# Patient Record
Sex: Female | Born: 1945 | Race: White | Hispanic: No | Smoking: Never smoker
Health system: Southern US, Community
[De-identification: ages and names within clinical notes are randomized; demographics above are authoritative.]

## PROBLEM LIST (undated history)

## (undated) ENCOUNTER — Encounter (HOSPITAL_COMMUNITY): Payer: Self-pay

## (undated) ENCOUNTER — Ambulatory Visit (HOSPITAL_COMMUNITY): Payer: Self-pay | Admitting: Cardiovascular Disease

## (undated) DIAGNOSIS — M8580 Other specified disorders of bone density and structure, unspecified site: Secondary | ICD-10-CM

## (undated) DIAGNOSIS — F329 Major depressive disorder, single episode, unspecified: Secondary | ICD-10-CM

## (undated) DIAGNOSIS — T7840XA Allergy, unspecified, initial encounter: Secondary | ICD-10-CM

## (undated) DIAGNOSIS — E785 Hyperlipidemia, unspecified: Secondary | ICD-10-CM

## (undated) DIAGNOSIS — E213 Hyperparathyroidism, unspecified: Secondary | ICD-10-CM

## (undated) DIAGNOSIS — D649 Anemia, unspecified: Secondary | ICD-10-CM

## (undated) DIAGNOSIS — E559 Vitamin D deficiency, unspecified: Secondary | ICD-10-CM

## (undated) DIAGNOSIS — I2510 Atherosclerotic heart disease of native coronary artery without angina pectoris: Secondary | ICD-10-CM

## (undated) DIAGNOSIS — Z87442 Personal history of urinary calculi: Secondary | ICD-10-CM

## (undated) DIAGNOSIS — I10 Essential (primary) hypertension: Secondary | ICD-10-CM

## (undated) DIAGNOSIS — F319 Bipolar disorder, unspecified: Secondary | ICD-10-CM

## (undated) DIAGNOSIS — F411 Generalized anxiety disorder: Secondary | ICD-10-CM

## (undated) DIAGNOSIS — E079 Disorder of thyroid, unspecified: Secondary | ICD-10-CM

## (undated) DIAGNOSIS — J45909 Unspecified asthma, uncomplicated: Secondary | ICD-10-CM

## (undated) DIAGNOSIS — F32A Depression, unspecified: Secondary | ICD-10-CM

## (undated) DIAGNOSIS — D351 Benign neoplasm of parathyroid gland: Secondary | ICD-10-CM

## (undated) DIAGNOSIS — Z5189 Encounter for other specified aftercare: Secondary | ICD-10-CM

## (undated) DIAGNOSIS — G43909 Migraine, unspecified, not intractable, without status migrainosus: Secondary | ICD-10-CM

## (undated) DIAGNOSIS — I350 Nonrheumatic aortic (valve) stenosis: Secondary | ICD-10-CM

## (undated) DIAGNOSIS — K635 Polyp of colon: Secondary | ICD-10-CM

## (undated) DIAGNOSIS — K219 Gastro-esophageal reflux disease without esophagitis: Secondary | ICD-10-CM

## (undated) DIAGNOSIS — B54 Unspecified malaria: Secondary | ICD-10-CM

## (undated) DIAGNOSIS — J189 Pneumonia, unspecified organism: Secondary | ICD-10-CM

## (undated) DIAGNOSIS — F419 Anxiety disorder, unspecified: Secondary | ICD-10-CM

## (undated) DIAGNOSIS — R0609 Other forms of dyspnea: Secondary | ICD-10-CM

## (undated) DIAGNOSIS — I5189 Other ill-defined heart diseases: Secondary | ICD-10-CM

## (undated) DIAGNOSIS — G3184 Mild cognitive impairment, so stated: Secondary | ICD-10-CM

## (undated) DIAGNOSIS — M349 Systemic sclerosis, unspecified: Secondary | ICD-10-CM

## (undated) DIAGNOSIS — M810 Age-related osteoporosis without current pathological fracture: Secondary | ICD-10-CM

## (undated) HISTORY — DX: Disorder of thyroid, unspecified: E07.9

## (undated) HISTORY — DX: Bipolar disorder, unspecified: F31.9

## (undated) HISTORY — DX: Benign neoplasm of parathyroid gland: D35.1

## (undated) HISTORY — DX: Hyperlipidemia, unspecified: E78.5

## (undated) HISTORY — DX: Nonrheumatic aortic (valve) stenosis: I35.0

## (undated) HISTORY — DX: Generalized anxiety disorder: F41.1

## (undated) HISTORY — DX: Allergy, unspecified, initial encounter: T78.40XA

## (undated) HISTORY — DX: Migraine, unspecified, not intractable, without status migrainosus: G43.909

## (undated) HISTORY — DX: Anemia, unspecified: D64.9

## (undated) HISTORY — DX: Depression, unspecified: F32.A

## (undated) HISTORY — DX: Polyp of colon: K63.5

## (undated) HISTORY — DX: Essential (primary) hypertension: I10

## (undated) HISTORY — DX: Encounter for other specified aftercare: Z51.89

## (undated) HISTORY — DX: Major depressive disorder, single episode, unspecified: F32.9

## (undated) HISTORY — DX: Unspecified asthma, uncomplicated: J45.909

## (undated) HISTORY — DX: Vitamin D deficiency, unspecified: E55.9

## (undated) HISTORY — DX: Atherosclerotic heart disease of native coronary artery without angina pectoris: I25.10

## (undated) HISTORY — DX: Hyperparathyroidism, unspecified: E21.3

## (undated) HISTORY — DX: Other specified disorders of bone density and structure, unspecified site: M85.80

## (undated) HISTORY — DX: Personal history of urinary calculi: Z87.442

## (undated) HISTORY — DX: Mild cognitive impairment of uncertain or unknown etiology: G31.84

## (undated) HISTORY — PX: DILATION AND CURETTAGE OF UTERUS: SHX78

## (undated) HISTORY — DX: Gastro-esophageal reflux disease without esophagitis: K21.9

## (undated) HISTORY — DX: Anxiety disorder, unspecified: F41.9

## (undated) HISTORY — DX: Pneumonia, unspecified organism: J18.9

## (undated) HISTORY — DX: Age-related osteoporosis without current pathological fracture: M81.0

## (undated) HISTORY — DX: Systemic sclerosis, unspecified: M34.9

## (undated) HISTORY — DX: Unspecified malaria: B54

## (undated) HISTORY — DX: Other forms of dyspnea: R06.09

## (undated) HISTORY — DX: Other ill-defined heart diseases: I51.89

## (undated) SURGERY — CARDIAC CATHETERIZATION
Anesthesia: Surgeon Has No Preference | Site: Coronary

## (undated) SURGERY — CARDIAC CATHETERIZATION
Anesthesia: Local | Site: Coronary

## (undated) MED ORDER — METOPROLOL SUCCINATE ER 25 MG OR TB24
EXTENDED_RELEASE_TABLET | ORAL | 0 refills | Status: AC
Start: 2022-01-17 — End: ?

## (undated) MED ORDER — METOPROLOL SUCCINATE ER 50 MG OR TB24
EXTENDED_RELEASE_TABLET | ORAL | 2 refills | Status: AC
Start: 2020-10-20 — End: ?

## (undated) MED ORDER — METOPROLOL SUCCINATE ER 50 MG OR TB24
EXTENDED_RELEASE_TABLET | ORAL | 0 refills | Status: AC
Start: 2022-01-17 — End: ?

## (undated) MED ORDER — AMLODIPINE BESYLATE 10 MG OR TABS
ORAL_TABLET | ORAL | 0 refills | Status: AC
Start: 2022-01-17 — End: ?

## (undated) MED ORDER — AMLODIPINE BESYLATE 10 MG OR TABS
ORAL_TABLET | ORAL | 0 refills | Status: AC
Start: 2022-01-01 — End: ?

## (undated) MED ORDER — PANTOPRAZOLE SODIUM 40 MG OR TBEC
DELAYED_RELEASE_TABLET | ORAL | 0 refills | Status: AC
Start: 2022-03-09 — End: ?

## (undated) MED ORDER — NITROGLYCERIN 0.4 MG SL SUBL
SUBLINGUAL_TABLET | SUBLINGUAL | 0 refills | Status: AC
Start: 2022-01-17 — End: ?

## (undated) MED ORDER — ISOSORBIDE MONONITRATE ER 60 MG OR TB24
EXTENDED_RELEASE_TABLET | ORAL | 0 refills | Status: AC
Start: 2022-01-17 — End: ?

---

## 1971-06-12 DIAGNOSIS — S36113A Laceration of liver, unspecified degree, initial encounter: Secondary | ICD-10-CM

## 1971-06-12 HISTORY — PX: LIVER REPAIR: SHX6734

## 1971-06-12 HISTORY — DX: Laceration of liver, unspecified degree, initial encounter: S36.113A

## 1971-06-12 HISTORY — PX: PR MGMT LVR HEMRRG SMPL SUTR LVR WND/INJ: 47350

## 2004-06-11 DIAGNOSIS — Z951 Presence of aortocoronary bypass graft: Secondary | ICD-10-CM | POA: Insufficient documentation

## 2004-06-11 DIAGNOSIS — I2510 Atherosclerotic heart disease of native coronary artery without angina pectoris: Secondary | ICD-10-CM

## 2004-06-11 HISTORY — DX: Atherosclerotic heart disease of native coronary artery without angina pectoris: I25.10

## 2004-06-11 HISTORY — PX: CORONARY ARTERY BYPASS GRAFT: SHX141

## 2005-04-11 HISTORY — PX: OTHER SURGICAL HISTORY: SHX169

## 2005-05-01 HISTORY — PX: LEFT HEART CATH AND CORONARY ANGIOGRAPHY: CATH118249

## 2005-05-01 HISTORY — PX: CORONARY ANGIOGRAM: CATH173

## 2005-05-14 ENCOUNTER — Encounter: Payer: Self-pay | Admitting: Cardiology

## 2005-05-16 ENCOUNTER — Encounter: Payer: Self-pay | Admitting: Cardiology

## 2005-05-22 ENCOUNTER — Encounter: Payer: Self-pay | Admitting: Cardiology

## 2014-06-11 HISTORY — PX: UTERINE FIBROID SURGERY: SHX826

## 2015-06-12 DIAGNOSIS — E213 Hyperparathyroidism, unspecified: Secondary | ICD-10-CM

## 2015-06-12 DIAGNOSIS — S92309A Fracture of unspecified metatarsal bone(s), unspecified foot, initial encounter for closed fracture: Secondary | ICD-10-CM

## 2015-06-12 DIAGNOSIS — Z8781 Personal history of (healed) traumatic fracture: Secondary | ICD-10-CM | POA: Insufficient documentation

## 2015-06-12 HISTORY — DX: Hyperparathyroidism, unspecified: E21.3

## 2015-06-12 HISTORY — DX: Fracture of unspecified metatarsal bone(s), unspecified foot, initial encounter for closed fracture: S92.309A

## 2015-12-10 DIAGNOSIS — I350 Nonrheumatic aortic (valve) stenosis: Secondary | ICD-10-CM

## 2015-12-10 DIAGNOSIS — Z8639 Personal history of other endocrine, nutritional and metabolic disease: Secondary | ICD-10-CM | POA: Insufficient documentation

## 2015-12-10 DIAGNOSIS — Z86018 Personal history of other benign neoplasm: Secondary | ICD-10-CM | POA: Insufficient documentation

## 2015-12-10 HISTORY — DX: Nonrheumatic aortic (valve) stenosis: I35.0

## 2015-12-10 HISTORY — PX: TRANSTHORACIC ECHOCARDIOGRAM: SHX275

## 2016-01-05 DIAGNOSIS — F062 Psychotic disorder with delusions due to known physiological condition: Secondary | ICD-10-CM | POA: Insufficient documentation

## 2016-01-05 DIAGNOSIS — I25119 Atherosclerotic heart disease of native coronary artery with unspecified angina pectoris: Secondary | ICD-10-CM | POA: Insufficient documentation

## 2016-01-05 DIAGNOSIS — I350 Nonrheumatic aortic (valve) stenosis: Secondary | ICD-10-CM | POA: Insufficient documentation

## 2016-01-05 DIAGNOSIS — F3170 Bipolar disorder, currently in remission, most recent episode unspecified: Secondary | ICD-10-CM | POA: Insufficient documentation

## 2016-01-06 DIAGNOSIS — D351 Benign neoplasm of parathyroid gland: Secondary | ICD-10-CM

## 2016-01-06 HISTORY — DX: Benign neoplasm of parathyroid gland: D35.1

## 2016-01-08 DIAGNOSIS — I350 Nonrheumatic aortic (valve) stenosis: Secondary | ICD-10-CM

## 2016-01-08 DIAGNOSIS — I351 Nonrheumatic aortic (valve) insufficiency: Secondary | ICD-10-CM | POA: Insufficient documentation

## 2016-01-08 HISTORY — DX: Nonrheumatic aortic (valve) insufficiency: I35.1

## 2016-01-08 HISTORY — DX: Nonrheumatic aortic (valve) stenosis: I35.0

## 2016-01-09 DIAGNOSIS — G3184 Mild cognitive impairment, so stated: Secondary | ICD-10-CM | POA: Insufficient documentation

## 2016-01-09 DIAGNOSIS — D351 Benign neoplasm of parathyroid gland: Secondary | ICD-10-CM

## 2016-01-09 DIAGNOSIS — M349 Systemic sclerosis, unspecified: Secondary | ICD-10-CM | POA: Insufficient documentation

## 2016-01-09 DIAGNOSIS — N201 Calculus of ureter: Secondary | ICD-10-CM

## 2016-01-09 HISTORY — DX: Benign neoplasm of parathyroid gland: D35.1

## 2016-01-09 HISTORY — DX: Calculus of ureter: N20.1

## 2016-01-12 ENCOUNTER — Encounter (HOSPITAL_BASED_OUTPATIENT_CLINIC_OR_DEPARTMENT_OTHER): Payer: Medicare Other | Admitting: Otolaryngology

## 2016-01-25 DIAGNOSIS — Z8639 Personal history of other endocrine, nutritional and metabolic disease: Secondary | ICD-10-CM | POA: Insufficient documentation

## 2016-01-27 DIAGNOSIS — Z87442 Personal history of urinary calculi: Secondary | ICD-10-CM | POA: Insufficient documentation

## 2016-01-27 DIAGNOSIS — I351 Nonrheumatic aortic (valve) insufficiency: Secondary | ICD-10-CM | POA: Insufficient documentation

## 2016-01-27 DIAGNOSIS — E559 Vitamin D deficiency, unspecified: Secondary | ICD-10-CM | POA: Insufficient documentation

## 2016-01-27 HISTORY — DX: Vitamin D deficiency, unspecified: E55.9

## 2016-02-07 DIAGNOSIS — E892 Postprocedural hypoparathyroidism: Secondary | ICD-10-CM | POA: Insufficient documentation

## 2016-02-07 DIAGNOSIS — Z9889 Other specified postprocedural states: Secondary | ICD-10-CM | POA: Insufficient documentation

## 2016-02-07 HISTORY — PX: PARATHYROIDECTOMY: SHX19

## 2016-02-07 HISTORY — PX: PARATHYROIDECTOMY: SHX5138

## 2016-04-11 HISTORY — PX: NM MYOVIEW LTD: HXRAD82

## 2016-06-18 DIAGNOSIS — I2510 Atherosclerotic heart disease of native coronary artery without angina pectoris: Secondary | ICD-10-CM | POA: Diagnosis not present

## 2016-06-18 DIAGNOSIS — I10 Essential (primary) hypertension: Secondary | ICD-10-CM | POA: Diagnosis not present

## 2016-06-18 DIAGNOSIS — I350 Nonrheumatic aortic (valve) stenosis: Secondary | ICD-10-CM | POA: Diagnosis not present

## 2016-06-18 DIAGNOSIS — I351 Nonrheumatic aortic (valve) insufficiency: Secondary | ICD-10-CM | POA: Diagnosis not present

## 2016-06-18 DIAGNOSIS — E785 Hyperlipidemia, unspecified: Secondary | ICD-10-CM | POA: Diagnosis not present

## 2016-08-17 DIAGNOSIS — E785 Hyperlipidemia, unspecified: Secondary | ICD-10-CM | POA: Diagnosis not present

## 2016-08-17 DIAGNOSIS — E892 Postprocedural hypoparathyroidism: Secondary | ICD-10-CM | POA: Diagnosis not present

## 2016-08-17 DIAGNOSIS — Z8639 Personal history of other endocrine, nutritional and metabolic disease: Secondary | ICD-10-CM | POA: Diagnosis not present

## 2016-09-27 DIAGNOSIS — M349 Systemic sclerosis, unspecified: Secondary | ICD-10-CM | POA: Diagnosis not present

## 2016-09-27 DIAGNOSIS — I10 Essential (primary) hypertension: Secondary | ICD-10-CM | POA: Diagnosis not present

## 2016-09-27 DIAGNOSIS — Z5181 Encounter for therapeutic drug level monitoring: Secondary | ICD-10-CM | POA: Diagnosis not present

## 2016-09-27 DIAGNOSIS — R69 Illness, unspecified: Secondary | ICD-10-CM | POA: Diagnosis not present

## 2016-09-27 DIAGNOSIS — E892 Postprocedural hypoparathyroidism: Secondary | ICD-10-CM | POA: Diagnosis not present

## 2016-09-27 DIAGNOSIS — I2510 Atherosclerotic heart disease of native coronary artery without angina pectoris: Secondary | ICD-10-CM | POA: Diagnosis not present

## 2016-10-03 DIAGNOSIS — I2510 Atherosclerotic heart disease of native coronary artery without angina pectoris: Secondary | ICD-10-CM | POA: Diagnosis not present

## 2016-10-03 DIAGNOSIS — Z0000 Encounter for general adult medical examination without abnormal findings: Secondary | ICD-10-CM | POA: Diagnosis not present

## 2016-10-03 DIAGNOSIS — Z1211 Encounter for screening for malignant neoplasm of colon: Secondary | ICD-10-CM | POA: Diagnosis not present

## 2016-10-03 DIAGNOSIS — Z1231 Encounter for screening mammogram for malignant neoplasm of breast: Secondary | ICD-10-CM | POA: Diagnosis not present

## 2016-10-03 DIAGNOSIS — R69 Illness, unspecified: Secondary | ICD-10-CM | POA: Diagnosis not present

## 2016-10-03 DIAGNOSIS — I10 Essential (primary) hypertension: Secondary | ICD-10-CM | POA: Diagnosis not present

## 2016-10-03 DIAGNOSIS — S82891G Other fracture of right lower leg, subsequent encounter for closed fracture with delayed healing: Secondary | ICD-10-CM | POA: Diagnosis not present

## 2016-10-15 ENCOUNTER — Other Ambulatory Visit: Payer: Self-pay | Admitting: Internal Medicine

## 2016-10-15 DIAGNOSIS — R5381 Other malaise: Secondary | ICD-10-CM

## 2016-10-15 DIAGNOSIS — Z1231 Encounter for screening mammogram for malignant neoplasm of breast: Secondary | ICD-10-CM

## 2016-10-22 ENCOUNTER — Other Ambulatory Visit: Payer: Self-pay | Admitting: Internal Medicine

## 2016-10-22 DIAGNOSIS — E213 Hyperparathyroidism, unspecified: Secondary | ICD-10-CM

## 2016-11-16 ENCOUNTER — Ambulatory Visit (INDEPENDENT_AMBULATORY_CARE_PROVIDER_SITE_OTHER)
Admission: RE | Admit: 2016-11-16 | Discharge: 2016-11-16 | Disposition: A | Discharge status: Home / Self Care | Payer: Medicare HMO | Source: Ambulatory Visit | Attending: Nurse Practitioner | Admitting: Nurse Practitioner

## 2016-11-16 ENCOUNTER — Encounter: Payer: Self-pay | Admitting: Nurse Practitioner

## 2016-11-16 ENCOUNTER — Ambulatory Visit (INDEPENDENT_AMBULATORY_CARE_PROVIDER_SITE_OTHER): Payer: Medicare HMO | Admitting: Nurse Practitioner

## 2016-11-16 VITALS — BP 142/68 | HR 61 | Temp 98.2°F | Ht 61.0 in | Wt 219.0 lb

## 2016-11-16 DIAGNOSIS — R0602 Shortness of breath: Secondary | ICD-10-CM

## 2016-11-16 DIAGNOSIS — R059 Cough, unspecified: Secondary | ICD-10-CM

## 2016-11-16 DIAGNOSIS — Z9089 Acquired absence of other organs: Secondary | ICD-10-CM | POA: Insufficient documentation

## 2016-11-16 DIAGNOSIS — Z9889 Other specified postprocedural states: Secondary | ICD-10-CM | POA: Insufficient documentation

## 2016-11-16 DIAGNOSIS — J0140 Acute pansinusitis, unspecified: Secondary | ICD-10-CM | POA: Diagnosis not present

## 2016-11-16 DIAGNOSIS — R05 Cough: Secondary | ICD-10-CM

## 2016-11-16 DIAGNOSIS — I10 Essential (primary) hypertension: Secondary | ICD-10-CM | POA: Insufficient documentation

## 2016-11-16 DIAGNOSIS — E785 Hyperlipidemia, unspecified: Secondary | ICD-10-CM | POA: Insufficient documentation

## 2016-11-16 DIAGNOSIS — E892 Postprocedural hypoparathyroidism: Secondary | ICD-10-CM | POA: Insufficient documentation

## 2016-11-16 MED ORDER — BENZONATATE 100 MG PO CAPS: 100.0000 mg | ORAL_CAPSULE | Freq: Three times a day (TID) | ORAL | 0 refills | Status: DC | PRN

## 2016-11-16 MED ORDER — ALBUTEROL SULFATE HFA 108 (90 BASE) MCG/ACT IN AERS: 2.0000 | INHALATION_SPRAY | Freq: Four times a day (QID) | RESPIRATORY_TRACT | 0 refills | Status: DC | PRN

## 2016-11-16 MED ORDER — FLUTICASONE PROPIONATE 50 MCG/ACT NA SUSP: 2.0000 | Freq: Every day | NASAL | 0 refills | Status: DC

## 2016-11-16 MED ORDER — DM-GUAIFENESIN ER 30-600 MG PO TB12: 1.0000 | ORAL_TABLET | Freq: Two times a day (BID) | ORAL | 0 refills | Status: DC | PRN

## 2016-11-16 NOTE — Patient Instructions (Addendum)
Please go to basement for blood draw. You will be called with results.  URI Instructions: Flonase and Afrin use: apply 1spray of afrin in each nare, wait 69mins, then apply 2sprays of flonase in each nare. Use both nasal spray consecutively x 3days, then flonase only for at least 14days.  Encourage adequate oral hydration.  Use over-the-counter  "cold" medicines  such as "Tylenol cold" , "Advil cold",  "Mucinex" or" Mucinex D"  for cough and congestion.  Avoid decongestants if you have high blood pressure. Use" Delsym" or" Robitussin" cough syrup varietis for cough.  You can use plain "Tylenol" or "Advi"l for fever, chills and achyness.

## 2016-11-16 NOTE — Progress Notes (Signed)
Subjective:  Patient ID: Cindy Reyes, female    DOB: August 17, 1945  Age: 71 y.o. MRN: [id number]  CC: Establish Care (new pt/coughing,wheezing,SOB for 2 wks. pt had parathyroid surgery FYI)   Cough  This is a new problem. The current episode started 1 to 4 weeks ago. The problem has been waxing and waning. The cough is productive of purulent sputum and productive of sputum. Associated symptoms include chest pain, chills, a fever, nasal congestion, postnasal drip, rhinorrhea, a sore throat, shortness of breath and wheezing. The symptoms are aggravated by lying down and cold air. She has tried OTC cough suppressant for the symptoms. The treatment provided no relief. Her past medical history is significant for environmental allergies.   No outpatient prescriptions prior to visit.   No facility-administered medications prior to visit.     ROS See HPI  Objective:  BP (!) 142/68   Pulse 61   Temp 98.2 F (36.8 C)   Ht 5\' 1"  (1.549 m)   Wt 219 lb (99.3 kg)   SpO2 98%   BMI 41.38 kg/m   BP Readings from Last 3 Encounters:  11/16/16 (!) 142/68    Wt Readings from Last 3 Encounters:  11/16/16 219 lb (99.3 kg)    Physical Exam  Constitutional: She is oriented to person, place, and time.  HENT:  Right Ear: Tympanic membrane, external ear and ear canal normal.  Left Ear: Tympanic membrane, external ear and ear canal normal.  Nose: Mucosal edema and rhinorrhea present. Right sinus exhibits maxillary sinus tenderness. Right sinus exhibits no frontal sinus tenderness. Left sinus exhibits maxillary sinus tenderness. Left sinus exhibits no frontal sinus tenderness.  Mouth/Throat: Uvula is midline. No trismus in the jaw. Posterior oropharyngeal erythema present. No oropharyngeal exudate.  Eyes: No scleral icterus.  Neck: Normal range of motion. Neck supple.  Cardiovascular: Normal rate and normal heart sounds.   Pulmonary/Chest: Effort normal and breath sounds normal.  Musculoskeletal:  She exhibits no edema.  Lymphadenopathy:    She has cervical adenopathy.  Neurological: She is alert and oriented to person, place, and time.  Vitals reviewed.   No results found for: WBC, HGB, HCT, PLT, GLUCOSE, CHOL, TRIG, HDL, LDLDIRECT, LDLCALC, ALT, AST, NA, K, CL, CREATININE, BUN, CO2, TSH, PSA, INR, GLUF, HGBA1C, MICROALBUR  Patient was never admitted.  Assessment & Plan:   Cindy Reyes was seen today for establish care.  Diagnoses and all orders for this visit:  Cough -     DG Chest 2 View; Future -     benzonatate (TESSALON) 100 MG capsule; Take 1 capsule (100 mg total) by mouth 3 (three) times daily as needed for cough. -     dextromethorphan-guaiFENesin (MUCINEX DM) 30-600 MG 12hr tablet; Take 1 tablet by mouth 2 (two) times daily as needed for cough. -     albuterol (PROVENTIL HFA;VENTOLIN HFA) 108 (90 Base) MCG/ACT inhaler; Inhale 2 puffs into the lungs every 6 (six) hours as needed for wheezing or shortness of breath.  SOB (shortness of breath) -     DG Chest 2 View; Future -     albuterol (PROVENTIL HFA;VENTOLIN HFA) 108 (90 Base) MCG/ACT inhaler; Inhale 2 puffs into the lungs every 6 (six) hours as needed for wheezing or shortness of breath.  Acute non-recurrent pansinusitis -     benzonatate (TESSALON) 100 MG capsule; Take 1 capsule (100 mg total) by mouth 3 (three) times daily as needed for cough. -     dextromethorphan-guaiFENesin [facility] DM)  30-600 MG 12hr tablet; Take 1 tablet by mouth 2 (two) times daily as needed for cough. -     fluticasone (FLONASE) 50 MCG/ACT nasal spray; Place 2 sprays into both nostrils daily.   I am having Cindy Reyes start on benzonatate, dextromethorphan-guaiFENesin, fluticasone, and albuterol. I am also having her maintain her aspirin EC, rosuvastatin, amLODipine, and metoprolol succinate.  Meds ordered this encounter  Medications  . aspirin EC 81 MG tablet    Sig: Take 81 mg by mouth.  . rosuvastatin (CRESTOR) 40 MG tablet    Sig:  Take 40 mg by mouth daily.    Refill:  9  . amLODipine (NORVASC) 10 MG tablet    Sig: Take 10 mg by mouth daily.    Refill:  6  . metoprolol succinate (TOPROL-XL) 25 MG 24 hr tablet    Sig: Take 25 mg by mouth daily.  . benzonatate (TESSALON) 100 MG capsule    Sig: Take 1 capsule (100 mg total) by mouth 3 (three) times daily as needed for cough.    Dispense:  20 capsule    Refill:  0    Order Specific Question:   Supervising Provider    Answer:   Cassandria Anger [[id number]]  . dextromethorphan-guaiFENesin (MUCINEX DM) 30-600 MG 12hr tablet    Sig: Take 1 tablet by mouth 2 (two) times daily as needed for cough.    Dispense:  14 tablet    Refill:  0    Order Specific Question:   Supervising Provider    Answer:   Cassandria Anger [[id number]]  . fluticasone (FLONASE) 50 MCG/ACT nasal spray    Sig: Place 2 sprays into both nostrils daily.    Dispense:  16 g    Refill:  0    Order Specific Question:   Supervising Provider    Answer:   Cassandria Anger [[id number]]  . albuterol (PROVENTIL HFA;VENTOLIN HFA) 108 (90 Base) MCG/ACT inhaler    Sig: Inhale 2 puffs into the lungs every 6 (six) hours as needed for wheezing or shortness of breath.    Dispense:  1 Inhaler    Refill:  0    Order Specific Question:   Supervising Provider    Answer:   Cassandria Anger [[id number]]    Follow-up: Return in about 6 months (around 05/18/2017) for HTN (fasting, for labs).  Wilfred Lacy, NP

## 2016-11-26 DIAGNOSIS — M25512 Pain in left shoulder: Secondary | ICD-10-CM | POA: Insufficient documentation

## 2016-11-26 DIAGNOSIS — Z79899 Other long term (current) drug therapy: Secondary | ICD-10-CM | POA: Diagnosis not present

## 2016-11-26 DIAGNOSIS — M25519 Pain in unspecified shoulder: Secondary | ICD-10-CM | POA: Diagnosis not present

## 2016-11-26 DIAGNOSIS — I10 Essential (primary) hypertension: Secondary | ICD-10-CM | POA: Diagnosis not present

## 2016-11-26 DIAGNOSIS — M25551 Pain in right hip: Secondary | ICD-10-CM | POA: Diagnosis not present

## 2016-11-26 DIAGNOSIS — M160 Bilateral primary osteoarthritis of hip: Secondary | ICD-10-CM | POA: Diagnosis not present

## 2016-11-26 DIAGNOSIS — Z7982 Long term (current) use of aspirin: Secondary | ICD-10-CM | POA: Diagnosis not present

## 2016-11-26 DIAGNOSIS — Z880 Allergy status to penicillin: Secondary | ICD-10-CM | POA: Diagnosis not present

## 2016-11-26 DIAGNOSIS — M25511 Pain in right shoulder: Secondary | ICD-10-CM | POA: Insufficient documentation

## 2016-11-26 DIAGNOSIS — E213 Hyperparathyroidism, unspecified: Secondary | ICD-10-CM | POA: Diagnosis not present

## 2016-11-26 DIAGNOSIS — M25552 Pain in left hip: Secondary | ICD-10-CM | POA: Diagnosis not present

## 2016-11-26 DIAGNOSIS — Z951 Presence of aortocoronary bypass graft: Secondary | ICD-10-CM | POA: Diagnosis not present

## 2016-11-26 DIAGNOSIS — I2510 Atherosclerotic heart disease of native coronary artery without angina pectoris: Secondary | ICD-10-CM | POA: Diagnosis not present

## 2016-11-28 ENCOUNTER — Ambulatory Visit
Admission: RE | Admit: 2016-11-28 | Discharge: 2016-11-28 | Disposition: A | Discharge status: Home / Self Care | Payer: Medicare HMO | Source: Ambulatory Visit | Attending: Internal Medicine | Admitting: Internal Medicine

## 2016-11-28 DIAGNOSIS — Z780 Asymptomatic menopausal state: Secondary | ICD-10-CM | POA: Diagnosis not present

## 2016-11-28 DIAGNOSIS — Z1231 Encounter for screening mammogram for malignant neoplasm of breast: Secondary | ICD-10-CM | POA: Diagnosis not present

## 2016-11-28 DIAGNOSIS — M8589 Other specified disorders of bone density and structure, multiple sites: Secondary | ICD-10-CM | POA: Diagnosis not present

## 2016-11-28 DIAGNOSIS — E213 Hyperparathyroidism, unspecified: Secondary | ICD-10-CM

## 2016-12-31 ENCOUNTER — Telehealth: Payer: Self-pay | Admitting: Nurse Practitioner

## 2016-12-31 DIAGNOSIS — J0140 Acute pansinusitis, unspecified: Secondary | ICD-10-CM

## 2016-12-31 MED ORDER — FLUTICASONE PROPIONATE 50 MCG/ACT NA SUSP: 2.0000 | Freq: Every day | NASAL | 1 refills | Status: DC

## 2016-12-31 NOTE — Telephone Encounter (Signed)
rx sent

## 2017-03-06 DIAGNOSIS — R69 Illness, unspecified: Secondary | ICD-10-CM | POA: Diagnosis not present

## 2017-05-17 ENCOUNTER — Ambulatory Visit: Payer: Medicare HMO | Admitting: Nurse Practitioner

## 2017-06-06 ENCOUNTER — Encounter: Payer: Self-pay | Admitting: Gastroenterology

## 2017-06-30 ENCOUNTER — Encounter (HOSPITAL_COMMUNITY): Payer: Self-pay | Admitting: *Deleted

## 2017-06-30 ENCOUNTER — Other Ambulatory Visit: Payer: Self-pay

## 2017-06-30 ENCOUNTER — Emergency Department (HOSPITAL_COMMUNITY)
Admission: EM | Admit: 2017-06-30 | Discharge: 2017-06-30 | Disposition: A | Discharge status: Home / Self Care | Payer: Medicare HMO | Attending: Emergency Medicine | Admitting: Emergency Medicine

## 2017-06-30 DIAGNOSIS — Z91040 Latex allergy status: Secondary | ICD-10-CM | POA: Diagnosis not present

## 2017-06-30 DIAGNOSIS — R440 Auditory hallucinations: Secondary | ICD-10-CM | POA: Insufficient documentation

## 2017-06-30 DIAGNOSIS — Z79899 Other long term (current) drug therapy: Secondary | ICD-10-CM | POA: Diagnosis not present

## 2017-06-30 DIAGNOSIS — J45909 Unspecified asthma, uncomplicated: Secondary | ICD-10-CM | POA: Diagnosis not present

## 2017-06-30 DIAGNOSIS — R69 Illness, unspecified: Secondary | ICD-10-CM | POA: Diagnosis not present

## 2017-06-30 DIAGNOSIS — F329 Major depressive disorder, single episode, unspecified: Secondary | ICD-10-CM | POA: Insufficient documentation

## 2017-06-30 DIAGNOSIS — I10 Essential (primary) hypertension: Secondary | ICD-10-CM | POA: Diagnosis not present

## 2017-06-30 DIAGNOSIS — I2510 Atherosclerotic heart disease of native coronary artery without angina pectoris: Secondary | ICD-10-CM | POA: Diagnosis not present

## 2017-06-30 LAB — BASIC METABOLIC PANEL
Anion gap: 13 (ref 5–15)
BUN: 16 mg/dL (ref 6–20)
CO2: 20 mmol/L — ABNORMAL LOW (ref 22–32)
Calcium: 9.5 mg/dL (ref 8.9–10.3)
Chloride: 109 mmol/L (ref 101–111)
Creatinine, Ser: 0.99 mg/dL (ref 0.44–1.00)
GFR calc Af Amer: >60 mL/min (ref >60)
GFR calc non Af Amer: 56 mL/min — ABNORMAL LOW (ref >60)
Glucose, Bld: 97 mg/dL (ref 65–99)
Potassium: 3.5 mmol/L (ref 3.5–5.1)
Sodium: 142 mmol/L (ref 135–145)

## 2017-06-30 LAB — CBC
HCT: 47.4 % — ABNORMAL HIGH (ref 36.0–46.0)
Hemoglobin: 16.4 g/dL — ABNORMAL HIGH (ref 12.0–15.0)
MCH: 31.3 pg (ref 26.0–34.0)
MCHC: 34.6 g/dL (ref 30.0–36.0)
MCV: 90.5 fL (ref 78.0–100.0)
Platelets: 224 10*3/uL (ref 150–400)
RBC: 5.24 MIL/uL — ABNORMAL HIGH (ref 3.87–5.11)
RDW: 14.5 % (ref 11.5–15.5)
WBC: 6.6 10*3/uL (ref 4.0–10.5)

## 2017-06-30 LAB — URINALYSIS, ROUTINE W REFLEX MICROSCOPIC
BILIRUBIN URINE: NEGATIVE
Glucose, UA: NEGATIVE mg/dL
Ketones, ur: 20 mg/dL — AB
Nitrite: NEGATIVE
PH: 5 (ref 5.0–8.0)
Protein, ur: 100 mg/dL — AB
SPECIFIC GRAVITY, URINE: 1.021 (ref 1.005–1.030)

## 2017-06-30 NOTE — ED Provider Notes (Signed)
The pt has had hallucinations intermittently when she is at home - they don't occur when she is out and about - and she has no other physical sx at this time - she has no suicidal thoughts.  No depression On exam she is well-appearing in no distress, speaks in full sentences with normal coordination and memory, soft abdomen, clear lungs, no edema.  No urinary symptoms or abd ttp.  Reviewed labs, culture urine, discussed this with patient and friend at the bedside with patient's consent, stable for discharge and outpt f/u with mental health provider.  Medical screening examination/treatment/procedure(s) were conducted as a shared visit with non-physician practitioner(s) and myself.  I personally evaluated the patient during the encounter.  Clinical Impression:   Final diagnoses:  Auditory hallucination         Noemi Chapel, MD 07/02/17 [phone]

## 2017-06-30 NOTE — ED Notes (Signed)
Pt and family verbalized understanding of discharge instructions. Pt unable to sign due to computer error.
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
## 2017-06-30 NOTE — Discharge Instructions (Signed)
As we discussed, you need to follow-up with the therapy resources.  Call and arrange for an appointment.   You also need to establish a primary care doctor.  I have provided a referral to the [facility] wellness clinic that you can follow-up with.  At that appointment, they can evaluate the parathyroid hormone that we drew today.  Return to the Emergency Department for any worsening hallucinations, thoughts of wanting to hurt or kill yourself or anybody else, abdominal pain, chest pain, nausea/vomiting or any other worsening or concerning symptoms.

## 2017-06-30 NOTE — ED Triage Notes (Signed)
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  She had one half of her parathyroid removed and had this happen before with same symptoms and it was related to high calcium.  Pt states she is hallucinating and creating things that are not true.  Pt reports she is not sleeping, throat is sore, and having tension headache.  Pt is here with someone.

## 2017-07-01 LAB — PTH, INTACT AND CALCIUM
Calcium, Total (PTH): 9.4 mg/dL (ref 8.7–10.3)
PTH: 36 pg/mL (ref 15–65)

## 2017-07-02 ENCOUNTER — Telehealth: Payer: Self-pay | Admitting: Nurse Practitioner

## 2017-07-02 LAB — URINE CULTURE

## 2017-07-02 NOTE — Telephone Encounter (Signed)
ok 

## 2017-07-02 NOTE — Telephone Encounter (Signed)
Patient would like to transfer from Wilfred Lacy, NP to Dr. Juleen China due to location. Out of courtesy for both providers I will await approval before make the transfer complete and contacting patient to update. Please respond with an approval or denial of transfer.    Copied from [place]. Topic: Appointment Scheduling - Scheduling Inquiry for Clinic >> Jul 02, 2017 12:44 PM Cindy Reyes wrote: Reason for CRM: pt called in and would like to transfer from [place] to [place].  She would like to go closer to home and would like a DO  .   Is this ok?  Please forward to [place] for sch if ok'd?  Best number  [phone]

## 2017-07-02 NOTE — Telephone Encounter (Signed)
"  She would like a DO"   Before I agree, I need to make sure that the patient understands that I practice the same way any other doctor practices. I follow evidence-based guidelines, encourage vaccinations, and will recommend medications when appropriate.   After that conversation and her acknowledgement that she understands, I am okay with the transfer.

## 2017-07-03 NOTE — Telephone Encounter (Signed)
Patient is scheduled for NP appointment 08/16/17 and voiced understanding of Cindy Reyes's Practices.

## 2017-07-03 NOTE — Telephone Encounter (Signed)
Please call patient to advise of Dr. Alcario Drought note and schedule a transfer appointment if she agrees.

## 2017-07-10 ENCOUNTER — Ambulatory Visit (AMBULATORY_SURGERY_CENTER): Payer: Self-pay | Admitting: *Deleted

## 2017-07-10 ENCOUNTER — Telehealth: Payer: Self-pay | Admitting: *Deleted

## 2017-07-10 ENCOUNTER — Encounter: Payer: Self-pay | Admitting: Gastroenterology

## 2017-07-10 ENCOUNTER — Other Ambulatory Visit: Payer: Self-pay

## 2017-07-10 VITALS — Ht 61.0 in | Wt 214.0 lb

## 2017-07-10 DIAGNOSIS — Z1211 Encounter for screening for malignant neoplasm of colon: Secondary | ICD-10-CM

## 2017-07-10 MED ORDER — PEG 3350-KCL-NA BICARB-NACL 420 G PO SOLR: 4000.0000 mL | Freq: Once | ORAL | 0 refills | Status: AC

## 2017-07-10 NOTE — Telephone Encounter (Signed)
John,  I saw this lady today in [place] . Please look at her cardiology reports in care everywhere for me. She has a hx of CABG. Mild aortic stenosis and she has an EF that is normal, but please review. She kept saying her heart isnt well - she has not seen cardio in [place] but the reports I could find were in 2017 from [place].  They look good to me.  Just want to be sure she is ok for the [place] .   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 She was stating she would rather have the " twilight sleep medicine ".  I explained to her the differences and told her we use propofol and the reasons why.   She wants to discuss this with you or someone she stated.  Thank you for your time, [name]

## 2017-07-10 NOTE — Progress Notes (Signed)
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

## 2017-07-11 ENCOUNTER — Ambulatory Visit: Payer: Medicare HMO | Attending: Family Medicine | Admitting: Licensed Clinical Social Worker

## 2017-07-11 ENCOUNTER — Encounter: Payer: Self-pay | Admitting: Family Medicine

## 2017-07-11 ENCOUNTER — Ambulatory Visit: Payer: Medicare HMO | Attending: Family Medicine | Admitting: Family Medicine

## 2017-07-11 VITALS — BP 126/79 | HR 78 | Temp 97.4°F | Ht 61.0 in | Wt 215.2 lb

## 2017-07-11 DIAGNOSIS — I10 Essential (primary) hypertension: Secondary | ICD-10-CM | POA: Diagnosis not present

## 2017-07-11 DIAGNOSIS — Z23 Encounter for immunization: Secondary | ICD-10-CM | POA: Diagnosis not present

## 2017-07-11 DIAGNOSIS — R399 Unspecified symptoms and signs involving the genitourinary system: Secondary | ICD-10-CM

## 2017-07-11 DIAGNOSIS — E049 Nontoxic goiter, unspecified: Secondary | ICD-10-CM

## 2017-07-11 DIAGNOSIS — Z79899 Other long term (current) drug therapy: Secondary | ICD-10-CM | POA: Insufficient documentation

## 2017-07-11 DIAGNOSIS — Z8659 Personal history of other mental and behavioral disorders: Secondary | ICD-10-CM | POA: Diagnosis not present

## 2017-07-11 DIAGNOSIS — E213 Hyperparathyroidism, unspecified: Secondary | ICD-10-CM

## 2017-07-11 DIAGNOSIS — Z818 Family history of other mental and behavioral disorders: Secondary | ICD-10-CM

## 2017-07-11 DIAGNOSIS — F419 Anxiety disorder, unspecified: Secondary | ICD-10-CM

## 2017-07-11 DIAGNOSIS — R0981 Nasal congestion: Secondary | ICD-10-CM

## 2017-07-11 DIAGNOSIS — R69 Illness, unspecified: Secondary | ICD-10-CM | POA: Diagnosis not present

## 2017-07-11 LAB — POCT URINALYSIS DIPSTICK
Glucose, UA: NEGATIVE
Ketones, UA: 15
NITRITE UA: NEGATIVE
PROTEIN UA: 30
SPEC GRAV UA: 1.015 (ref 1.010–1.025)
Urobilinogen, UA: 0.2 E.U./dL
pH, UA: 6.5 (ref 5.0–8.0)

## 2017-07-11 MED ORDER — FLUTICASONE PROPIONATE 50 MCG/ACT NA SUSP: 2.0000 | Freq: Every day | NASAL | 0 refills | Status: DC
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
## 2017-07-11 NOTE — Telephone Encounter (Signed)
noted 

## 2017-07-11 NOTE — Telephone Encounter (Signed)
Cindy Reyes,  I spoke with Cindy Reyes discussed the propofol anesthetic, and allayed her fears and concerns. She is cleared for anesthetic care at [facility].  Thanks,  Jenny Reichmann
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
## 2017-07-12 LAB — THYROID PANEL WITH TSH
Free Thyroxine Index: 1.6 (ref 1.2–4.9)
T3 Uptake Ratio: 27 % (ref 24–39)
T4 TOTAL: 5.8 ug/dL (ref 4.5–12.0)
TSH: 3.27 u[IU]/mL (ref 0.450–4.500)

## 2017-07-13 LAB — URINE CULTURE: Organism ID, Bacteria: NO GROWTH

## 2017-07-16 ENCOUNTER — Telehealth: Payer: Self-pay

## 2017-07-16 NOTE — Telephone Encounter (Signed)
-----   Message from Alfonse Spruce, [place] sent at 07/15/2017  1:12 PM EST ----- Thyroid function normal Urine culture was negative for bacteria.

## 2017-07-16 NOTE — Telephone Encounter (Signed)
CMA called patient to inform on lab results.  Patient understood.  
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
## 2017-07-22 ENCOUNTER — Telehealth: Payer: Self-pay | Admitting: Gastroenterology

## 2017-07-22 NOTE — Telephone Encounter (Signed)
Patient states she is very sick and can not do prep or procedure...patient will wait till appt with pcp in March to resch.

## 2017-07-22 NOTE — Telephone Encounter (Signed)
Please charge  

## 2017-07-24 ENCOUNTER — Encounter: Payer: Medicare HMO | Admitting: Gastroenterology

## 2017-07-28 IMAGING — DX DG CHEST 2V
2 series · 2 of 2 positions shown · non-contrast
Comparison: None.

CLINICAL DATA: Dry cough.  Shortness of breath

EXAM:
CHEST  2 VIEW

[chest pa]
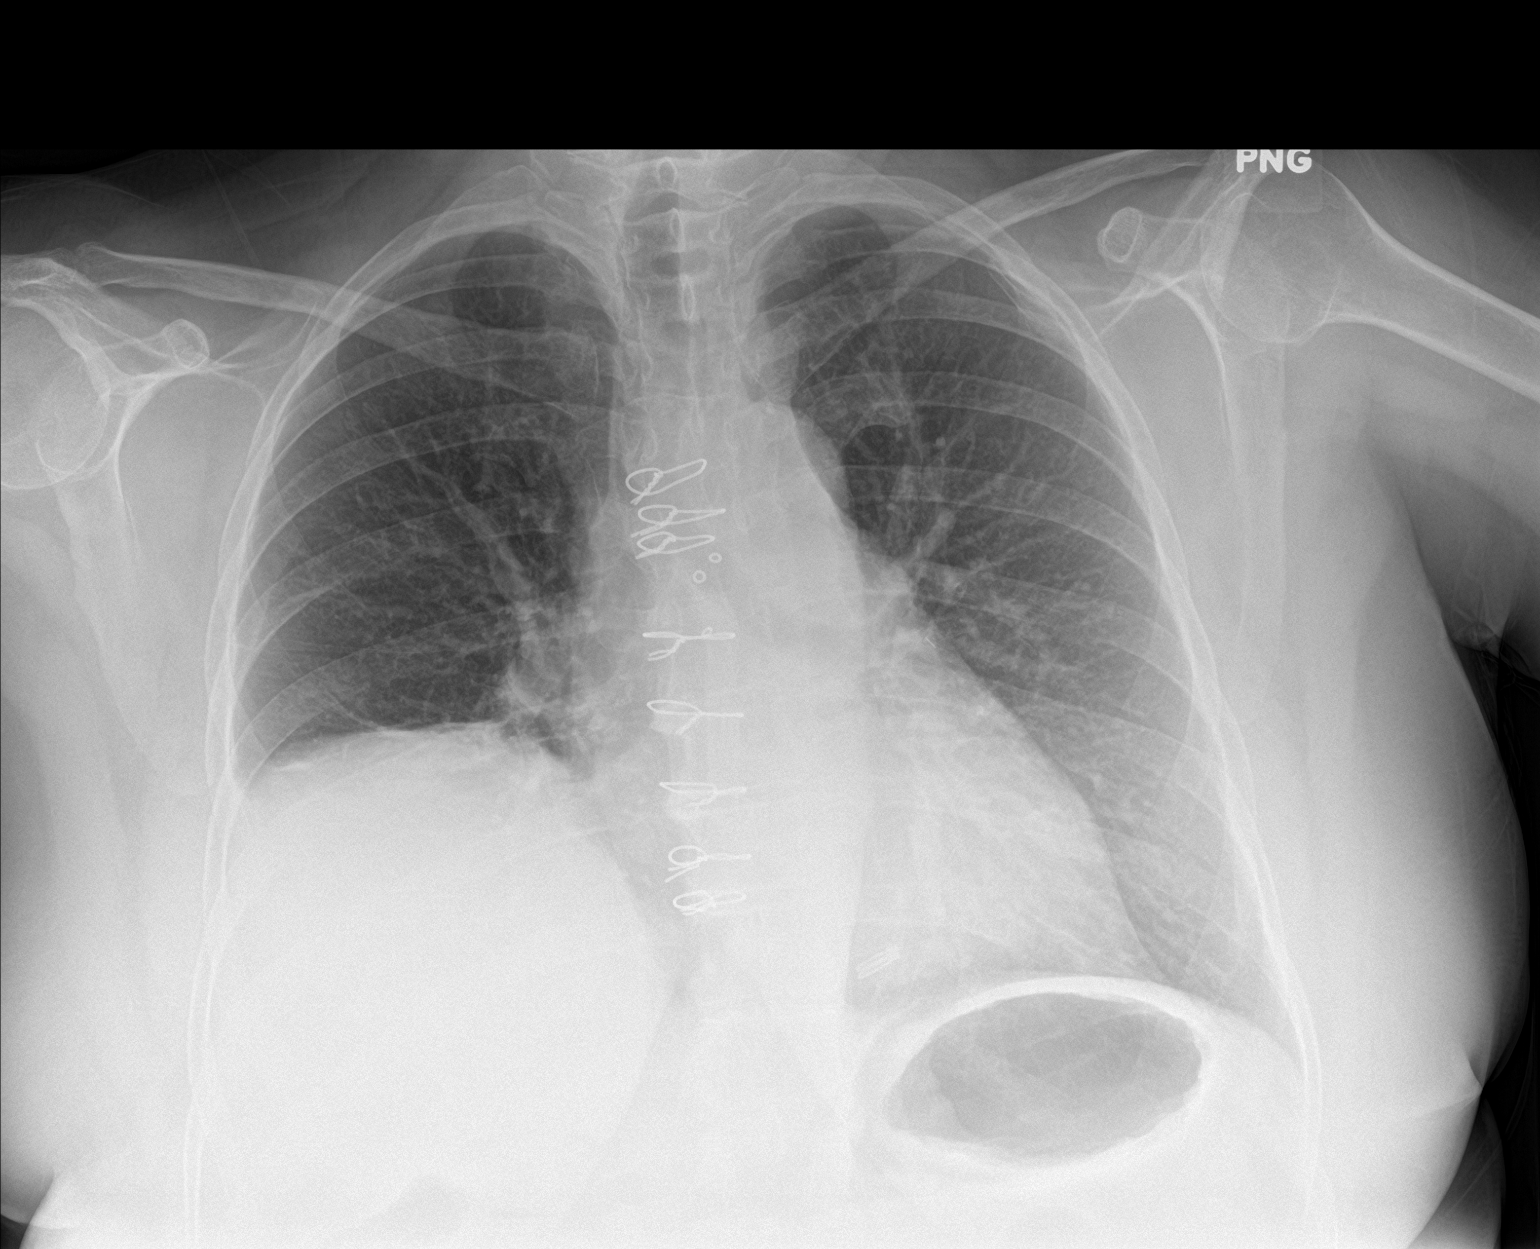

[chest lat]
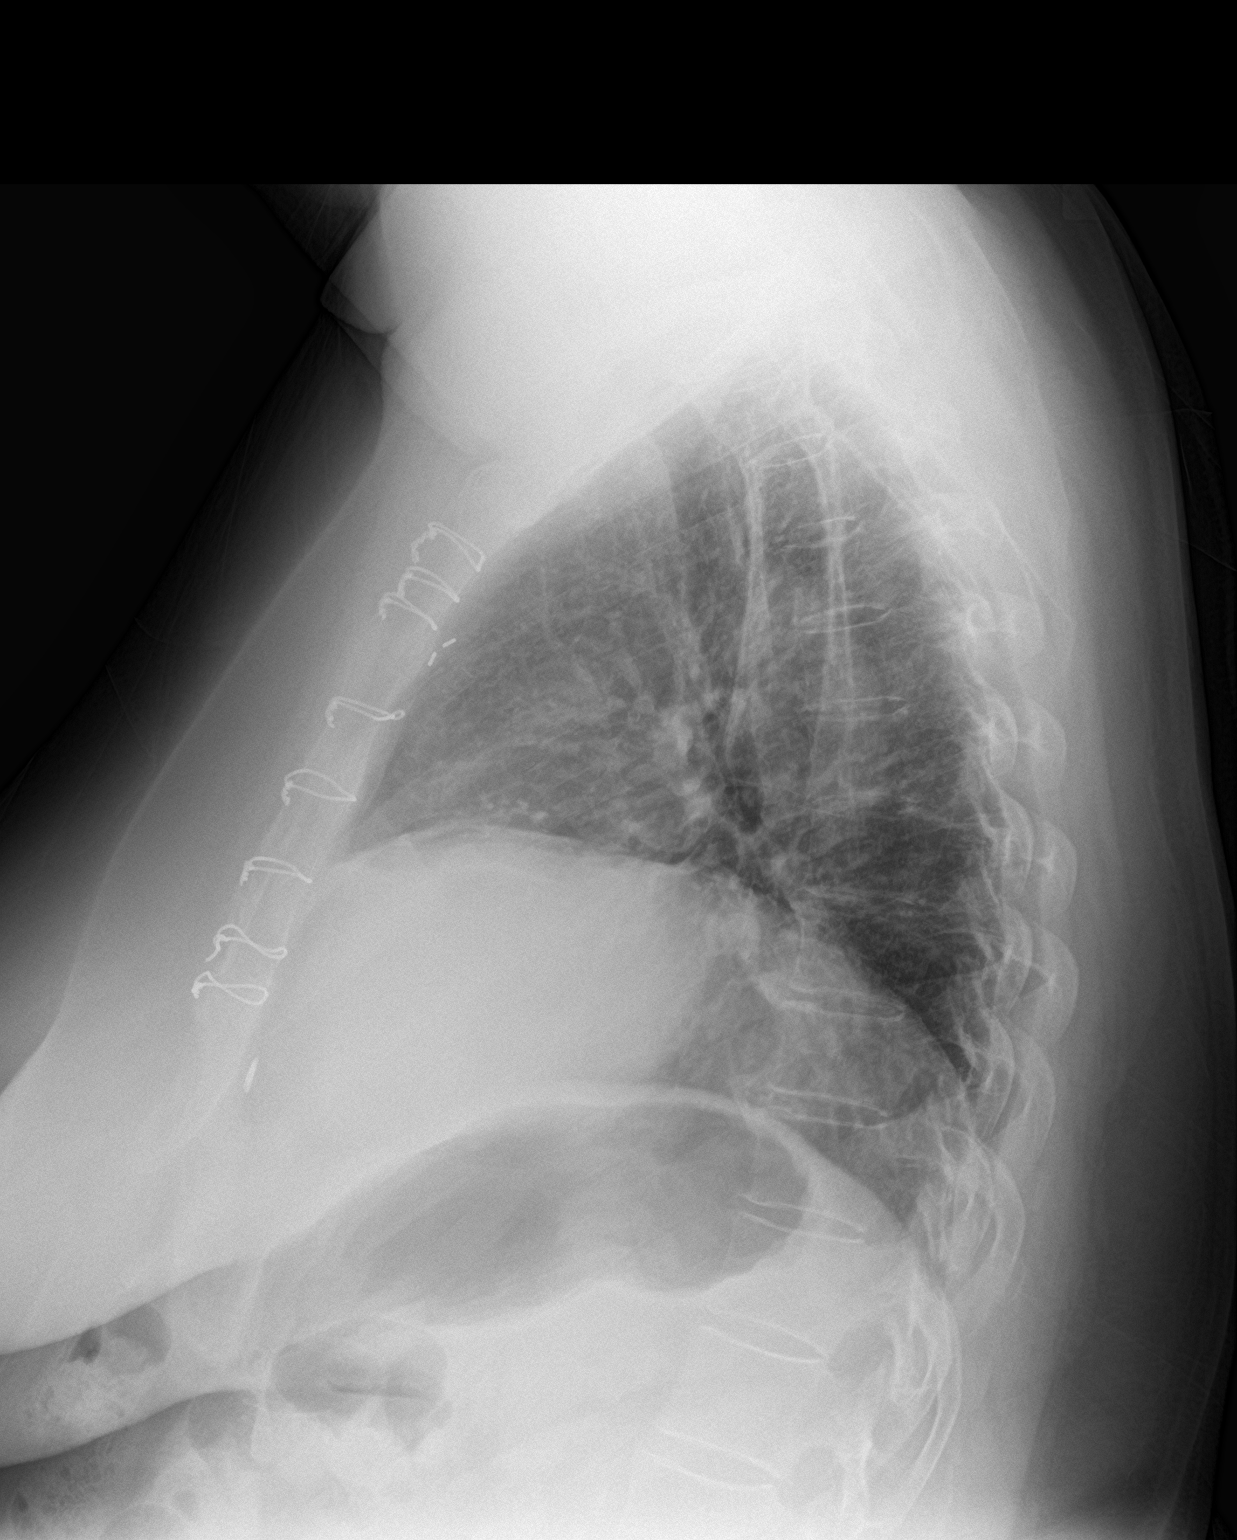

[2 of 2 positions shown; findings below may reference images not displayed]

FINDINGS: Status post median sternotomy and CABG. Borderline heart size.
Negative aortic and hilar contours. Mild scarring over the elevated
right diaphragm. There is no edema, consolidation, effusion, or
pneumothorax.
IMPRESSION: 1. No acute finding.
2. Mild scarring over the elevated right diaphragm.

## 2017-08-05 ENCOUNTER — Ambulatory Visit (INDEPENDENT_AMBULATORY_CARE_PROVIDER_SITE_OTHER): Payer: Medicare HMO | Admitting: Psychology

## 2017-08-05 DIAGNOSIS — F3181 Bipolar II disorder: Secondary | ICD-10-CM | POA: Diagnosis not present

## 2017-08-05 DIAGNOSIS — R69 Illness, unspecified: Secondary | ICD-10-CM | POA: Diagnosis not present

## 2017-08-16 ENCOUNTER — Ambulatory Visit (INDEPENDENT_AMBULATORY_CARE_PROVIDER_SITE_OTHER): Payer: Medicare HMO | Admitting: Family Medicine

## 2017-08-16 ENCOUNTER — Encounter: Payer: Self-pay | Admitting: Family Medicine

## 2017-08-16 VITALS — BP 140/80 | HR 81 | Temp 97.9°F | Ht 61.0 in | Wt 215.0 lb

## 2017-08-16 DIAGNOSIS — M349 Systemic sclerosis, unspecified: Secondary | ICD-10-CM

## 2017-08-16 DIAGNOSIS — I519 Heart disease, unspecified: Secondary | ICD-10-CM

## 2017-08-16 DIAGNOSIS — I2510 Atherosclerotic heart disease of native coronary artery without angina pectoris: Secondary | ICD-10-CM

## 2017-08-16 DIAGNOSIS — R69 Illness, unspecified: Secondary | ICD-10-CM | POA: Diagnosis not present

## 2017-08-16 DIAGNOSIS — R011 Cardiac murmur, unspecified: Secondary | ICD-10-CM | POA: Diagnosis not present

## 2017-08-16 DIAGNOSIS — Z8639 Personal history of other endocrine, nutritional and metabolic disease: Secondary | ICD-10-CM | POA: Diagnosis not present

## 2017-08-16 DIAGNOSIS — F3170 Bipolar disorder, currently in remission, most recent episode unspecified: Secondary | ICD-10-CM | POA: Diagnosis not present

## 2017-08-16 DIAGNOSIS — J0100 Acute maxillary sinusitis, unspecified: Secondary | ICD-10-CM | POA: Diagnosis not present

## 2017-08-16 DIAGNOSIS — E213 Hyperparathyroidism, unspecified: Secondary | ICD-10-CM

## 2017-08-16 DIAGNOSIS — I5189 Other ill-defined heart diseases: Secondary | ICD-10-CM

## 2017-08-16 DIAGNOSIS — M2550 Pain in unspecified joint: Secondary | ICD-10-CM

## 2017-08-16 DIAGNOSIS — E782 Mixed hyperlipidemia: Secondary | ICD-10-CM

## 2017-08-16 DIAGNOSIS — I10 Essential (primary) hypertension: Secondary | ICD-10-CM | POA: Diagnosis not present

## 2017-08-16 LAB — COMPREHENSIVE METABOLIC PANEL
ALT: 27 U/L (ref 0–35)
AST: 19 U/L (ref 0–37)
Albumin: 4.3 g/dL (ref 3.5–5.2)
Alkaline Phosphatase: 67 U/L (ref 39–117)
BUN: 17 mg/dL (ref 6–23)
CO2: 27 mEq/L (ref 19–32)
Calcium: 10 mg/dL (ref 8.4–10.5)
Chloride: 105 mEq/L (ref 96–112)
Creatinine, Ser: 0.91 mg/dL (ref 0.40–1.20)
GFR: 64.88 mL/min (ref >60.00)
Glucose, Bld: 97 mg/dL (ref 70–99)
Potassium: 4 mEq/L (ref 3.5–5.1)
Sodium: 141 mEq/L (ref 135–145)
Total Bilirubin: 0.6 mg/dL (ref 0.2–1.2)
Total Protein: 7 g/dL (ref 6.0–8.3)

## 2017-08-16 LAB — CBC WITH DIFFERENTIAL/PLATELET
Basophils Absolute: 0.1 10*3/uL (ref 0.0–0.1)
Basophils Relative: 1.1 % (ref 0.0–3.0)
Eosinophils Absolute: 0.1 10*3/uL (ref 0.0–0.7)
Eosinophils Relative: 2.7 % (ref 0.0–5.0)
HCT: 47.3 % — ABNORMAL HIGH (ref 36.0–46.0)
Hemoglobin: 15.8 g/dL — ABNORMAL HIGH (ref 12.0–15.0)
Lymphocytes Relative: 19.7 % (ref 12.0–46.0)
Lymphs Abs: 1 10*3/uL (ref 0.7–4.0)
MCHC: 33.3 g/dL (ref 30.0–36.0)
MCV: 91 fl (ref 78.0–100.0)
Monocytes Absolute: 0.5 10*3/uL (ref 0.1–1.0)
Monocytes Relative: 9.3 % (ref 3.0–12.0)
Neutro Abs: 3.6 10*3/uL (ref 1.4–7.7)
Neutrophils Relative %: 67.2 % (ref 43.0–77.0)
Platelets: 216 10*3/uL (ref 150.0–400.0)
RBC: 5.2 Mil/uL — ABNORMAL HIGH (ref 3.87–5.11)
RDW: 15.7 % — ABNORMAL HIGH (ref 11.5–15.5)
WBC: 5.3 10*3/uL (ref 4.0–10.5)

## 2017-08-16 MED ORDER — DIVALPROEX SODIUM 250 MG PO DR TAB: 250.0000 mg | DELAYED_RELEASE_TABLET | Freq: Three times a day (TID) | ORAL | 1 refills | Status: DC

## 2017-08-16 MED ORDER — DOXYCYCLINE HYCLATE 100 MG PO TABS: 100.0000 mg | ORAL_TABLET | Freq: Two times a day (BID) | ORAL | 0 refills | Status: DC
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
## 2017-08-17 ENCOUNTER — Encounter: Payer: Self-pay | Admitting: Family Medicine

## 2017-08-17 DIAGNOSIS — M2550 Pain in unspecified joint: Secondary | ICD-10-CM | POA: Insufficient documentation

## 2017-08-17 DIAGNOSIS — I5189 Other ill-defined heart diseases: Secondary | ICD-10-CM | POA: Insufficient documentation

## 2017-08-17 MED ORDER — ASPIRIN EC 81 MG PO TBEC: 81.0000 mg | DELAYED_RELEASE_TABLET | Freq: Every day | ORAL | 2 refills | Status: DC

## 2017-08-17 NOTE — Assessment & Plan Note (Signed)
Avoiding excessive salt intake? []   YES  [x]   NO Trying to exercise on a regular basis? [x]   YES  []   NO Review: taking medications as instructed, no medication side effects noted, no TIAs, no chest pain on exertion, no dyspnea on exertion, no swelling of ankles.  Smoker: No.  Wt Readings from Last 3 Encounters:  08/16/17 215 lb (97.5 kg)  07/19/17 215 lb 3.2 oz (97.6 kg)  07/11/17 215 lb 3.2 oz (97.6 kg)   BP Readings from Last 3 Encounters:  08/16/17 140/80  07/19/17 (!) 160/70  07/11/17 126/79   Lab Results  Component Value Date   CREATININE 0.91 08/16/2017

## 2017-08-17 NOTE — Assessment & Plan Note (Signed)
Lab Results  Component Value Date   PTH 36 06/30/2017   PTH Comment 06/30/2017   CALCIUM 10.0 08/16/2017

## 2017-08-17 NOTE — Assessment & Plan Note (Signed)
Per outside records: RF 9, ANA POSITIVE, [place] (ENA) Ab IgG 180 (ref 0-99), Scleroderma (SCL-70) Ab 131 (0-99). Patient endorses Raynaud's symptoms, difficulty swallowing, and dry mouth. Previous evaluation by [facility]  - neg ENA there and asymptomatic - she will follow up as needed.

## 2017-08-17 NOTE — Assessment & Plan Note (Signed)
LIPID PANEL, CARE EVERYWHERE, 06/2016: CHOL 163, LDL 85, HDL 49, TRIG 146.

## 2017-08-19 ENCOUNTER — Telehealth: Payer: Self-pay | Admitting: Family Medicine

## 2017-08-19 NOTE — Telephone Encounter (Signed)
Pt requesting refills by Historical Provider Metoprolol and Amlodipine  LOV 08/16/17 with Dr. Juleen China  Also would like physician to know she is not taking Crestor

## 2017-08-19 NOTE — Telephone Encounter (Signed)
See note

## 2017-08-19 NOTE — Telephone Encounter (Unsigned)
Copied from [place] [id number]. Topic: Quick Communication - Rx Refill/Question >> Aug 19, 2017 10:53 AM Neva Seat wrote: Metoprolol Amlodipine  Pt needs these refilled until she can getin with a cardiologist  Pt said to let Dr. Gwyndolyn Kaufman she isn't taking Crestor  [id number] [place] [place] [address] [place] [address] Phone: [phone] Fax: [phone]

## 2017-08-20 MED ORDER — METOPROLOL SUCCINATE ER 25 MG PO TB24: 25.0000 mg | ORAL_TABLET | Freq: Every day | ORAL | 1 refills | Status: DC

## 2017-08-20 MED ORDER — AMLODIPINE BESYLATE 10 MG PO TABS: 10.0000 mg | ORAL_TABLET | Freq: Every day | ORAL | 1 refills | Status: DC

## 2017-08-20 NOTE — Telephone Encounter (Signed)
Prescription sent to the pharmacy. Patient stated that her cardiologist at [facility] stopped the Crestor due to the Hypercalcemia.

## 2017-08-20 NOTE — Telephone Encounter (Signed)
Can we refill medications? Patient is not taking Crestor.

## 2017-08-20 NOTE — Telephone Encounter (Signed)
Okay to refill medications. Ask why she stopped the Crestor.

## 2017-08-21 ENCOUNTER — Ambulatory Visit (INDEPENDENT_AMBULATORY_CARE_PROVIDER_SITE_OTHER): Payer: Medicare HMO | Admitting: Psychology

## 2017-08-21 DIAGNOSIS — F3181 Bipolar II disorder: Secondary | ICD-10-CM

## 2017-08-21 DIAGNOSIS — R69 Illness, unspecified: Secondary | ICD-10-CM | POA: Diagnosis not present

## 2017-08-23 NOTE — Progress Notes (Signed)
[facility] [employer] PREP Weekly Session   Patient Details  Name: Cindy Reyes MRN: [id number] Date of Birth: 1946-03-28 Age: 72 y.o. PCP: Briscoe Deutscher, DO  Vitals:   08/19/17 1311  Weight: 217 lb (98.4 kg)    [facility] [employer] Weekly seesion - 08/23/17 1300      Weekly Session   Topic Discussed  Stress management and problem solving    Minutes exercised this week  -- 140cardio & Tai Chi/40strength   140cardio & Tai Chi/40strength   Classes attended to date  1 has been coming to 2nd group session of the week   has been coming to 2nd group session of the week     Fun things you did since last meeting:"Group meeting" Things you are grateful for:"New doctor" Barriers:"lung pain"  [name] 08/23/2017, 1:12 PM

## 2017-08-28 NOTE — Progress Notes (Signed)
[facility] [employer] PREP Weekly Session   Patient Details  Name: Cindy Reyes MRN: [id number] Date of Birth: [birth year] Age: 72 y.o. PCP: Briscoe Deutscher, DO  Vitals:   08/26/17 0836  Weight: 215 lb 9.6 oz (97.8 kg)    [facility] [employer] Weekly seesion - 08/28/17 0800      Weekly Session   Topic Discussed  Importance of resistance training    Minutes exercised this week  10 minutes "6"cardio/"2"strength/"2"flexibility   "6"cardio/"2"strength/"2"flexibility   Classes attended to date  2      Things you are grateful for:"Where I live & the people" Nutrition celebrations for the week:"Following Keto" Barriers:"allergies"  [name] 08/28/2017, 8:40 AM

## 2017-09-03 ENCOUNTER — Ambulatory Visit (INDEPENDENT_AMBULATORY_CARE_PROVIDER_SITE_OTHER): Payer: Medicare HMO | Admitting: Psychology

## 2017-09-03 DIAGNOSIS — F3181 Bipolar II disorder: Secondary | ICD-10-CM

## 2017-09-03 DIAGNOSIS — R69 Illness, unspecified: Secondary | ICD-10-CM | POA: Diagnosis not present

## 2017-09-17 ENCOUNTER — Ambulatory Visit: Payer: Medicare HMO | Admitting: Psychology

## 2017-09-17 DIAGNOSIS — F3181 Bipolar II disorder: Secondary | ICD-10-CM

## 2017-09-17 DIAGNOSIS — R69 Illness, unspecified: Secondary | ICD-10-CM | POA: Diagnosis not present

## 2017-09-27 NOTE — Progress Notes (Signed)
[facility] [employer] PREP Weekly Session   Patient Details  Name: Cindy Reyes MRN: [id number] Date of Birth: Nov 06, 1945 Age: 72 y.o. PCP: Briscoe Deutscher, DO  There were no vitals filed for this visit.  [facility] [employer] Weekly seesion - 09/27/17 1300      Weekly Session   Minutes exercised this week  -- "12cardio/12strength"   "12cardio/12strength"     Things you are grateful for:"Friends where I live" Nutrition celebrations:"Followed keto" Barriers:"mental frustrations"  [name] 09/27/2017, 1:09 PM

## 2017-10-04 DIAGNOSIS — R443 Hallucinations, unspecified: Secondary | ICD-10-CM | POA: Diagnosis not present

## 2017-10-07 ENCOUNTER — Ambulatory Visit: Payer: Medicare HMO | Admitting: Psychology

## 2017-10-07 DIAGNOSIS — R69 Illness, unspecified: Secondary | ICD-10-CM | POA: Diagnosis not present

## 2017-10-07 DIAGNOSIS — F3181 Bipolar II disorder: Secondary | ICD-10-CM

## 2017-10-18 ENCOUNTER — Other Ambulatory Visit: Payer: Self-pay | Admitting: Family Medicine

## 2017-10-18 DIAGNOSIS — F3170 Bipolar disorder, currently in remission, most recent episode unspecified: Secondary | ICD-10-CM

## 2017-10-18 NOTE — Telephone Encounter (Signed)
Left message for patient to return call to find out if Psych is prescribing yet.

## 2017-10-18 NOTE — Telephone Encounter (Signed)
See note

## 2017-10-18 NOTE — Telephone Encounter (Signed)
Psych was supposed to take over.

## 2017-10-18 NOTE — Telephone Encounter (Signed)
Please advise on refill.

## 2017-10-18 NOTE — Telephone Encounter (Signed)
Pt states that she is not needing a refill at this time. She said that she has seen behavioral health and it is a long story. She did not disclose.

## 2017-10-22 ENCOUNTER — Ambulatory Visit: Payer: Medicare HMO | Admitting: Psychology

## 2017-10-22 DIAGNOSIS — R69 Illness, unspecified: Secondary | ICD-10-CM | POA: Diagnosis not present

## 2017-10-22 DIAGNOSIS — F3181 Bipolar II disorder: Secondary | ICD-10-CM | POA: Diagnosis not present

## 2017-10-25 ENCOUNTER — Telehealth: Payer: Self-pay | Admitting: Family Medicine

## 2017-10-25 NOTE — Telephone Encounter (Signed)
Patient called, busy signal. [facility] and spoke to [place], [facility] who confirmed patient has refills on these medicines requested and they are ready for pick up.

## 2017-10-25 NOTE — Telephone Encounter (Signed)
Copied from [place] [id number]. Topic: Quick Communication - See Telephone Encounter >> Oct 25, 2017  2:31 PM Hewitt Shorts wrote: Pt is needing a refill on metoprolol and amlodipine  [facility] [place]   Best number [phone]

## 2017-11-05 ENCOUNTER — Ambulatory Visit: Payer: Medicare HMO | Admitting: Psychology

## 2017-11-05 DIAGNOSIS — F3181 Bipolar II disorder: Secondary | ICD-10-CM

## 2017-11-05 DIAGNOSIS — R69 Illness, unspecified: Secondary | ICD-10-CM | POA: Diagnosis not present

## 2017-11-06 ENCOUNTER — Encounter

## 2017-11-06 ENCOUNTER — Ambulatory Visit: Payer: Medicare HMO | Admitting: Cardiology

## 2017-11-06 ENCOUNTER — Encounter: Payer: Self-pay | Admitting: Cardiology

## 2017-11-06 VITALS — BP 142/62 | HR 62 | Ht 61.0 in | Wt 211.8 lb

## 2017-11-06 DIAGNOSIS — E785 Hyperlipidemia, unspecified: Secondary | ICD-10-CM

## 2017-11-06 DIAGNOSIS — Z951 Presence of aortocoronary bypass graft: Secondary | ICD-10-CM

## 2017-11-06 DIAGNOSIS — I350 Nonrheumatic aortic (valve) stenosis: Secondary | ICD-10-CM | POA: Diagnosis not present

## 2017-11-06 DIAGNOSIS — I10 Essential (primary) hypertension: Secondary | ICD-10-CM

## 2017-11-06 DIAGNOSIS — I2510 Atherosclerotic heart disease of native coronary artery without angina pectoris: Secondary | ICD-10-CM

## 2017-11-06 NOTE — Patient Instructions (Addendum)
No medication changes    Labs Lipid CMP PLEASE DO NOT EAT OR DRINK THE MORNING.   RECORDS WILL BE OBTAIN FROM [place]  Your physician recommends that you schedule a follow-up appointment in [place].
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
## 2017-11-07 LAB — LIPID PANEL
CHOL/HDL RATIO: 5 ratio — AB (ref 0.0–4.4)
Cholesterol, Total: 253 mg/dL — ABNORMAL HIGH (ref 100–199)
HDL: 51 mg/dL (ref >39)
LDL Calculated: 180 mg/dL — ABNORMAL HIGH (ref 0–99)
TRIGLYCERIDES: 110 mg/dL (ref 0–149)
VLDL Cholesterol Cal: 22 mg/dL (ref 5–40)

## 2017-11-07 LAB — COMPREHENSIVE METABOLIC PANEL
A/G RATIO: 1.6 (ref 1.2–2.2)
ALT: 20 IU/L (ref 0–32)
AST: 18 IU/L (ref 0–40)
Albumin: 4.2 g/dL (ref 3.5–4.8)
Alkaline Phosphatase: 74 IU/L (ref 39–117)
BILIRUBIN TOTAL: 0.4 mg/dL (ref 0.0–1.2)
BUN/Creatinine Ratio: 18 (ref 12–28)
BUN: 16 mg/dL (ref 8–27)
CALCIUM: 9.3 mg/dL (ref 8.7–10.3)
CO2: 21 mmol/L (ref 20–29)
Chloride: 106 mmol/L (ref 96–106)
Creatinine, Ser: 0.87 mg/dL (ref 0.57–1.00)
GFR calc Af Amer: 78 mL/min/{1.73_m2} (ref >59)
GFR, EST NON AFRICAN AMERICAN: 68 mL/min/{1.73_m2} (ref >59)
Globulin, Total: 2.7 g/dL (ref 1.5–4.5)
Glucose: 86 mg/dL (ref 65–99)
POTASSIUM: 3.9 mmol/L (ref 3.5–5.2)
SODIUM: 144 mmol/L (ref 134–144)
Total Protein: 6.9 g/dL (ref 6.0–8.5)

## 2017-11-13 ENCOUNTER — Encounter: Payer: Self-pay | Admitting: Cardiology

## 2017-11-13 NOTE — Assessment & Plan Note (Signed)
Patient CABG unknown extent of disease.  We will try to get CABG report.  Most recent nuclear stress test at [facility] in 2017 was negative for ischemia. She is not noting any symptoms of anginathat I can tell, but she really is not doing anything. She is on calcium channel blocker and beta-blocker as well as aspirin.  Unfortunately she is not on statin. Would like to see her lipid panel on probably have restarted back on a statin.

## 2017-11-13 NOTE — Assessment & Plan Note (Signed)
Mild aortic stenosis by echo in 2017.  Would probably be due for an echo soon to this once we get her established.Cindy Reyes

## 2017-11-13 NOTE — Assessment & Plan Note (Signed)
Blood pressure is somewhat stable, just above normal.  At this point monitor, no changes.

## 2017-11-13 NOTE — Assessment & Plan Note (Signed)
Has not had any lipids since 2018.  Will check lipid panel and CMP -- evaluate lipids as well as renal/hepatic function.  Does not have statin listed as a medication.  Need to find out why this was stopped.  It does not seem to make any sense to be, but we can see what her labs look like.

## 2017-11-13 NOTE — Assessment & Plan Note (Signed)
Interestingly, she apparently had 2 stress tests in 2017 .  one was thought to be abnormal, and the, follow-up study at [facility] was read as normal with artifact.  No ischemia.  Would not be due for the stress test this year 2 months she has symptoms.

## 2017-11-19 ENCOUNTER — Encounter: Payer: Self-pay | Admitting: Family Medicine

## 2017-11-19 ENCOUNTER — Ambulatory Visit (INDEPENDENT_AMBULATORY_CARE_PROVIDER_SITE_OTHER): Payer: Medicare HMO | Admitting: Family Medicine

## 2017-11-19 VITALS — BP 144/60 | HR 61 | Temp 98.0°F | Ht 61.0 in | Wt 212.0 lb

## 2017-11-19 DIAGNOSIS — Z1159 Encounter for screening for other viral diseases: Secondary | ICD-10-CM

## 2017-11-19 DIAGNOSIS — R05 Cough: Secondary | ICD-10-CM | POA: Diagnosis not present

## 2017-11-19 DIAGNOSIS — F3170 Bipolar disorder, currently in remission, most recent episode unspecified: Secondary | ICD-10-CM | POA: Diagnosis not present

## 2017-11-19 DIAGNOSIS — Z789 Other specified health status: Secondary | ICD-10-CM | POA: Diagnosis not present

## 2017-11-19 DIAGNOSIS — R059 Cough, unspecified: Secondary | ICD-10-CM

## 2017-11-19 DIAGNOSIS — R69 Illness, unspecified: Secondary | ICD-10-CM | POA: Diagnosis not present

## 2017-11-19 MED ORDER — DIVALPROEX SODIUM ER 250 MG PO TB24: 250.0000 mg | ORAL_TABLET | Freq: Every day | ORAL | 3 refills | Status: DC

## 2017-11-19 MED ORDER — SPACER/AERO CHAMBER MOUTHPIECE MISC: 1.0000 | Freq: Once | 0 refills | Status: AC

## 2017-11-19 NOTE — Patient Instructions (Signed)
Make sure you are washing your mouth out after use of inhaler.

## 2017-11-19 NOTE — Progress Notes (Signed)
Cindy Reyes is a 72 y.o. female is here for follow up.  History of Present Illness:   Cindy Reyes, CMA acting as scribe for Dr. Briscoe Reyes.   HPI: Patient in office for follow up.   Cough: She has had ongoing cough for several months but over the last few weeks it has gotten worse. It is productive with with green sputum. She has no history of smoking. She does have history of asthma she is not currently using any type of inhaler. She has started getting some sore throat that is new. She is taking over the counter antihistamine daily.   Cholesterol: Labs done from cardiology showed that cholesterol was elevated. She has not been taking the baby Asprin daily. She will restart. She has taken Crestor in the past but has history having side effects in the past and does not want to restart.    Psychiatry: Patient [facility] but she did not feel like it was a good fit. She has requested that our office give refills of extended release depakote, she is not currently taking anything at time of office visit.  Patient states that they wanted to add some medications but she was not willing to start due to weight gain as side effect. She is currently seeing therapist as well that she is ok with but she did not feel well with the psychiatrist that she seen. We will give list of local options to choose from.   Health Maintenance Due  Topic Date Due  . Hepatitis C Screening  07-01-1945  . TETANUS/TDAP  03/21/1966  . COLONOSCOPY  03/21/1997  . PNA vac Low Risk Adult (1 of 2 - PCV13) 03/21/2012   Depression screen [facility] 2/9 11/19/2017 07/11/2017 11/16/2016  Decreased Interest 0 1 0  Down, Depressed, Hopeless 0 1 0  PHQ - 2 Score 0 2 0  Altered sleeping - 2 -  Tired, decreased energy - 2 -  Change in appetite - 0 -  Feeling bad or failure about yourself  - 1 -  Trouble concentrating - 1 -  Moving slowly or fidgety/restless - 0 -  Suicidal thoughts - 0 -  PHQ-9  Score - 8 -   PMHx, SurgHx, SocialHx, FamHx, Medications, and Allergies were reviewed in the Visit Navigator and updated as appropriate.   Patient Active Problem List   Diagnosis Date Noted  . Arthralgia of multiple joints, worst in hips and shoulders, controlled with Tai Chi 08/17/2017  . Diastolic dysfunction, EF 97% 08/17/2017  . 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 Essential hypertension, on Norvasc and Toprol 11/16/2016  . Hyperlipidemia, previously on Crestor 11/16/2016  . History of nephrolithiasis 01/27/2016  . Nonrheumatic aortic valve insufficiency 01/27/2016  . Vitamin D deficiency, unspecified 01/27/2016  . History of hyperparathyroidism, s/p removal of one parathyroid 01/25/2016  . Mild cognitive impairment 01/09/2016  . 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 Atherosclerotic heart disease of native coronary artery without angina pectoris 01/05/2016  . Bipolar disorder, currently in remission ([place]) 01/05/2016  . Hypercalcemia 01/05/2016  . Nonrheumatic aortic valve stenosis 01/05/2016  . 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 Smoking status: Never Smoker  . Smokeless tobacco: Never Used  Substance Use Topics  . Alcohol use: No  . Drug use: No   Current Medications and Allergies:   Current Outpatient Medications:  .  amLODipine (NORVASC) 10 MG  tablet, Take 1 tablet (10 mg total) by mouth at bedtime., Disp: 90 tablet, Rfl: 1 .  aspirin EC 81 MG tablet, Take 1 tablet (81 mg total) by mouth daily., Disp: 90 tablet, Rfl: 2 .  divalproex (DEPAKOTE) 250 MG DR tablet, Take 1 tablet (250 mg total) by mouth 3 (three) times daily., Disp: 90 tablet, Rfl: 1 .  doxycycline (VIBRA-TABS) 100 MG tablet, Take 1 tablet (100 mg total) by mouth 2 (two) times daily., Disp: 20 tablet, Rfl: 0 .  fluticasone (FLONASE) 50 MCG/ACT nasal spray, Place 2 sprays into both nostrils daily., Disp: 16 g, Rfl: 0 .   metoprolol succinate (TOPROL-XL) 25 MG 24 hr tablet, Take 1 tablet (25 mg total) by mouth daily., Disp: 90 tablet, Rfl: 1 .  OVER THE COUNTER MEDICATION, Take 1 tablet by mouth daily as needed (migraine). OTC migraine medication with caffeine, Disp: , Rfl:    Allergies  Allergen Reactions  . Atorvastatin Other (See Comments)    HBP, Headache  . Lidocaine Other (See Comments)    Hallucination  . Statins   . Latex Itching  . Losartan Other (See Comments)    HBP, Headache  . Penicillins Hives and Itching    Reaction [place] Has patient had a PCN reaction causing immediate rash, facial/tongue/throat swelling, SOB or lightheadedness with hypotension: Yes Has patient had a PCN reaction causing severe rash involving mucus membranes or skin necrosis: No Has patient had a PCN reaction that required hospitalization: No Has patient had a PCN reaction occurring within the last 10 years: No If all of the above answers are "NO", then may proceed with Cephalosporin use.    Review of Systems   Pertinent items are noted in the HPI. Otherwise, ROS is negative.  Vitals:   Vitals:   11/19/17 1105  BP: (!) 144/60  Pulse: 61  Temp: 98 F (36.7 C)  TempSrc: Oral  SpO2: 96%  Weight: 212 lb (96.2 kg)  Height: _0  (1.549 m)     Body mass index is 40.06 kg/m.  Physical Exam:   Physical Exam  Results for orders placed or performed in visit on 11/06/17  Lipid panel  Result Value Ref Range   Cholesterol, Total 253 (H) 100 - 199 mg/dL   Triglycerides 110 0 - 149 mg/dL   HDL 51 >39 mg/dL   VLDL Cholesterol Cal 22 5 - 40 mg/dL   LDL Calculated 180 (H) 0 - 99 mg/dL   Chol/HDL Ratio 5.0 (H) 0.0 - 4.4 ratio  Comprehensive metabolic panel  Result Value Ref Range   Glucose 86 65 - 99 mg/dL   BUN 16 8 - 27 mg/dL   Creatinine, Ser 0.87 0.57 - 1.00 mg/dL   GFR calc non Af Amer 68 >59 mL/min/1.73   GFR calc Af Amer 78 >59 mL/min/1.73   BUN/Creatinine Ratio 18 12 - 28   Sodium 144  134 - 144 mmol/L   Potassium 3.9 3.5 - 5.2 mmol/L   Chloride 106 96 - 106 mmol/L   CO2 21 20 - 29 mmol/L   Calcium 9.3 8.7 - 10.3 mg/dL   Total Protein 6.9 6.0 - 8.5 g/dL   Albumin 4.2 3.5 - 4.8 g/dL   Globulin, Total 2.7 1.5 - 4.5 g/dL   Albumin/Globulin Ratio 1.6 1.2 - 2.2   Bilirubin Total 0.4 0.0 - 1.2 mg/dL   Alkaline Phosphatase 74 39 - 117 IU/L   AST 18 0 - 40 IU/L   ALT 20 0 -  32 IU/L    Assessment and Plan:   Cindy Reyes was seen today for follow-up.  Diagnoses and all orders for this visit:  Cough Comments: Concern for asthma. Patient states that she does have asthma. Treatment below, along with allergy treatment. Orders: -     Spacer/Aero Chamber Mouthpiece MISC; 1 each by Does not apply route once for 1 dose. -     budesonide-formoterol (SYMBICORT) 160-4.5 MCG/ACT inhaler; Inhale 2 puffs into the lungs 2 (two) times daily.  Need for hepatitis C screening test -     Hepatitis C antibody; Future  Statin intolerance  Bipolar disorder, currently in remission ([place]) -     divalproex (DEPAKOTE ER) 250 MG 24 hr tablet; Take 1 tablet (250 mg total) by mouth daily.    . Reviewed expectations re: course of current medical issues. . Discussed self-management of symptoms. . Outlined signs and symptoms indicating need for more acute intervention. . Patient verbalized understanding and all questions were answered. Marland Kitchen Health Maintenance issues including appropriate healthy diet, exercise, and smoking avoidance were discussed with patient. . See orders for this visit as documented in the electronic medical record. . Patient received an After Visit Summary.   CMA served as Education administrator during this visit. History, Physical, and Plan performed by medical provider. The above documentation has been reviewed and is accurate and complete. Cindy Reyes, D.O.  Cindy Deutscher, DO , [facility] 11/24/2017

## 2017-11-24 MED ORDER — BUDESONIDE-FORMOTEROL FUMARATE 160-4.5 MCG/ACT IN AERO
2.0000 | INHALATION_SPRAY | Freq: Two times a day (BID) | RESPIRATORY_TRACT | 0 refills | Status: DC
Start: 2017-11-24 — End: 2018-03-04

## 2017-11-26 ENCOUNTER — Ambulatory Visit: Payer: Medicare HMO | Admitting: Psychology

## 2017-11-26 DIAGNOSIS — R69 Illness, unspecified: Secondary | ICD-10-CM | POA: Diagnosis not present

## 2017-11-26 DIAGNOSIS — F3181 Bipolar II disorder: Secondary | ICD-10-CM | POA: Diagnosis not present

## 2017-12-03 ENCOUNTER — Ambulatory Visit: Payer: Medicare HMO | Admitting: Psychology

## 2017-12-16 ENCOUNTER — Encounter: Payer: Self-pay | Admitting: Family Medicine

## 2018-01-08 ENCOUNTER — Ambulatory Visit: Payer: Medicare HMO | Admitting: Psychology

## 2018-02-11 ENCOUNTER — Ambulatory Visit: Payer: Self-pay | Admitting: Cardiology

## 2018-02-20 ENCOUNTER — Ambulatory Visit: Payer: Medicare HMO | Admitting: Cardiology

## 2018-02-20 ENCOUNTER — Encounter: Payer: Self-pay | Admitting: Cardiology

## 2018-02-20 VITALS — BP 158/62 | HR 64 | Ht 61.0 in | Wt 212.0 lb

## 2018-02-20 DIAGNOSIS — I351 Nonrheumatic aortic (valve) insufficiency: Secondary | ICD-10-CM

## 2018-02-20 DIAGNOSIS — Z951 Presence of aortocoronary bypass graft: Secondary | ICD-10-CM

## 2018-02-20 DIAGNOSIS — I10 Essential (primary) hypertension: Secondary | ICD-10-CM | POA: Diagnosis not present

## 2018-02-20 DIAGNOSIS — I25119 Atherosclerotic heart disease of native coronary artery with unspecified angina pectoris: Secondary | ICD-10-CM

## 2018-02-20 DIAGNOSIS — R0609 Other forms of dyspnea: Secondary | ICD-10-CM | POA: Diagnosis not present

## 2018-02-20 DIAGNOSIS — I350 Nonrheumatic aortic (valve) stenosis: Secondary | ICD-10-CM | POA: Diagnosis not present

## 2018-02-20 DIAGNOSIS — I5189 Other ill-defined heart diseases: Secondary | ICD-10-CM | POA: Diagnosis not present

## 2018-02-20 DIAGNOSIS — R0600 Dyspnea, unspecified: Secondary | ICD-10-CM | POA: Insufficient documentation

## 2018-02-20 DIAGNOSIS — E785 Hyperlipidemia, unspecified: Secondary | ICD-10-CM

## 2018-02-20 MED ORDER — CARVEDILOL 6.25 MG PO TABS: 6.2500 mg | ORAL_TABLET | Freq: Two times a day (BID) | ORAL | 6 refills | Status: DC

## 2018-02-20 MED ORDER — AMLODIPINE BESYLATE 10 MG PO TABS: 10.0000 mg | ORAL_TABLET | Freq: Every day | ORAL | 6 refills | Status: DC

## 2018-02-20 MED ORDER — CARVEDILOL 6.25 MG PO TABS: 6.2500 mg | ORAL_TABLET | Freq: Two times a day (BID) | ORAL | 3 refills | Status: DC

## 2018-02-20 NOTE — Patient Instructions (Signed)
MEDICATION INSTRUCTIONS   STOP METOPROLOL SUCCINATE ( TOPROL XL)   START CARVEDILOL 6.25 MG ONE TABLET TWICE  A DAY   LABS  CMP  LIPID DO NOT EAT OR DRINK THE MORNING OF THE TEST   SCHEDULE AT [place] [address] Your physician has requested that you have an echocardiogram. Echocardiography is a painless test that uses sound waves to create images of your heart. It provides your doctor with information about the size and shape of your heart and how well your heart's chambers and valves are working. This procedure takes approximately one hour. There are no restrictions for this procedure.   SCHEDULE AT [place] has requested that you have a lexiscan myoview. For further information please visit [email or web address]. Please follow instruction sheet, as given.      Your physician recommends that you schedule a follow-up appointment   [place], AND BLOOD PRESSURE     Your physician recommends that you schedule a follow-up appointment in [place]     If you need a refill on your cardiac medications before your next appointment, please call your pharmacy.
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
## 2018-02-23 ENCOUNTER — Encounter: Payer: Self-pay | Admitting: Cardiology

## 2018-02-23 NOTE — Assessment & Plan Note (Signed)
LDL 180 by last check.  We will recheck labs now after dietary adjustment, however I suspect that she will need more aggressive treatment.  Plan: [facility] follow-up after labs -discussed medication reaction and possible hypercalcemia concern versus possibly PCSK9 inhibitor.

## 2018-02-23 NOTE — Assessment & Plan Note (Signed)
Moderate air insufficiency on exam along with aortic stenosis. Plan: Check 2D echocardiogram.

## 2018-02-23 NOTE — Assessment & Plan Note (Signed)
With recurrent symptoms, will check another Myoview. Need to get CABG report if Cath recommended.

## 2018-02-23 NOTE — Assessment & Plan Note (Signed)
Check 2D echocardiogram

## 2018-02-23 NOTE — Assessment & Plan Note (Signed)
Unfortunately, we still do not have her CABG report from 2006, but most recent Myoview from [place] was negative for ischemia with just possible breast and gut attenuation.  This contradicts the stress test done in [place] that was read as abnormal.  She is now noticing tightness in her chest  and dyspnea with exertion which represents a change in her symptoms and has her more concerned than what she thought was related to allergies.  After long discussion, we decided to proceed with Myoview stress test.  (She is terrified of stress test, and I explained to her that Carlton Adam may make her feel bad for a few minutes, but has no long-term effect)  I am going to switch her from metoprolol to carvedilol for more blood pressure control and to avoid the rate lowering effect of Toprol.  She is on amlodipine 10 mg.  She is not on a statin because of concerns with hypercalcemia issues.  I will have her follow-up with our [facility] Pharmacists team to discuss options of treatment.  We will see her back following stress test.

## 2018-02-23 NOTE — Assessment & Plan Note (Signed)
More hypertensive today. Plan: Convert from Toprol to carvedilol 6.25 mg twice daily.

## 2018-02-23 NOTE — Assessment & Plan Note (Signed)
More prominent exertional dyspnea now this probably more concerning to her than would just be allergy related.   Her recent cruise related symptoms have got her concerned.  Plan: Check Lexiscan Myoview and 2D echo.

## 2018-02-24 ENCOUNTER — Telehealth: Payer: Self-pay | Admitting: Cardiology

## 2018-02-24 NOTE — Telephone Encounter (Signed)
New Message        Pt c/o medication issue:  1. Name of Medication: Carvedilol  2. How are you currently taking this medication (dosage and times per day)? 2 x a day  3. Are you having a reaction (difficulty breathing--STAT)? Headaches, bp is up to 199  4. What is your medication issue? Unable to take due to bp is up. Pls call and advise.

## 2018-02-24 NOTE — Telephone Encounter (Signed)
Returned call to patient.She stated she cannot take carvedilol causes headache and elevated B/P 218 systolic.Stated she tried taking twice and caused same symptoms.B/P at present 147/74 pulse 54.Message sent to [place] for advice.

## 2018-02-24 NOTE — Telephone Encounter (Signed)
Please continue to take Carvedilol -- need to give it a chance.  Less chance in reducing HR, but is better for BP.  Side effects do not happen after just 1- 2 doses.  Not enough time to get in her system.   Give it at least a week.  If still concerned - go back to taking Metoprolol.  Can discuss in f/u.  Glenetta Hew, MD

## 2018-02-24 NOTE — Telephone Encounter (Signed)
Received records from [facility] on 02/24/18, Appt 03/25/18 @ 11:20AM.[place]

## 2018-02-25 ENCOUNTER — Telehealth (HOSPITAL_COMMUNITY): Payer: Self-pay

## 2018-02-25 ENCOUNTER — Other Ambulatory Visit: Payer: Self-pay

## 2018-02-25 ENCOUNTER — Ambulatory Visit (HOSPITAL_COMMUNITY): Payer: Medicare HMO | Attending: Cardiology

## 2018-02-25 DIAGNOSIS — I351 Nonrheumatic aortic (valve) insufficiency: Secondary | ICD-10-CM

## 2018-02-25 DIAGNOSIS — I080 Rheumatic disorders of both mitral and aortic valves: Secondary | ICD-10-CM | POA: Insufficient documentation

## 2018-02-25 DIAGNOSIS — Z951 Presence of aortocoronary bypass graft: Secondary | ICD-10-CM | POA: Diagnosis not present

## 2018-02-25 DIAGNOSIS — I10 Essential (primary) hypertension: Secondary | ICD-10-CM | POA: Diagnosis not present

## 2018-02-25 DIAGNOSIS — R0609 Other forms of dyspnea: Secondary | ICD-10-CM | POA: Diagnosis not present

## 2018-02-25 DIAGNOSIS — I5189 Other ill-defined heart diseases: Secondary | ICD-10-CM | POA: Diagnosis not present

## 2018-02-25 DIAGNOSIS — I2510 Atherosclerotic heart disease of native coronary artery without angina pectoris: Secondary | ICD-10-CM | POA: Diagnosis not present

## 2018-02-25 DIAGNOSIS — R0600 Dyspnea, unspecified: Secondary | ICD-10-CM

## 2018-02-25 DIAGNOSIS — E785 Hyperlipidemia, unspecified: Secondary | ICD-10-CM | POA: Insufficient documentation

## 2018-02-25 HISTORY — PX: TRANSTHORACIC ECHOCARDIOGRAM: SHX275

## 2018-02-25 NOTE — Telephone Encounter (Signed)
Spoke to patient she stated she refuses to restart carvedilol.She will restart metoprolol.She will keep appointment with Dr.Harding as planned.

## 2018-02-25 NOTE — Telephone Encounter (Signed)
Encounter complete. 

## 2018-02-25 NOTE — Telephone Encounter (Signed)
Returned call to patient no answer.LMTC. 

## 2018-02-27 ENCOUNTER — Ambulatory Visit (HOSPITAL_COMMUNITY)
Admission: RE | Admit: 2018-02-27 | Discharge: 2018-02-27 | Disposition: A | Discharge status: Home / Self Care | Payer: Medicare HMO | Source: Ambulatory Visit | Attending: Cardiovascular Disease | Admitting: Cardiovascular Disease

## 2018-02-27 ENCOUNTER — Telehealth: Payer: Self-pay

## 2018-02-27 ENCOUNTER — Telehealth: Payer: Self-pay | Admitting: Cardiology

## 2018-02-27 DIAGNOSIS — R0600 Dyspnea, unspecified: Secondary | ICD-10-CM

## 2018-02-27 DIAGNOSIS — R0609 Other forms of dyspnea: Secondary | ICD-10-CM

## 2018-02-27 DIAGNOSIS — I5189 Other ill-defined heart diseases: Secondary | ICD-10-CM | POA: Diagnosis not present

## 2018-02-27 HISTORY — PX: NM MYOVIEW LTD: HXRAD82

## 2018-02-27 LAB — MYOCARDIAL PERFUSION IMAGING
CHL CUP NUCLEAR SDS: 3
CHL CUP RESTING HR STRESS: 50 {beats}/min
LV dias vol: 100 mL (ref 46–106)
LV sys vol: 32 mL
NUC STRESS TID: 1.08
Peak HR: 81 {beats}/min
SRS: 0
SSS: 3

## 2018-02-27 MED ORDER — METOPROLOL SUCCINATE ER 25 MG PO TB24: 25.0000 mg | ORAL_TABLET | Freq: Every day | ORAL | 0 refills | Status: DC

## 2018-02-27 MED ORDER — REGADENOSON 0.4 MG/5ML IV SOLN
0.4000 mg | Freq: Once | INTRAVENOUS | Status: AC
  Administered 2018-02-27: 0.4 mg via INTRAVENOUS

## 2018-02-27 MED ORDER — TECHNETIUM TC 99M TETROFOSMIN IV KIT
30.8000 | PACK | Freq: Once | INTRAVENOUS | Status: AC | PRN
  Administered 2018-02-27: 30.8 via INTRAVENOUS
  Filled 2018-02-27: qty 31

## 2018-02-27 MED ORDER — TECHNETIUM TC 99M TETROFOSMIN IV KIT
10.8000 | PACK | Freq: Once | INTRAVENOUS | Status: AC | PRN
  Administered 2018-02-27: 10.8 via INTRAVENOUS
  Filled 2018-02-27: qty 11

## 2018-02-27 NOTE — Telephone Encounter (Signed)
Attempted to contact patient and let her know that I sent in the last dose we had on file for her to take metoprolol 25mg  and advised of what pharmacy it was sent to. Left message to call if dose was not correct or if any questions.

## 2018-02-27 NOTE — Telephone Encounter (Signed)
Patient returned previous call, discussed and verified medication dose.  Patient had no questions.

## 2018-02-27 NOTE — Telephone Encounter (Signed)
New Message: ° ° ° °Patient is returning a call °

## 2018-03-04 ENCOUNTER — Encounter: Payer: Self-pay | Admitting: Family Medicine

## 2018-03-04 ENCOUNTER — Ambulatory Visit (INDEPENDENT_AMBULATORY_CARE_PROVIDER_SITE_OTHER): Payer: Medicare HMO | Admitting: Family Medicine

## 2018-03-04 VITALS — BP 144/64 | HR 58 | Temp 97.8°F | Ht 61.0 in | Wt 211.8 lb

## 2018-03-04 DIAGNOSIS — M8580 Other specified disorders of bone density and structure, unspecified site: Secondary | ICD-10-CM

## 2018-03-04 DIAGNOSIS — E559 Vitamin D deficiency, unspecified: Secondary | ICD-10-CM | POA: Diagnosis not present

## 2018-03-04 DIAGNOSIS — R0602 Shortness of breath: Secondary | ICD-10-CM | POA: Diagnosis not present

## 2018-03-04 DIAGNOSIS — Z1212 Encounter for screening for malignant neoplasm of rectum: Secondary | ICD-10-CM

## 2018-03-04 DIAGNOSIS — Z1159 Encounter for screening for other viral diseases: Secondary | ICD-10-CM | POA: Diagnosis not present

## 2018-03-04 DIAGNOSIS — Z23 Encounter for immunization: Secondary | ICD-10-CM | POA: Diagnosis not present

## 2018-03-04 DIAGNOSIS — R5383 Other fatigue: Secondary | ICD-10-CM | POA: Diagnosis not present

## 2018-03-04 DIAGNOSIS — Z1211 Encounter for screening for malignant neoplasm of colon: Secondary | ICD-10-CM | POA: Diagnosis not present

## 2018-03-04 DIAGNOSIS — E785 Hyperlipidemia, unspecified: Secondary | ICD-10-CM | POA: Diagnosis not present

## 2018-03-04 DIAGNOSIS — R5381 Other malaise: Secondary | ICD-10-CM | POA: Diagnosis not present

## 2018-03-04 LAB — CBC WITH DIFFERENTIAL/PLATELET
Basophils Absolute: 0.1 10*3/uL (ref 0.0–0.1)
Basophils Relative: 0.9 % (ref 0.0–3.0)
Eosinophils Absolute: 0.2 10*3/uL (ref 0.0–0.7)
Eosinophils Relative: 3.2 % (ref 0.0–5.0)
HCT: 45.6 % (ref 36.0–46.0)
Hemoglobin: 15.4 g/dL — ABNORMAL HIGH (ref 12.0–15.0)
Lymphocytes Relative: 18.4 % (ref 12.0–46.0)
Lymphs Abs: 1.1 10*3/uL (ref 0.7–4.0)
MCHC: 33.8 g/dL (ref 30.0–36.0)
MCV: 89.8 fl (ref 78.0–100.0)
Monocytes Absolute: 0.5 10*3/uL (ref 0.1–1.0)
Monocytes Relative: 9.4 % (ref 3.0–12.0)
Neutro Abs: 3.9 10*3/uL (ref 1.4–7.7)
Neutrophils Relative %: 68.1 % (ref 43.0–77.0)
Platelets: 204 10*3/uL (ref 150.0–400.0)
RBC: 5.07 Mil/uL (ref 3.87–5.11)
RDW: 14.8 % (ref 11.5–15.5)
WBC: 5.8 10*3/uL (ref 4.0–10.5)

## 2018-03-04 LAB — COMPREHENSIVE METABOLIC PANEL
ALT: 28 U/L (ref 0–35)
AST: 19 U/L (ref 0–37)
Albumin: 4.4 g/dL (ref 3.5–5.2)
Alkaline Phosphatase: 66 U/L (ref 39–117)
BUN: 16 mg/dL (ref 6–23)
CO2: 27 mEq/L (ref 19–32)
Calcium: 9.3 mg/dL (ref 8.4–10.5)
Chloride: 105 mEq/L (ref 96–112)
Creatinine, Ser: 0.93 mg/dL (ref 0.40–1.20)
GFR: 63.17 mL/min (ref >60.00)
Glucose, Bld: 94 mg/dL (ref 70–99)
Potassium: 4 mEq/L (ref 3.5–5.1)
Sodium: 139 mEq/L (ref 135–145)
Total Bilirubin: 0.5 mg/dL (ref 0.2–1.2)
Total Protein: 7.5 g/dL (ref 6.0–8.3)

## 2018-03-04 LAB — TSH: TSH: 2.21 u[IU]/mL (ref 0.35–4.50)

## 2018-03-04 LAB — VITAMIN B12: Vitamin B-12: 377 pg/mL (ref 211–911)

## 2018-03-04 LAB — VITAMIN D 25 HYDROXY (VIT D DEFICIENCY, FRACTURES): VITD: 22.68 ng/mL — ABNORMAL LOW (ref 30.00–100.00)

## 2018-03-04 NOTE — Progress Notes (Signed)
Cindy Reyes is a 72 y.o. female is here for follow up.  History of Present Illness:   Cindy Reyes, CMA acting as scribe for Dr. Briscoe Reyes.   HPI: Patient in office for follow up. She states that she has seen cardiology and got a good report. She is still having trouble walking more than a block at a time. She would like referral to pulmonology for evaluation. She states that she does have a lot of allergies. She does have some pain in leg with walking.  At last office visit she was given inhaler but she never started. She states she did not want to take without knowing what is wrong with her lungs.   She decided not to take the Depakote. She is doing well with working on centering herself. She did try the vitamin D and thiamine but did not like it.   Last bone density was reviewed with patient. All questions were answered. Dx osteopenia. Due for recheck June 2020.  Health Maintenance Due  Topic Date Due  . Hepatitis C Screening  [birth year]  . COLONOSCOPY  03/21/1997  . INFLUENZA VACCINE  01/09/2018   Depression screen Cindy Reyes 2/9 11/19/2017 07/11/2017 11/16/2016  Decreased Interest 0 1 0  Down, Depressed, Hopeless 0 1 0  PHQ - 2 Score 0 2 0  Altered sleeping - 2 -  Tired, decreased energy - 2 -  Change in appetite - 0 -  Feeling bad or failure about yourself  - 1 -  Trouble concentrating - 1 -  Moving slowly or fidgety/restless - 0 -  Suicidal thoughts - 0 -  PHQ-9 Score - 8 -   PMHx, SurgHx, SocialHx, FamHx, Medications, and Allergies were reviewed in the Visit Navigator and updated as appropriate.   Patient Active Problem List   Diagnosis Date Noted  . Osteopenia, due for DEXA 11/2018 03/04/2018  . DOE (dyspnea on exertion) 02/20/2018  . Arthralgia of multiple joints, worst in hips and shoulders, controlled with Tai Chi 08/17/2017  . Diastolic dysfunction, EF 34% 08/17/2017  . Status post subtotal parathyroidectomy (Cindy Reyes) 11/16/2016  . Essential hypertension, on  Cindy Reyes and Cindy 11/16/2016  . Hyperlipidemia, previously on Crestor but intolerant 11/16/2016  . History of nephrolithiasis 01/27/2016  . Nonrheumatic aortic valve insufficiency 01/27/2016  . Vitamin D deficiency 01/27/2016  . History of hyperparathyroidism, s/p removal of one parathyroid 01/25/2016  . Mild cognitive impairment 01/09/2016  . 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 Atherosclerotic heart disease of native coronary artery with angina pectoris (Cindy Reyes) 01/05/2016  . Bipolar disorder, currently in remission (Cindy Reyes) 01/05/2016  . Hypercalcemia 01/05/2016  . Nonrheumatic aortic valve stenosis 01/05/2016  . 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 Smoking status: Never Smoker  . Smokeless tobacco: Never Used  Substance Use Topics  . Alcohol use: No  . Drug use: No   Current Medications and Allergies:   .  amLODipine (Cindy Reyes) 10 MG tablet, Take 1 tablet (10 mg total) by mouth at bedtime., Disp: 30 tablet, Rfl: 6 .  metoprolol succinate (Cindy Reyes) 25 MG 24 hr tablet, Take 1 tablet (25 mg total) by mouth daily., Disp: 30 tablet, Rfl: 0   Allergies  Allergen Reactions  . Atorvastatin Other (See Comments)    HBP, Headache  . Coconut Fatty Acids   . Lidocaine Other (See Comments)    Hallucination  . Statins     Head ache   .  Latex Itching  . Losartan Other (See Comments)    HBP, Headache  . Penicillins Hives and Itching    Reaction Cindy Reyes Has patient had a PCN reaction causing immediate rash, facial/tongue/throat swelling, SOB or lightheadedness with hypotension: Yes Has patient had a PCN reaction causing severe rash involving mucus membranes or skin necrosis: No Has patient had a PCN reaction that required hospitalization: No Has patient had a PCN reaction occurring within the last 10 years: No If all of the above answers are "NO", then may proceed with Cephalosporin use.    Review of  Systems   Pertinent items are noted in the HPI. Otherwise, ROS is negative.  Vitals:   Vitals:   03/04/18 1105  BP: (!) 144/64  Pulse: (!) 58  Temp: 97.8 F (36.6 C)  TempSrc: Oral  SpO2: 95%  Weight: 211 lb 12.8 oz (96.1 kg)  Height: '5\' 1"'  (1.549 m)     Body mass index is 40.02 kg/m.  Physical Exam:   Physical Exam  Constitutional: She appears well-nourished.  HENT:  Head: Normocephalic and atraumatic.  Eyes: Pupils are equal, round, and reactive to light. EOM are normal.  Neck: Normal range of motion. Neck supple.  Cardiovascular: Normal rate, regular rhythm and intact distal pulses.  Murmur heard. Pulmonary/Chest: Effort normal.  Abdominal: Soft.  Skin: Skin is warm.  Psychiatric: She has a normal mood and affect. Her behavior is normal.  Nursing note and vitals reviewed.  Assessment and Plan:   [name] was seen today for follow-up.  Diagnoses and all orders for this visit:  SOB (shortness of breath) Comments: With exertion. Cardiac evaluation recently. Reviewed with patient. Concern for bronchospasm/restriction. Will ask Pulmonology to evaluate. Orders: -     Ambulatory referral to Pulmonology  Screening for colorectal cancer -     Ambulatory referral to Gastroenterology  Vitamin D deficiency -     VITAMIN D 25 Hydroxy (Vit-D Deficiency, Fractures)  Malaise and fatigue -     CBC with Differential/Platelet -     Comprehensive metabolic panel -     Vitamin B12  Need for hepatitis C screening test -     Hepatitis C antibody  Encounter for immunization -     Flu vaccine HIGH DOSE PF  Osteopenia Comments: Reviewed DEXA with patient. Due next year.   Hyperlipidemia, previously on Crestor but intolerant  Morbid obesity (Cindy Reyes) Comments: Encouraged exercise and healthy food choices.    . Reviewed expectations re: course of current medical issues. . Discussed self-management of symptoms. . Outlined signs and symptoms indicating need for more acute  intervention. . Patient verbalized understanding and all questions were answered. Marland Kitchen Health Maintenance issues including appropriate healthy diet, exercise, and smoking avoidance were discussed with patient. . See orders for this visit as documented in the electronic medical record. . Patient received an After Visit Summary.  CMA served as Education administrator during this visit. History, Physical, and Plan performed by medical provider. The above documentation has been reviewed and is accurate and complete. Cindy Reyes, D.O.  Cindy Deutscher, DO [place], [facility] 03/04/2018

## 2018-03-04 NOTE — Addendum Note (Signed)
Addended by: Durwin Glaze on: 03/04/2018 12:30 PM   Modules accepted: Orders

## 2018-03-04 NOTE — Patient Instructions (Signed)
..  It takes about 2 weeks for protection to develop after vaccination.  There are many flu viruses, and they are always changing. Each year a new flu vaccine is made to protect against three or four viruses that are likely to cause disease in the upcoming flu season. Even when the vaccine doesn't exactly match these viruses, it may still provide some protection.   Influenza vaccine does not cause flu.  Influenza vaccine may be given at the same time as other vaccines.  3. Talk with your health care provider  Tell your vaccine provider if the person getting the vaccine: ; Has had an allergic reaction after a previous dose of influenza vaccine, or has any severe, life-threatening allergies.  ; Has ever had Guillain-Barr Syndrome (also called GBS).  In some cases, your health care provider may decide to postpone influenza vaccination to a future visit.  People with minor illnesses, such as a cold, may be vaccinated. People who are moderately or severely ill should usually wait until they recover before getting influenza vaccine.  Your health care provider can give you more information.  4. Risks of a reaction  ; Soreness, redness, and swelling where shot is given, fever, muscle aches, and headache can happen after influenza vaccine. ; There may be a very small increased risk of Guillain-Barr Syndrome (GBS) after inactivated influenza vaccine (the flu shot).  Young children who get the flu shot along with pneumococcal vaccine (PCV13), and/or DTaP vaccine at the same time might be slightly more likely to have a seizure caused by fever. Tell your health care provider if a child who is getting flu vaccine has ever had a seizure.  People sometimes faint after medical procedures, including vaccination. Tell your provider if you feel dizzy or have vision changes or ringing in the ears.  As with any medicine, there is a very remote chance of a vaccine causing a severe allergic reaction, other  serious injury, or death.  5. What if there is a serious problem?  An allergic reaction could occur after the vaccinated person leaves the clinic. If you see signs of a severe allergic reaction (hives, swelling of the face and throat, difficulty breathing, a fast heartbeat, dizziness, or weakness), call 9-1-1 and get the person to the nearest hospital.  For other signs that concern you, call your health care provider.  Adverse reactions should be reported to the Vaccine Adverse Event Reporting System (VAERS). Your health care provider will usually file this report, or you can do it yourself. Visit the VAERS website at www.vaers.hhs.gov or call 1-800-822-7967.  VAERS is only for reporting reactions, and VAERS staff do not give medical advice.  6. The National Vaccine Injury Compensation Program  The National Vaccine Injury Compensation Program (VICP) is a federal program that was created to compensate people who may have been injured by certain vaccines. Visit the VICP website at www.hrsa.gov/vaccinecompensation or call 1-800-338-2382 to learn about the program and about filing a claim. There is a time limit to file a claim for compensation.  7. How can I learn more?  ; Ask your health care provider.  ; Call your local or state health department. ; Contact the Centers for Disease Control and Prevention (CDC): - Call 1-800-232-4636 (1-800-CDC-INFO) or - Visit CDC's influenza website at www.cdc.gov/flu  Vaccine Information Statement (Interim) Inactivated Influenza Vaccine  01/23/2018 42 U.S.C.  300aa-26   Department of Health and Human Services Centers for Disease Control and Prevention  Office Use Only  

## 2018-03-05 LAB — HEPATITIS C ANTIBODY
Hepatitis C Ab: NONREACTIVE
SIGNAL TO CUT-OFF: 0.01 (ref <1.00)

## 2018-03-06 ENCOUNTER — Other Ambulatory Visit: Payer: Self-pay

## 2018-03-06 MED ORDER — CHOLECALCIFEROL 1.25 MG (50000 UT) PO TABS: ORAL_TABLET | ORAL | 0 refills | Status: DC

## 2018-03-12 ENCOUNTER — Ambulatory Visit: Payer: Medicare HMO

## 2018-03-12 NOTE — Progress Notes (Deleted)
03/12/2018 Cindy Reyes [birth year] [id number]   HPI:  Cindy Reyes is a 72 y.o. female patient of Dr Ellyn Hack, with a PMH below who presents today for hypertension and lipid clinic evaluation.  In addition to this, her medical history is significant for CAD (s/p CABG 2006), aortic stenosis,  migraines, bipolar disorder  Blood Pressure Goal:  130/80  Current Medications:  Amlodipine 10 mg   Metoprolol succ 25 mg qd  Previously tried:    Family Hx:  Social Hx:  Diet:  Exercise:  Home BP readings:  Intolerances:   Labs:  Wt Readings from Last 3 Encounters:  03/04/18 211 lb 12.8 oz (96.1 kg)  02/27/18 212 lb (96.2 kg)  02/20/18 212 lb (96.2 kg)   BP Readings from Last 3 Encounters:  03/04/18 (!) 144/64  02/20/18 (!) 158/62  11/19/17 (!) 144/60   Pulse Readings from Last 3 Encounters:  03/04/18 (!) 58  02/20/18 64  11/19/17 61    Current Outpatient Medications  Medication Sig Dispense Refill  . amLODipine (NORVASC) 10 MG tablet Take 1 tablet (10 mg total) by mouth at bedtime. 30 tablet 6  . aspirin EC 81 MG tablet Take 81 mg by mouth daily.    . Cholecalciferol 50000 units TABS 50,000 units PO qwk for 8 weeks. 12 tablet 0  . metoprolol succinate (TOPROL XL) 25 MG 24 hr tablet Take 1 tablet (25 mg total) by mouth daily. 30 tablet 0  . OVER THE COUNTER MEDICATION Take 1 tablet by mouth daily as needed (migraine). OTC migraine medication with caffeine     No current facility-administered medications for this visit.     Allergies  Allergen Reactions  . Atorvastatin Other (See Comments)    HBP, Headache  . Coconut Fatty Acids   . Lidocaine Other (See Comments)    Hallucination  . Statins     Head ache   . Latex Itching  . Losartan Other (See Comments)    HBP, Headache  . Penicillins Hives and Itching    Reaction [place] Has patient had a PCN reaction causing immediate rash, facial/tongue/throat swelling, SOB or lightheadedness with  hypotension: Yes Has patient had a PCN reaction causing severe rash involving mucus membranes or skin necrosis: No Has patient had a PCN reaction that required hospitalization: No Has patient had a PCN reaction occurring within the last 10 years: No If all of the above answers are "NO", then may proceed with Cephalosporin use.     Past Medical History:  Diagnosis Date  . Allergy   . Anemia   . Aortic stenosis, mild 12/2015   Mild AS & mod MR by Echo  . Asthma   . Bipolar disorder without psychotic features ([place])   . Blood transfusion without reported diagnosis   . CAD, multiple vessel 2006   Referred for CABG-[place], [place] (report not available).  Myoview November 2017 nonischemic with breast attenuation.  Normal EF.  . Colon polyps   . Depression   . GAD (generalized anxiety disorder)   . History of nephrolithiasis   . Hyperlipidemia    Not currently on medications.  . Hyperparathyroidism ([place]) 2017   Parathyroid tumor  . Hypertension   . Migraine   . Osteopenia   . 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 Thyroid disease    Not currently on replacement therapy.  . Vitamin D deficiency     There were no vitals taken for this  visit.  No problem-specific Assessment & Plan notes found for this encounter.   Tommy Medal PharmD CPP [facility] [address] [place], [place] [address] [phone]

## 2018-03-19 ENCOUNTER — Other Ambulatory Visit: Payer: Self-pay | Admitting: Cardiology

## 2018-03-25 ENCOUNTER — Ambulatory Visit: Payer: Medicare HMO | Admitting: Cardiology

## 2018-03-25 ENCOUNTER — Encounter: Payer: Self-pay | Admitting: Cardiology

## 2018-03-25 VITALS — BP 148/76 | HR 70 | Ht 61.0 in | Wt 212.8 lb

## 2018-03-25 DIAGNOSIS — I350 Nonrheumatic aortic (valve) stenosis: Secondary | ICD-10-CM

## 2018-03-25 DIAGNOSIS — E785 Hyperlipidemia, unspecified: Secondary | ICD-10-CM | POA: Diagnosis not present

## 2018-03-25 DIAGNOSIS — R0600 Dyspnea, unspecified: Secondary | ICD-10-CM

## 2018-03-25 DIAGNOSIS — Z951 Presence of aortocoronary bypass graft: Secondary | ICD-10-CM | POA: Diagnosis not present

## 2018-03-25 DIAGNOSIS — R0609 Other forms of dyspnea: Secondary | ICD-10-CM

## 2018-03-25 DIAGNOSIS — I25119 Atherosclerotic heart disease of native coronary artery with unspecified angina pectoris: Secondary | ICD-10-CM | POA: Diagnosis not present

## 2018-03-25 DIAGNOSIS — I5189 Other ill-defined heart diseases: Secondary | ICD-10-CM

## 2018-03-25 DIAGNOSIS — I10 Essential (primary) hypertension: Secondary | ICD-10-CM | POA: Diagnosis not present

## 2018-03-25 NOTE — Patient Instructions (Signed)
Medication Instructions:  NOT NEEDED If you need a refill on your cardiac medications before your next appointment, please call your pharmacy.   Lab work: NOT NEEDED If you have labs (blood work) drawn today and your tests are completely normal, you will receive your results only by: Marland Kitchen MyChart Message (if you have MyChart) OR . A paper copy in the mail If you have any lab test that is abnormal or we need to change your treatment, we will call you to review the results.  Testing/Procedures: NOT NEEDED  Follow-Up: At [facility], you and your health needs are our priority.  As part of our continuing mission to provide you with exceptional heart care, we have created designated Provider Care Teams.  These Care Teams include your primary Cardiologist (physician) and Advanced Practice Providers (APPs -  Physician Assistants and Nurse Practitioners) who all work together to provide you with the care you need, when you need it.  . Your physician recommends that you schedule a follow-up appointment in 4 - [place] . You have been referred to [place]. Marland Kitchen You have been referred to [facility] - LIPIDS AND BLOOD PREESURE  Any Other Special Instructions Will Be Listed Below (If Applicable).
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
## 2018-03-31 ENCOUNTER — Encounter: Payer: Self-pay | Admitting: Cardiology

## 2018-03-31 NOTE — Assessment & Plan Note (Signed)
Clearly contributing to dyspnea and her arthritis pains as well as hyperlipidemia.  Discussed importance of dietary adjustment and continued attempts of exercise.  Tai chi is helpful, but probably needs to do some type of calorie burning exercise as well such as water aerobics)

## 2018-03-31 NOTE — Assessment & Plan Note (Signed)
Now 2 sequential negative Myoview's.  CABG report reviewed.  It appears that there is significant disease in the RCA requiring endarterectomy of the distal RCA into the PDA and PL prior to grafting.  The Ramus Intermedius As Well as D1 and D3 were also grafted.  This would suggest minimal disease in the circumflex system. Unfortunately, I do not have the preop cath report (only the cardiac calcium score)

## 2018-03-31 NOTE — Assessment & Plan Note (Signed)
Finally we had the CABG report to review.  She has now had 2- Myoview stress test 1 done at [facility] and 1 here now recently.  Not having her true anginal symptoms.  At this point they will continue medical management.  She is on amlodipine and metoprolol for angina relief and doing well.  She is also on aspirin.

## 2018-03-31 NOTE — Assessment & Plan Note (Signed)
She does have a lot of dyspnea but also cough and wheezing.  Since her echo was relatively normal along with her Myoview be nonischemic, I think that she would probably benefit from pulmonary evaluation.  We will refer to pulmonary medicine.

## 2018-03-31 NOTE — Assessment & Plan Note (Signed)
I will refer to CV RR lipid clinic to discuss medication interactions of statins and her hypercalcemia issue.  Can also discussed the possibility of PCSK9 inhibitor.

## 2018-03-31 NOTE — Assessment & Plan Note (Signed)
Mild to moderate aortic stenosis by echo.  Could probably follow-up in 2 years.  Also has mild to moderate aortic insufficiency.  This will also be followed.

## 2018-03-31 NOTE — Assessment & Plan Note (Signed)
Blood pressure is little bit up today, but she has not taken her morning medications and is somewhat stressed out having rush to get in here.  She says at home her blood pressures are much more in the normal range.  For now we will continue to monitor her pressures, but would potentially have room to increase Toprol if necessary.

## 2018-04-01 ENCOUNTER — Encounter: Payer: Self-pay | Admitting: Cardiology

## 2018-04-08 ENCOUNTER — Ambulatory Visit: Payer: Medicare HMO | Admitting: Pharmacist Clinician (PhC)/ Clinical Pharmacy Specialist

## 2018-04-08 DIAGNOSIS — E785 Hyperlipidemia, unspecified: Secondary | ICD-10-CM | POA: Diagnosis not present

## 2018-04-08 DIAGNOSIS — I10 Essential (primary) hypertension: Secondary | ICD-10-CM

## 2018-04-08 NOTE — Progress Notes (Signed)
04/09/2018 Cindy Reyes 1945-07-31 [id number]   HPI:  Sybol Morre is a 72 y.o. female patient of Dr Ellyn Hack, with a PMH below who presents today for hypertension and hyperlipidemia clinic evaluation.  In addition to these, her medical history is significant for ASCVD (s/p CABG x 4 in 2006), aortic valve stenosis, bipolar disorder, hyperparathyroidism, and obesity.  She moved to the [place] area about 2 years ago after having lived in [place] for a few years.  Her CABG was done in [place].    Patient describes herself as having sensitivities to multiple medications, states that with most medications she will develop headache, increased chest tightness and increased BP.  She believes that she has done better with brand name drugs in the past, but cannot recall all the different BP or lipid medications she has tried.    Blood pressure needs to be checked on right arm.  Had previous break of left arm at elbow, has some issues with blood flow to left arm.  Blood Pressure Goal:  130/80  Current Medications:  Amlodipine 10 mg   Metoprolol succ 25 mg qd   No lipid lowering medications  Family Hx:  Father died at 91 from heart disease (unsure if MI or CHF, was in the 26's)  Mother lived into 33's , hypertension  Brother had heart surgery, but otherwise doing well  2 children, no problems yet  Social Hx:  No tobacco; no alcohol; rare caffeine  Diet:  Mix of home/out; currently on keto diet since February, hasn't lost much weight; admits that she has days where she doesn't follow closely.    Exercise:  Tai chi three times per week  Home BP readings:  Has wrist cuff, not sure if accurate, so she has not been using  Intolerances:   Atorvastatin, rosuvastatin both caused headaches  Labs:  10/2017:  TC 253, TG 110, HDL 51, LDL 180  02/2018:  Na 139, K 4.0, Glu 94, BUN 16, SCr 0.93  Wt Readings from Last 3 Encounters:  03/25/18 212 lb 12.8 oz (96.5 kg)    03/04/18 211 lb 12.8 oz (96.1 kg)  02/27/18 212 lb (96.2 kg)   BP Readings from Last 3 Encounters:  04/08/18 (!) 158/72  03/25/18 (!) 148/76  03/04/18 (!) 144/64   Pulse Readings from Last 3 Encounters:  03/25/18 70  03/04/18 (!) 58  02/20/18 64    Current Outpatient Medications  Medication Sig Dispense Refill  . amLODipine (NORVASC) 10 MG tablet Take 1 tablet (10 mg total) by mouth at bedtime. 30 tablet 6  . aspirin EC 81 MG tablet Take 81 mg by mouth daily.    . Cholecalciferol 50000 units TABS 50,000 units PO qwk for 8 weeks. 12 tablet 0  . metoprolol succinate (TOPROL-XL) 25 MG 24 hr tablet TAKE 1 TABLET BY MOUTH EVERY DAY 30 tablet 0  . OVER THE COUNTER MEDICATION Take 1 tablet by mouth daily as needed (migraine). OTC migraine medication with caffeine     No current facility-administered medications for this visit.     Allergies  Allergen Reactions  . Atorvastatin Other (See Comments)    HBP, Headache  . Coconut Fatty Acids   . Lidocaine Other (See Comments)    Hallucination  . Statins     Head ache   . Latex Itching  . Losartan Other (See Comments)    HBP, Headache  . Penicillins Hives and Itching    Reaction [place] Has  patient had a PCN reaction causing immediate rash, facial/tongue/throat swelling, SOB or lightheadedness with hypotension: Yes Has patient had a PCN reaction causing severe rash involving mucus membranes or skin necrosis: No Has patient had a PCN reaction that required hospitalization: No Has patient had a PCN reaction occurring within the last 10 years: No If all of the above answers are "NO", then may proceed with Cephalosporin use.     Past Medical History:  Diagnosis Date  . Allergy   . Anemia   . Aortic stenosis, mild 12/2015   Mild AS & mod MR by Echo  . Asthma   . Bipolar disorder without psychotic features ([place])   . Blood transfusion without reported diagnosis   . CAD, multiple vessel 2006   Referred for  CABG-[place], [place] (report not available).  Myoview November 2017 nonischemic with breast attenuation.  Normal EF.  . Colon polyps   . Depression   . GAD (generalized anxiety disorder)   . History of nephrolithiasis   . Hyperlipidemia    Not currently on medications.  . Hyperparathyroidism ([place]) 2017   Parathyroid tumor  . Hypertension   . Migraine   . Osteopenia   . 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 Thyroid disease    Not currently on replacement therapy.  . Vitamin D deficiency     Blood pressure (!) 158/72.  Essential hypertension, on Norvasc and Toprol Patient with multiple sensitivities, apparently not all listed in allergy section.  Because she prefers brand name drugs, will have her try sample of Edarbi 40 mg once daily.  She is also to take her BP every morning and was given instructions on proper technique.  She will record these for [place] and return in 2 weeks for follow up.  If this works for her, there still may be some challenge in getting her Medicare plan to pay for a brand ARB.    Hyperlipidemia, previously on Crestor but intolerant Reviewed labs at length with patient.  Advised that there is some familial component with her LDL at 180 and advised that she make her children aware to monitor their levels.  Patient not interested in trying rosuvastatin weekly and tapering upward, nor is she interested in PCSK-9 inhibitors.  There is the option of brand Livalo that we can probably get her to try, however with her challenges in staying on medication, I do not want to add 2 medications at the same time.  She prefers to wait until we get her BP better controlled before moving on to address her lipid profile.     Tommy Medal PharmD CPP [facility] [address] [place], [place] [address] [phone]

## 2018-04-08 NOTE — Patient Instructions (Addendum)
Return for a a follow up appointment in 2 weeks  Your blood pressure today is 158/72  Check your blood pressure at home daily and keep record of the readings.  Take your BP meds as follows:  Take the Edarbi 40 mg once daily in the mornings  Continue with amlodipine in the evenings  Bring all of your meds, your BP cuff and your record of home blood pressures to your next appointment.  Exercise as you're able, try to walk approximately 30 minutes per day.  Keep salt intake to a minimum, especially watch canned and prepared boxed foods.  Eat more fresh fruits and vegetables and fewer canned items.  Avoid eating in fast food restaurants.    HOW TO TAKE YOUR BLOOD PRESSURE: . Rest 5 minutes before taking your blood pressure. .  Don't smoke or drink caffeinated beverages for at least 30 minutes before. . Take your blood pressure before (not after) you eat. . Sit comfortably with your back supported and both feet on the floor (don't cross your legs). . Elevate your arm to heart level on a table or a desk. . Use the proper sized cuff. It should fit smoothly and snugly around your bare upper arm. There should be enough room to slip a fingertip under the cuff. The bottom edge of the cuff should be 1 inch above the crease of the elbow. . Ideally, take 3 measurements at one sitting and record the average.

## 2018-04-09 ENCOUNTER — Encounter: Payer: Self-pay | Admitting: Pharmacist Clinician (PhC)/ Clinical Pharmacy Specialist

## 2018-04-09 NOTE — Assessment & Plan Note (Signed)
Patient with multiple sensitivities, apparently not all listed in allergy section.  Because she prefers brand name drugs, will have her try sample of Edarbi 40 mg once daily.  She is also to take her BP every morning and was given instructions on proper technique.  She will record these for [place] and return in 2 weeks for follow up.  If this works for her, there still may be some challenge in getting her Medicare plan to pay for a brand ARB.

## 2018-04-09 NOTE — Assessment & Plan Note (Signed)
Reviewed labs at length with patient.  Advised that there is some familial component with her LDL at 180 and advised that she make her children aware to monitor their levels.  Patient not interested in trying rosuvastatin weekly and tapering upward, nor is she interested in PCSK-9 inhibitors.  There is the option of brand Livalo that we can probably get her to try, however with her challenges in staying on medication, I do not want to add 2 medications at the same time.  She prefers to wait until we get her BP better controlled before moving on to address her lipid profile.

## 2018-04-10 ENCOUNTER — Encounter: Payer: Self-pay | Admitting: Internal Medicine

## 2018-04-10 ENCOUNTER — Other Ambulatory Visit (INDEPENDENT_AMBULATORY_CARE_PROVIDER_SITE_OTHER): Payer: Medicare HMO

## 2018-04-10 ENCOUNTER — Ambulatory Visit: Payer: Medicare HMO | Admitting: Internal Medicine

## 2018-04-10 ENCOUNTER — Ambulatory Visit (INDEPENDENT_AMBULATORY_CARE_PROVIDER_SITE_OTHER)
Admission: RE | Admit: 2018-04-10 | Discharge: 2018-04-10 | Disposition: A | Discharge status: Home / Self Care | Payer: Medicare HMO | Source: Ambulatory Visit | Attending: Internal Medicine | Admitting: Internal Medicine

## 2018-04-10 DIAGNOSIS — R0609 Other forms of dyspnea: Secondary | ICD-10-CM | POA: Diagnosis not present

## 2018-04-10 DIAGNOSIS — J986 Disorders of diaphragm: Secondary | ICD-10-CM | POA: Diagnosis not present

## 2018-04-10 DIAGNOSIS — R0602 Shortness of breath: Secondary | ICD-10-CM | POA: Diagnosis not present

## 2018-04-10 DIAGNOSIS — R0600 Dyspnea, unspecified: Secondary | ICD-10-CM

## 2018-04-10 DIAGNOSIS — R05 Cough: Secondary | ICD-10-CM | POA: Diagnosis not present

## 2018-04-10 LAB — BRAIN NATRIURETIC PEPTIDE: PRO B NATRI PEPTIDE: 48 pg/mL (ref 0.0–100.0)

## 2018-04-10 MED ORDER — PANTOPRAZOLE SODIUM 40 MG PO TBEC: 40.0000 mg | DELAYED_RELEASE_TABLET | Freq: Every day | ORAL | 2 refills | Status: DC

## 2018-04-10 MED ORDER — FAMOTIDINE 20 MG PO TABS: ORAL_TABLET | ORAL | 11 refills | Status: AC

## 2018-04-10 NOTE — Patient Instructions (Addendum)
Pantoprazole (protonix) 40 mg   Take  30-60 min before first meal of the day and Pepcid (famotidine)  20 mg one @  bedtime until return to office - this is the best way to tell whether stomach acid is contributing to your problem.    GERD (REFLUX)  is an extremely common cause of respiratory symptoms just like yours , many times with no obvious heartburn at all.    It can be treated with medication, but also with lifestyle changes including elevation of the head of your bed (ideally with 6 inch  bed blocks),  Smoking cessation, avoidance of late meals, excessive alcohol, and avoid fatty foods, chocolate, peppermint, colas, red wine, and acidic juices such as orange juice.  NO MINT OR MENTHOL PRODUCTS SO NO COUGH DROPS   USE SUGARLESS CANDY INSTEAD (Jolley ranchers or Stover's or Life Savers) or even ice chips will also do - the key is to swallow to prevent all throat clearing. NO OIL BASED VITAMINS - use powdered substitutes.     Walk at a slower pace until we sort out why you get so short of breath but keep walking as much as you can    Please remember to go to the lab and x-ray department downstairs in the basement  for your tests - we will call you with the results when they are available.     Please schedule a follow up office visit in 4 weeks, call sooner if needed with full pfts on return   - sniff test needs to be done on return also for RHD elevation

## 2018-04-10 NOTE — Progress Notes (Signed)
Cindy Reyes, female    DOB: 05/26/1946,     MRN: [id number]    Brief patient profile:  65 yowf never smoker from [place] onset sob 2006 > back to [place]l p cabg but onset  [place]oe recurrent spring/summer of 2018 assoc with cough mostly in fall that started ? Shortly moved to [place] in retirement appts where she stayed ever since   Brief patient profile:  04/10/2018 Pulmonary/ 1st office eval  Chief Complaint  Patient presents with  . Pulmonary Consult    Referred by Dr. Ellyn Hack. Pt c/o SOB for the past several months.  She states she gets SOB sometimes just walking room to room.  She also c/o prod cough with yellow sputum for the past 6 wks.   Dyspnea:  X 50 ft indolent onset/ progressive  X 1.5 y " every time"> not reproduced in office, changed hx to sometimes  Cough: assoc with nasal congestion/ better with nasal sprays  / worst in ams Sleep: lie flat  SABA use: none    No obvious patterns in day to day or daytime variability or assoc   mucus plugs or hemoptysis or cp or chest tightness, subjective wheeze or overt sinus or hb symptoms.   Sleeping as above  without nocturnal    exacerbation  of respiratory  c/o's or need for noct saba. Also denies any obvious fluctuation of symptoms with weather or environmental changes or other aggravating or alleviating factors except as outlined above   No unusual exposure hx or h/o childhood pna/ asthma or knowledge of premature birth.  Current Allergies, Complete Past Medical History, Past Surgical History, Family History, and Social History were reviewed in [employer] record.  ROS  The following are not active complaints unless bolded Hoarseness, sore throat, dysphagia, dental problems, itching, sneezing,  nasal congestion or discharge of excess mucus or purulent secretions, ear ache,   fever, chills, sweats, unintended wt loss or wt gain, classically pleuritic or exertional cp,  orthopnea pnd or arm/hand swelling  or leg  swelling, presyncope, palpitations, abdominal pain, anorexia, nausea, vomiting, diarrhea  or change in bowel habits or change in bladder habits, change in stools or change in urine, dysuria, hematuria,  rash, arthralgias, visual complaints, headache random pattern, not typically in am , numbness, weakness or ataxia or problems with walking or coordination,  change in mood or  memory.           Past Medical History:  Diagnosis Date  . Allergy   . Anemia   . Aortic stenosis, mild 12/2015   Mild AS & mod MR by Echo  . Asthma   . Bipolar disorder without psychotic features ([place])   . Blood transfusion without reported diagnosis   . CAD, multiple vessel 2006   Referred for CABG-[place], [place] (report not available).  Myoview November 2017 nonischemic with breast attenuation.  Normal EF.  . Colon polyps   . Depression   . GAD (generalized anxiety disorder)   . History of nephrolithiasis   . Hyperlipidemia    Not currently on medications.  . Hyperparathyroidism ([place]) 2017   Parathyroid tumor  . Hypertension   . Migraine   . Osteopenia   . 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 Thyroid disease    Not currently on replacement therapy.  . Vitamin D deficiency     Outpatient Medications Prior to Visit  Medication Sig Dispense Refill  . amLODipine (NORVASC) 10 MG tablet Take  1 tablet (10 mg total) by mouth at bedtime. 30 tablet 6  . aspirin EC 81 MG tablet Take 81 mg by mouth daily.    . Cholecalciferol 50000 units TABS 50,000 units PO qwk for 8 weeks. 12 tablet 0  . metoprolol succinate (TOPROL-XL) 25 MG 24 hr tablet TAKE 1 TABLET BY MOUTH EVERY DAY 30 tablet 0  . OVER THE COUNTER MEDICATION Take 1 tablet by mouth daily as needed (migraine). OTC migraine medication with caffeine               Objective:     BP 110/62 (BP Location: Left Arm, Cuff Size: Normal)   Pulse 80   Ht 5\' 1"  (1.549 m)   Wt 212 lb (96.2 kg)   SpO2 95%   BMI 40.06  kg/m   SpO2: 95 % RA    Wt Readings from Last 3 Encounters:  04/10/18 212 lb (96.2 kg)  03/25/18 212 lb 12.8 oz (96.5 kg)  03/04/18 211 lb 12.8 oz (96.1 kg)   05/11/16          210     Obese pleasant wf who failed to answer questions asked in a straightforward manner, tending to go off on tangents or answer questions with ambiguous medical terms or diagnoses     HEENT: nl dentition, turbinates bilaterally, and oropharynx. Nl external ear canals without cough reflex   NECK :  without JVD/Nodes/TM/ nl carotid upstrokes bilaterally   LUNGS: no acc muscle use,  Nl contour chest which is clear to A and P bilaterally without cough on insp or exp maneuvers   CV:  RRR  no s3 or murmur or increase in P2, and no edema   ABD:  Obese / soft and nontender with nl inspiratory excursion in the supine position. No bruits or organomegaly appreciated, bowel sounds nl  MS:  Nl gait/ ext warm without deformities, calf tenderness, cyanosis or clubbing No obvious joint restrictions   SKIN: warm and dry without lesions    NEURO:  alert, approp, nl sensorium with  no motor or cerebellar deficits apparent.      CXR PA and Lateral:   04/10/2018 :    I personally reviewed images and agree with radiology impression as follows:    1.  Prior CABG.  Cardiomegaly normal pulmonary vascularity.  2. Mild right base subsegmental atelectasis and or scarring. Stable elevation right hemidiaphragm.  (dates back to 11/16/16 at least)    Labs ordered 04/10/2018  Allergy profile   Labs ordered/ reviewed:      Chemistry      Component Value Date/Time   NA 139 03/04/2018 1136   NA 144 11/06/2017 1215   K 4.0 03/04/2018 1136   CL 105 03/04/2018 1136   CO2 27 03/04/2018 1136   BUN 16 03/04/2018 1136   BUN 16 11/06/2017 1215   CREATININE 0.93 03/04/2018 1136      Component Value Date/Time   CALCIUM 9.3 03/04/2018 1136   CALCIUM 9.4 06/30/2017 2015   ALKPHOS 66 03/04/2018 1136   AST 19 03/04/2018  1136   ALT 28 03/04/2018 1136   BILITOT 0.5 03/04/2018 1136   BILITOT 0.4 11/06/2017 1215        Lab Results  Component Value Date   WBC 5.8 03/04/2018   HGB 15.4 (H) 03/04/2018   HCT 45.6 03/04/2018   MCV 89.8 03/04/2018   PLT 204.0 03/04/2018       EOS  0.2                                     03/04/18     Lab Results  Component Value Date   TSH 2.21 03/04/2018     Lab Results  Component Value Date   PROBNP 48.0 04/10/2018          Assessment   No problem-specific Assessment & Plan notes found for this encounter.     Christinia Gully, MD 04/10/2018

## 2018-04-11 ENCOUNTER — Encounter: Payer: Self-pay | Admitting: Internal Medicine

## 2018-04-11 DIAGNOSIS — J986 Disorders of diaphragm: Secondary | ICD-10-CM | POA: Insufficient documentation

## 2018-04-11 LAB — RESPIRATORY ALLERGY PROFILE REGION II ~~LOC~~
Allergen, D pternoyssinus,d7: <0.1 kU/L
Allergen, Mouse Urine Protein, e78: <0.1 kU/L
Allergen, Oak,t7: <0.1 kU/L
CLASS: 0
CLASS: 0
CLASS: 0
CLASS: 0
CLASS: 0
CLASS: 0
CLASS: 0
CLASS: 0
CLASS: 0
Class: 0
Class: 0
Class: 0
Class: 0
Class: 0
Class: 0
Class: 0
Class: 0
Class: 0
Class: 0
Class: 0
Class: 0
Class: 0
Class: 0
Class: 0
Cockroach: <0.1 kU/L
D. farinae: <0.1 kU/L
Dog Dander: <0.1 kU/L
IGE (IMMUNOGLOBULIN E), SERUM: 6 kU/L (ref <=114)
Johnson Grass: <0.1 kU/L
Sheep Sorrel IgE: <0.1 kU/L
Timothy Grass: <0.1 kU/L

## 2018-04-11 LAB — INTERPRETATION:

## 2018-04-11 NOTE — Assessment & Plan Note (Signed)
Echo  02/25/18  - Normal LV EF. Mild aortic stenosis with mild-moderate aortic   regurgitation. - 04/10/2018  Walked RA x 3 laps @ 185 ft each stopped due to  End of study, fast pace, no  desat   - mild sob at end  - Allergy profile 04/10/2018 >  Eos 0.2 /  IgE  - Spirometry 04/10/2018  FEV1 1.2 (61%)  Ratio 79 s curvature with no rx prior   - 04/10/2018 rec max rx for gerd and return for full pfts   Symptoms are markedly disproportionate to objective findings and not clear to what extent this is actually a pulmonary  problem but pt does appear to have difficult to sort out respiratory symptoms of unknown origin for which  DDX  = almost all start with A and  include Adherence, Ace Inhibitors, Acid Reflux, Active Sinus Disease, Alpha 1 Antitripsin deficiency, Anxiety masquerading as Airways dz,  ABPA,  Allergy(esp in young), Aspiration (esp in elderly), Adverse effects of meds,  Active smoking or Vaping, A bunch of PE's/clot burden (a few small clots can't cause this syndrome unless there is already severe underlying pulm or vascular dz with poor reserve),  Anemia or thyroid disorder, plus two Bs  = Bronchiectasis and Beta blocker use..and one C= CHF     Adherence is always the initial "prime suspect" and is a multilayered concern that requires a "trust but verify" approach in every patient - starting with knowing how to use medications, especially inhalers, correctly, keeping up with refills and understanding the fundamental difference between maintenance and prns vs those medications only taken for a very short course and then stopped and not refilled.  - return with all meds in hand using a trust but verify approach to confirm accurate Medication  Reconciliation The principal here is that until we are certain that the  patients are doing what we've asked, it makes no sense to ask them to do more.  ? Acid (or non-acid) GERD > always difficult to exclude as up to 75% of pts in some series report no  assoc GI/ Heartburn symptoms >>>> rec max (24h)  acid suppression and diet restrictions/ reviewed and instructions given in writing.    ? Allergy/asthma > send profile / hold off trial of bronchodilators based on today's spirometry and return for full pfts  ? Adverse drug effects> none of the usual suspects listed   ? Anxiety/depression/deconditioning> usually at the bottom of this list of usual suspects but should be much higher on this pt's based on H and P and   may interfere with adherence and also interpretation of response or lack thereof to symptom management which can be quite subjective.    ? Anemia/thyroid dz > excluded by lab review  ? A bunch of PE's > unlikely based on chronicity of this symptom given today's walking study which was nl  ? BB effects > unlikely on such low doses toprol with no obst on spirometry    ? chf > excluded with such a low bnp     >>> return in 4 weeks with full pfts

## 2018-04-11 NOTE — Assessment & Plan Note (Addendum)
Body mass index is 40.06 kg/m.   Lab Results  Component Value Date   TSH 2.21 03/04/2018     Contributing to gerd risk/ doe though note no real change in wt during the time she reports progressive doe/reviewed the need and the process to achieve and maintain neg calorie balance > defer f/u primary care including intermittently monitoring thyroid status

## 2018-04-11 NOTE — Progress Notes (Signed)
Spoke with pt and notified of results per Dr. Wert. Pt verbalized understanding and denied any questions. 

## 2018-04-11 NOTE — Assessment & Plan Note (Signed)
First seen in canopy system 11/16/16   Surprised she can sleep flat given obesity plus R HD changes - will need sniff test on return if not done and in meantime needs to work on wt.    Total time devoted to counseling  > 50 % of initial 60 min office visit:  review case with pt/ discussion of options/alternatives/ personally creating written customized instructions  in presence of pt  then going over those specific  Instructions directly with the pt including how to use all of the meds but in particular covering each new medication in detail and the difference between the maintenance= "automatic" meds and the prns using an action plan format for the latter (If this problem/symptom => do that organization reading Left to right).  Please see AVS from this visit for a full list of these instructions which I personally wrote for this pt and  are unique to this visit.

## 2018-04-14 NOTE — Progress Notes (Signed)
Spoke with pt and notified of results per Dr. Wert. Pt verbalized understanding and denied any questions. 

## 2018-04-15 ENCOUNTER — Ambulatory Visit (INDEPENDENT_AMBULATORY_CARE_PROVIDER_SITE_OTHER): Payer: Medicare HMO | Admitting: Physician Assistant

## 2018-04-15 ENCOUNTER — Encounter: Payer: Self-pay | Admitting: Physician Assistant

## 2018-04-15 VITALS — BP 130/86 | HR 71 | Temp 98.4°F | Ht 61.0 in | Wt 213.2 lb

## 2018-04-15 DIAGNOSIS — H6690 Otitis media, unspecified, unspecified ear: Secondary | ICD-10-CM

## 2018-04-15 MED ORDER — DOXYCYCLINE HYCLATE 100 MG PO TABS: 100.0000 mg | ORAL_TABLET | Freq: Two times a day (BID) | ORAL | 0 refills | Status: DC

## 2018-04-15 NOTE — Patient Instructions (Signed)
It was great to see you!  Start the oral doxycycline.  Please return if you are not improving as expected, or if you have high fevers (>101.5) or difficulty swallowing or worsening productive cough.  Call clinic with questions.  I hope you start feeling better soon!
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
## 2018-04-22 ENCOUNTER — Ambulatory Visit: Payer: Medicare HMO

## 2018-04-22 NOTE — Progress Notes (Deleted)
04/22/2018 Cindy Reyes [birth year] [id number]   HPI:  Cindy Reyes is a 72 y.o. female patient of Dr Ellyn Hack, with a PMH below who presents today for hypertension and hyperlipidemia clinic evaluation.  In addition to these, her medical history is significant for ASCVD (s/p CABG x 4 in 2006), aortic valve stenosis, bipolar disorder, hyperparathyroidism, and obesity.  She moved to the [place] area about 2 years ago after having lived in [place] for a few years.  Her CABG was done in [place].    Patient describes herself as having sensitivities to multiple medications, states that with most medications she will develop headache, increased chest tightness and increased BP.  She believes that she has done better with brand name drugs in the past, but cannot recall all the different BP or lipid medications she has tried.    Blood pressure needs to be checked on right arm.  Had previous break of left arm at elbow, has some issues with blood flow to left arm.  Blood Pressure Goal:  130/80  Current Medications:  Edarbi 40 mg  Amlodipine 10 mg   Metoprolol succ 25 mg qd   No lipid lowering medications  Family Hx:  Father died at 65 from heart disease (unsure if MI or CHF, was in the 80's)  Mother lived into 87's , hypertension  Brother had heart surgery, but otherwise doing well  2 children, no problems yet  Social Hx:  No tobacco; no alcohol; rare caffeine  Diet:  Mix of home/out; currently on keto diet since February, hasn't lost much weight; admits that she has days where she doesn't follow closely.    Exercise:  Tai chi three times per week  Home BP readings:  Has wrist cuff, not sure if accurate, so she has not been using  Intolerances:   Atorvastatin, rosuvastatin both caused headaches  Labs:  10/2017:  TC 253, TG 110, HDL 51, LDL 180  02/2018:  Na 139, K 4.0, Glu 94, BUN 16, SCr 0.93  Wt Readings from Last 3 Encounters:  04/15/18 213 lb 4 oz (96.7  kg)  04/10/18 212 lb (96.2 kg)  03/25/18 212 lb 12.8 oz (96.5 kg)   BP Readings from Last 3 Encounters:  04/15/18 130/86  04/10/18 110/62  04/08/18 (!) 158/72   Pulse Readings from Last 3 Encounters:  04/15/18 71  04/10/18 80  03/25/18 70    Current Outpatient Medications  Medication Sig Dispense Refill  . amLODipine (NORVASC) 10 MG tablet Take 1 tablet (10 mg total) by mouth at bedtime. 30 tablet 6  . aspirin EC 81 MG tablet Take 81 mg by mouth daily.    . Cholecalciferol 50000 units TABS 50,000 units PO qwk for 8 weeks. 12 tablet 0  . doxycycline (VIBRA-TABS) 100 MG tablet Take 1 tablet (100 mg total) by mouth 2 (two) times daily. 20 tablet 0  . famotidine (PEPCID) 20 MG tablet One at bedtime 30 tablet 11  . metoprolol succinate (TOPROL-XL) 25 MG 24 hr tablet TAKE 1 TABLET BY MOUTH EVERY DAY 30 tablet 0  . OVER THE COUNTER MEDICATION Take 1 tablet by mouth daily as needed (migraine). OTC migraine medication with caffeine    . pantoprazole (PROTONIX) 40 MG tablet Take 1 tablet (40 mg total) by mouth daily. Take 30-60 min before first meal of the day (Patient not taking: Reported on 04/15/2018) 30 tablet 2   No current facility-administered medications for this visit.     Allergies  Allergen Reactions  . Atorvastatin Other (See Comments)    HBP, Headache  . Coconut Fatty Acids   . Lidocaine Other (See Comments)    Hallucination  . Statins     Head ache   . Latex Itching  . Losartan Other (See Comments)    HBP, Headache  . Penicillins Hives and Itching    Reaction [place] Has patient had a PCN reaction causing immediate rash, facial/tongue/throat swelling, SOB or lightheadedness with hypotension: Yes Has patient had a PCN reaction causing severe rash involving mucus membranes or skin necrosis: No Has patient had a PCN reaction that required hospitalization: No Has patient had a PCN reaction occurring within the last 10 years: No If all of the above answers are  "NO", then may proceed with Cephalosporin use.     Past Medical History:  Diagnosis Date  . Allergy   . Anemia   . Aortic stenosis, mild 12/2015   Mild AS & mod MR by Echo  . Asthma   . Bipolar disorder without psychotic features ([place])   . Blood transfusion without reported diagnosis   . CAD, multiple vessel 2006   Referred for CABG-[place], [place] (report not available).  Myoview November 2017 nonischemic with breast attenuation.  Normal EF.  . Colon polyps   . Depression   . GAD (generalized anxiety disorder)   . History of nephrolithiasis   . Hyperlipidemia    Not currently on medications.  . Hyperparathyroidism ([place]) 2017   Parathyroid tumor  . Hypertension   . Migraine   . Osteopenia   . 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 Thyroid disease    Not currently on replacement therapy.  . Vitamin D deficiency     There were no vitals taken for this visit.  No problem-specific Assessment & Plan notes found for this encounter.   Tommy Medal PharmD CPP [facility] [address] [place], [place] [address] [phone]

## 2018-04-27 ENCOUNTER — Other Ambulatory Visit: Payer: Self-pay | Admitting: Cardiology

## 2018-05-27 ENCOUNTER — Ambulatory Visit: Payer: Self-pay | Admitting: Internal Medicine

## 2018-05-29 ENCOUNTER — Other Ambulatory Visit: Payer: Self-pay | Admitting: Family Medicine

## 2018-06-02 ENCOUNTER — Encounter: Payer: Self-pay | Admitting: Family Medicine

## 2018-07-28 ENCOUNTER — Encounter: Payer: Self-pay | Admitting: Cardiology

## 2018-07-28 ENCOUNTER — Ambulatory Visit: Payer: Medicare Other | Admitting: Cardiology

## 2018-07-28 ENCOUNTER — Encounter (INDEPENDENT_AMBULATORY_CARE_PROVIDER_SITE_OTHER): Payer: Self-pay

## 2018-07-28 VITALS — BP 162/70 | HR 71 | Ht 61.0 in | Wt 213.0 lb

## 2018-07-28 DIAGNOSIS — E785 Hyperlipidemia, unspecified: Secondary | ICD-10-CM | POA: Diagnosis not present

## 2018-07-28 DIAGNOSIS — I10 Essential (primary) hypertension: Secondary | ICD-10-CM

## 2018-07-28 DIAGNOSIS — I350 Nonrheumatic aortic (valve) stenosis: Secondary | ICD-10-CM

## 2018-07-28 DIAGNOSIS — R0609 Other forms of dyspnea: Secondary | ICD-10-CM | POA: Diagnosis not present

## 2018-07-28 DIAGNOSIS — I5189 Other ill-defined heart diseases: Secondary | ICD-10-CM

## 2018-07-28 DIAGNOSIS — R0600 Dyspnea, unspecified: Secondary | ICD-10-CM

## 2018-07-28 DIAGNOSIS — I25119 Atherosclerotic heart disease of native coronary artery with unspecified angina pectoris: Secondary | ICD-10-CM | POA: Diagnosis not present

## 2018-07-28 DIAGNOSIS — M940 Chondrocostal junction syndrome [Tietze]: Secondary | ICD-10-CM | POA: Diagnosis not present

## 2018-07-28 MED ORDER — METOPROLOL SUCCINATE ER 50 MG PO TB24: 50.0000 mg | ORAL_TABLET | Freq: Every day | ORAL | 3 refills | Status: AC

## 2018-07-28 MED ORDER — PREDNISONE 10 MG PO TABS: ORAL_TABLET | ORAL | 3 refills | Status: DC
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
## 2018-07-28 NOTE — Patient Instructions (Signed)
Medication Instructions :   see instruction on prednisone 10 mg bottle   increase METOPROLOL SUCCINATE 50 MG ONE TABLET DAILY  If you need a refill on your cardiac medications before your next appointment, please call your pharmacy.   Lab work: Not needed If you have labs (blood work) drawn today and your tests are completely normal, you will receive your results only by: Marland Kitchen MyChart Message (if you have MyChart) OR . A paper copy in the mail If you have any lab test that is abnormal or we need to change your treatment, we will call you to review the results.  Testing/Procedures: Not needed  Follow-Up: At [facility], you and your health needs are our priority.  As part of our continuing mission to provide you with exceptional heart care, we have created designated Provider Care Teams.  These Care Teams include your primary Cardiologist (physician) and Advanced Practice Providers (APPs -  Physician Assistants and Nurse Practitioners) who all work together to provide you with the care you need, when you need it. You will need a follow up appointment in 6 months.  Please call our office 2 months in advance to schedule this appointment.  You may see Glenetta Hew, MD   or one of the following Advanced Practice Providers on your designated Care Team:   Rosaria Ferries, PA-C . Jory Sims, DNP, ANP . Your physician recommends that you schedule a follow-up appointment in [facility] next available-- cholesterol level .   Any Other Special Instructions Will Be Listed Below (If Applicable).

## 2018-07-31 ENCOUNTER — Encounter: Payer: Self-pay | Admitting: Cardiology

## 2018-07-31 NOTE — Assessment & Plan Note (Signed)
She does have chest wall pain that is reproducible on exam.  It is not a chronic thing, but I think she can probably take intermittent dose of Tylenol or ibuprofen.  Would not want to do high dose meds.

## 2018-07-31 NOTE — Assessment & Plan Note (Signed)
Mild to moderate aortic stenosis.  Follow-up echo in 2021.

## 2018-07-31 NOTE — Assessment & Plan Note (Signed)
Anginal equivalent was "molten lava "sensation in her chest.  Has not had any of these symptoms. Has had 2 Myoview test that were nonischemic.  Most recently here. On low-dose aspirin along with Toprol and amlodipine.  Plan: Increase Toprol.  May need additional blood pressure control. Need to figure out something for cholesterol.  Refuses to take statins

## 2018-07-31 NOTE — Assessment & Plan Note (Signed)
Probably multifactorial but most likely related to deconditioning.  There is clearly probably some diastolic dysfunction component and some pulmonary component but most likely it is just obesity and deconditioning.

## 2018-07-31 NOTE — Assessment & Plan Note (Addendum)
Labs are just very poorly controlled.  She is not interested in rosuvastatin or Livalo.  Had indicated that she would not want to do PCSK9 inhibitors either.  At this point, with her weight, sedentary lifestyle and the fact that her lipids are poorly controlled and there are, I really do not know what else we can do besides warn her that she is at high risk for developing recurrent disease. She had told Tommy Medal, [facility]- CCP that she wanted to get her blood pressure control first before treating her lipids.  Unfortunately, she never followed up with a visit either.  Hopefully we get her blood pressure down with increasing Toprol and potentially getting an a.m. ARB started.  I will ask Tommy Medal, RPH- CCP to touch base with her again to see if we can convince her to start PCSK9 inhibitor.

## 2018-07-31 NOTE — Assessment & Plan Note (Signed)
Poorly controlled blood pressure today.  Now she says she is stressed and just took Sudafed.  I still think we have room to push her blood pressure medication. Plan: Titrate Toprol to 50 mg daily.  She had an adverse reaction to losartan in the past.  I do think that trying a newer ARB would not be unreasonable.  Unfortunately, she never followed through with the [place].

## 2018-07-31 NOTE — Assessment & Plan Note (Signed)
Probably needs nutrition counseling.  She is not able to exercise because of arthritis pains.  She clearly is not eating correctly.  Low threshold to consider nutrition counseling.

## 2018-08-01 ENCOUNTER — Telehealth: Payer: Self-pay | Admitting: Cardiology

## 2018-08-01 NOTE — Telephone Encounter (Signed)
Pt c/o medication issue:  1. Name of Medication  2. How are you currently taking this medication (dosage and times per day)? Once a day, in the evening  3. Are you having a reaction (difficulty breathing--STAT)?   4. What is your medication issue?she can not stay awake

## 2018-08-05 ENCOUNTER — Ambulatory Visit (INDEPENDENT_AMBULATORY_CARE_PROVIDER_SITE_OTHER): Payer: Medicare Other | Admitting: Pharmacist

## 2018-08-05 VITALS — BP 120/52 | HR 78 | Resp 13 | Ht 61.0 in | Wt 210.0 lb

## 2018-08-05 DIAGNOSIS — E785 Hyperlipidemia, unspecified: Secondary | ICD-10-CM

## 2018-08-05 DIAGNOSIS — I10 Essential (primary) hypertension: Secondary | ICD-10-CM

## 2018-08-05 NOTE — Patient Instructions (Signed)
Return for a  follow up appointment as needed  Check your blood pressure at home daily (if able) and keep record of the readings.  Take your BP meds as follows: *NO medication change*  Bring all of your meds, your BP cuff and your record of home blood pressures to your next appointment.  Exercise as you're able, try to walk approximately 30 minutes per day.  Keep salt intake to a minimum, especially watch canned and prepared boxed foods.  Eat more fresh fruits and vegetables and fewer canned items.  Avoid eating in fast food restaurants.    HOW TO TAKE YOUR BLOOD PRESSURE: . Rest 5 minutes before taking your blood pressure. .  Don't smoke or drink caffeinated beverages for at least 30 minutes before. . Take your blood pressure before (not after) you eat. . Sit comfortably with your back supported and both feet on the floor (don't cross your legs). . Elevate your arm to heart level on a table or a desk. . Use the proper sized cuff. It should fit smoothly and snugly around your bare upper arm. There should be enough room to slip a fingertip under the cuff. The bottom edge of the cuff should be 1 inch above the crease of the elbow. . Ideally, take 3 measurements at one sitting and record the average.    [facility] (pharmacist) Dani Danis/Kristin  *Repeat lipids blood work today* *May try CHOLESTOFF PLUS 2 capsules twice daily* *Consider starting zetia (ezetimibe) 10 mg daily if LDL remains above 100mg /dL) *Continue working on positive lifestyle modification*   Cholesterol  Cholesterol is a fat. Your body needs a small amount of cholesterol. Cholesterol (plaque) may build up in your blood vessels (arteries). That makes you more likely to have a heart attack or stroke. You cannot feel your cholesterol level. Having a blood test is the only way to find out if your level is high. Keep your test results. Work with your doctor to keep your cholesterol at a good level. What do the results  mean?  Total cholesterol is how much cholesterol is in your blood.  LDL is bad cholesterol. This is the type that can build up. Try to have low LDL.  HDL is good cholesterol. It cleans your blood vessels and carries LDL away. Try to have high HDL.  Triglycerides are fat that the body can store or burn for energy. What are good levels of cholesterol?  Total cholesterol below 200.  LDL below 100 is good for people who have health risks. LDL below 70 is good for people who have very high risks.  HDL above 40 is good. It is best to have HDL of 60 or higher.  Triglycerides below 150. How can I lower my cholesterol? Diet Follow your diet program as told by your doctor.  Choose fish, white meat chicken, or [place] that is roasted or baked. Try not to eat red meat, fried foods, sausage, or lunch meats.  Eat lots of fresh fruits and vegetables.  Choose whole grains, beans, pasta, potatoes, and cereals.  Choose olive oil, corn oil, or canola oil. Only use small amounts.  Try not to eat butter, mayonnaise, shortening, or palm kernel oils.  Try not to eat foods with trans fats.  Choose low-fat or nonfat dairy foods. ? Drink skim or nonfat milk. ? Eat low-fat or nonfat yogurt and cheeses. ? Try not to drink whole milk or cream. ? Try not to eat ice cream, egg yolks, or full-fat cheeses.  Healthy desserts include angel food cake, ginger snaps, animal crackers, hard candy, popsicles, and low-fat or nonfat frozen yogurt. Try not to eat pastries, cakes, pies, and cookies.  Exercise Follow your exercise program as told by your doctor.  Be more active. Try gardening, walking, and taking the stairs.  Ask your doctor about ways that you can be more active. Medicine  Take over-the-counter and prescription medicines only as told by your doctor. This information is not intended to replace advice given to you by your health care provider. Make sure you discuss any questions you have with  your health care provider. Document Released: 08/24/2008 Document Revised: 12/28/2015 Document Reviewed: 12/08/2015 Elsevier Interactive Patient Education  2019 [employer].

## 2018-08-05 NOTE — Progress Notes (Signed)
Patient ID: Cindy Reyes                 DOB: 03-10-1946                    MRN: [id number]     HPI: Cindy Reyes is a 73 y.o. female patient referred to lipid clinic by Dr. Ellyn Hack. PMH is significant for hyperlipidemia,  CAD s/p CABG x 5, parathyroid tumor  2017, low extremity edema, exertional dyspnea, depression, anxiety, migraines, and vitamin D deficiency. Patient blood pressure was elevated during most recent OV with Dr Ellyn Hack. Compliance is a problems for this patient as well but she denies problems with current therapy. She increased metoprolol as instructed by Dr Ellyn Hack, is taking amlodipine 18m every morning, and denies dizziness, shortness of breath, swelling, head aches ,or chest pain.  Ms BWesterlundwas seen by our lipid clinc 4 months ago and refused PCSK9i, low dose statins, or any other medication for lipid management.  Current Lipid therapy: none  Current HTN therapy: Amlodipine 168mdaily Metoprolol succinate 5075maily  Intolerances:  Rosuvastatin 28m44mily  LDL goal: <70 mg/dL  Diet: "keto diet" - significantly modified, eats mainly fish and vegetables  Exercise: tai chi twice weekly, counting steps   Family History: Father died at 43 f70m heart disease (unsure if MI or CHF, was in the 19602'sother lived into 80's28'sypertension; Brother had heart surgery, but otherwise doing well; 2 children, no problems yet  Social History: No tobacco; no alcohol; rare caffeine  Labs: 11/06/2017: CHO 253; TG 110; HDL 51; LDL-c 180  Past Medical History:  Diagnosis Date  . Allergy   . Anemia   . Aortic stenosis, mild 12/2015   Mild AS & mod MR by Echo  . Asthma   . Bipolar disorder without psychotic features (HCC)[place]. Blood transfusion without reported diagnosis   . CAD, multiple vessel 2006   [place]  Myoview November 2017 nonischemic with breast attenuation.  Normal EF.  . Colon polyps   .  Depression   . GAD (generalized anxiety disorder)   . History of nephrolithiasis   . Hyperlipidemia    Not currently on medications.  . Hyperparathyroidism (HCC)[place]17   Parathyroid tumor  . Hypertension   . Migraine   . Osteopenia   . 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 Thyroid disease    Not currently on replacement therapy.  . Vitamin D deficiency     Current Outpatient Medications on File Prior to Visit  Medication Sig Dispense Refill  . amLODipine (NORVASC) 10 MG tablet Take 1 tablet (10 mg total) by mouth at bedtime. 30 tablet 6  . aspirin EC 81 MG tablet Take 81 mg by mouth daily.    . famotidine (PEPCID) 20 MG tablet One at bedtime 30 tablet 11  . metoprolol succinate (TOPROL-XL) 50 MG 24 hr tablet Take 1 tablet (50 mg total) by mouth daily. 90 tablet 3  . OVER THE COUNTER MEDICATION Take 1 tablet by mouth daily as needed (migraine). OTC migraine medication with caffeine    . Cholecalciferol (VITAMIN D3) 1.25 MG (50000 UT) CAPS TAKE ONE CAPSULE BY MOUTH EVERY WEEK FOR 8 WEEKS (Patient not taking: Reported on 08/05/2018) 12 capsule 0   No current facility-administered medications on file prior to visit.     Allergies  Allergen Reactions  . Atorvastatin Other (See Comments)  HBP, Headache  . Coconut Fatty Acids   . Lidocaine Other (See Comments)    Hallucination  . Statins     Head ache   . Latex Itching  . Losartan Other (See Comments)    HBP, Headache  . Penicillins Hives and Itching    Reaction [place] Has patient had a PCN reaction causing immediate rash, facial/tongue/throat swelling, SOB or lightheadedness with hypotension: Yes Has patient had a PCN reaction causing severe rash involving mucus membranes or skin necrosis: No Has patient had a PCN reaction that required hospitalization: No Has patient had a PCN reaction occurring within the last 10 years: No If all of the above answers are "NO", then may proceed with  Cephalosporin use.     Essential hypertension, on Norvasc and Toprol Blood pressure well controlled during office visit. Patient denies problems with current therapy and reports compliance. Will continue current therapy without changes.   Hyperlipidemia, previously on Crestor but intolerant LDL remains extremely above desired goal but patient remains reluctant to add injectable medication or to try low dose statin. She agreed on adding plat sterols to get daily regimen and repeat Lipid panel today.  She will agree to add ezetimibe 25m daily as well if LDL remains above 100.   Niles Ess Rodriguez-Guzman PharmD, BCPS, [id number]/28/2020 5:37 PM

## 2018-08-06 LAB — LIPID PANEL
CHOL/HDL RATIO: 6.1 ratio — AB (ref 0.0–4.4)
Cholesterol, Total: 285 mg/dL — ABNORMAL HIGH (ref 100–199)
HDL: 47 mg/dL (ref >39)
LDL Calculated: 201 mg/dL — ABNORMAL HIGH (ref 0–99)
Triglycerides: 187 mg/dL — ABNORMAL HIGH (ref 0–149)
VLDL Cholesterol Cal: 37 mg/dL (ref 5–40)

## 2018-08-08 ENCOUNTER — Encounter: Payer: Self-pay | Admitting: Pharmacist

## 2018-08-08 NOTE — Assessment & Plan Note (Signed)
LDL remains extremely above desired goal but patient remains reluctant to add injectable medication or to try low dose statin. She agreed on adding plat sterols to get daily regimen and repeat Lipid panel today.  She will agree to add ezetimibe 10mg  daily as well if LDL remains above 100.

## 2018-08-08 NOTE — Assessment & Plan Note (Signed)
Blood pressure well controlled during office visit. Patient denies problems with current therapy and reports compliance. Will continue current therapy without changes.

## 2018-09-16 ENCOUNTER — Other Ambulatory Visit: Payer: Self-pay | Admitting: Cardiology

## 2018-09-16 NOTE — Telephone Encounter (Signed)
Amlodipine refilled.

## 2018-09-18 ENCOUNTER — Ambulatory Visit: Payer: Medicare Other | Admitting: Psychology

## 2018-10-02 ENCOUNTER — Ambulatory Visit: Payer: Medicare Other | Admitting: Psychology

## 2018-10-16 ENCOUNTER — Ambulatory Visit: Payer: Medicare Other | Admitting: Psychology

## 2018-10-30 ENCOUNTER — Ambulatory Visit: Payer: Medicare Other | Admitting: Psychology

## 2018-11-13 ENCOUNTER — Ambulatory Visit: Payer: Medicare Other | Admitting: Psychology

## 2018-11-27 ENCOUNTER — Other Ambulatory Visit: Payer: Self-pay

## 2018-11-27 ENCOUNTER — Telehealth: Payer: Self-pay | Admitting: Family Medicine

## 2018-11-27 DIAGNOSIS — E559 Vitamin D deficiency, unspecified: Secondary | ICD-10-CM

## 2018-11-27 NOTE — Telephone Encounter (Signed)
Okay for CBC, CMP, PTH, vit D.

## 2018-11-27 NOTE — Telephone Encounter (Signed)
Patient called and would like to know of Dr. Juleen China can can put ina order for labs for calcium, PHC and vitamin d. And then discuss at her visit. Please call patient back with any questions.

## 2018-11-27 NOTE — Telephone Encounter (Signed)
Called patient not a working number I have placed orders and will try to call again

## 2018-11-27 NOTE — Telephone Encounter (Signed)
Patient has follow up with you on 6/24 ok to order labs?

## 2018-12-01 NOTE — Telephone Encounter (Signed)
Lab orders in Epic. Pt has an appt 6/24 with Dr. Juleen China.

## 2018-12-03 ENCOUNTER — Ambulatory Visit: Payer: Medicare Other | Admitting: Family Medicine

## 2018-12-05 DIAGNOSIS — E559 Vitamin D deficiency, unspecified: Secondary | ICD-10-CM | POA: Diagnosis not present

## 2018-12-05 DIAGNOSIS — I130 Hypertensive heart and chronic kidney disease with heart failure and stage 1 through stage 4 chronic kidney disease, or unspecified chronic kidney disease: Secondary | ICD-10-CM | POA: Diagnosis not present

## 2018-12-05 DIAGNOSIS — G43909 Migraine, unspecified, not intractable, without status migrainosus: Secondary | ICD-10-CM | POA: Diagnosis not present

## 2018-12-16 ENCOUNTER — Telehealth: Payer: Self-pay | Admitting: Pharmacist

## 2018-12-16 NOTE — Telephone Encounter (Signed)
Patient originally reluctant to use injectable medication.  LMOM: okay to start PA to [place]? Willing to try low dose statin?

## 2018-12-16 NOTE — Telephone Encounter (Signed)
-----   Message from [place], [facility] sent at 10/13/2018  8:10 AM EDT ----- Regarding: Lipids Using ezetimibe? Okay to initiate [place]?

## 2018-12-17 ENCOUNTER — Ambulatory Visit: Payer: Medicare Other | Admitting: Family Medicine

## 2018-12-17 NOTE — Progress Notes (Deleted)
Aliyana Houston Surges is a 73 y.o. female is here for follow up.  History of Present Illness:   (SCRIBE ATTESTATION)  HPI:   Health Maintenance Due  Topic Date Due  . COLONOSCOPY  03/21/1997  . MAMMOGRAM  11/29/2018   Depression screen [facility] 2/9 11/19/2017 07/11/2017 11/16/2016  Decreased Interest 0 1 0  Down, Depressed, Hopeless 0 1 0  PHQ - 2 Score 0 2 0  Altered sleeping - 2 -  Tired, decreased energy - 2 -  Change in appetite - 0 -  Feeling bad or failure about yourself  - 1 -  Trouble concentrating - 1 -  Moving slowly or fidgety/restless - 0 -  Suicidal thoughts - 0 -  PHQ-9 Score - 8 -   PMHx, SurgHx, SocialHx, FamHx, Medications, and Allergies were reviewed in the Visit Navigator and updated as appropriate.   Patient Active Problem List   Diagnosis Date Noted  . Costochondritis 07/28/2018  . 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 Morbid obesity due to excess calories ([place]) 03/25/2018  . Osteopenia, due for DEXA 11/2018 03/04/2018  . DOE (dyspnea on exertion) 02/20/2018  . Arthralgia of multiple joints, worst in hips and shoulders, controlled with Tai Chi 08/17/2017  . Diastolic dysfunction, EF 90% 08/17/2017  . Status post subtotal parathyroidectomy 11/16/2016  . Essential hypertension, on Norvasc and Toprol 11/16/2016  . Hyperlipidemia, previously on Crestor but intolerant 11/16/2016  . History of nephrolithiasis 01/27/2016  . Nonrheumatic aortic valve insufficiency 01/27/2016  . Vitamin D deficiency 01/27/2016  . History of hyperparathyroidism, s/p removal of one parathyroid 01/25/2016  . Mild cognitive impairment 01/09/2016  . 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 Atherosclerotic heart disease of native coronary artery with angina pectoris ([place]) 01/05/2016  . Bipolar disorder, currently in remission ([place]) 01/05/2016  . Hypercalcemia 01/05/2016  . Nonrheumatic aortic valve stenosis 01/05/2016  . Hx of CABG  06/11/2004   Social History   Tobacco Use  . Smoking status: Never Smoker  . Smokeless tobacco: Never Used  Substance Use Topics  . Alcohol use: No  . Drug use: No   Current Medications and Allergies   Current Outpatient Medications:  .  amLODipine (NORVASC) 10 MG tablet, TAKE 1 TABLET BY MOUTH EVERYDAY AT BEDTIME, Disp: 30 tablet, Rfl: 5 .  aspirin EC 81 MG tablet, Take 81 mg by mouth daily., Disp: , Rfl:  .  Cholecalciferol (VITAMIN D3) 1.25 MG (50000 UT) CAPS, TAKE ONE CAPSULE BY MOUTH EVERY WEEK FOR 8 WEEKS (Patient not taking: Reported on 08/05/2018), Disp: 12 capsule, Rfl: 0 .  famotidine (PEPCID) 20 MG tablet, One at bedtime, Disp: 30 tablet, Rfl: 11 .  metoprolol succinate (TOPROL-XL) 50 MG 24 hr tablet, Take 1 tablet (50 mg total) by mouth daily., Disp: 90 tablet, Rfl: 3 .  OVER THE COUNTER MEDICATION, Take 1 tablet by mouth daily as needed (migraine). OTC migraine medication with caffeine, Disp: , Rfl:   Allergies  Allergen Reactions  . Atorvastatin Other (See Comments)    HBP, Headache  . Coconut Fatty Acids   . Lidocaine Other (See Comments)    Hallucination  . Statins     Head ache   . Latex Itching  . Losartan Other (See Comments)    HBP, Headache  . Penicillins Hives and Itching    Reaction [place] Has patient had a PCN reaction causing immediate rash, facial/tongue/throat swelling, SOB or lightheadedness with hypotension: Yes Has patient  had a PCN reaction causing severe rash involving mucus membranes or skin necrosis: No Has patient had a PCN reaction that required hospitalization: No Has patient had a PCN reaction occurring within the last 10 years: No If all of the above answers are "NO", then may proceed with Cephalosporin use.    Review of Systems   Pertinent items are noted in the HPI. Otherwise, a complete ROS is negative.  Vitals  There were no vitals filed for this visit.   There is no height or weight on file to calculate  BMI.  Physical Exam   Physical Exam  Results for orders placed or performed in visit on 08/05/18  Lipid Profile  Result Value Ref Range   Cholesterol, Total 285 (H) 100 - 199 mg/dL   Triglycerides 187 (H) 0 - 149 mg/dL   HDL 47 >39 mg/dL   VLDL Cholesterol Cal 37 5 - 40 mg/dL   LDL Calculated 201 (H) 0 - 99 mg/dL   Chol/HDL Ratio 6.1 (H) 0.0 - 4.4 ratio    Assessment and Plan   There are no diagnoses linked to this encounter.  . Orders and follow up as documented in [place], reviewed diet, exercise and weight control, cardiovascular risk and specific lipid/LDL goals reviewed, reviewed medications and side effects in detail.  . Reviewed expectations re: course of current medical issues. . Outlined signs and symptoms indicating need for more acute intervention. . Patient verbalized understanding and all questions were answered. . Patient received an After Visit Summary.  *** CMA served as Education administrator during this visit. History, Physical, and Plan performed by medical provider. The above documentation has been reviewed and is accurate and complete. Briscoe Deutscher, D.O.  Briscoe Deutscher, DO [place], [facility] 12/17/2018

## 2018-12-20 IMAGING — DX DG CHEST 2V
2 series · 2 of 2 positions shown · non-contrast
Comparison: Chest x-ray 11/16/2016.

CLINICAL DATA: Shortness of breath.  Cough.

EXAM:
CHEST - 2 VIEW

[chest pa]
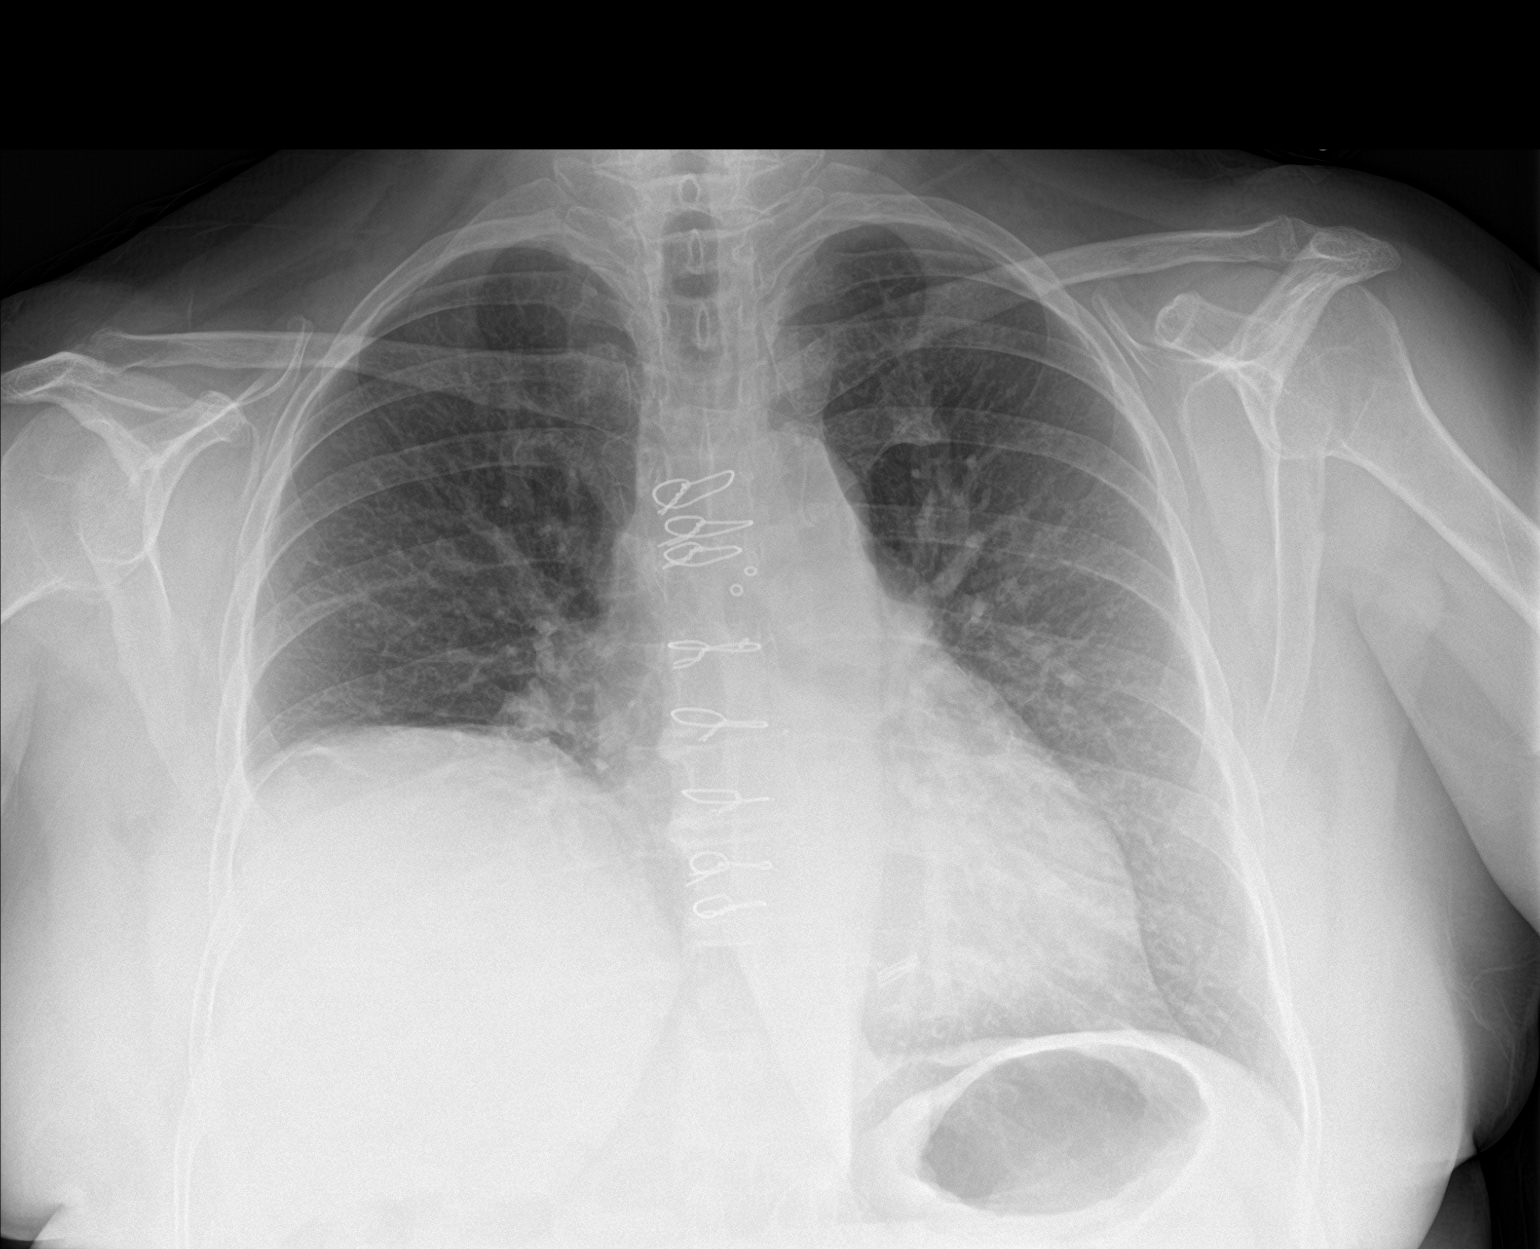

[chest lat]
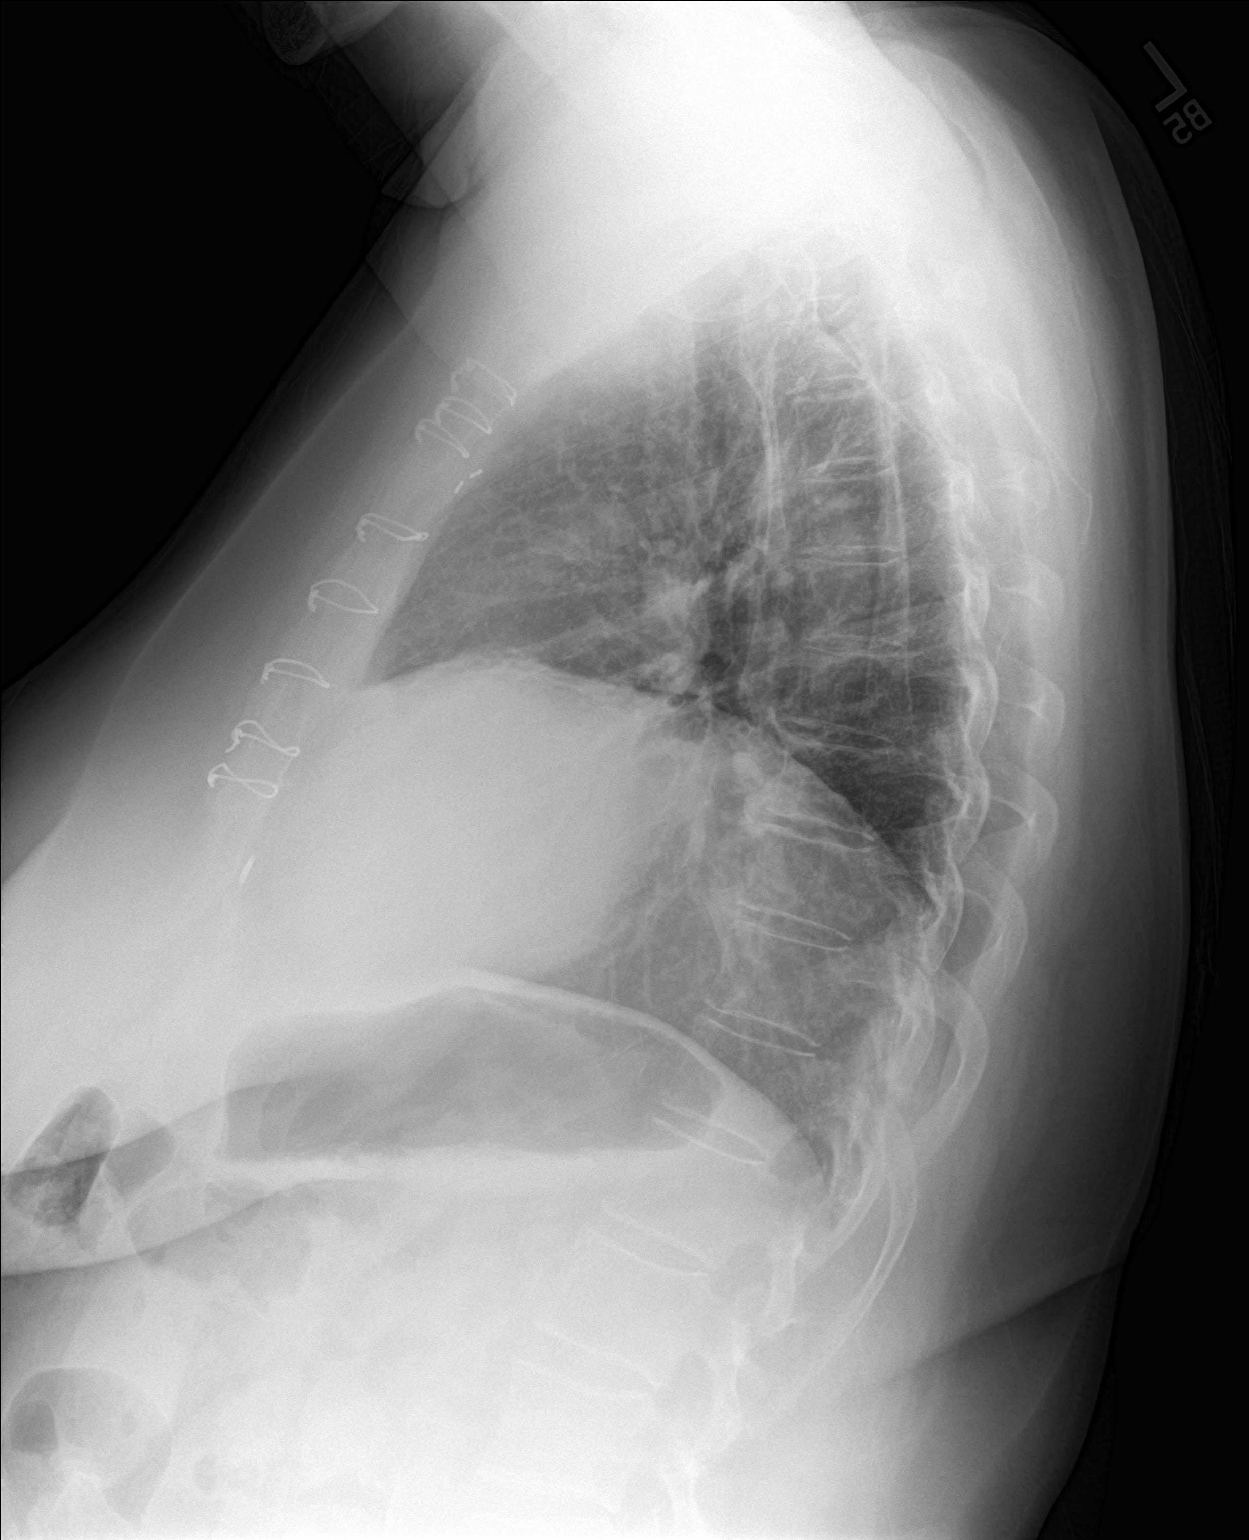

[2 of 2 positions shown; findings below may reference images not displayed]

FINDINGS: Prior CABG. Cardiomegaly with normal pulmonary vascularity. Stable
mild right base subsegmental atelectasis and or scarring. Stable
elevation right hemidiaphragm. No pleural effusion or pneumothorax.
Degenerative change thoracic spine
IMPRESSION: 1.  Prior CABG.  Cardiomegaly normal pulmonary vascularity.

2. Mild right base subsegmental atelectasis and or scarring. Stable
elevation right hemidiaphragm.

## 2018-12-24 ENCOUNTER — Ambulatory Visit: Payer: Medicare Other | Admitting: Family Medicine

## 2018-12-24 DIAGNOSIS — Z0289 Encounter for other administrative examinations: Secondary | ICD-10-CM
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
## 2019-01-09 ENCOUNTER — Inpatient Hospital Stay (HOSPITAL_COMMUNITY)
Admission: EM | Admit: 2019-01-09 | Discharge: 2019-01-11 | DRG: 690 | Disposition: A | Discharge status: Home / Self Care | Payer: Medicare Other | Attending: Internal Medicine | Admitting: Internal Medicine

## 2019-01-09 ENCOUNTER — Emergency Department (HOSPITAL_COMMUNITY): Payer: Medicare Other

## 2019-01-09 ENCOUNTER — Encounter (HOSPITAL_COMMUNITY): Payer: Self-pay

## 2019-01-09 ENCOUNTER — Other Ambulatory Visit: Payer: Self-pay

## 2019-01-09 DIAGNOSIS — E785 Hyperlipidemia, unspecified: Secondary | ICD-10-CM | POA: Diagnosis present

## 2019-01-09 DIAGNOSIS — Z7982 Long term (current) use of aspirin: Secondary | ICD-10-CM

## 2019-01-09 DIAGNOSIS — Z20828 Contact with and (suspected) exposure to other viral communicable diseases: Secondary | ICD-10-CM | POA: Diagnosis present

## 2019-01-09 DIAGNOSIS — K219 Gastro-esophageal reflux disease without esophagitis: Secondary | ICD-10-CM | POA: Diagnosis present

## 2019-01-09 DIAGNOSIS — Z91018 Allergy to other foods: Secondary | ICD-10-CM

## 2019-01-09 DIAGNOSIS — Z951 Presence of aortocoronary bypass graft: Secondary | ICD-10-CM | POA: Diagnosis not present

## 2019-01-09 DIAGNOSIS — R1084 Generalized abdominal pain: Secondary | ICD-10-CM | POA: Diagnosis not present

## 2019-01-09 DIAGNOSIS — Z803 Family history of malignant neoplasm of breast: Secondary | ICD-10-CM

## 2019-01-09 DIAGNOSIS — Z888 Allergy status to other drugs, medicaments and biological substances status: Secondary | ICD-10-CM | POA: Diagnosis not present

## 2019-01-09 DIAGNOSIS — I2510 Atherosclerotic heart disease of native coronary artery without angina pectoris: Secondary | ICD-10-CM | POA: Diagnosis present

## 2019-01-09 DIAGNOSIS — Z8249 Family history of ischemic heart disease and other diseases of the circulatory system: Secondary | ICD-10-CM

## 2019-01-09 DIAGNOSIS — Z880 Allergy status to penicillin: Secondary | ICD-10-CM | POA: Diagnosis not present

## 2019-01-09 DIAGNOSIS — N136 Pyonephrosis: Principal | ICD-10-CM | POA: Diagnosis present

## 2019-01-09 DIAGNOSIS — R918 Other nonspecific abnormal finding of lung field: Secondary | ICD-10-CM | POA: Diagnosis not present

## 2019-01-09 DIAGNOSIS — F3170 Bipolar disorder, currently in remission, most recent episode unspecified: Secondary | ICD-10-CM | POA: Diagnosis present

## 2019-01-09 DIAGNOSIS — I10 Essential (primary) hypertension: Secondary | ICD-10-CM | POA: Diagnosis not present

## 2019-01-09 DIAGNOSIS — E213 Hyperparathyroidism, unspecified: Secondary | ICD-10-CM | POA: Diagnosis present

## 2019-01-09 DIAGNOSIS — N390 Urinary tract infection, site not specified: Secondary | ICD-10-CM | POA: Diagnosis present

## 2019-01-09 DIAGNOSIS — K8020 Calculus of gallbladder without cholecystitis without obstruction: Secondary | ICD-10-CM | POA: Diagnosis not present

## 2019-01-09 DIAGNOSIS — Z87442 Personal history of urinary calculi: Secondary | ICD-10-CM | POA: Diagnosis not present

## 2019-01-09 DIAGNOSIS — N202 Calculus of kidney with calculus of ureter: Secondary | ICD-10-CM | POA: Diagnosis not present

## 2019-01-09 DIAGNOSIS — R7401 Elevation of levels of liver transaminase levels: Secondary | ICD-10-CM

## 2019-01-09 DIAGNOSIS — I352 Nonrheumatic aortic (valve) stenosis with insufficiency: Secondary | ICD-10-CM | POA: Diagnosis present

## 2019-01-09 DIAGNOSIS — N171 Acute kidney failure with acute cortical necrosis: Secondary | ICD-10-CM

## 2019-01-09 DIAGNOSIS — I491 Atrial premature depolarization: Secondary | ICD-10-CM | POA: Diagnosis not present

## 2019-01-09 DIAGNOSIS — J45909 Unspecified asthma, uncomplicated: Secondary | ICD-10-CM | POA: Diagnosis not present

## 2019-01-09 DIAGNOSIS — R740 Nonspecific elevation of levels of transaminase and lactic acid dehydrogenase [LDH]: Secondary | ICD-10-CM | POA: Diagnosis not present

## 2019-01-09 DIAGNOSIS — Z79899 Other long term (current) drug therapy: Secondary | ICD-10-CM

## 2019-01-09 DIAGNOSIS — I5189 Other ill-defined heart diseases: Secondary | ICD-10-CM

## 2019-01-09 DIAGNOSIS — K760 Fatty (change of) liver, not elsewhere classified: Secondary | ICD-10-CM | POA: Diagnosis present

## 2019-01-09 DIAGNOSIS — E892 Postprocedural hypoparathyroidism: Secondary | ICD-10-CM

## 2019-01-09 DIAGNOSIS — E039 Hypothyroidism, unspecified: Secondary | ICD-10-CM | POA: Diagnosis not present

## 2019-01-09 DIAGNOSIS — Z9889 Other specified postprocedural states: Secondary | ICD-10-CM

## 2019-01-09 DIAGNOSIS — R0902 Hypoxemia: Secondary | ICD-10-CM | POA: Diagnosis not present

## 2019-01-09 DIAGNOSIS — K8050 Calculus of bile duct without cholangitis or cholecystitis without obstruction: Secondary | ICD-10-CM | POA: Diagnosis not present

## 2019-01-09 DIAGNOSIS — Z91040 Latex allergy status: Secondary | ICD-10-CM

## 2019-01-09 DIAGNOSIS — Z808 Family history of malignant neoplasm of other organs or systems: Secondary | ICD-10-CM

## 2019-01-09 DIAGNOSIS — Z8261 Family history of arthritis: Secondary | ICD-10-CM

## 2019-01-09 DIAGNOSIS — N200 Calculus of kidney: Secondary | ICD-10-CM | POA: Diagnosis not present

## 2019-01-09 DIAGNOSIS — N261 Atrophy of kidney (terminal): Secondary | ICD-10-CM | POA: Diagnosis not present

## 2019-01-09 DIAGNOSIS — R109 Unspecified abdominal pain: Secondary | ICD-10-CM

## 2019-01-09 DIAGNOSIS — R079 Chest pain, unspecified: Secondary | ICD-10-CM | POA: Diagnosis not present

## 2019-01-09 DIAGNOSIS — N132 Hydronephrosis with renal and ureteral calculous obstruction: Secondary | ICD-10-CM | POA: Diagnosis not present

## 2019-01-09 DIAGNOSIS — M349 Systemic sclerosis, unspecified: Secondary | ICD-10-CM | POA: Diagnosis present

## 2019-01-09 DIAGNOSIS — Z818 Family history of other mental and behavioral disorders: Secondary | ICD-10-CM

## 2019-01-09 DIAGNOSIS — R935 Abnormal findings on diagnostic imaging of other abdominal regions, including retroperitoneum: Secondary | ICD-10-CM | POA: Insufficient documentation

## 2019-01-09 DIAGNOSIS — IMO0001 Reserved for inherently not codable concepts without codable children: Secondary | ICD-10-CM

## 2019-01-09 DIAGNOSIS — R911 Solitary pulmonary nodule: Secondary | ICD-10-CM | POA: Insufficient documentation

## 2019-01-09 DIAGNOSIS — R748 Abnormal levels of other serum enzymes: Secondary | ICD-10-CM

## 2019-01-09 HISTORY — DX: Abnormal findings on diagnostic imaging of other abdominal regions, including retroperitoneum: R93.5

## 2019-01-09 HISTORY — DX: Calculus of gallbladder without cholecystitis without obstruction: K80.20

## 2019-01-09 HISTORY — DX: Reserved for inherently not codable concepts without codable children: IMO0001

## 2019-01-09 HISTORY — DX: Hydronephrosis with renal and ureteral calculous obstruction: N13.2

## 2019-01-09 HISTORY — DX: Calculus of kidney: N20.0

## 2019-01-09 HISTORY — DX: Abnormal levels of other serum enzymes: R74.8

## 2019-01-09 HISTORY — DX: Solitary pulmonary nodule: R91.1

## 2019-01-09 LAB — CBC WITH DIFFERENTIAL/PLATELET
Abs Immature Granulocytes: 0.02 10*3/uL (ref 0.00–0.07)
Basophils Absolute: 0.1 10*3/uL (ref 0.0–0.1)
Basophils Relative: 1 %
Eosinophils Absolute: 0.2 10*3/uL (ref 0.0–0.5)
Eosinophils Relative: 2 %
HCT: 46.9 % — ABNORMAL HIGH (ref 36.0–46.0)
Hemoglobin: 15.4 g/dL — ABNORMAL HIGH (ref 12.0–15.0)
Immature Granulocytes: 0 %
Lymphocytes Relative: 17 %
Lymphs Abs: 1.3 10*3/uL (ref 0.7–4.0)
MCH: 31 pg (ref 26.0–34.0)
MCHC: 32.8 g/dL (ref 30.0–36.0)
MCV: 94.6 fL (ref 80.0–100.0)
Monocytes Absolute: 0.7 10*3/uL (ref 0.1–1.0)
Monocytes Relative: 9 %
Neutro Abs: 5.4 10*3/uL (ref 1.7–7.7)
Neutrophils Relative %: 71 %
Platelets: 174 10*3/uL (ref 150–400)
RBC: 4.96 MIL/uL (ref 3.87–5.11)
RDW: 13.2 % (ref 11.5–15.5)
WBC: 7.6 10*3/uL (ref 4.0–10.5)
nRBC: 0 % (ref 0.0–0.2)

## 2019-01-09 LAB — COMPREHENSIVE METABOLIC PANEL
ALT: 219 U/L — ABNORMAL HIGH (ref 0–44)
AST: 390 U/L — ABNORMAL HIGH (ref 15–41)
Albumin: 3.9 g/dL (ref 3.5–5.0)
Alkaline Phosphatase: 92 U/L (ref 38–126)
Anion gap: 10 (ref 5–15)
BUN: 18 mg/dL (ref 8–23)
CO2: 25 mmol/L (ref 22–32)
Calcium: 9.1 mg/dL (ref 8.9–10.3)
Chloride: 105 mmol/L (ref 98–111)
Creatinine, Ser: 1.14 mg/dL — ABNORMAL HIGH (ref 0.44–1.00)
GFR calc Af Amer: 56 mL/min — ABNORMAL LOW (ref >60)
GFR calc non Af Amer: 48 mL/min — ABNORMAL LOW (ref >60)
Glucose, Bld: 107 mg/dL — ABNORMAL HIGH (ref 70–99)
Potassium: 3.4 mmol/L — ABNORMAL LOW (ref 3.5–5.1)
Sodium: 140 mmol/L (ref 135–145)
Total Bilirubin: 1.6 mg/dL — ABNORMAL HIGH (ref 0.3–1.2)
Total Protein: 6.8 g/dL (ref 6.5–8.1)

## 2019-01-09 LAB — URINALYSIS, ROUTINE W REFLEX MICROSCOPIC
Bilirubin Urine: NEGATIVE
Glucose, UA: NEGATIVE mg/dL
Ketones, ur: 5 mg/dL — AB
Nitrite: NEGATIVE
Protein, ur: NEGATIVE mg/dL
Specific Gravity, Urine: 1.011 (ref 1.005–1.030)
WBC, UA: >50 WBC/hpf — ABNORMAL HIGH (ref 0–5)
pH: 6 (ref 5.0–8.0)

## 2019-01-09 LAB — TROPONIN I (HIGH SENSITIVITY)
Troponin I (High Sensitivity): 9 ng/L (ref <18)
Troponin I (High Sensitivity): 10 ng/L (ref <18)

## 2019-01-09 LAB — LIPASE, BLOOD: Lipase: 47 U/L (ref 11–51)

## 2019-01-09 MED ORDER — SODIUM CHLORIDE 0.9 % IV BOLUS
1000.0000 mL | Freq: Once | INTRAVENOUS | Status: AC
  Administered 2019-01-10: 1000 mL via INTRAVENOUS

## 2019-01-09 MED ORDER — MORPHINE SULFATE (PF) 4 MG/ML IV SOLN
4.0000 mg | Freq: Once | INTRAVENOUS | Status: DC
  Filled 2019-01-09 (×2): qty 1

## 2019-01-09 MED ORDER — POTASSIUM CHLORIDE CRYS ER 20 MEQ PO TBCR: 40.0000 meq | EXTENDED_RELEASE_TABLET | Freq: Once | ORAL | Status: DC

## 2019-01-09 MED ORDER — SODIUM CHLORIDE 0.9 % IV SOLN
2.0000 g | Freq: Once | INTRAVENOUS | Status: AC
  Administered 2019-01-10: 2 g via INTRAVENOUS
  Filled 2019-01-09: qty 20

## 2019-01-09 MED ORDER — SODIUM CHLORIDE 0.9 % IV BOLUS
1000.0000 mL | Freq: Once | INTRAVENOUS | Status: AC
  Administered 2019-01-09: 1000 mL via INTRAVENOUS
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
## 2019-01-09 NOTE — ED Notes (Signed)
Patient transported to X-ray 

## 2019-01-09 NOTE — ED Notes (Signed)
Pt to US.
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
## 2019-01-09 NOTE — ED Triage Notes (Signed)
Per [facility], pt from [facility] w/ a c/o CP and epigastric pain. The epigastric pain has been ongoing to for 2 days. She was recently diagnosed with GERD. She took Famotidine w/ some relief of pain and nausea. No vomiting. CP was located on the left side of her chest and radiated to the right. She reports that the CP was only present for a couple of seconds and then subsided.   324 ASA 2 nitro 20 ga L AC  150/81 94% RA HR 77 RR 20

## 2019-01-09 NOTE — ED Notes (Signed)
Patient transported to CT 
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
## 2019-01-10 LAB — COMPREHENSIVE METABOLIC PANEL
ALT: 688 U/L — ABNORMAL HIGH (ref 0–44)
AST: 857 U/L — ABNORMAL HIGH (ref 15–41)
Albumin: 3.5 g/dL (ref 3.5–5.0)
Alkaline Phosphatase: 106 U/L (ref 38–126)
Anion gap: 10 (ref 5–15)
BUN: 12 mg/dL (ref 8–23)
CO2: 22 mmol/L (ref 22–32)
Calcium: 8.5 mg/dL — ABNORMAL LOW (ref 8.9–10.3)
Chloride: 110 mmol/L (ref 98–111)
Creatinine, Ser: 0.93 mg/dL (ref 0.44–1.00)
GFR calc Af Amer: >60 mL/min (ref >60)
GFR calc non Af Amer: >60 mL/min (ref >60)
Glucose, Bld: 96 mg/dL (ref 70–99)
Potassium: 3.4 mmol/L — ABNORMAL LOW (ref 3.5–5.1)
Sodium: 142 mmol/L (ref 135–145)
Total Bilirubin: 1.6 mg/dL — ABNORMAL HIGH (ref 0.3–1.2)
Total Protein: 6 g/dL — ABNORMAL LOW (ref 6.5–8.1)

## 2019-01-10 LAB — CBC
HCT: 43.2 % (ref 36.0–46.0)
Hemoglobin: 14.7 g/dL (ref 12.0–15.0)
MCH: 31.2 pg (ref 26.0–34.0)
MCHC: 34 g/dL (ref 30.0–36.0)
MCV: 91.7 fL (ref 80.0–100.0)
Platelets: 153 10*3/uL (ref 150–400)
RBC: 4.71 MIL/uL (ref 3.87–5.11)
RDW: 13.2 % (ref 11.5–15.5)
WBC: 3.8 10*3/uL — ABNORMAL LOW (ref 4.0–10.5)
nRBC: 0 % (ref 0.0–0.2)

## 2019-01-10 LAB — SARS CORONAVIRUS 2 BY RT PCR (HOSPITAL ORDER, PERFORMED IN ~~LOC~~ HOSPITAL LAB): SARS Coronavirus 2: NEGATIVE

## 2019-01-10 MED ORDER — SODIUM CHLORIDE 0.9 % IV SOLN
INTRAVENOUS | Status: DC
  Administered 2019-01-10 – 2019-01-11 (×4): via INTRAVENOUS

## 2019-01-10 MED ORDER — DIPHENHYDRAMINE HCL 50 MG/ML IJ SOLN
25.0000 mg | Freq: Once | INTRAMUSCULAR | Status: AC
  Administered 2019-01-10: 25 mg via INTRAVENOUS
  Filled 2019-01-10: qty 1

## 2019-01-10 MED ORDER — ONDANSETRON HCL 4 MG/2ML IJ SOLN: 4.0000 mg | Freq: Four times a day (QID) | INTRAMUSCULAR | Status: DC | PRN

## 2019-01-10 MED ORDER — HYDRALAZINE HCL 20 MG/ML IJ SOLN: 10.0000 mg | Freq: Four times a day (QID) | INTRAMUSCULAR | Status: DC | PRN

## 2019-01-10 MED ORDER — HEPARIN SODIUM (PORCINE) 5000 UNIT/ML IJ SOLN
5000.0000 [IU] | Freq: Three times a day (TID) | INTRAMUSCULAR | Status: DC
  Administered 2019-01-10 – 2019-01-11 (×3): 5000 [IU] via SUBCUTANEOUS
  Filled 2019-01-10 (×3): qty 1

## 2019-01-10 MED ORDER — PANTOPRAZOLE SODIUM 40 MG PO TBEC: 40.0000 mg | DELAYED_RELEASE_TABLET | Freq: Two times a day (BID) | ORAL | Status: DC | PRN

## 2019-01-10 MED ORDER — METOPROLOL SUCCINATE ER 25 MG PO TB24
50.0000 mg | ORAL_TABLET | Freq: Every day | ORAL | Status: DC
  Administered 2019-01-10 – 2019-01-11 (×2): 50 mg via ORAL
  Filled 2019-01-10 (×2): qty 2

## 2019-01-10 MED ORDER — ASPIRIN-ACETAMINOPHEN-CAFFEINE 250-250-65 MG PO TABS: 1.0000 | ORAL_TABLET | Freq: Four times a day (QID) | ORAL | Status: DC | PRN

## 2019-01-10 MED ORDER — ONDANSETRON HCL 4 MG PO TABS: 4.0000 mg | ORAL_TABLET | Freq: Four times a day (QID) | ORAL | Status: DC | PRN

## 2019-01-10 MED ORDER — SODIUM CHLORIDE 0.9 % IV SOLN
1.0000 g | INTRAVENOUS | Status: DC
  Filled 2019-01-10: qty 10

## 2019-01-10 MED ORDER — KETOROLAC TROMETHAMINE 15 MG/ML IJ SOLN: 15.0000 mg | Freq: Four times a day (QID) | INTRAMUSCULAR | Status: DC | PRN

## 2019-01-10 MED ORDER — AMLODIPINE BESYLATE 10 MG PO TABS
10.0000 mg | ORAL_TABLET | Freq: Every day | ORAL | Status: DC
  Administered 2019-01-10 (×2): 10 mg via ORAL
  Filled 2019-01-10 (×2): qty 1

## 2019-01-10 NOTE — Progress Notes (Signed)
Patient arrived to 6N10 from the ED and she ambulated from the stretcher to the bed. VSS and no complaints at this time. Oriented to room and to call bell. Will continue to monitor.
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
## 2019-01-10 NOTE — Progress Notes (Signed)
Patient reported to this RN that she was developing a rash from the antibiotics she received in the ED. Paged on call MD and received a one-time order for Benadryl IV. Will implement and continue to monitor.
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
## 2019-01-10 NOTE — Plan of Care (Signed)
  Problem: Education: Goal: Knowledge of General Education information will improve Description Including pain rating scale, medication(s)/side effects and non-pharmacologic comfort measures Outcome: Progressing   Problem: Health Behavior/Discharge Planning: Goal: Ability to manage health-related needs will improve Outcome: Progressing   Problem: Clinical Measurements: Goal: Ability to maintain clinical measurements within normal limits will improve Outcome: Progressing Goal: Will remain free from infection Outcome: Progressing   Problem: Activity: Goal: Risk for activity intolerance will decrease Outcome: Progressing   Problem: Coping: Goal: Level of anxiety will decrease Outcome: Progressing   Problem: Elimination: Goal: Will not experience complications related to bowel motility Outcome: Progressing Goal: Will not experience complications related to urinary retention Outcome: Progressing   Problem: Pain Managment: Goal: General experience of comfort will improve Outcome: Progressing   Problem: Safety: Goal: Ability to remain free from injury will improve Outcome: Progressing   Problem: Skin Integrity: Goal: Risk for impaired skin integrity will decrease Outcome: Progressing   

## 2019-01-10 NOTE — Progress Notes (Signed)
PROGRESS NOTE  Nohelani Benning QBH:[id number] DOB: 1946-01-23 DOA: 01/09/2019 PCP: Briscoe Deutscher, DO   LOS: 1 day   Brief narrative: Patient is a 73 y.o. female with PMH of kidney stones, CAD s/p CABG, AS, HTN, bipolar disorder, hypothyroidism, asthma. She presented to the ED on 7/31 with epigastric and right upper quadrant abdominal pain. She took some Pepcid initially which improved the pain.  Pain now returned once higher intensity and also radiated to the back. No urinary symptoms.  She called EMS and was subsequently brought to the ED.   In the ED, patient was afebrile, blood pressure elevated to 173/67. WBC count 3.8, BUN/creatinine 12/0.93, calcium 8.5, Liver enzymes level elevated, AST/ALT 390/219 with normal alk phos. Urinalysis showed elevated WBC count, negative nitrite, no bacteria. CT abdomen renal protocol showed severe right hydronephrosis with 1.2 cm mid ureteral calculus.  Also multiple nonobstructing left renal calculi.  Cholelithiasis with 5 mm pulmonary nodules in the right lung base.   Abdominal ultrasound showed cholelithiasis without acute cholecystitis as well as fatty liver.  Patient was admitted for bilateral nephrolithiasis, severe right hydronephrosis as well as elevated liver enzymes.  Subjective: Patient was seen and examined this morning.  Pleasant elderly Caucasian female.  Lying in bed.  Not in distress.  Was seen by urology earlier. Also reviewed her repeat labs this morning. Liver enzymes level continues to rise, AST/ALT 857/688 this morning.  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  -There was minimal renal parenchyma in the right side indicating that this is a long-term obstruction.  Left kidney appeared normal.  Creatinine normal.  Suspect nonfunctional right kidney. -Noted a plan for a renal scan with Lasix inpatient versus outpatient for confirmation. -May need nephrectomy if nonfunctional right kidney in order to avoid future intra-abdominal infection. -On admission, patient was started on IV Rocephin with suspicion of UTI.  Urinalysis shows pyuria but no evidence of bacterial infection.  Patient does not have symptoms of UTI.  No evidence of infection per urology. I will stop IV Rocephin.  Elevated liver enzymes -AST ALT 390/219 on admission, worsened this morning to 857/688.  Alk phos normal. -CT scan of abdomen and ultrasound abdomen showed no evidence of biliary obstruction but shows fatty liver.  There is cholelithiasis without cholecystitis. -Patient does not give any history of hepatitis in the past.  No recent viral syndrome. -Not in any medicine with risk of liver injury. -Elevated liver enzymes could be due to fatty liver. -Sudden rise in the level of enzymes is concerning.  Obtain hepatitis panel.  Repeat liver enzymes tomorrow.  If continues to worsen, may need GI consult.  Hypertension -Continue amlodipine, metoprolol.  GERD -Continue Protonix daily and nightly Pepcid.  Mobility: Encourage ambulation Diet: Regular diet DVT prophylaxis:  Heparin subcu Code Status:   Code Status: Full Code  Family Communication:  None Expected Discharge:  Anticipate discharge home in next 1 to 2 days  Consultants:  Neurology  Procedures:  None  Antimicrobials:  Anti-infectives (From admission, onward)  Start     Dose/Rate Route Frequency Ordered Stop   01/10/19 2200   cefTRIAXone (ROCEPHIN) 1 g in sodium chloride 0.9 % 100 mL IVPB     1 g 200 mL/hr over 30 Minutes Intravenous Every 24 hours 01/10/19 0131     01/09/19 2315  cefTRIAXone (ROCEPHIN) 2 g in sodium chloride 0.9 % 100 mL IVPB     2 g 200 mL/hr over 30 Minutes Intravenous  Once 01/09/19 2311 01/10/19 0112      Infusions:  . sodium chloride 100 mL/hr at 01/10/19 1120  . cefTRIAXone (ROCEPHIN)  IV      Scheduled Meds: . amLODipine  10 mg Oral QHS  . heparin  5,000 Units Subcutaneous Q8H  . metoprolol succinate  50 mg Oral Daily  .  morphine injection  4 mg Intravenous Once    PRN meds: aspirin-acetaminophen-caffeine, hydrALAZINE, ketorolac, ondansetron **OR** ondansetron (ZOFRAN) IV, pantoprazole   Objective: Vitals:   01/10/19 0118 01/10/19 0555  BP: (!) 166/68 (!) 146/56  Pulse: 68 (!) 57  Resp: 18 18  Temp: 97.8 F (36.6 C) 99.1 F (37.3 C)  SpO2: 99% 94%    Intake/Output Summary (Last 24 hours) at 01/10/2019 1240 Last data filed at 01/10/2019 0900 Gross per 24 hour  Intake 1166.67 ml  Output -  Net 1166.67 ml   Filed Weights   01/10/19 0118  Weight: 93.8 kg   Weight change:  Body mass index is 38.44 kg/m.   Physical Exam: General exam: Appears calm and comfortable.  Skin: No rashes, lesions or ulcers. HEENT: Atraumatic, normocephalic, supple neck, no obvious bleeding Lungs: Clear to auscultation bilaterally CVS: Regular rate and rhythm, no murmur GI/Abd soft, mild tenderness in the right flank, nondistended, bowel sound present CNS: Alert, awake, oriented x3 Psychiatry: Mood appropriate Extremities: No pedal edema, no calf tenderness  Data Review: I have personally reviewed the laboratory data and studies available.  Recent Labs  Lab 01/09/19 1818 01/10/19 0306  WBC 7.6 3.8*  NEUTROABS 5.4  --   HGB 15.4* 14.7  HCT 46.9* 43.2  MCV 94.6 91.7  PLT 174 153   Recent Labs  Lab 01/09/19 1818 01/10/19 0306  NA 140 142  K 3.4* 3.4*  CL 105 110  CO2  25 22  GLUCOSE 107* 96  BUN 18 12  CREATININE 1.14* 0.93  CALCIUM 9.1 8.5*    Terrilee Croak, MD  [facility] 01/10/2019

## 2019-01-11 LAB — COMPREHENSIVE METABOLIC PANEL
ALT: 473 U/L — ABNORMAL HIGH (ref 0–44)
AST: 220 U/L — ABNORMAL HIGH (ref 15–41)
Albumin: 3.1 g/dL — ABNORMAL LOW (ref 3.5–5.0)
Alkaline Phosphatase: 86 U/L (ref 38–126)
Anion gap: 6 (ref 5–15)
BUN: 11 mg/dL (ref 8–23)
CO2: 24 mmol/L (ref 22–32)
Calcium: 8.4 mg/dL — ABNORMAL LOW (ref 8.9–10.3)
Chloride: 111 mmol/L (ref 98–111)
Creatinine, Ser: 1.02 mg/dL — ABNORMAL HIGH (ref 0.44–1.00)
GFR calc Af Amer: >60 mL/min (ref >60)
GFR calc non Af Amer: 55 mL/min — ABNORMAL LOW (ref >60)
Glucose, Bld: 79 mg/dL (ref 70–99)
Potassium: 3.8 mmol/L (ref 3.5–5.1)
Sodium: 141 mmol/L (ref 135–145)
Total Bilirubin: 0.9 mg/dL (ref 0.3–1.2)
Total Protein: 5.7 g/dL — ABNORMAL LOW (ref 6.5–8.1)

## 2019-01-11 LAB — HEPATITIS PANEL, ACUTE
HCV Ab: <0.1 s/co ratio (ref 0.0–0.9)
Hep A IgM: NEGATIVE
Hep B C IgM: NEGATIVE
Hepatitis B Surface Ag: NEGATIVE

## 2019-01-11 LAB — URINE CULTURE: Culture: <10000 — AB

## 2019-01-11 NOTE — Progress Notes (Signed)
Pt discharged home in stable condition after going over discharge teaching with no concerns voiced 

## 2019-01-11 NOTE — Discharge Summary (Signed)
Physician Discharge Summary  Cindy Reyes UXN:[id number] DOB: September 30, 1945 DOA: 01/09/2019  PCP: Briscoe Deutscher, DO  Admit date: 01/09/2019 Discharge date: 01/11/2019  Admitted From: Home Discharge disposition: Home   Code Status: Full Code   Recommendations for Outpatient Follow-Up:   1. Follow-up with urology for a renal scan with Lasix as an outpatient. 2. Follow-up with primary care provider for repeat liver enzymes in a week.  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 She presented to the ED on 7/31 with epigastric and right upper quadrant abdominal pain. She took some Pepcid initially which improved the pain.  Pain now returned once higher intensity and also radiated to the back. No urinary symptoms.  She called EMS and was subsequently brought to the ED.   In the ED, patient was afebrile, blood pressure elevated to 173/67. WBC count 3.8, BUN/creatinine 12/0.93, calcium 8.5, Liver enzymes level elevated, AST/ALT 390/219 with normal alk phos. Urinalysis showed elevated WBC count, negative nitrite, no bacteria. CT abdomen renal protocol showed severe right hydronephrosis with 1.2 cm mid ureteral calculus. Also multiple nonobstructing left renal calculi. Cholelithiasis with 5 mm pulmonary nodules in the right lung base.  Abdominal ultrasound showed cholelithiasis  without acute cholecystitis as well as fatty liver.  Patient was admitted for bilateral nephrolithiasis, severe right hydronephrosis as well as elevated liver enzymes.   Subjective:  Patient was seen and examined this morning.  Pleasant elderly Caucasian female.  Sitting up in chair.  Not in distress.  Liver enzymes improving this morning. No fever.  WBC count normal.  Hospital Course:  Bilateral nephrolithiasis  Severe right hydronephrosis with ureteral calculus -Urology consult appreciated.  -There was minimal renal parenchyma in the right side indicating that this is a long-term obstruction.  Left kidney appeared normal.  Creatinine normal.  Suspect nonfunctional right kidney. -Noted a plan from urology for a renal scan with Lasix inpatient versus outpatient for confirmation.  Unable to do the study as an inpatient in the weekend.  Ordered for outpatient study. -May need nephrectomy if nonfunctional right kidney in order to avoid future intra-abdominal infection. -On admission, patient was started on IV Rocephin with suspicion of UTI.  Urinalysis shows pyuria but no evidence of bacterial infection.  Patient does not have symptoms of UTI.  No evidence of infection per urology.  I stopped IV Rocephin.  Elevated liver enzymes -AST ALT 390/219 on admission, worsened next morning morning to 857/688.    Levels improved to 220/473 this morning.  Alk phos level has remained normal throughout. -CT scan of abdomen and ultrasound abdomen did not show any evidence of biliary obstruction but shows fatty liver.  There is cholelithiasis without cholecystitis. -Patient does not give any history of hepatitis in the past.  No recent viral syndrome. -Not in any medicine with risk of liver injury. -Elevated liver enzymes could be due to fatty liver. -Acute hepatitis panel was obtained.  Negative. -Patient is to follow-up with her PCP as an outpatient for repeat liver enzyme check in a  week.  Hypertension -Continue amlodipine, metoprolol.  GERD -Continue Protonix daily and nightly Pepcid.  Stable for discharge home today.  Discharge Exam:   Vitals:   01/10/19 0555 01/10/19 1426 01/10/19 1949 01/11/19 0537  BP: (!) 146/56 (!) 138/55 114/84 (!) 136/59  Pulse: (!) 57 61 63 (!) 49  Resp: _0 Temp: 99.1 F (37.3 C) 97.8 F (36.6 C) 98.6 F (37 C) 98.4 F (36.9 C)  TempSrc: Oral Oral Oral Oral  SpO2: 94% 95% 99% 96%  Weight:      Height:        Body mass index is 38.44 kg/m.  General exam: Appears calm and comfortable.  Not in distress Skin: No rashes, lesions or ulcers. HEENT: Atraumatic, normocephalic, supple neck, no obvious bleeding Lungs: Clear to auscultation bilaterally CVS: Regular rate and rhythm, no murmur GI/Abd soft, nontender, nondistended, bowel sound present CNS: Alert, awake, oriented x3 Psychiatry: Mood appropriate Extremities: No pedal edema, no calf tenderness  Discharge Instructions:  Wound care: None Discharge Instructions    Diet - low sodium heart healthy   Complete by: As directed    Increase activity slowly   Complete by: As directed       Allergies as of 01/11/2019      Reactions   Atorvastatin Other (See Comments)   HBP and Headaches   Coconut Fatty Acids Other (See Comments)   Reaction??   Lidocaine Other (See Comments)   Hallucinations   Other Other (See Comments)   -caine(s) = Hallucinations   Statins Other (See Comments)   Headaches   Latex Itching   Losartan Other (See Comments)   HBP, Headache   Penicillins Hives, Itching   Reaction in [place] (??) Has patient had a PCN reaction causing immediate rash, facial/tongue/throat swelling, SOB or lightheadedness with hypotension: Yes Has patient had a PCN reaction causing severe rash involving mucus membranes or skin necrosis: No Has patient had a PCN reaction that required hospitalization: No Has patient had a PCN reaction occurring within  the last 10 years: No If all of the above answers are "NO", then may proceed with Cephalosporin use.      Medication List    TAKE these medications   amLODipine 10 MG tablet Commonly known as: NORVASC TAKE 1 TABLET BY MOUTH EVERYDAY AT BEDTIME What changed: See the new instructions.   aspirin-acetaminophen-caffeine 250-250-65 MG tablet Commonly known as: EXCEDRIN MIGRAINE Take 1-2 tablets by mouth every 6 (six) hours as needed for headache or migraine.   famotidine 20 MG tablet Commonly known as: Pepcid One at bedtime   metoprolol succinate 50 MG 24 hr tablet Commonly known as: TOPROL-XL Take 1 tablet (50 mg total) by mouth daily.   PriLOSEC OTC 20 MG tablet Generic drug: omeprazole Take 20 mg by mouth See admin instructions. Take 20 mg by mouth one to two times a day as needed for reflux/indigestion   Vitamin D3 1.25 MG (50000 UT) Caps TAKE ONE CAPSULE BY MOUTH EVERY WEEK FOR 8 WEEKS       Time coordinating discharge: 35 minutes  The results of significant diagnostics from this hospitalization (including imaging, microbiology, ancillary and laboratory) are listed below for reference.    Procedures and Diagnostic Studies:   Dg Chest 2 View  Result Date: 01/09/2019 CLINICAL DATA:  Per [facility], pt from [facility] w/ a c/o  CP and epigastric pain. The epigastric pain has been ongoing to for 2 days. She was recently diagnosed with GERD. She took Famotidine w/ some relief of pain and nausea. No vomiting. CP was located on the left side of her chest and radiated to the right. She reports that the CP was only present for a couple of seconds and then subsided. EXAM: CHEST - 2 VIEW COMPARISON:  04/10/2018 FINDINGS: Stable changes from a prior median sternotomy. The cardiac silhouette is normal in size no mediastinal or hilar masses. No evidence of adenopathy. Mild linear atelectasis or scarring lies above and elevated right hemidiaphragm, stable. Lungs are otherwise clear.  No pleural effusion or pneumothorax. Skeletal structures are intact. IMPRESSION: No acute cardiopulmonary disease. Electronically Signed   By: Lajean Manes M.D.   On: 01/09/2019 19:10   Ct Renal Stone Study  Result Date: 01/09/2019 CLINICAL DATA:  Hydronephrosis EXAM: CT ABDOMEN AND PELVIS WITHOUT CONTRAST TECHNIQUE: Multidetector CT imaging of the abdomen and pelvis was performed following the standard protocol without IV contrast. COMPARISON:  Ultrasound same day FINDINGS: Lower chest: The visualized heart size within normal limits. Aortic and mitral valve calcifications are seen. No pericardial fluid/thickening. A small hiatal hernia is present. There is a tiny 5 mm posterior right base pulmonary nodule. Hepatobiliary: Although limited due to the lack of intravenous contrast, normal in appearance without gross focal abnormality. Layering gallstones are present. Pancreas:  Unremarkable.  No surrounding inflammatory changes. Spleen: Normal in size. Although limited due to the lack of intravenous contrast, normal in appearance. Adrenals/Urinary Tract: Both adrenal glands appear normal. There is severe right pelvicaliectasis and ureterectasis down to the level of the mid ureter where there is a 1.2 cm ureteral calculus. The distal ureter is decompressed. There several punctate subcentimeter renal calculi on the left without hydronephrosis. No bladder calculi are seen. Stomach/Bowel: The stomach, small bowel, and colon are normal in appearance. No inflammatory changes or obstructive findings. Vascular/Lymphatic: There are no enlarged abdominal or pelvic lymph nodes. Scattered aortic atherosclerotic calcifications are seen without aneurysmal dilatation. Reproductive: There are large homogeneous soft tissue masses within the uterus 1 partially exophytic at the posterior right uterine wall and 1 extending into the endometrial canal, likely subserosal. Other: No evidence of abdominal wall mass or hernia.  Musculoskeletal: No acute or significant osseous findings. IMPRESSION: 1. Severe right hydronephrosis caused by a 1.2 cm mid ureteral calculus. 2. Subcentimeter non-obstructing left renal calculi. 3. Cholelithiasis 4. 5 mm pulmonary nodule at the right lung base. No follow-up needed if patient is low-risk. Non-contrast chest CT can be considered in 12 months if patient is high-risk. This recommendation follows the consensus statement: Guidelines for Management of Incidental Pulmonary Nodules Detected on CT Images: From the Fleischner Society 2017; Radiology 2017; 284:228-243. 5. 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 However if further evaluation is required would recommend pelvic ultrasound. Electronically Signed   By: Prudencio Pair M.D.   On: 01/09/2019 22:59   US Abdomen Limited Ruq  Result Date: 01/09/2019 CLINICAL DATA:  Abdomen pain EXAM: ULTRASOUND ABDOMEN LIMITED RIGHT UPPER QUADRANT COMPARISON:  None. FINDINGS: Gallbladder: Gallstones are identified. There is no gallbladder wall thickening. No sonographic Murphy sign or pericholecystic fluid is identified. Common bile duct: Diameter: 7.5 mm. Liver: No focal lesion identified. There is diffuse increased echotexture of the liver. Portal vein is patent on color Doppler imaging with normal direction of blood flow towards the liver. Other: Incidental finding of severe right  hydronephrosis. IMPRESSION: Cholelithiasis without sonographic evidence of acute cholecystitis. Diffuse increased echotexture of the liver, nonspecific, can be seen in fatty infiltration of liver. Severe right hydronephrosis. Electronically Signed   By: Abelardo Diesel M.D.   On: 01/09/2019 21:19     Labs:   Basic Metabolic Panel: Recent Labs  Lab 01/09/19 1818 01/10/19 0306 01/11/19 0231  NA 140 142 141  K 3.4* 3.4* 3.8  CL 105 110 111  CO2 _0 GLUCOSE 107* 96 79  BUN _1 CREATININE 1.14* 0.93 1.02*   CALCIUM 9.1 8.5* 8.4*   GFR Estimated Creatinine Clearance: 53.4 mL/min (A) (by C-G formula based on SCr of 1.02 mg/dL (H)). Liver Function Tests: Recent Labs  Lab 01/09/19 1818 01/10/19 0306 01/11/19 0231  AST 390* 857* 220*  ALT 219* 688* 473*  ALKPHOS 92 106 86  BILITOT 1.6* 1.6* 0.9  PROT 6.8 6.0* 5.7*  ALBUMIN 3.9 3.5 3.1*   Recent Labs  Lab 01/09/19 1818  LIPASE 47   No results for input(s): AMMONIA in the last 168 hours. Coagulation profile No results for input(s): INR, PROTIME in the last 168 hours.  CBC: Recent Labs  Lab 01/09/19 1818 01/10/19 0306  WBC 7.6 3.8*  NEUTROABS 5.4  --   HGB 15.4* 14.7  HCT 46.9* 43.2  MCV 94.6 91.7  PLT 174 153   Cardiac Enzymes: No results for input(s): CKTOTAL, CKMB, CKMBINDEX, TROPONINI in the last 168 hours. BNP: Invalid input(s): POCBNP CBG: No results for input(s): GLUCAP in the last 168 hours. D-Dimer No results for input(s): DDIMER in the last 72 hours. Hgb A1c No results for input(s): HGBA1C in the last 72 hours. Lipid Profile No results for input(s): CHOL, HDL, LDLCALC, TRIG, CHOLHDL, LDLDIRECT in the last 72 hours. Thyroid function studies No results for input(s): TSH, T4TOTAL, T3FREE, THYROIDAB in the last 72 hours.  Invalid input(s): FREET3 Anemia work up No results for input(s): VITAMINB12, FOLATE, FERRITIN, TIBC, IRON, RETICCTPCT in the last 72 hours. Microbiology Recent Results (from the past 240 hour(s))  Urine culture     Status: Abnormal   Collection Time: 01/09/19  9:30 PM   Specimen: Urine, Clean Catch  Result Value Ref Range Status   Specimen Description URINE, CLEAN CATCH  Final   Special Requests NONE  Final   Culture (A)  Final    <10,000 COLONIES/mL INSIGNIFICANT GROWTH Performed at [facility], [address], [place], [place] [address]    Report Status 01/11/2019 FINAL  Final  SARS Coronavirus 2 [facility] order, Performed in [facility] hospital lab) Nasopharyngeal  Nasopharyngeal Swab     Status: None   Collection Time: 01/09/19 10:16 PM   Specimen: Nasopharyngeal Swab  Result Value Ref Range Status   SARS Coronavirus 2 NEGATIVE NEGATIVE Final    Comment: (NOTE) If result is NEGATIVE SARS-CoV-2 target nucleic acids are NOT DETECTED. The SARS-CoV-2 RNA is generally detectable in upper and lower  respiratory specimens during the acute phase of infection. The lowest  concentration of SARS-CoV-2 viral copies this assay can detect is 250  copies / mL. A negative result does not preclude SARS-CoV-2 infection  and should not be used as the sole basis for treatment or other  patient management decisions.  A negative result may occur with  improper specimen collection / handling, submission of specimen other  than nasopharyngeal swab, presence of viral mutation(s) within the  areas targeted by this assay, and inadequate number of viral copies  (<  250 copies / mL). A negative result must be combined with clinical  observations, patient history, and epidemiological information. If result is POSITIVE SARS-CoV-2 target nucleic acids are DETECTED. The SARS-CoV-2 RNA is generally detectable in upper and lower  respiratory specimens dur ing the acute phase of infection.  Positive  results are indicative of active infection with SARS-CoV-2.  Clinical  correlation with patient history and other diagnostic information is  necessary to determine patient infection status.  Positive results do  not rule out bacterial infection or co-infection with other viruses. If result is PRESUMPTIVE POSTIVE SARS-CoV-2 nucleic acids MAY BE PRESENT.   A presumptive positive result was obtained on the submitted specimen  and confirmed on repeat testing.  While 2019 novel coronavirus  (SARS-CoV-2) nucleic acids may be present in the submitted sample  additional confirmatory testing may be necessary for epidemiological  and / or clinical management purposes  to differentiate between   SARS-CoV-2 and other Sarbecovirus currently known to infect humans.  If clinically indicated additional testing with an alternate test  methodology [phone]) is advised. The SARS-CoV-2 RNA is generally  detectable in upper and lower respiratory sp ecimens during the acute  phase of infection. The expected result is Negative. Fact Sheet for Patients:  StrictlyIdeas.no Fact Sheet for Healthcare Providers: BankingDealers.co.za This test is not yet approved or cleared by the [place] FDA and has been authorized for detection and/or diagnosis of SARS-CoV-2 by FDA under an Emergency Use Authorization (EUA).  This EUA will remain in effect (meaning this test can be used) for the duration of the COVID-19 declaration under Section 564(b)(1) of the Act, 21 U.S.C. section 360bbb-3(b)(1), unless the authorization is terminated or revoked sooner. Performed at [facility], [place] [address] [place], [place] [address]     Signed: Terrilee Croak  [facility] 01/11/2019, 10:05 AM

## 2019-01-12 ENCOUNTER — Telehealth: Payer: Self-pay

## 2019-01-12 NOTE — Telephone Encounter (Signed)
LM for patient to return call for TCM. 

## 2019-01-13 ENCOUNTER — Telehealth: Payer: Self-pay

## 2019-01-13 NOTE — Telephone Encounter (Signed)
LM for patient to return call for TCM. 

## 2019-01-16 ENCOUNTER — Encounter: Payer: Self-pay | Admitting: Family Medicine

## 2019-01-19 DIAGNOSIS — M349 Systemic sclerosis, unspecified: Secondary | ICD-10-CM | POA: Diagnosis not present

## 2019-01-19 DIAGNOSIS — I119 Hypertensive heart disease without heart failure: Secondary | ICD-10-CM | POA: Diagnosis not present

## 2019-01-19 DIAGNOSIS — N132 Hydronephrosis with renal and ureteral calculous obstruction: Secondary | ICD-10-CM | POA: Diagnosis not present

## 2019-01-19 DIAGNOSIS — R300 Dysuria: Secondary | ICD-10-CM | POA: Diagnosis not present

## 2019-01-19 DIAGNOSIS — N183 Chronic kidney disease, stage 3 (moderate): Secondary | ICD-10-CM | POA: Diagnosis not present

## 2019-01-19 DIAGNOSIS — I700 Atherosclerosis of aorta: Secondary | ICD-10-CM | POA: Diagnosis not present

## 2019-01-19 DIAGNOSIS — E559 Vitamin D deficiency, unspecified: Secondary | ICD-10-CM | POA: Diagnosis not present

## 2019-01-22 ENCOUNTER — Other Ambulatory Visit: Payer: Self-pay

## 2019-01-22 ENCOUNTER — Ambulatory Visit (HOSPITAL_COMMUNITY)
Admission: RE | Admit: 2019-01-22 | Discharge: 2019-01-22 | Disposition: A | Discharge status: Home / Self Care | Payer: Medicare Other | Source: Ambulatory Visit | Attending: Internal Medicine | Admitting: Internal Medicine

## 2019-01-22 DIAGNOSIS — N171 Acute kidney failure with acute cortical necrosis: Secondary | ICD-10-CM | POA: Insufficient documentation

## 2019-01-22 DIAGNOSIS — N1330 Unspecified hydronephrosis: Secondary | ICD-10-CM | POA: Diagnosis not present

## 2019-01-22 DIAGNOSIS — N289 Disorder of kidney and ureter, unspecified: Secondary | ICD-10-CM | POA: Insufficient documentation

## 2019-01-22 HISTORY — DX: Disorder of kidney and ureter, unspecified: N28.9

## 2019-01-22 MED ORDER — FUROSEMIDE 10 MG/ML IJ SOLN
47.0000 mg | Freq: Once | INTRAMUSCULAR | Status: AC
  Administered 2019-01-22: 47 mg via INTRAVENOUS

## 2019-01-22 MED ORDER — FUROSEMIDE 10 MG/ML IJ SOLN
INTRAMUSCULAR | Status: AC
  Filled 2019-01-22: qty 2

## 2019-01-22 MED ORDER — FUROSEMIDE 10 MG/ML IJ SOLN
INTRAMUSCULAR | Status: AC
  Filled 2019-01-22: qty 4

## 2019-01-22 MED ORDER — TECHNETIUM TC 99M MERTIATIDE
5.4100 | Freq: Once | INTRAVENOUS | Status: AC | PRN
  Administered 2019-01-22: 5.41 via INTRAVENOUS

## 2019-01-26 ENCOUNTER — Encounter: Payer: Self-pay | Admitting: Family Medicine

## 2019-01-27 ENCOUNTER — Encounter: Payer: Self-pay | Admitting: Family Medicine

## 2019-01-28 ENCOUNTER — Ambulatory Visit: Payer: Medicare Other | Admitting: Family Medicine

## 2019-01-28 ENCOUNTER — Telehealth: Payer: Self-pay

## 2019-01-28 NOTE — Telephone Encounter (Signed)
Called patient to reschedule appointment. She did not wish to make another appointment. She wishes to establish with another office. She feels like things are not addressed in timely manner with our office. I did let her know that I would send you a message letting you know as well. She wanted [place] to call her back if any results needed to be discussed about last imaging done. I have removed you as PCP in patients chart.

## 2019-01-29 NOTE — Telephone Encounter (Signed)
Sounds good.   For documentation: On the day of the result of renal scan, I sent a message to our RN lead to help me identify the person responsible for the result. Patient was called twice for hospital follow up. No appointment made. Patient called this week for results of scan. I spoke with RN Lead and was informed that she had discussed with Practice Admin and that I would be responsible for result and follow up even though I did not order the scan and had not seen the patient for this issue. There had been no follow up to let me know this.   Patient needs to see Renal. Okay referral, though this should have (and may have) already been completed by hospital providers. It would have been taken care of immediately if she had followed up after the hospital visit as recommended.

## 2019-02-02 ENCOUNTER — Encounter: Payer: Self-pay | Admitting: Family Medicine

## 2019-02-02 DIAGNOSIS — N261 Atrophy of kidney (terminal): Secondary | ICD-10-CM | POA: Diagnosis not present

## 2019-02-02 DIAGNOSIS — N132 Hydronephrosis with renal and ureteral calculous obstruction: Secondary | ICD-10-CM | POA: Diagnosis not present

## 2019-02-02 DIAGNOSIS — N202 Calculus of kidney with calculus of ureter: Secondary | ICD-10-CM | POA: Diagnosis not present

## 2019-02-05 NOTE — Telephone Encounter (Signed)
Added to my chat message.

## 2019-02-09 DIAGNOSIS — R740 Nonspecific elevation of levels of transaminase and lactic acid dehydrogenase [LDH]: Secondary | ICD-10-CM | POA: Diagnosis not present

## 2019-02-09 DIAGNOSIS — R7989 Other specified abnormal findings of blood chemistry: Secondary | ICD-10-CM | POA: Diagnosis not present

## 2019-02-09 DIAGNOSIS — K8020 Calculus of gallbladder without cholecystitis without obstruction: Secondary | ICD-10-CM | POA: Diagnosis not present

## 2019-02-12 ENCOUNTER — Other Ambulatory Visit: Payer: Self-pay

## 2019-02-12 DIAGNOSIS — K8020 Calculus of gallbladder without cholecystitis without obstruction: Secondary | ICD-10-CM

## 2019-02-12 DIAGNOSIS — M940 Chondrocostal junction syndrome [Tietze]: Secondary | ICD-10-CM

## 2019-02-12 DIAGNOSIS — N200 Calculus of kidney: Secondary | ICD-10-CM

## 2019-02-12 NOTE — Telephone Encounter (Signed)
Referral to surgery placed no other action needed pt has seen urologist already

## 2019-04-08 ENCOUNTER — Telehealth (HOSPITAL_BASED_OUTPATIENT_CLINIC_OR_DEPARTMENT_OTHER): Payer: Self-pay | Admitting: Internal Medicine

## 2019-04-08 NOTE — Telephone Encounter (Signed)
RETURN CALL: Voicemail - Detailed Message [phone]    SUBJECT: General Message     MESSAGE: Patient is seeking a PCP, and due to multiple chronic health conditions and advanced age, would like clinic to review for possible age limit exception. Her current out of state PCP, Dr. Buzzy Han @ [facility] in [place], [place], will send records. If records are not received within a couple days, please reach out to patient's daughter, Genevive Bi, to advise.    Thank you!

## 2019-04-08 NOTE — Telephone Encounter (Signed)
Unable to pull records through [facility]. Will wait for notes from [place] per dtr.

## 2019-04-16 ENCOUNTER — Telehealth (INDEPENDENT_AMBULATORY_CARE_PROVIDER_SITE_OTHER): Payer: Self-pay | Admitting: Urology

## 2019-04-16 NOTE — Telephone Encounter (Signed)
LVM to pt daughter asking if pt had CD of images if not might be best to reschedule when images were received asked for a call back

## 2019-04-17 NOTE — Telephone Encounter (Signed)
Incoming call from pt daughter asker her if she had CD of images she stated that they did not, let pt know we could schedule with PA to establish care, pt daughter was unsure because pt has medicare and does have a supplemental plan but for [place], let pt know that we would request images and that we would keep them updated     Telephone call to pt daughter gave her number to financial services to see if insurance would cover visit, then let pt know best to see Dr. Deliah Boston as he does nephrectomy's pt daughter understood and had no further questions

## 2019-04-18 ENCOUNTER — Other Ambulatory Visit: Payer: Self-pay | Admitting: Student in an Organized Health Care Education/Training Program

## 2019-04-18 ENCOUNTER — Emergency Department (HOSPITAL_BASED_OUTPATIENT_CLINIC_OR_DEPARTMENT_OTHER)
Admission: EM | Admit: 2019-04-18 | Discharge: 2019-04-18 | Disposition: A | Discharge status: Home / Self Care | Payer: Medicare HMO | Attending: Emergency Medicine | Admitting: Emergency Medicine

## 2019-04-18 ENCOUNTER — Other Ambulatory Visit: Payer: Self-pay

## 2019-04-18 ENCOUNTER — Encounter: Payer: Self-pay | Admitting: Student in an Organized Health Care Education/Training Program

## 2019-04-18 DIAGNOSIS — N3090 Cystitis, unspecified without hematuria: Secondary | ICD-10-CM | POA: Diagnosis not present

## 2019-04-18 DIAGNOSIS — R0602 Shortness of breath: Secondary | ICD-10-CM | POA: Diagnosis not present

## 2019-04-18 DIAGNOSIS — Z23 Encounter for immunization: Secondary | ICD-10-CM | POA: Diagnosis not present

## 2019-04-18 DIAGNOSIS — R42 Dizziness and giddiness: Secondary | ICD-10-CM | POA: Diagnosis not present

## 2019-04-18 DIAGNOSIS — Z419 Encounter for procedure for purposes other than remedying health state, unspecified: Secondary | ICD-10-CM

## 2019-04-18 LAB — CBC, DIFF
% Basophils: 1 %
% Eosinophils: 3 %
% Immature Granulocytes: 0 %
% Lymphocytes: 21 %
% Monocytes: 10 %
% Neutrophils: 65 %
% Nucleated RBC: 0 %
Absolute Eosinophil Count: 0.22 10*3/uL (ref 0.00–0.50)
Absolute Lymphocyte Count: 1.37 10*3/uL (ref 1.00–4.80)
Basophils: 0.05 10*3/uL (ref 0.00–0.20)
Hematocrit: 47 % — ABNORMAL HIGH (ref 36–45)
Hemoglobin: 15.9 g/dL — ABNORMAL HIGH (ref 11.5–15.5)
Immature Granulocytes: 0.01 10*3/uL (ref 0.00–0.05)
MCH: 31.9 pg (ref 27.3–33.6)
MCHC: 33.8 g/dL (ref 32.2–36.5)
MCV: 94 fL (ref 81–98)
Monocytes: 0.66 10*3/uL (ref 0.00–0.80)
Neutrophils: 4.22 10*3/uL (ref 1.80–7.00)
Nucleated RBC: 0 10*3/uL
Platelet Count: 190 10*3/uL (ref 150–400)
RBC: 4.99 10*6/uL (ref 3.80–5.00)
RDW-CV: 13 % (ref 11.6–14.4)
WBC: 6.53 10*3/uL (ref 4.30–10.00)

## 2019-04-18 LAB — COMPREHENSIVE METABOLIC PANEL
ALT (GPT): 17 U/L (ref 7–33)
AST (GOT): 15 U/L (ref 9–38)
Albumin: 4.4 g/dL (ref 3.5–5.2)
Alkaline Phosphatase (Total): 68 U/L (ref 38–172)
Anion Gap: 7 (ref 4–12)
Bilirubin (Total): 0.7 mg/dL (ref 0.2–1.3)
Calcium: 9.6 mg/dL (ref 8.9–10.2)
Carbon Dioxide, Total: 29 meq/L (ref 22–32)
Chloride: 103 meq/L (ref 98–108)
Creatinine: 0.91 mg/dL (ref 0.38–1.02)
Glucose: 84 mg/dL (ref 62–125)
Potassium: 3.9 meq/L (ref 3.6–5.2)
Protein (Total): 7.2 g/dL (ref 6.0–8.2)
Sodium: 139 meq/L (ref 135–145)
Urea Nitrogen: 15 mg/dL (ref 8–21)
eGFR by CKD-EPI: >60 mL/min/{1.73_m2} (ref >59)

## 2019-04-18 LAB — URINALYSIS WITH REFLEX CULTURE
Bacteria, URN: NONE SEEN
Bilirubin (Qual), URN: NEGATIVE
Epith Cells_Renal/Trans,URN: NEGATIVE /HPF
Glucose Qual, URN: NEGATIVE mg/dL
Ketones, URN: NEGATIVE mg/dL
Leukocyte Esterase, URN: POSITIVE — AB
Nitrite, URN: NEGATIVE
Protein (Alb Semiquant), URN: NEGATIVE mg/dL
Specific Gravity, URN: 1.008 g/mL (ref 1.006–1.027)
pH, URN: 6.5 (ref 5.0–8.0)

## 2019-04-18 LAB — PROTHROMBIN & PTT
Partial Thromboplastin Time: 30 s (ref 22–35)
Prothrombin INR: 1 (ref 0.8–1.3)
Prothrombin Time Patient: 12.9 s (ref 10.7–15.6)

## 2019-04-18 LAB — REFLEX CULTURE FOR UA

## 2019-04-20 ENCOUNTER — Encounter (INDEPENDENT_AMBULATORY_CARE_PROVIDER_SITE_OTHER): Payer: Medicare HMO | Admitting: Urology

## 2019-04-20 LAB — EKG 12 LEAD
Atrial Rate: 61 {beats}/min
Diagnosis: NORMAL
P Axis: 65 degrees
P-R Interval: 202 ms
Q-T Interval: 428 ms
QRS Duration: 64 ms
QTC Calculation: 430 ms
R Axis: 70 degrees
T Axis: 82 degrees
Ventricular Rate: 61 {beats}/min

## 2019-04-20 LAB — URINE C/S: Culture: 51000

## 2019-04-27 ENCOUNTER — Encounter (INDEPENDENT_AMBULATORY_CARE_PROVIDER_SITE_OTHER): Payer: Self-pay | Admitting: Urology

## 2019-04-27 ENCOUNTER — Ambulatory Visit (INDEPENDENT_AMBULATORY_CARE_PROVIDER_SITE_OTHER): Payer: Medicare HMO | Admitting: Urology

## 2019-04-27 DIAGNOSIS — E213 Hyperparathyroidism, unspecified: Secondary | ICD-10-CM | POA: Diagnosis not present

## 2019-04-27 DIAGNOSIS — N200 Calculus of kidney: Secondary | ICD-10-CM | POA: Diagnosis not present

## 2019-04-27 DIAGNOSIS — K8020 Calculus of gallbladder without cholecystitis without obstruction: Secondary | ICD-10-CM | POA: Diagnosis not present

## 2019-04-27 NOTE — Patient Instructions (Signed)
1. We'll place referrals to Endocrinology, General Surgery and to one of our Urology [facility].  2. Before the Urology appointment, please collect a 24 hour urine sample for stone metabolic profile.  3. Call sooner if having problems related to the right kidney especially fever, flank pain, nausea and especially all 3 symptoms or concern about urinary infection.

## 2019-04-27 NOTE — Progress Notes (Signed)
Wendy [name], saw and evaluated this patient with Dr. Ovid Curd Colon, MD Resident. I have reviewed the findings and management plan that we developed together, as he has documented. I (1) personally performed the patient's physical exam, which revealed a nonfunctioning hydronephrotic right kidney with an obstructive right ureteral stone.  We reviewed all pertinent (2) laboratory studies, which demonstrated normal renal function and normal calcium level and radiologic studies, which demonstrated the hydronephrotic kidney with minimal residual parenchyma with a 2 cm ureteral stone and also small left nonobstructive stones, as well as a nuclear renogram showing only 8% function of the right kidney. I provided additional counseling concerning the patient's condition and treatment plan. (3) Specifically, in the absence of symptoms we recommend only observation for the right kidney.  Should she develop symptoms such as pain or infection, then would recommend nephrectomy.  We counseled her to follow with endocrinology for follow-up of her hyperparathyroidism.  We counseled her to follow with general surgery for her cholelithiasis as this was her presenting symptom.  We will also arrange for a 24-hour urine profile for stone and arrange for follow-up with one of our stone colleagues to discuss treatment small stones in her left kidney that is essentially a solitary kidney.
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
## 2019-05-05 ENCOUNTER — Telehealth (INDEPENDENT_AMBULATORY_CARE_PROVIDER_SITE_OTHER): Payer: Self-pay | Admitting: Urology

## 2019-05-05 NOTE — Telephone Encounter (Signed)
Called and LVM for pt to schedule a pre-op appt with Dr.Sweet per Dr.Dash's last clinic notes. Provided the [facility] phone number for a return call.

## 2019-05-15 NOTE — Telephone Encounter (Signed)
Second Attempt:  Recall letter created to have pt call clinic to schedule a pre-op visit with Dr.Sweet per Dr.Dash's last clinic note.

## 2019-05-18 ENCOUNTER — Telehealth (INDEPENDENT_AMBULATORY_CARE_PROVIDER_SITE_OTHER): Payer: Self-pay | Admitting: Urology

## 2019-05-18 NOTE — Telephone Encounter (Signed)
Records found in [facility]. Self-referral created and sent for medical review.

## 2019-05-18 NOTE — Telephone Encounter (Signed)
RETURN CALL: Voicemail - Detailed Message      SUBJECT:  Appointment Request     REASON FOR VISIT: follow up from initial appointment  PREFERRED DATE/TIME: n/a  ADDITIONAL INFORMATION: Patient's daughter called to make the follow up appointment, scheduling will have to be done with daughter because she takes the patient to her appointments.

## 2019-05-19 ENCOUNTER — Encounter (HOSPITAL_BASED_OUTPATIENT_CLINIC_OR_DEPARTMENT_OTHER): Payer: Medicare HMO | Admitting: Endocrinology

## 2019-05-19 NOTE — Telephone Encounter (Addendum)
You have chosen to receive care through the use of telemedicine. Telemedicine enables health care providers at different locations to provide safe, effective, and convenient care through the use of technology. As with any health care service, there are risks associated with the use of telemedicine, including equipment failure, poor image resolution, and information security issues.     Do you understand the risks and benefits of telemedicine as I have explained them to you? Yes   You understand that your telemedicine visit will be billed to your insurance in the same manner as a regular in person visit? Yes   Have your questions regarding telemedicine been answered? Yes   Do you consent to the use of telemedicine in your medical care? Yes     You may receive additional information regarding your telehealth visit either via Ecare message or through a text message- which would you prefer? Text Message  Pt: [phone]  Daughter: [phone]  Son: [phone]

## 2019-05-19 NOTE — Telephone Encounter (Signed)
Called and LVM for pt's daughter, Hulan Saas, to schedule a consultation appt with Dr.Sweet per referral from Dr.Dash. Provided the [facility] phone number for a return call.

## 2019-05-25 ENCOUNTER — Telehealth (HOSPITAL_BASED_OUTPATIENT_CLINIC_OR_DEPARTMENT_OTHER): Payer: Self-pay | Admitting: Adult Health

## 2019-05-25 NOTE — Telephone Encounter (Signed)
RETURN CALL: Voicemail - Detailed Message      SUBJECT:  Appointment Request     REASON FOR VISIT: per referral [id number]  PREFERRED DATE/TIME: na  ADDITIONAL INFORMATION:   Please contact to discuss.  Thank you.     The CCR attempted to warm transfer, but care team was assisting other patients.

## 2019-05-25 NOTE — Telephone Encounter (Signed)
Called and spoke to [employer]. She would like pt to establish w/ Dr. Ronalee Red but writer informed her next availability is March. Asked if interested in seeing ARNP for initial assessment first and dtr was agreeable. Appt scheduled for Telemed on 12/28 at 12:30pm. Zoom link sent via eCare.

## 2019-05-28 ENCOUNTER — Encounter (INDEPENDENT_AMBULATORY_CARE_PROVIDER_SITE_OTHER): Payer: Self-pay | Admitting: Urology

## 2019-05-28 NOTE — Telephone Encounter (Signed)
Replied to pt via ecare

## 2019-06-01 ENCOUNTER — Ambulatory Visit: Payer: Medicare HMO | Attending: "Endocrinology | Admitting: "Endocrinology

## 2019-06-01 VITALS — BP 135/6 | HR 78 | Ht 60.0 in | Wt 206.0 lb

## 2019-06-01 DIAGNOSIS — Z713 Dietary counseling and surveillance: Secondary | ICD-10-CM

## 2019-06-01 DIAGNOSIS — M8000XS Age-related osteoporosis with current pathological fracture, unspecified site, sequela: Secondary | ICD-10-CM | POA: Insufficient documentation

## 2019-06-01 DIAGNOSIS — E213 Hyperparathyroidism, unspecified: Secondary | ICD-10-CM | POA: Insufficient documentation

## 2019-06-01 DIAGNOSIS — M810 Age-related osteoporosis without current pathological fracture: Secondary | ICD-10-CM | POA: Insufficient documentation

## 2019-06-01 NOTE — Patient Instructions (Signed)
We want to get labs and we are ordering a bone mineral scan.    We do not think your parathyroid hormone or calcium is causing your kidney stones, but we want to check labs and make sure.    We recommend staying very hydrated, drinking lemon water to help prevent the kidney stones. Start using a bicarbonate based toothpaste like Toms to make sure the acid from the lemon water does not damage your teeth.
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
## 2019-06-03 NOTE — Progress Notes (Signed)
I saw and evaluated the patient and agree with Dr.Popli's note.  Prior primary hyperPTH, resolved surgically.

## 2019-06-08 ENCOUNTER — Telehealth (INDEPENDENT_AMBULATORY_CARE_PROVIDER_SITE_OTHER): Payer: Medicare HMO | Admitting: Urology

## 2019-06-08 ENCOUNTER — Ambulatory Visit (HOSPITAL_BASED_OUTPATIENT_CLINIC_OR_DEPARTMENT_OTHER): Payer: Medicare HMO | Attending: Adult Health | Admitting: Adult Health

## 2019-06-08 ENCOUNTER — Encounter (HOSPITAL_BASED_OUTPATIENT_CLINIC_OR_DEPARTMENT_OTHER): Payer: Self-pay | Admitting: Adult Health

## 2019-06-08 DIAGNOSIS — F319 Bipolar disorder, unspecified: Secondary | ICD-10-CM | POA: Insufficient documentation

## 2019-06-08 DIAGNOSIS — F411 Generalized anxiety disorder: Secondary | ICD-10-CM

## 2019-06-08 DIAGNOSIS — I2510 Atherosclerotic heart disease of native coronary artery without angina pectoris: Secondary | ICD-10-CM | POA: Insufficient documentation

## 2019-06-08 DIAGNOSIS — E042 Nontoxic multinodular goiter: Secondary | ICD-10-CM | POA: Insufficient documentation

## 2019-06-08 DIAGNOSIS — J45909 Unspecified asthma, uncomplicated: Secondary | ICD-10-CM | POA: Insufficient documentation

## 2019-06-08 DIAGNOSIS — R262 Difficulty in walking, not elsewhere classified: Secondary | ICD-10-CM

## 2019-06-08 DIAGNOSIS — E782 Mixed hyperlipidemia: Secondary | ICD-10-CM | POA: Insufficient documentation

## 2019-06-08 DIAGNOSIS — I10 Essential (primary) hypertension: Secondary | ICD-10-CM

## 2019-06-08 DIAGNOSIS — E559 Vitamin D deficiency, unspecified: Secondary | ICD-10-CM | POA: Insufficient documentation

## 2019-06-08 DIAGNOSIS — F32A Depression, unspecified: Secondary | ICD-10-CM | POA: Insufficient documentation

## 2019-06-08 DIAGNOSIS — G43909 Migraine, unspecified, not intractable, without status migrainosus: Secondary | ICD-10-CM | POA: Insufficient documentation

## 2019-06-08 DIAGNOSIS — N200 Calculus of kidney: Secondary | ICD-10-CM

## 2019-06-08 DIAGNOSIS — K635 Polyp of colon: Secondary | ICD-10-CM | POA: Insufficient documentation

## 2019-06-08 DIAGNOSIS — E079 Disorder of thyroid, unspecified: Secondary | ICD-10-CM

## 2019-06-08 DIAGNOSIS — I25709 Atherosclerosis of coronary artery bypass graft(s), unspecified, with unspecified angina pectoris: Secondary | ICD-10-CM | POA: Insufficient documentation

## 2019-06-08 DIAGNOSIS — Z905 Acquired absence of kidney: Secondary | ICD-10-CM

## 2019-06-08 NOTE — Patient Instructions (Addendum)
Plan:   Ureteroscopy (camera and laser treatment of the stones in the kidney), which we will work to schedule, you'll receive a telephone call from our office to schedule.  It will require a covid test and a urine sample beforehand.

## 2019-06-08 NOTE — Progress Notes (Signed)
The clinical staff  checked-in the patient, 06/08/19 at 12:26 PM, and was able to go over the [place] disclaimer. The patient is aware of the possible risk and benefits of the TeleMedicine visit.    The clinical staff  was able to review and document the following:     Patient's Primary Chief Complaint/Reason for visit  Allergies  Medication review   Preferred Pharmacy  Screening Questionnaire: PHQ2, Safety/Falls Questionnaire    Patient reports the following:     New onset fever No    New onset cough No    New onset of Shortness of breath No    Immunosuppression N/A    Afraid of falling N/A    Feeling unsteady when walking N/A    Any other concerns from patient or family:

## 2019-06-08 NOTE — Progress Notes (Signed)
[facility]: Distant Site Telemedicine Encounter    I conducted this encounter from [facility] via secure, live, face-to-face video conference with the patient. Wendy [name]  was located at home with  Her daughter.  Prior to the interview, the risks and benefits of telemedicine were discussed with the patient and verbal consent was obtained.        SENIOR CARE CLINIC INITIAL VISIT NOTE    ID/CC:  73 year old community dwelling woman with hypertension, hyperparathyroidism, nephrolithiasis, osteoporosis, nonfunctioning right kidney being seen for establishing care, anxiety, and medication refill:     Interval History:  Baseline lives with daughter at daughters home.  Accompanied today by daughter. Transportation via at home.  Interpreter or not: none     Concerns today:     Establish care   Moved from [place] in the end of October to be close to her children after her hypercalcemia diagnosis and kidney complication.    Anxiety   Anxious about being in new place.  She moved from [place] the end of October and being in the city like [place] makes her anxious.  She is not driving because of this at this moment.  Patient also mentioned her experience after the hypercalcemia complication also contributed to her anxiety.  Patient mentioned the  reason is that her hypercalcemia was misdiagnosed and that affected her breast of the system.  She mentioned this has affected her sleep and giving her PTSD experience even though she was not diagnosed with that.  Stated that she has family history of mental health issues.  She requested to be referred to counselor.    Difficulty walking  Can walk only a block and back. Gets winded easily. Also having stiffness on ankle and knees.  Patient wants to improve her walking. Currently she is moving around at home and works on the garden with her son-in-law.     Blood pressure   Wants refill for metoprolol 50 mg and amlodipine 10 mg. 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 Last eye visit was 2018.  She wants to have an eye doctor here.      Review of Systems:  None other than mentioned above      Geriatric ROS:  ADLs: independent   IADLS: independent and can be better   Cognition: no concern  Mood: anxiety(anxious about moving to a big city,   Mobility: limitations due to ankle and knee stiffness  Vision: wearing glasses   Hearing:  wax build up   Continence: none   Goals of Care: improve mobility and general health so that can be more active. Planning to do Thi chi    Frailty index/CFS-9: 4    Problem List/Past Medical/ Surgical History:  Patient Active Problem List    Diagnosis Date Noted   . 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 06/08/2019   . Hyperparathyroidism ([place]) 06/01/2019   . Osteoporosis with current pathological fracture 06/01/2019   . Nephrolithiasis 04/27/2019      parathyroid removed 2017   Hypercalcemia   Kidney stone   One kidney non-functioning      Outpatient Medication List:  Current Outpatient Medications   Medication Sig Dispense Refill   . amLODIPine 10 MG tablet      . metoprolol succinate ER 50 MG 24 hr tablet        No current facility-administered medications for this  visit.         Over the Counter/Other Supplements: None       Pharmacy:     Allergies: Review of patient's allergies indicates:  Allergies   Allergen Reactions   . Lidocaine      Other reaction(s): Other (See Comments), Other (See Comments)  Hallucination  hallucinations  Hallucinations     . Coconut Fatty Acids      Other reaction(s): Other (See Comments)  Reaction??   . Statins Headache     Other reaction(s): Other (See Comments), Other (See Comments)  HBP and Headaches  Headaches     . Latex Itching   . Losartan Other     Other reaction(s): Other (See Comments), Unknown  headaches  Pt doesn't remember.  HBP, Headache     . Penicillins Hives and Itching      Reaction in [place] (??)  Has patient had a PCN reaction causing immediate rash, facial/tongue/throat swelling, SOB or lightheadedness with hypotension: Yes  Has patient had a PCN reaction causing severe rash involving mucus membranes or skin necrosis: No  Has patient had a PCN reaction that required hospitalization: No  Has patient had a PCN reaction occurring within the last 10 years: No  If all of the above answers are "NO", then may proceed with Cephalosporin use.          Family History: Father died at 31 from heart disease (unsure if MI or CHF, was in the 62's); mother lived into 75's , hypertension; Brother had heart surgery, but otherwise doing well; 2 children, no problems yet       Social History:  Lives: With her daughter  Marital Status: divorced   Sexual History: Not active  Children: a daughter and son  Substance Use: none  Firearms: no  Nutrition: craving for chocolate. Consuming too much sweet. Working on it   Education/Occupation: Economist. Bachelors on [place]: good to excellent   DPOA/LNOK/main contact person/main contact #s: Dione Booze, [phone]     Physical Exam:  There were no vitals taken for this visit.  Wt Readings from Last 3 Encounters:   06/01/19 206 lb (93.4 kg)   04/27/19 (P) 209 lb (94.8 kg)     No in person physical exam was performed. The following objective assessment was done during conversation with the patient.     General:  Alert?  Yes  Well-presenting?  Yes  In no acute distress?  Yes    Respiratory:  Speaking in full sentences comfortably?  Yes  Normal work of breathing?  Yes  Cough during visit?  No  Audible wheezing during visit?  No    Neuro/Psych:  Alert and Oriented to person, place, and time?  Yes  Appropriate mood and affect?  Yes  Appropriate judgment and insight demonstrated?  Yes N/A       Assessment and Plan:    Wendy [name] is a 73 y.o. woman with CAD, aortic stenosis, HTN, thyroid disease, hyperparathyroidism s/p  parathyroidectomy, Nephrolithiasis, osteoporosis, depression, bipolar disorder without psychotic feature, and generalized anxiety. Had telemed visit today to establish care, anxiety, med refill, and difficulty walking      Generalized anxiety disorder  Patient has a history of depression, bipolar disorder without psychosis, and GAD. She is anxious due to change in her environment and living situation. Moved from [place]. She found the traffic and city living overwhelming which makes her feel anxious. She moved to [facility]  to get closer to her daughter and son. She is living with daughter renting a room from her daughter. Pt requested to talk to a counselor about her anxiety.   -     REFERRAL TO PSYCHIATRY    Essential hypertension  No measurement for today's visit. Has complex cardiac history. Needs to be seen in person with future cardiology referral.   -     metoprolol succinate ER 50 MG 24 hr tablet; Take 1 tablet (50 mg) by mouth daily.  -     amLODIPine 10 MG tablet; Take 1 tablet (10 mg) by mouth daily.    Difficulty walking  Per pt report this can be due to cardiac and MSK problems or resulted from deconditioning. She needs in person evaluation soon with PCP. In the mean time I referred her to PT for stiff knee and ankle.   Wendy [name] was seen today for follow-up .  -     REFERRAL TO PHYSICAL THERAPY    Thyroid disease  -     TSH with Reflexive Free T4      Vision problem   Not discussed in detail today. To be addressed in future visit.   Labs:none   Immunizations: flu shot   Supplements:  DEXA: osteopenia from 3 yrs ago, per pt report   AAA screening: Not indicated  Colonoscopy: normal 4 yrs ago per pt report   Mammogram: normal 4 yrs ago per patient report  #POLST: Not discussed today  #RTC in: with PCP           Total Time (on same day) spent on chart review, face to face medical discussion, after visit coordination of care: 60 minutes    Time Based LOS:   Established pt: [id number], [place], [place], [id number] with  GT modifier  New pt: [id number], [id number], [id number], [phone] with GT modifier    I spent a total time of 60 minutes face-to-face with the patient, of which more than 50% was spent counseling and coordinating care as outlined in this note.    [place], [facility]   [phone]
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
## 2019-06-10 MED ORDER — METOPROLOL SUCCINATE ER 50 MG OR TB24
50.0000 mg | EXTENDED_RELEASE_TABLET | Freq: Every day | ORAL | 1 refills | Status: DC
Start: 2019-06-10 — End: 2019-08-26

## 2019-06-10 MED ORDER — AMLODIPINE BESYLATE 10 MG OR TABS
10.0000 mg | ORAL_TABLET | Freq: Every day | ORAL | 1 refills | Status: DC
Start: 2019-06-10 — End: 2019-08-26

## 2019-06-15 ENCOUNTER — Telehealth (INDEPENDENT_AMBULATORY_CARE_PROVIDER_SITE_OTHER): Payer: Self-pay | Admitting: Nephrology

## 2019-06-15 NOTE — Telephone Encounter (Signed)
Called and LVM for pt to schedule a NEW Stone Prevention visit with Dr.Cormack. Provided the [facility] phone number for a return call.

## 2019-06-16 ENCOUNTER — Encounter (INDEPENDENT_AMBULATORY_CARE_PROVIDER_SITE_OTHER): Payer: Self-pay | Admitting: Urology

## 2019-06-17 ENCOUNTER — Encounter (HOSPITAL_BASED_OUTPATIENT_CLINIC_OR_DEPARTMENT_OTHER): Payer: Self-pay | Admitting: Internal Medicine

## 2019-06-18 NOTE — Telephone Encounter (Signed)
Seen once at [facility] by [facility]. I have never seen Ms. Langwell, so am not sure how she was able to send an eCare message to me. No future appointments @ [facility] at this time.

## 2019-06-19 ENCOUNTER — Ambulatory Visit: Payer: Medicare HMO

## 2019-06-23 ENCOUNTER — Telehealth (INDEPENDENT_AMBULATORY_CARE_PROVIDER_SITE_OTHER): Payer: Self-pay | Admitting: Nursing

## 2019-06-23 ENCOUNTER — Ambulatory Visit (HOSPITAL_BASED_OUTPATIENT_CLINIC_OR_DEPARTMENT_OTHER)
Admit: 2019-06-23 | Discharge: 2019-06-23 | Disposition: A | Discharge status: Home / Self Care | Payer: Medicare HMO | Attending: Adult Health | Admitting: Adult Health

## 2019-06-23 ENCOUNTER — Other Ambulatory Visit
Admit: 2019-06-23 | Discharge: 2019-06-23 | Disposition: A | Discharge status: Home / Self Care | Payer: Medicare HMO | Attending: Urology | Admitting: Urology

## 2019-06-23 DIAGNOSIS — Z01812 Encounter for preprocedural laboratory examination: Secondary | ICD-10-CM

## 2019-06-23 DIAGNOSIS — N200 Calculus of kidney: Secondary | ICD-10-CM

## 2019-06-23 DIAGNOSIS — E079 Disorder of thyroid, unspecified: Secondary | ICD-10-CM | POA: Insufficient documentation

## 2019-06-23 LAB — REFLEX CULTURE FOR UA

## 2019-06-23 LAB — URINALYSIS WITH REFLEX CULTURE
Bilirubin (Qual), URN: NEGATIVE
Epith Cells_Renal/Trans,URN: NEGATIVE /HPF
Glucose Qual, URN: NEGATIVE mg/dL
Ketones, URN: NEGATIVE mg/dL
Leukocyte Esterase, URN: POSITIVE — AB
Nitrite, URN: NEGATIVE
Specific Gravity, URN: 1.016 g/mL (ref 1.006–1.027)
pH, URN: 7.5 (ref 5.0–8.0)

## 2019-06-23 LAB — TSH WITH REFLEXIVE FREE T4: TSH with Reflexive Free T4: 2.967 u[IU]/mL (ref 0.400–5.000)

## 2019-06-23 NOTE — Telephone Encounter (Signed)
Left detailed VM asking for pre op UA to be completed either today or tomorrow at [facility] labs.    OR date 1/20  Covid on 1/19  Anesthesia call on 1/8 completed

## 2019-06-23 NOTE — Telephone Encounter (Signed)
Spoke with pt to discuss upcoming LEFT URS LL on 1/20 with Sweet, MD    Allergies:  Lidocaine, Coconut fatty acids, Statins, Latex, Losartan, and Penicillins      Discussed the following:    -not taking any anticoagulants  -UA - tomorrow at [place] on 1/19 at [facility]  -RN anesthesia call on 1/8  -aware check in at surgical services at [place]  -aware same day surgery and to have ride for discharge pick up  - did antibacterial soap instructions:  2 showers -night before and morning of surgery- use from neck down and to avoid face/eyes- has not received packet yet  -aware of limited visitation policy- one person with masks, no symptoms, can accompany during check in for surgery

## 2019-06-24 ENCOUNTER — Encounter (HOSPITAL_BASED_OUTPATIENT_CLINIC_OR_DEPARTMENT_OTHER): Payer: Self-pay | Admitting: Internal Medicine

## 2019-06-24 ENCOUNTER — Ambulatory Visit (HOSPITAL_BASED_OUTPATIENT_CLINIC_OR_DEPARTMENT_OTHER): Payer: Medicare HMO | Attending: Adult Health | Admitting: Adult Health

## 2019-06-24 VITALS — BP 173/83 | HR 70 | Temp 95.5°F | Wt 212.5 lb

## 2019-06-24 DIAGNOSIS — H9201 Otalgia, right ear: Secondary | ICD-10-CM

## 2019-06-24 DIAGNOSIS — H60501 Unspecified acute noninfective otitis externa, right ear: Secondary | ICD-10-CM | POA: Insufficient documentation

## 2019-06-24 MED ORDER — CIPROFLOXACIN-DEXAMETHASONE 0.3-0.1 % OT SUSP
4.0000 [drp] | Freq: Two times a day (BID) | OTIC | 0 refills | Status: AC
Start: 2019-06-24 — End: 2019-07-01

## 2019-06-24 MED ORDER — CIPROFLOXACIN-DEXAMETHASONE 0.3-0.1 % OT SUSP
4.0000 [drp] | Freq: Two times a day (BID) | OTIC | 0 refills | Status: DC
Start: 2019-06-24 — End: 2019-06-24

## 2019-06-24 NOTE — Progress Notes (Signed)
SCC VISIT NOTE     SUBJECTIVE     - started with jaw pain and now ear ache since December. Ear ache is sharp pain which became noticeable about a week ago. No difference with hearing. Denies fever, chills, nausea, and discharge from ear. No recent cough and runny nose. Had a history of sinus problem.     OBJECTIVE   Vitals:    06/24/19 1339   Temp: 95.5 F (35.3 C)   Pulse: 70   BP: (!) 173/83   SpO2: 96%   Weight: 212 lb 8 oz (96.4 kg)     GEN: well appearing woman in NAD  CARD: appears well perfused with inspection   PULM: normal WOB with room air  EAR: right ear with cerumen impaction. R ear canal appear inflamed and tender with pulling pinna. TM clear and without fluid accumulation   NEURO: AOx3 with normal and appropriate speech  PSYCH: normal affect, mood and judgement       A/P     Wendy [name] is a 74 year old  woman with hypertension, hyperparathyroidism, nephrolithiasis, osteoporosis, nonfunctioning right kidney being seen for earache.     Right ear pain  -     REMOVAL IMPACTED CERUMEN IRRIGATION/LVG UNILAT    Acute otitis externa of right ear, unspecified type  -     Discontinue: ciprofloxacin-dexamethasone (Ciprodex) 0.3-0.1 % otic suspension; Place 4 drops into right ear 2 times a day for 7 days.  -     ciprofloxacin-dexamethasone (Ciprodex) 0.3-0.1 % otic suspension; Place 4 drops into right ear 2 times a day for 7 days.      Follow up (message Phuong)  [place], [facility]   [phone]

## 2019-06-24 NOTE — Telephone Encounter (Signed)
Outgoing Call: RN Triage    - Call placed to patient from e-care message received from patient.    - Nex Appointment - Today 06/24/2019 1:30PM In-Person with Seme ARNP.     Patient Reported Symptoms:    1. Ear Ache - Right Ear    - Onset: "Aching for a while, but last night with pressure changes, it gets really bad."     - Location: "Pain radiates down laterally towards jaw area".     - Pain Level & description - "Like a stabbing pain 7/10". "For the discomfort in my jaw area it's like a dull constant discomfort 5/10" "While lying down on it, seems to make it worse."    - Ringing or Loss of Hearing - None    - Hx of trauma to ear? - None, "I do have constant sinus issue that is ongoing, but no recent trauma".     - Febrile? Unable to obtain, upon palpation doesn't seem warmer than usual, however waking up she reported feeling sweaty.    - Occasional Cough, from hx GERD per patient    - Drainage? None    Recommendation:    - Continue to monitor symptoms & check for symptoms of infection.    - Please report any worsening symptoms.    - For emergency symptoms such as trouble breathing, please got to ED.    - Keep appointment with [facility] today to be seen in clinic.    - Route message to provider / care team.

## 2019-06-24 NOTE — Telephone Encounter (Signed)
Agree with appointment as scheduled. Note I have never seen Ms. Wicker, so am not sure how she is sending eCare messages to me. (Seen 12/28 telemedicine by ARNP Te[place]ne.)

## 2019-06-25 ENCOUNTER — Telehealth (INDEPENDENT_AMBULATORY_CARE_PROVIDER_SITE_OTHER): Payer: Self-pay | Admitting: Urology

## 2019-06-25 ENCOUNTER — Encounter (INDEPENDENT_AMBULATORY_CARE_PROVIDER_SITE_OTHER): Payer: Self-pay | Admitting: Urology

## 2019-06-25 DIAGNOSIS — N200 Calculus of kidney: Secondary | ICD-10-CM

## 2019-06-25 DIAGNOSIS — N390 Urinary tract infection, site not specified: Secondary | ICD-10-CM

## 2019-06-25 LAB — URINE C/S: Culture: >100000 — AB

## 2019-06-25 MED ORDER — SULFAMETHOXAZOLE-TRIMETHOPRIM 800-160 MG OR TABS
1.0000 | ORAL_TABLET | Freq: Two times a day (BID) | ORAL | 0 refills | Status: DC
Start: 2019-06-25 — End: 2019-07-16

## 2019-06-25 MED ORDER — SULFAMETHOXAZOLE-TRIMETHOPRIM 800-160 MG OR TABS
1.0000 | ORAL_TABLET | Freq: Two times a day (BID) | ORAL | 0 refills | Status: DC
Start: 2019-06-25 — End: 2019-06-25

## 2019-06-25 NOTE — Telephone Encounter (Signed)
Spoke with pt and updated that she will need to have antibiotics x 3 days leading up to surgery on 1/20    Plan for Bactrim x 3 days to start on 1/17- verbalized understanding.  Will need script sent to a new pharmacy as well.

## 2019-06-25 NOTE — Telephone Encounter (Signed)
Ucx: >100,000 Col/mL   Mixed flora      Bactrim 3 days prior per Danise Mina

## 2019-06-25 NOTE — Telephone Encounter (Signed)
I re-sent bactrim DS to preferred pharm.

## 2019-06-25 NOTE — Telephone Encounter (Signed)
Patient called the [facility] wanting to update Dr. Billey Chang nursing staff about where to send their prescription to.    Patient's preferred pharmacy location is the [place] location:  [address] [place] [address]  Pharmacy Ph: [phone].    Patient can be called back at [phone] to confirm prescription has been sent.

## 2019-06-25 NOTE — Telephone Encounter (Signed)
Bactrim DS BID 3 days prior to OR date sent to her pharmacy.

## 2019-06-25 NOTE — Telephone Encounter (Signed)
Updated pt that Bactrim order was resent to [facility] and aware to start x 3 days pre op on Sunday 1/17.

## 2019-06-26 ENCOUNTER — Encounter (INDEPENDENT_AMBULATORY_CARE_PROVIDER_SITE_OTHER): Payer: Self-pay | Admitting: Urology

## 2019-06-30 ENCOUNTER — Encounter (INDEPENDENT_AMBULATORY_CARE_PROVIDER_SITE_OTHER): Payer: Self-pay | Admitting: Urology

## 2019-06-30 ENCOUNTER — Ambulatory Visit (HOSPITAL_BASED_OUTPATIENT_CLINIC_OR_DEPARTMENT_OTHER): Payer: Medicare HMO | Attending: Internal Medicine

## 2019-06-30 DIAGNOSIS — Z01811 Encounter for preprocedural respiratory examination: Secondary | ICD-10-CM

## 2019-06-30 DIAGNOSIS — Z01812 Encounter for preprocedural laboratory examination: Secondary | ICD-10-CM | POA: Insufficient documentation

## 2019-06-30 DIAGNOSIS — Z20822 Contact with and (suspected) exposure to covid-19: Secondary | ICD-10-CM | POA: Insufficient documentation

## 2019-06-30 NOTE — Progress Notes (Signed)
Patient was seen on 06/30/2019 at the [facility] drive up site where a sample of dual nasal pharyngeal collection was taken. The specimen was sent to the [facility] lab for COVID-19 testing.  Patient will be informed of test results within 48 hours.  Patient received informational instructions on self-care.    The specimen was collected by: TS

## 2019-07-01 ENCOUNTER — Encounter (INDEPENDENT_AMBULATORY_CARE_PROVIDER_SITE_OTHER): Payer: Self-pay | Admitting: Urology

## 2019-07-01 ENCOUNTER — Other Ambulatory Visit: Payer: Self-pay | Admitting: Urology

## 2019-07-01 ENCOUNTER — Telehealth (INDEPENDENT_AMBULATORY_CARE_PROVIDER_SITE_OTHER): Payer: Self-pay | Admitting: Urology

## 2019-07-01 ENCOUNTER — Ambulatory Visit
Admission: RE | Admit: 2019-07-01 | Discharge: 2019-07-01 | Disposition: A | Discharge status: Home / Self Care | Payer: Medicare HMO | Attending: Urology | Admitting: Urology

## 2019-07-01 ENCOUNTER — Ambulatory Visit (HOSPITAL_COMMUNITY): Payer: Self-pay | Admitting: Urology

## 2019-07-01 DIAGNOSIS — Z6841 Body Mass Index (BMI) 40.0 and over, adult: Secondary | ICD-10-CM | POA: Insufficient documentation

## 2019-07-01 DIAGNOSIS — Z951 Presence of aortocoronary bypass graft: Secondary | ICD-10-CM | POA: Insufficient documentation

## 2019-07-01 DIAGNOSIS — M1990 Unspecified osteoarthritis, unspecified site: Secondary | ICD-10-CM | POA: Insufficient documentation

## 2019-07-01 DIAGNOSIS — K219 Gastro-esophageal reflux disease without esophagitis: Secondary | ICD-10-CM | POA: Insufficient documentation

## 2019-07-01 DIAGNOSIS — N200 Calculus of kidney: Secondary | ICD-10-CM | POA: Insufficient documentation

## 2019-07-01 DIAGNOSIS — N202 Calculus of kidney with calculus of ureter: Secondary | ICD-10-CM

## 2019-07-01 DIAGNOSIS — I2510 Atherosclerotic heart disease of native coronary artery without angina pectoris: Secondary | ICD-10-CM | POA: Insufficient documentation

## 2019-07-01 DIAGNOSIS — I10 Essential (primary) hypertension: Secondary | ICD-10-CM | POA: Insufficient documentation

## 2019-07-01 DIAGNOSIS — Z880 Allergy status to penicillin: Secondary | ICD-10-CM | POA: Insufficient documentation

## 2019-07-01 HISTORY — PX: URETEROSCOPY AND LASER LITHOTRIPSY: SHX5206

## 2019-07-01 LAB — COVID-19 CORONAVIRUS QUALITATIVE PCR: COVID-19 Coronavirus Qual PCR Result: NOT DETECTED

## 2019-07-01 NOTE — Telephone Encounter (Signed)
Patient's son, [name], called requesting to speak with RN Team to schedule stent removal appt for pt. Patient is feeling fine post-surgery. Mickle Asper that RN Team will reach out when available for post-op plan and scheduling stent pull.

## 2019-07-01 NOTE — Telephone Encounter (Signed)
Patient's son [name]) called the [facility] wanting to let the RN staff to call their phone number tomorrow to help schedule the patient's post op/stent removal appointment.    [name] can be called tomorrow to further discuss at [phone].

## 2019-07-01 NOTE — Progress Notes (Signed)
eCare message sent for negative COVID-19 test result

## 2019-07-02 ENCOUNTER — Encounter (INDEPENDENT_AMBULATORY_CARE_PROVIDER_SITE_OTHER): Payer: Self-pay | Admitting: Urology

## 2019-07-02 NOTE — Telephone Encounter (Signed)
Pt called to firm up stent pull appointment and is scheduled for 9:40am on Monday. Pt would like a call back to further discuss post op plan at [phone].

## 2019-07-02 NOTE — Telephone Encounter (Signed)
Writer called Sandi Raveling for post-operative evaluation and scheduling for follow-up appointment(s) with [facility] provider.     Patient denies fevers, nausea/vomiting, chest pain & shortness of breath. Endorses adequate urine output with improving hematuria.     Pain is controlled with provided medications and confirms knowledge of appropriate use.Patient confirmed availability for follow-up appointment(s) and will contact [place] with any questions regarding follow up or with any concerning symptoms (Fevers, chills nausea/vomiting, inability to void, chest pain/SOB, or debilitating pain)     *Plan: STENT PULL 1/25; no f/u img or abx  *

## 2019-07-02 NOTE — Telephone Encounter (Signed)
Reviewed incentive spirometry over the phone

## 2019-07-02 NOTE — Telephone Encounter (Signed)
Scheduled for Monday  °

## 2019-07-02 NOTE — Telephone Encounter (Signed)
Spoke with [employer]. Reviewed time for stent pull. Will plan for complete as scheduled.

## 2019-07-03 ENCOUNTER — Ambulatory Visit (HOSPITAL_BASED_OUTPATIENT_CLINIC_OR_DEPARTMENT_OTHER): Payer: Medicare HMO | Attending: Infectious Disease

## 2019-07-03 ENCOUNTER — Ambulatory Visit (HOSPITAL_BASED_OUTPATIENT_CLINIC_OR_DEPARTMENT_OTHER): Payer: Self-pay

## 2019-07-03 DIAGNOSIS — Z23 Encounter for immunization: Secondary | ICD-10-CM

## 2019-07-06 ENCOUNTER — Ambulatory Visit (INDEPENDENT_AMBULATORY_CARE_PROVIDER_SITE_OTHER): Payer: Medicare HMO | Admitting: Urology

## 2019-07-06 ENCOUNTER — Encounter (INDEPENDENT_AMBULATORY_CARE_PROVIDER_SITE_OTHER): Payer: Self-pay | Admitting: Urology

## 2019-07-06 DIAGNOSIS — N200 Calculus of kidney: Secondary | ICD-10-CM

## 2019-07-06 LAB — STONE ANALYSIS: Stone Interpretation: 100

## 2019-07-06 MED ORDER — CIPROFLOXACIN HCL 250 MG OR TABS
500.0000 mg | ORAL_TABLET | Freq: Once | ORAL | Status: AC
Start: 2019-07-06 — End: 2019-07-06
  Administered 2019-07-06: 500 mg via ORAL

## 2019-07-06 NOTE — Progress Notes (Signed)
Preoperative diagnosis: indwelling left ureteral stent status post ureteroscopy holmium laser lithotripsy 68mm UPJ stone, renal stones.  Postoperative diagnosis: Indwelling left ureteral stent status post ureteroscopy holmium laser lithotripsy 63mm UPJ stone, renal stones.     Procedure: Cystoscopy and removal of left double-J ureteral stent.    Surgeon: Jani Files M.D.    Anesthesia: 2% local lidocaine per urethra allowed to remain for 5 minutes prior to instrumentation.    Findings: Normal bladder contents without debris.  Left double-J ureteral stent removed its entirety.    Complications: None    Stone analysis: pending        Description of procedure: After obtaining informed consent and timeout.  The patient's labia and urethra were prepped and draped in the usual sterile fashion.  2% lidocaine gel was instilled per urethra and allowed to remain for 5 minutes prior instrumentation.  Thorough examination of the patient's urethra and bladder was performed using an Olympus flexible ureteroscope.  The above findings were noted.  The distal curl of the stent was identified and grasped and removed in its entirety.  The patient tolerated the procedure well.    Disposition: The patient was provided with ciprofloxacin as standard prophylaxis for the stent removal.  She will notify us if she develops persistent flank pain or fevers.  She will take ibuprofen for mild flank pain.  She will follow up with [place] in 8 weeks.   In 4-6 weeks she will obtain a litho-link.

## 2019-07-06 NOTE — Patient Instructions (Signed)
Litholink 24 hour urine test and Renal Ultrasound to be completed 2 weeks prior to visit with our kidney stone Dietician and PA    If prescribed, continue flomax and ibuprofen today.  Can discontinue tomorrow.

## 2019-07-09 ENCOUNTER — Encounter (HOSPITAL_BASED_OUTPATIENT_CLINIC_OR_DEPARTMENT_OTHER): Payer: Medicare HMO | Admitting: Rehabilitative and Restorative Service Providers"

## 2019-07-13 ENCOUNTER — Encounter (HOSPITAL_BASED_OUTPATIENT_CLINIC_OR_DEPARTMENT_OTHER): Payer: Self-pay | Admitting: Internal Medicine

## 2019-07-14 NOTE — Telephone Encounter (Signed)
Spoke to pt and scheduled appt w/ Dr. Ronalee Red for this Thurs 2/4 at 11:05am.

## 2019-07-14 NOTE — Telephone Encounter (Signed)
[facility]: please offer appointment for knee pain, thanks.    Seen 12/28 and 1/13 by Cherlyn Cushing. I have not seen Ms. Mel Almond before. Recommend appointment.

## 2019-07-15 ENCOUNTER — Encounter (HOSPITAL_BASED_OUTPATIENT_CLINIC_OR_DEPARTMENT_OTHER): Payer: Self-pay | Admitting: Internal Medicine

## 2019-07-15 DIAGNOSIS — Z951 Presence of aortocoronary bypass graft: Secondary | ICD-10-CM | POA: Insufficient documentation

## 2019-07-16 ENCOUNTER — Encounter (HOSPITAL_BASED_OUTPATIENT_CLINIC_OR_DEPARTMENT_OTHER): Payer: Self-pay | Admitting: Internal Medicine

## 2019-07-16 ENCOUNTER — Ambulatory Visit (HOSPITAL_BASED_OUTPATIENT_CLINIC_OR_DEPARTMENT_OTHER): Payer: Medicare HMO | Attending: Internal Medicine | Admitting: Internal Medicine

## 2019-07-16 ENCOUNTER — Ambulatory Visit (HOSPITAL_BASED_OUTPATIENT_CLINIC_OR_DEPARTMENT_OTHER): Payer: Medicare HMO

## 2019-07-16 VITALS — BP 136/44 | HR 60 | Temp 97.5°F | Wt 211.3 lb

## 2019-07-16 DIAGNOSIS — M8000XS Age-related osteoporosis with current pathological fracture, unspecified site, sequela: Secondary | ICD-10-CM | POA: Insufficient documentation

## 2019-07-16 DIAGNOSIS — M1711 Unilateral primary osteoarthritis, right knee: Secondary | ICD-10-CM | POA: Insufficient documentation

## 2019-07-16 DIAGNOSIS — N289 Disorder of kidney and ureter, unspecified: Secondary | ICD-10-CM | POA: Insufficient documentation

## 2019-07-16 DIAGNOSIS — Z86010 Personal history of colonic polyps: Secondary | ICD-10-CM | POA: Insufficient documentation

## 2019-07-16 DIAGNOSIS — M25561 Pain in right knee: Secondary | ICD-10-CM

## 2019-07-16 DIAGNOSIS — M8588 Other specified disorders of bone density and structure, other site: Secondary | ICD-10-CM | POA: Insufficient documentation

## 2019-07-16 DIAGNOSIS — R935 Abnormal findings on diagnostic imaging of other abdominal regions, including retroperitoneum: Secondary | ICD-10-CM | POA: Insufficient documentation

## 2019-07-16 DIAGNOSIS — Z6841 Body Mass Index (BMI) 40.0 and over, adult: Secondary | ICD-10-CM | POA: Insufficient documentation

## 2019-07-16 DIAGNOSIS — I350 Nonrheumatic aortic (valve) stenosis: Secondary | ICD-10-CM | POA: Insufficient documentation

## 2019-07-16 DIAGNOSIS — I2510 Atherosclerotic heart disease of native coronary artery without angina pectoris: Secondary | ICD-10-CM | POA: Insufficient documentation

## 2019-07-16 DIAGNOSIS — M810 Age-related osteoporosis without current pathological fracture: Secondary | ICD-10-CM

## 2019-07-16 DIAGNOSIS — K635 Polyp of colon: Secondary | ICD-10-CM | POA: Insufficient documentation

## 2019-07-16 DIAGNOSIS — Z86018 Personal history of other benign neoplasm: Secondary | ICD-10-CM

## 2019-07-16 DIAGNOSIS — I351 Nonrheumatic aortic (valve) insufficiency: Secondary | ICD-10-CM | POA: Insufficient documentation

## 2019-07-16 DIAGNOSIS — I10 Essential (primary) hypertension: Secondary | ICD-10-CM | POA: Insufficient documentation

## 2019-07-16 DIAGNOSIS — Z8639 Personal history of other endocrine, nutritional and metabolic disease: Secondary | ICD-10-CM | POA: Insufficient documentation

## 2019-07-16 LAB — COMPREHENSIVE METABOLIC PANEL
ALT (GPT): 15 U/L (ref 7–33)
AST (GOT): 13 U/L (ref 9–38)
Albumin: 4.2 g/dL (ref 3.5–5.2)
Alkaline Phosphatase (Total): 69 U/L (ref 38–172)
Anion Gap: 6 (ref 4–12)
Bilirubin (Total): 0.6 mg/dL (ref 0.2–1.3)
Calcium: 9.3 mg/dL (ref 8.9–10.2)
Carbon Dioxide, Total: 31 meq/L (ref 22–32)
Chloride: 104 meq/L (ref 98–108)
Creatinine: 0.93 mg/dL (ref 0.38–1.02)
Glucose: 79 mg/dL (ref 62–125)
Potassium: 3.7 meq/L (ref 3.6–5.2)
Protein (Total): 7.1 g/dL (ref 6.0–8.2)
Sodium: 141 meq/L (ref 135–145)
Urea Nitrogen: 12 mg/dL (ref 8–21)
eGFR by CKD-EPI: >60 mL/min/{1.73_m2} (ref >59)

## 2019-07-16 LAB — LIPID PANEL
Cholesterol (LDL): 186 mg/dL — ABNORMAL HIGH (ref <130)
Cholesterol/HDL Ratio: 6.1
HDL Cholesterol: 43 mg/dL (ref >39)
Non-HDL Cholesterol: 220 mg/dL — ABNORMAL HIGH (ref 0–159)
Total Cholesterol: 263 mg/dL — ABNORMAL HIGH (ref <200)
Triglyceride: 168 mg/dL — ABNORMAL HIGH (ref <150)

## 2019-07-16 LAB — PARATHYROID HORMONE: Parathyroid Hormone: 100 pg/mL — ABNORMAL HIGH (ref 12–88)

## 2019-07-16 NOTE — Assessment & Plan Note (Signed)
Repeat TTE ~02/2020

## 2019-07-16 NOTE — Progress Notes (Addendum)
[place] SUBSEQUENT CARE VISIT NOTE     Chief Complaint   Patient presents with   . Follow-Up      Right knee pain and kidney problem issues       ID/CC:  Wendy [name] is a 74 year old community-dwelling female being seen for R knee pain. Last visit with ARNP [place] 06/24/19, Ms. Kolarik is establishing care with me.     HPI:  Accompanied today by none  Interpreter or not: NO     Concerns today: R knee pain sudden onset. No known injury or unusual activities. Painful to walk on R knee. Getting better. Hasn't tried any heat or ice.    She is followed by urology for kidney and ureteral stones. Recently s/p L ureteral stent placement. Her R kidney is non-functioning because of a large ureteral stone.    Moved from [place] due to health problems, children are here. Adjusting to new city including driving.     Needs to have colonoscopy to follow up colon polyps. Hasn't scheduled DEXA yet.    Patient Active Problem List   Diagnosis   . Nephrolithiasis   . History of primary hyperparathyroidism   . Osteoporosis without current pathological fracture   . Non-functioning R kidney   . Essential hypertension   . Aortic stenosis, mild   . Asthma   . Bipolar disorder without psychotic features ([place])   . Atherosclerosis of native coronary artery of native heart   . Colon polyps   . Depression   . Generalized anxiety disorder   . Hyperlipidemia   . Migraine   . History of R parathyroid adenoma   . History of R parathyroidectomy (2017)   . Thyroid disease   . Vitamin D deficiency   . Hx of CABG   . Aortic regurgitation   . Pulmonary nodule less than 6 cm determined by computed tomography of lung   . Abnormal computed tomography of pelvis   . Body mass index 40.0-44.9, adult ([place])   . History of R metatarsal fractures      Past medical history, past surgical history reviewed     Outpatient Medications Marked as Taking for the 07/16/19 encounter (Office Visit) with Debroah Loop, MD   Medication Sig Dispense  Refill   . amLODIPine 10 MG tablet Take 1 tablet (10 mg) by mouth daily. 30 tablet 1   . metoprolol succinate ER 50 MG 24 hr tablet Take 1 tablet (50 mg) by mouth daily. 30 tablet 1         Review of patient's allergies indicates:  Allergies   Allergen Reactions   . Lidocaine Other     Other reaction(s): Other (See Comments), Other (See Comments)  Hallucinations   . Penicillins Hives     Reaction in [place] (??)  Has patient had a PCN reaction causing immediate rash, facial/tongue/throat swelling, SOB or lightheadedness with hypotension: Yes  Has patient had a PCN reaction causing severe rash involving mucus membranes or skin necrosis: No  Has patient had a PCN reaction that required hospitalization: No  Has patient had a PCN reaction occurring within the last 10 years: No  If all of the above answers are "NO", then may proceed with Cephalosporin use.     . Coconut Fatty Acids      Other reaction(s): Other (See Comments)  Reaction??   . Latex Itching   . Losartan Headache and Other     Other reaction(s):  Other (See Comments), Unknown  Pt doesn't remember.  High blood pressure   . Statins Headache and Other     Other reaction(s): Other (See Comments), Other (See Comments) high blood pressure     Family history, social history reviewed    Geriatric ROS: see 06/08/2019 note by Cherlyn Cushing  CFS-9: 4    Social History:  DPOA/Legal Next of Kin/main contact person/main contact #s: daughter Serene    Immunization History   Administered Date(s) Administered   . COVID-19 Moderna mRNA LNP-S PF 07/03/2019   . Influenza quadrivalent MDCK PF (Flucelvax) 04/18/2019   . Influenza quadrivalent PF 07/11/2017   . Influenza trivalent high-dose 03/04/2018   . Pneumococcal conjugate PCV13 (Prevnar 13) 05/21/2014   . Pneumococcal polysaccharide PPSV23 (Pneumovax 23) 11/14/2012   . Tdap vaccine 11/14/2012       PHYSICAL EXAMINATION:  BP 136/44   Pulse 60   Temp 97.5 F (36.4 C)   Wt 211 lb 4.8 oz (95.8 kg)   SpO2 96%   BMI  41.27 kg/m    Wt Readings from Last 3 Encounters:   07/16/19 211 lb 4.8 oz (95.8 kg)   06/24/19 212 lb 8 oz (96.4 kg)   06/01/19 206 lb (93.4 kg)       GENERAL: pleasant older Caucasian female BMI 41. Appearance: well-groomed, stated age, in no acute distress.  CV: regular rate and rhythm, normal S1, S2, 4/6 holosystolic murmur, no WEXH/B7J6  LUNGS: unlabored breathing, clear to auscultation bilaterally  MSK: R knee mild effusion, no erythema or warmth to touch. Tender to palpation medially and proximal to patella. Full range of motion.  Gait: antalgic     Labs/Other Results:  Telemedicine on 06/08/2019   Component Date Value   . TSH with Reflexive Free * 06/23/2019 2.967       03/04/18 vitamin B-12 377 ([facility] in [facility] Everywhere)  25-OH vitamin D 22.68    ASSESSMENT/PLAN:  1. Acute pain of right knee: this week, improving. Consider osteoarthritis, knee strain.  - XR Knee 4+ View Right; Future will follow up with result on eCare  - Cold compresses/ice as needed for swelling and pain    2. Non-functioning R kidney: due to ureteral stone  - Comprehensive Metabolic Panel    3. Essential hypertension: mildly elevated today on amlodipine and metoprolol. Need to maintain good BP control due to single functioning L kidney.  - Continue amlodipine and metoprolol  - Comprehensive Metabolic Panel  - Lipid Panel  - REFERRAL TO PHARMACY for clinical pharmacist co-management ordered after appointment     4. Osteoporosis without current pathological fracture, unspecified osteoporosis type: prior history of R metatarsal fractures 2017  - Seen by endocrine 05/2019  - Check with radiology today if DEXA ordered by endocrine can be done or can schedule   - Comprehensive Metabolic Panel  - Parathyroid Hormone  - Vitamin D (25 Hydroxy)    5. Atherosclerosis of native coronary artery of native heart without angina pectoris: on metoprolol, dose not tolerate statins per outside records  - Lipid Panel    6. Aortic stenosis, mild: last  TTE 02/2018 in [place]  7. Aortic valve insufficiency, etiology of cardiac valve disease unspecified: mild-moderate on 02/2018 TTE  - Plan TTE around 02/2020    8. History of primary hyperparathyroidism: s/p subtotal R parathyroidectomy 2017 in [place]  - Parathyroid Hormone    9. 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 History of colon polyps: plan colonoscopy later this year for follow up surveillance, defer due to COVID-19 pandemic    11. Abnormal computed tomography of pelvis: outside imaging 01/09/2019 per Care Everywhere probable fibroid consider pelvic ultrasound in future to confirm    Relevant Health Care Maintenance:  - Labs: lipids today; BMP today; TSH normal 06/23/19; 25-OH vitamin D 22.68 03/04/18 ([facility]); vitamin B-12 normal 03/04/18 ([facility])  - Supplements: none  - Immunizations: pending COVID-19 vaccine dose #2 this month  - Osteoporosis screening (DEXA): indicated N/A known osteoporosis; DEXA scan due and previously ordered by endocrine as above  - Abdominal aortic aneurysm screening: indicated NO  - Colon cancer screening: indicated YES colon polyp surveillance this year, no outside records available (defer due to COVID-19 pandemic  - Mammogram/breast cancer screening: indicated YES will order for later this year (defer due to COVID-19 pandemic)    Advance care planning/POLST: address at future appointment     Return in about 3 months (around 10/13/2019) for routine follow-up, sooner if needed for other questions or concerns.     I spent a total of 67 minutes for the patient's care on the date of the service.       ADDENDUM 07/20/19   Orders Only on 07/16/19   1. Dexa, Axial Skeleton    Narrative    EXAMINATION:  DEXA, AXIAL SKELETON: 07/16/2019     INDICATION:  74 year old postmenopausal female, hyperparathyroidism.  TECHNIQUE:  Bone mineral densitometry of the  lumbar spine (L1-L4) and left hip was   performed with [employer].     Imaging Site: [facility]     COMPARISON:  None available.     FINDINGS:  BONE MINERAL DENSITY DETERMINATION:     Technical Limitations: None.     Lumbar spine (L1-L4)  Average bone mineral density: 0.786 g/cm2  T-score: -2.4 (75% young normal values)  Z-score: -0.1 (98% age-matched normal values)     Left total hip  Average bone mineral density: 0.823 g/cm2  T-score: -1.0 (87% young normal values)  Z-score: 0.7 (111% age-matched normal values)     Left femoral neck  Average bone mineral density: 0.633 g/cm2   T-score: -1.9 (75% young normal values)   Z-score: 0.0 (100% age-matched normal values)   Left 33% radius  Average bone mineral density: 0.597 g/cm2  T-score: -1.6 (86% young normal values)  Z-score: 0.7 (107% age-matched normal values)     The 10-year probability of a major osteoporotic fracture as determined by the   FRAX algorithm (based upon the femoral neck BMD measurement and secondary   osteoporosis) is 11% %; the 10-year probability of hip fracture is 2.1%%.     Trabecular bone score (TBS) for L1-L4: 1.133  This suggests poor bone quality.  The TBS-adjusted FRAX results in a 10-year major osteoporotic fracture risk of   13.1% and a 10-year hip fracture risk of 2.7%.     ATTENDING RADIOLOGIST AND PAGER NUMBER  [id number] Nadara Mustard MD  [phone]    Impression    IMPRESSION:  Osteopenia with elevated risk of osteoporosis-related fracture.     FRAX was calculated: The 10-year probability of a major osteoporotic fracture   as determined by the FRAX algorithm (based upon the femoral neck BMD   measurement) is 11% %; the 10-year probability of hip fracture is 2.1%%.      (Based upon the [facility] international reference  standard for osteoporosis and the   2019 official position of the International Society for Clinical Densitometry   (ISCD): Osteoporosis is defined as a T-score of -2.5 or lower at the lumbar   spine, total hip, femoral  neck, or 33% radius in postmenopausal females and   males age 15 and older. A T-score of -1.0 or higher is defined as normal bone   mineral density. A T-score between -1.0 and -2.5 is considered to be low bone   mineral density or osteopenia. Severe osteoporosis connotes a T-score of -2.5   or below with fragility fracture.)    Orders Only on 07/16/19   2. XR Knee 4+ View Right    Narrative    EXAMINATION:  XR KNEE 4 MIN VIEWS RIGHT     CLINICAL INDICATION:  R knee pain sudden onset a couple days ago, now better      COMPARISON:   No prior images are avilable for comparison.     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  [Doubtful joint space narrowing and possible osteophytic lipping.   (Kellgren-Lawrence grade 1)]        There is small suprapatellar effusion. There is no acute fracture or   dislocation.  ATTENDING RADIOLOGIST AND PAGER NUMBER  [phone] Clare Charon  MD  [phone]   Office Visit on 07/16/19   3. Comprehensive Metabolic Panel   Result Value Ref Range    Sodium 141 135 - 145 meq/L    Potassium 3.7 3.6 - 5.2 meq/L    Chloride 104 98 - 108 meq/L    Carbon Dioxide, Total 31 22 - 32 meq/L    Anion Gap 6 4 - 12    Glucose 79 62 - 125 mg/dL    Urea Nitrogen 12 8 - 21 mg/dL    Creatinine 0.93 0.38 - 1.02 mg/dL    Protein (Total) 7.1 6.0 - 8.2 g/dL    Albumin 4.2 3.5 - 5.2 g/dL    Bilirubin (Total) 0.6 0.2 - 1.3 mg/dL    Calcium 9.3 8.9 - 10.2 mg/dL    AST (GOT) 13 9 - 38 U/L    Alkaline Phosphatase (Total) 69 38 - 172 U/L    ALT (GPT) 15 7 - 33 U/L    eGFR, Calculated >60 >59 mL/min/[1.73_m2]    GFR, Information       Calculated GFR by CKD-EPI equation. Inaccurate with changing renal function. See http://depts.[employer].fr.html.   4. Parathyroid Hormone   Result Value Ref Range    Parathyroid Hormone 100 (H) 12 - 88 pg/mL   5. Vitamin D (25 Hydroxy)   Result Value Ref Range    Vitamin D2 (25_Hydroxy) 4.2 ng/mL    Vitamin D3 (25_Hydroxy) 24.7 ng/mL    Vit D (25_Hydroxy)  Total 28.9 20.1 - 50.0 ng/mL    Vit D (25_Hydroxy) Interp Normal:            20.1-50.0 ng/mL    6. Lipid Panel   Result Value Ref Range    Cholesterol (Total) 263 (H) <200 mg/dL    Triglyceride 168 (H) <150 mg/dL    Cholesterol (HDL) 43 >39 mg/dL    Cholesterol (LDL) 186 (H) <130 mg/dL    Non-HDL Cholesterol 220 (H) 0 - 159 mg/dL    Cholesterol/HDL Ratio 6.1     Lipid Panel, Additional Info. (NOTE)      Hyperparathyroidism noted again with normocalcemia and normal 25-OH vitamin D. DEXA shows osteopenia, known osteoporosis with history  of fragility fracture. Request endocrine follow up.    Mixed hyperlipidemia: offered referral to lipid clinic, intolerant to statins.    eCare response sent 07/20/19.

## 2019-07-16 NOTE — Assessment & Plan Note (Signed)
Colonoscopy due 2021 (defer due to COVID-19 pandemic)

## 2019-07-16 NOTE — Patient Instructions (Signed)
Thank you for choosing [facility] for your care. You saw Ether Griffins, MD, MPH today at [facility]. Please call [phone] option #2 with any urgent questions or concerns.    Stop by front desk to schedule follow up appointment with me for about 3 months, sooner if needed    Right knee pain: stop by x-ray on 1st floor today  - Cold compresses as needed   - I will follow up with you on eCare about results tomorrow and any other next steps  - They can also help you schedule your bone density test    Then please stop by lab on ground floor for lab tests. Will have most lab tests back by tomorrow but vitamin D test may take a couple more days. Will follow up with you on eCare.    Other health follow up items:  - Will plan for echocardiogram ultrasound heart in fall 2021 for heart murmur follow up   - Will plan for colonoscopy later 2021

## 2019-07-16 NOTE — Assessment & Plan Note (Signed)
Referral for clinical pharmacist co-management

## 2019-07-18 LAB — VITAMIN D (25 HYDROXY)
Vit D (25_Hydroxy) Total: 28.9 ng/mL (ref 20.1–50.0)
Vitamin D2 (25_Hydroxy): 4.2 ng/mL
Vitamin D3 (25_Hydroxy): 24.7 ng/mL

## 2019-07-20 ENCOUNTER — Ambulatory Visit: Payer: Medicare HMO | Attending: "Endocrinology | Admitting: Registered"

## 2019-07-20 ENCOUNTER — Encounter (HOSPITAL_BASED_OUTPATIENT_CLINIC_OR_DEPARTMENT_OTHER): Payer: Self-pay | Admitting: Internal Medicine

## 2019-07-20 DIAGNOSIS — E782 Mixed hyperlipidemia: Secondary | ICD-10-CM

## 2019-07-20 DIAGNOSIS — N200 Calculus of kidney: Secondary | ICD-10-CM | POA: Insufficient documentation

## 2019-07-20 DIAGNOSIS — E785 Hyperlipidemia, unspecified: Secondary | ICD-10-CM

## 2019-07-20 NOTE — Assessment & Plan Note (Signed)
Offer lipid clinic referral

## 2019-07-20 NOTE — Progress Notes (Signed)
[facility] Telemedicine Encounter    I conducted this encounter from [place] via secure, live, face-to-face video conference with the patient. Wendy [name] was located at home on her own.  Prior to the interview, the risks and benefits of telemedicine were discussed with the patient and verbal consent was obtained.     Nutrition Clinic     Note type: New  Chief Complaint   Patient presents with   . Kidney Problem   . Weight Problem   . Hyperlipidemia     Direct time with patient: 75 mins   Interpreter used: NO    Assessment  (N20.0) Nephrolithiasis  (primary encounter diagnosis)  (E78.5) Hyperlipidemia, unspecified hyperlipidemia type  (E66.01) Obesity, morbid, BMI 40.0-49.9 (HCC)    Client History:  Information from patient:  Patient recently moved to [place] to live with her daughter.  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  Has calcium oxalate kidney stones.  [facility] on recipes for kidneys.  Also has issues with GERD and hyperlipidemia.    Social History     Tobacco Use   . Smoking status: Never Smoker   . Smokeless tobacco: Never Used   Substance Use Topics   . Alcohol use: Not Currently     Family History     Problem (# of Occurrences) Relation (Name,Age of Onset)    Ankylosing Spondylitis (1) Maternal Uncle    Arthritis (1) Maternal Grandmother    Bipolar Disorder (2) Mother: Per outside records, Brother: Per outside records    Brain Cancer (1) Sister    Breast Cancer (1) Paternal Aunt    Hypertension (2) Sister: Per outside records, Mother    Myocardial Infarction (1) Father (43)       Negative family history of: Osteoporosis, Stroke          Anthropometric Measurements:   There were no vitals taken for this visit.  There is no height or weight on file to calculate BMI.  Wt Readings from Last 5 Encounters:   07/16/19 211 lb 4.8 oz (95.8 kg)   06/24/19 212 lb 8 oz (96.4 kg)   06/01/19 206 lb (93.4 kg)   04/27/19 (P) 209 lb (94.8 kg)        Biochemical Data, Medical Tests and  Procedures:    Results for orders placed or performed in visit on 07/16/19   Comprehensive Metabolic Panel   Result Value Ref Range    Sodium 141 135 - 145 meq/L    Potassium 3.7 3.6 - 5.2 meq/L    Chloride 104 98 - 108 meq/L    Carbon Dioxide, Total 31 22 - 32 meq/L    Anion Gap 6 4 - 12    Glucose 79 62 - 125 mg/dL    Urea Nitrogen 12 8 - 21 mg/dL    Creatinine 0.93 0.38 - 1.02 mg/dL    Protein (Total) 7.1 6.0 - 8.2 g/dL    Albumin 4.2 3.5 - 5.2 g/dL    Bilirubin (Total) 0.6 0.2 - 1.3 mg/dL    Calcium 9.3 8.9 - 10.2 mg/dL    AST (GOT) 13 9 - 38 U/L    Alkaline Phosphatase (Total) 69 38 - 172 U/L    ALT (GPT) 15 7 - 33 U/L    eGFR, Calculated >60 >59 mL/min/[1.73_m2]    GFR, Information       Calculated GFR by CKD-EPI equation. Inaccurate with changing renal function. See http://depts.[employer].edu/labweb/test/bclim/cGFR.html.          Results for orders placed or performed in visit on 07/16/19   Lipid Panel   Result Value Ref Range    Cholesterol (Total) 263 (H) <200 mg/dL    Triglyceride 168 (H) <150 mg/dL    Cholesterol (HDL) 43 >39 mg/dL    Cholesterol (LDL) 186 (H) <130 mg/dL    Non-HDL Cholesterol 220 (H) 0 - 159 mg/dL    Cholesterol/HDL Ratio 6.1     Lipid Panel, Additional Info. (NOTE)        Results for orders placed or performed in visit on 07/16/19   Vitamin D (25 Hydroxy)   Result Value Ref Range    Vitamin D2 (25_Hydroxy) 4.2 ng/mL    Vitamin D3 (25_Hydroxy) 24.7 ng/mL    Vit D (25_Hydroxy) Total 28.9 20.1 - 50.0 ng/mL    Vit D (25_Hydroxy) Interp Normal:            20.1-50.0 ng/mL          Food / Nutrition - related history:     Current Outpatient Medications   Medication Sig Dispense Refill   . amLODIPine 10 MG tablet Take 1 tablet (10 mg) by mouth daily. 30 tablet 1   . metoprolol succinate ER 50 MG 24 hr tablet Take 1 tablet (50 mg) by mouth daily. 30 tablet 1     No current facility-administered medications for this visit.        Vitamins/Minerals/Herbal/Supplements: none    Activity/Exercise:  Current knee pain, so unable to do much.  Has mini trampoline.      Diet History:   Food intolerance/allergies: coconut, soy  Diet recall: Reviewed with patient     Nutrition Requirements: kidney friendly    Nutrition Diagnosis: Food and nut related knowledge deficit  related to calcium oxalate renal stones evidenced by comments    Intervention  Medical Nutrition Therapy:  For calcium oxalate kidney stones.  Discussed patient's concerns and goals.  Discussed exercise.  Encouraged patient to consider chair exercise until able to resume walking or other exercise.  Instructed on MNT for calcium oxalate kidney stones.  Discussed ideas to make sure she is drinking enough fluid.  Instructed on using calcium rich foods and avoiding foods high in oxalates.  Discussed portion control and better balancing her plate with more vegetables, which will also help with weight control.  Plan to instruct on heart healthy eating at follow up.    Patient stated goals: decrease risk of developing kidney stones    Nutrition topics taught: Diet:   for calcium oxalate kidney stones    Education Materials Provided: As appropriate to medical nutrition therapy    Monitoring/Evaluation: keeping food and activity log can be helpful.     Plan/Recommendations: Try implementing some of the ideas discussed.     Follow up: In 1 - 2 month(s)     , RDN, CD, CDE  Registered Dietitian/Nutritionist  Certified Diabetes Educator

## 2019-07-20 NOTE — Progress Notes (Signed)
Endocrine: what follow up is recommended for elevated PTH (prior history of subtotal parathyroidectomy)? DEXA also recently completed, thanks.

## 2019-07-21 ENCOUNTER — Ambulatory Visit (HOSPITAL_BASED_OUTPATIENT_CLINIC_OR_DEPARTMENT_OTHER): Payer: Self-pay

## 2019-07-21 NOTE — Progress Notes (Signed)
HTN management     S/O  Pt is a 74 year old community-dwelling female with h/o HTN, HLD, CAD s/p CABG, osteoporosis, and parathyroid tumor s/p parathyroidectomy 2017, and referred for HTN management in the setting of one functioning kidney.    Current HTN regimen:   Amlodipine 10 mg daily   Metoprolol ER 50 mg daily     BP Readings from Last 5 Encounters:   07/16/19 136/44   06/24/19 (!) 173/83   06/01/19 (!) 135/6   04/27/19 (!) (P) 148/70     Lab Results   Component Value Date    CREATININE 0.93 07/16/2019        Cholesterol (HDL)   Date Value Ref Range Status   07/16/2019 43 >39 mg/dL Final     Cholesterol (LDL)   Date Value Ref Range Status   07/16/2019 186 (H) <130 mg/dL Final     Cholesterol (Total)   Date Value Ref Range Status   07/16/2019 263 (H) <200 mg/dL Final     Cholesterol/HDL Ratio   Date Value Ref Range Status   07/16/2019 6.1  Final     Triglyceride   Date Value Ref Range Status   07/16/2019 168 (H) <150 mg/dL Final     Vitamin D: 28.9  Ca 9.3   Parathyroid hormone: 100 (elevated)    A/P    HTN  Goal <140/90 considering age (in the past goal 130/80 due to h/o ASCVD). Controlled per last visit BP. With non-functioning right kidney, will need to carefully adjust medications. Would benefit from having renal protection with ACEi or ARB, but no med changes for now given stable renal function and controlled BP.    -Plan to f/u on home BP readings via phone visit to assess for any medication adjustment needs    Osteoporosis  Patient seen by endocrine for h/o hyperparathyroidism, removed parathyroid 2017. H/o R metatarsal fractures 2017.   -2021 DEXA: lumbar spine -2.4, L hip -1.0, L femoral neck -1.9; FRAX major 11%, hip fracture 2.1%    -Osteopenia per DEXA results. Therapy not indicated, but lumbar spine T-score at borderline and pt had fracture in the past. Could consider IV zoledronic acid 5mg  x 1 and re-assessing DEXA in a year.     Hyperlipidemia   -elevated LDL, total cholesterol, and TG. Pt was  told to stop taking statin years ago as it may interact with her tumor. Patient has been refusing to restart statin per lipid clinic notes.  -Would benefit from statin given her CAD history, if patient is willing to try other statins that she has not tried.       Next clinic follow up: 10/15/19 with PCP    Jearld Shines, PharmD  PGY1 Pharmacy Resident

## 2019-07-22 ENCOUNTER — Encounter (HOSPITAL_BASED_OUTPATIENT_CLINIC_OR_DEPARTMENT_OTHER): Payer: Self-pay | Admitting: Internal Medicine

## 2019-07-22 NOTE — Telephone Encounter (Signed)
Awaiting endocrine response re: elevated PTH on 2/4 labs.

## 2019-07-23 ENCOUNTER — Telehealth (HOSPITAL_BASED_OUTPATIENT_CLINIC_OR_DEPARTMENT_OTHER): Payer: Self-pay | Admitting: Unknown Physician Specialty

## 2019-07-23 ENCOUNTER — Telehealth (HOSPITAL_BASED_OUTPATIENT_CLINIC_OR_DEPARTMENT_OTHER): Payer: Medicare HMO | Admitting: Unknown Physician Specialty

## 2019-07-23 ENCOUNTER — Ambulatory Visit (HOSPITAL_BASED_OUTPATIENT_CLINIC_OR_DEPARTMENT_OTHER)
Admit: 2019-07-23 | Discharge: 2019-07-23 | Disposition: A | Discharge status: Home / Self Care | Payer: Medicare HMO | Attending: Unknown Physician Specialty | Admitting: Unknown Physician Specialty

## 2019-07-23 DIAGNOSIS — I10 Essential (primary) hypertension: Secondary | ICD-10-CM

## 2019-07-23 MED ORDER — HYDROCHLOROTHIAZIDE 12.5 MG OR CAPS
12.5000 mg | ORAL_CAPSULE | Freq: Every day | ORAL | 3 refills | Status: DC
Start: 2019-07-23 — End: 2019-08-03

## 2019-07-23 NOTE — Progress Notes (Signed)
I have personally discussed the case with the resident including review of history, physical exam, diagnosis, and treatment plan. I agree with the assessment and plan of care.    C Crawford PharmD BCPS

## 2019-07-23 NOTE — Progress Notes (Signed)
Referral to pharmacy by Debroah Loop, MD for HTN Management    This visit is being conducted over the telephone at the patient's request: Yes  Patient gives verbal consent to proceed and knows there may be a copay/deductible: Yes     3:15PM  3:51PM    Time spent with patient/guardian on this telephone visit: 36 minutes    Given the importance of social distancing and other strategies recommended to reduce the risk of COVID-19 transmission, I am providing medical care to this patient via a telephone visit in place of an in person visit at the request of the patient.    Subjective/Objective:     [name] is a 74 year old female with PMH of HTN, HLD, CAD s/p CABG, osteoporosis, and parathyroid tumor s/p parathyroidectomy in 2017 who was called today for HTN management.     HTN regimen:  Metoprolol ER 50mg  daily  Amlodipine 10mg  daily    Usually takes meds around 11PM at night. Has been on this regimen for a while because it took her sometime to find medications she wasn't allergic to.     Reports sometimes missing doses - once a week or once every month.     Has not been measuring BP at home.     A couple of days ago, she reported an unusual heartbeat and isn't sure if it's related to high parathyroid hormone. Heartbeat - hasn't been unusual. Didn't feel or notice anything different. It was unsusual symptoms. Apologizes for being vague. Called daughter, took BP and it was a little bit high. So decided to take meds early. SB was 204, did some taichi, and it came down to 170. Took meds and it seemed to be fine again.     During phone call:  BP - 160/85, Pulse 66  Recheck - 161/85    Usually 130s-140s.     Using an arm cuff.   Doesn't drink coffee, had tea this morning.     Denies headaches or vision changes. Had migraine x 1 this week. Once a month or once every other week. Takes aspirin + otc migraine. Usually only the aura, doesn't have headache. One dose helps with aura.     Knee was hurting,  trying to exercise it with since it's osteoarthritis Leg pain - usually OA is in ankle, but not leg. Usually carbonated drink. -- knee pain, has pain in general in joints and legs.  Cold compresses and ice help.       Nutritionist called today- working out diet changes, working on size of food on plate and how much protein, at least 3 days she doesn't do protein. Nutritionist also suggested exercise (be fit) every morning.     Hair on eyebrows is itchy. This has been going for a while - unable to verbalize how long ago.    Pt request mail order pharmacy.      Assessment/Plan:  #HTN: BP appears to be elevated even on recheck.   --Discussed options with patient as to whether to start medication today or to check BP x 1 week then start a medication. Pt opted to start medication today.  --Discussed starting lisinopril vs HCTZ. Pt wanted to avoid anything similar to losartan as she had a bad ringing in her ears in the past, which resolved with stopping medication. She decided to start on HCTZ.   --Discussed why I couldn't increase amlodipine (max dose) or metoprolol (HR = 66).   --Discussed importance  of BP control in kidney preservation  --Start HCTZ 12.5mg  daily  --Continue amlodipine + metoprolol ER as above  --Pt to check BP daily until next f/u    #MTM:  --Discussed mail order pharmacy and insurance contracts with different pharmacies. Pt will call number on back of her card to set up.  --instructed pt to inform mail order pharmacy [facility] to [facility] rx    Follow-up: Tuesday 2/17

## 2019-07-23 NOTE — Telephone Encounter (Signed)
Placed outgoing call for HTN management. Unable to reach patient, left message for patient to call back.

## 2019-07-24 NOTE — Telephone Encounter (Signed)
Scheduled to see endocrine 3/22.

## 2019-07-25 ENCOUNTER — Encounter (HOSPITAL_BASED_OUTPATIENT_CLINIC_OR_DEPARTMENT_OTHER): Payer: Self-pay | Admitting: Internal Medicine

## 2019-07-27 NOTE — Telephone Encounter (Signed)
S/O: Wendy [name] reports via [facility] that HCTZ causing throat closing and itching. This was recently started. RN replied on 2/13 to call 911. Clinical pharmacist is co-managing hypertension.    A/P: Reported allergic reaction symptoms to HCTZ.    SCC RN: please follow up how Wendy [name] is doing? Thanks.    (I am out of the office 2/16 through 2/19, Wendy [name] is covering.)

## 2019-07-28 NOTE — Telephone Encounter (Signed)
[place] states that she is feeling better and has not taken the HCTZ since the episode.  She recently has had another similar problem with coconut.  She is wondering if there is another diuretic that she can try.  D/T the snowy weather she didn't go to the ED, she monitored herself at home.

## 2019-07-29 ENCOUNTER — Encounter (HOSPITAL_BASED_OUTPATIENT_CLINIC_OR_DEPARTMENT_OTHER): Payer: Self-pay | Admitting: "Endocrinology

## 2019-07-31 ENCOUNTER — Ambulatory Visit (HOSPITAL_BASED_OUTPATIENT_CLINIC_OR_DEPARTMENT_OTHER): Payer: Medicare HMO

## 2019-08-03 NOTE — Addendum Note (Signed)
Addended by: Debroah Loop on: 08/03/2019 02:33 PM     Modules accepted: Orders

## 2019-08-03 NOTE — Telephone Encounter (Signed)
Routing to clinical pharmacist who is co-managing hypertension with single kidney. Reports itching and throat closing with HCTZ which she has stopped. Thanks.

## 2019-08-05 ENCOUNTER — Ambulatory Visit (HOSPITAL_BASED_OUTPATIENT_CLINIC_OR_DEPARTMENT_OTHER): Payer: Medicare HMO

## 2019-08-09 ENCOUNTER — Encounter (INDEPENDENT_AMBULATORY_CARE_PROVIDER_SITE_OTHER): Payer: Self-pay | Admitting: Urology

## 2019-08-10 ENCOUNTER — Ambulatory Visit (HOSPITAL_BASED_OUTPATIENT_CLINIC_OR_DEPARTMENT_OTHER): Payer: Medicare HMO | Attending: Pharmacist | Admitting: Pharmacist

## 2019-08-10 DIAGNOSIS — I10 Essential (primary) hypertension: Secondary | ICD-10-CM | POA: Insufficient documentation

## 2019-08-10 MED ORDER — SPIRONOLACTONE 25 MG OR TABS
12.5000 mg | ORAL_TABLET | Freq: Every day | ORAL | 3 refills | Status: DC
Start: 2019-08-10 — End: 2019-08-21

## 2019-08-10 NOTE — Progress Notes (Signed)
Referral to pharmacy by Debroah Loop, MD for HTN Management    Subjective/Objective:     [name] is a 74 year old female with PMH of HTN, HLD, CAD s/p CABG, osteoporosis, and parathyroid tumor s/p parathyroidectomy in 2017 who was called today for HTN management.    HTN regimen: Usually takes meds around 11PM at night.   Metoprolol ER 50mg  daily  Amlodipine 10mg  daily    Started on low dose HCTZ w/ subsequent throat swelling and itching.   Pt wanted to avoid anything similar to losartan as she had a bad ringing in her ears in the past and severe HA.  Reports sometimes missing doses - once a week or once every month.     Has not been measuring BP of late. Today at home: 172/76 HR 61  Using an arm cuff.   Doesn't drink coffee, had tea this morning.     Still get very occasionally  an unusual heartbeat and isn't sure if it's related to high parathyroid hormone.   Typically does daily taichi but not of late. Does take a walk each day.     Denies headaches or vision changes. Has h/o migraine once a month or once every other week. Takes aspirin + otc migraine. Usually only the aura, doesn't have headache. One dose helps with aura.     Knee was hurting, trying to exercise it with since it's osteoarthritis Leg pain - usually OA is in ankle, but not leg.   Cold compresses and ice help.     Nutritionist very helpful per pt regarding what foods contain calcium, exercise and hydration.    Denies CP, SOB, dizziness or falls.    Assessment/Plan:  #HTN: continues to be elevated. HCTZ stopped after 2 doses d/t distict allergy symptoms.  -- will add spironolactone 12.5mg  daily to current BB and CCB regimen.    --Pt to check BP daily until next f/u in ~ 10 days- labs needed in future.  -- Discussed need to stay well hydrated.     #MTM:  --Pt wanting mail order pharmacy. Chiropodist with different pharmacies. Pt will call number on back of her card to set up/inquire.  --Pt knows to  to inform mail order  pharmacy [facility] to [facility] rx.    Follow-up: Tuesday 08/21/19      Varney Baas PharmD BCPS

## 2019-08-10 NOTE — Progress Notes (Signed)
Given the importance of social distancing and other strategies recommended to reduce the risk of COVID-19 transmission, I am providing medical care to this patient via a telephone visit in place of an in person visit at the request of the patient.    This visit is being conducted over the telephone at the patient's request: Yes  Patient gives verbal consent to proceed and knows there may be a copay/deductible: Yes/ no deductible.

## 2019-08-11 ENCOUNTER — Telehealth: Payer: Self-pay | Admitting: Cardiology

## 2019-08-11 ENCOUNTER — Ambulatory Visit (HOSPITAL_BASED_OUTPATIENT_CLINIC_OR_DEPARTMENT_OTHER): Payer: Medicare HMO

## 2019-08-11 DIAGNOSIS — Z23 Encounter for immunization: Secondary | ICD-10-CM

## 2019-08-11 NOTE — Telephone Encounter (Signed)
Patient move to [facility].

## 2019-08-12 ENCOUNTER — Ambulatory Visit (HOSPITAL_BASED_OUTPATIENT_CLINIC_OR_DEPARTMENT_OTHER): Payer: Self-pay

## 2019-08-12 NOTE — Telephone Encounter (Signed)
Pt is scheduled for a TeleHealth visit with PA Lattie Haw on 08/14/19.

## 2019-08-13 ENCOUNTER — Encounter (INDEPENDENT_AMBULATORY_CARE_PROVIDER_SITE_OTHER): Payer: Self-pay | Admitting: Physician Assistant

## 2019-08-14 ENCOUNTER — Telehealth (INDEPENDENT_AMBULATORY_CARE_PROVIDER_SITE_OTHER): Payer: Medicare HMO | Admitting: Physician Assistant

## 2019-08-14 ENCOUNTER — Encounter (INDEPENDENT_AMBULATORY_CARE_PROVIDER_SITE_OTHER): Payer: Self-pay | Admitting: Physician Assistant

## 2019-08-14 DIAGNOSIS — N200 Calculus of kidney: Secondary | ICD-10-CM

## 2019-08-14 DIAGNOSIS — Z905 Acquired absence of kidney: Secondary | ICD-10-CM

## 2019-08-14 NOTE — Patient Instructions (Addendum)
-  Complete your kidney ultrasound- call [phone] to schedule    - Complete blood test 1-2 weeks after starting supplement    -Repeat 24 hour urine collection in 4 weeks    - Follow up with me after completing urine collection to review results    -Follow up with PCP regarding GERD and gallstones
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
## 2019-08-19 ENCOUNTER — Telehealth (INDEPENDENT_AMBULATORY_CARE_PROVIDER_SITE_OTHER): Payer: Self-pay | Admitting: Physician Assistant

## 2019-08-19 NOTE — Telephone Encounter (Signed)
Called and LVM for pt inquiring if pt received Litholink kit yet since it was recently mailed out on 08/10/19 (takes 7-10 business days to arrive). Provided the [facility] phone number in case pt need a new kit to be sent out.

## 2019-08-21 ENCOUNTER — Ambulatory Visit (HOSPITAL_BASED_OUTPATIENT_CLINIC_OR_DEPARTMENT_OTHER): Payer: Medicare HMO | Attending: Pharmacist | Admitting: Pharmacist

## 2019-08-21 DIAGNOSIS — I10 Essential (primary) hypertension: Secondary | ICD-10-CM | POA: Insufficient documentation

## 2019-08-21 MED ORDER — SPIRONOLACTONE 25 MG OR TABS
25.0000 mg | ORAL_TABLET | Freq: Every day | ORAL | 3 refills | Status: DC
Start: 2019-08-21 — End: 2019-11-06

## 2019-08-21 NOTE — Progress Notes (Signed)
Referral to pharmacy by Debroah Loop, MD for HTN Management    Subjective/Objective:     Wendy [name] is a 74 year old female with PMH of HTN, HLD, CAD s/p CABG, osteoporosis, and parathyroid tumor s/p parathyroidectomy in 2017 who was called today for HTN management.    HTN regimen: Usually takes meds around 11PM at night.   Metoprolol ER 50mg  daily  Amlodipine 10mg  daily  Spironolactone 12.5mg  daily -- started 08/10/19  Of note: low dose HCTZ - throat swelling and hives       Pt wanted to avoid anything similar to losartan as she had a bad ringing in her ears in the past and severe HA.  Reports sometimes missing doses - once a week or once every month.     Has not been measuring BP of late. Cuff needs new batteries.  Using an arm cuff.   Doesn't drink coffee, had tea this morning.     Still get very occasionally  an unusual heartbeat and isn't sure if it's related to high parathyroid hormone.   Typically does daily taichi but not of late. Used to take a walk each day.     Feeling depressed of late: Tearful and not sleeping well. Denies thoughts of self harm. Thinks that she needs more interaction than she is getting. Hasn't been able to get out to meet people since move from [facility] (moved Oct 2020) d/t pandemic and children are very busy. Brother committed suicide.in distant past Wanted PCP to know about that.    Denies headaches or vision changes. Has h/o migraine once a month - not any of late. Takes aspirin + otc migraine. Usually only the aura, doesn't have headache. One dose helps with aura.     Knee was hurting, trying to exercise it with since it's osteoarthritis Leg pain - usually OA is in ankle, but not leg.   Cold compresses and ice help.     Nutritionist very helpful per pt regarding what foods contain calcium, exercise and hydration. Pt wouldn't mind talking with her again.    Pt having GERD. Saw a provider in the past who stated that she had that. Not taking any medication. Elevating head  helpful.    Denies CP, SOB, dizziness or falls.    Assessment/Plan:  #HTN: continues to be elevated. HCTZ stopped after 2 doses d/t distict allergy symptoms. Tolerating the new addition of spironolactone.  -- will  Increase to spironolactone 25mg  daily to current BB and CCB regimen.    --Pt will start testing BP - Labs needed and ordered when convenient  -- Discussed need to stay well hydrated.     #MTM:  --Pt wanting mail order pharmacy. Chiropodist with different pharmacies. Pt will call number on back of her card to set up/inquire.  --Pt knows to  to inform [facility] to [facility] rx.    Follow-up: PCP 10/15/19-- passing on info to PCP about depression, would like to see nutritionist again, GERD.    Next phone visit with PharmD for BP check in -- 2 weeks.      Varney Baas PharmD BCPS

## 2019-08-21 NOTE — Progress Notes (Signed)
Given the importance of social distancing and other strategies recommended to reduce the risk of COVID-19 transmission, I am providing medical care to this patient via a telephone visit in place of an in person visit at the request of the patient.    This visit is being conducted over the telephone at the patient's request: Yes  Patient gives verbal consent to proceed and knows there may be a copay/deductible: Yes- no copayment

## 2019-08-24 ENCOUNTER — Ambulatory Visit: Payer: Medicare HMO | Attending: "Endocrinology | Admitting: Unknown Physician Specialty

## 2019-08-24 VITALS — BP 158/67 | HR 73 | Resp 24 | Wt 213.6 lb

## 2019-08-24 DIAGNOSIS — E213 Hyperparathyroidism, unspecified: Secondary | ICD-10-CM | POA: Insufficient documentation

## 2019-08-24 DIAGNOSIS — K219 Gastro-esophageal reflux disease without esophagitis: Secondary | ICD-10-CM | POA: Insufficient documentation

## 2019-08-24 DIAGNOSIS — R1310 Dysphagia, unspecified: Secondary | ICD-10-CM | POA: Insufficient documentation

## 2019-08-24 DIAGNOSIS — N200 Calculus of kidney: Secondary | ICD-10-CM | POA: Insufficient documentation

## 2019-08-24 DIAGNOSIS — M810 Age-related osteoporosis without current pathological fracture: Secondary | ICD-10-CM | POA: Insufficient documentation

## 2019-08-24 DIAGNOSIS — R1319 Other dysphagia: Secondary | ICD-10-CM

## 2019-08-24 LAB — BASIC METABOLIC PANEL
Anion Gap: 7 (ref 4–12)
Calcium: 9.9 mg/dL (ref 8.9–10.2)
Carbon Dioxide, Total: 31 meq/L (ref 22–32)
Chloride: 101 meq/L (ref 98–108)
Creatinine: 0.99 mg/dL (ref 0.38–1.02)
Glucose: 87 mg/dL (ref 62–125)
Potassium: 3.8 meq/L (ref 3.6–5.2)
Sodium: 139 meq/L (ref 135–145)
Urea Nitrogen: 22 mg/dL — ABNORMAL HIGH (ref 8–21)
eGFR by CKD-EPI: 57 mL/min/{1.73_m2} — ABNORMAL LOW (ref >59)

## 2019-08-24 MED ORDER — PANTOPRAZOLE SODIUM 40 MG OR TBEC
40.0000 mg | DELAYED_RELEASE_TABLET | Freq: Every day | ORAL | 0 refills | Status: DC
Start: 2019-08-24 — End: 2019-11-12

## 2019-08-24 NOTE — Progress Notes (Signed)
Pt states SBP's have been in 170's

## 2019-08-25 ENCOUNTER — Other Ambulatory Visit (HOSPITAL_BASED_OUTPATIENT_CLINIC_OR_DEPARTMENT_OTHER): Payer: Self-pay | Admitting: Adult Health

## 2019-08-25 DIAGNOSIS — I10 Essential (primary) hypertension: Secondary | ICD-10-CM

## 2019-08-25 NOTE — Progress Notes (Signed)
Endocrinology Clinic Follow Up    DATE: 08/24/2019    CHIEF COMPLAINT: No chief complaint on file.      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      INTERVAL HISTORY   Patient was last seen for initial consultation about 3 months ago in December 2020.  Since then, patient has had a repeat labs which have shown a DEXA scan with osteopenia with borderline osteoporosis with the lowest T score of -2.4, the patient is also had a repeat labs showing a normal calcium and elevated PTH of 100, and thus far the patient has not had any new kidney stones in the last 3 months.    Speaking to the patient today, the patient is most concerned interestingly about significant reflux.  The patient says that she notices significant reflux and even occasionally some dysphagia to both liquids and solids, worse with solids, over the last few months.  She says that she has had reflux in the past but has been done well with functional changes such as smaller meals and spacing out her meals before bedtime which he says in the last few months they have progressed significantly despite dietary changes.  The patient has seen multiple doctors for this complaint including her pulmonologist as she is also noted shortness of breath and it is believed likely to be reflux as opposed to a pulmonary cardiac disease although unclear at this time.  Otherwise, the patient denies any significant chest pain, constitutional signs, vomiting or significant weight loss.    In terms of the patient's hyperparathyroidism, the patient denies any significant signs of hypercalcemia including muscle aches, bone pains, cognitive dysfunction or  fatigue.  The patient has not had any new fractures or recent falls.      ROS:   Complete ROS was obtained and are negative except that which is listed in the HPI      PHYSICAL EXAM   Vitals:    08/24/19 1419 08/24/19 1422   BP:  (!) 158/67   BP Cuff Size: Large Large   BP Site: Right Arm Right Arm   BP Position: Sitting Sitting   Pulse:  73   Resp:  24   SpO2:  98%   Weight: 213 lb 9.6 oz (96.9 kg) 213 lb 9.6 oz (96.9 kg)         GENERAL: Middle-age Caucasian woman, pleasant and cooperative, no acute distress  EYES: EOMI, PERRLA  ENT: Normocephalic, atraumatic  NECK: No goiter or thyromegaly  LYMPH NODES: no cervical lymphadenopathy  CHEST: CTAB, normal work of breathing  ABDOMEN: Soft, nontender, nondistended  DERM: No jaundice or rashes appreciated  NEURO: Alert and oriented, no focal neurological deficits  PSYCH: Patient is very pleasant and cooperative, we had a long discussion about how to address the patient's hyperparathyroidism        PROBLEM LIST:  Patient Active Problem List   Diagnosis   . Nephrolithiasis   . Hyperparathyroidism ([place])   . Osteoporosis without current pathological fracture   . Non-functioning R kidney   . Essential hypertension   . Aortic stenosis, mild   . Asthma   . Bipolar disorder without psychotic features ([place])   . Atherosclerosis of native coronary artery of native  heart   . Colon polyps   . Depression   . Generalized anxiety disorder   . Mixed hyperlipidemia   . Migraine   . History of R parathyroid adenoma   . History of R parathyroidectomy (2017)   . Thyroid disease   . Vitamin D deficiency   . Hx of CABG   . Aortic regurgitation   . Pulmonary nodule less than 6 cm determined by computed tomography of lung   . Abnormal computed tomography of pelvis   . Body mass index 40.0-44.9, adult ([place])   . History of R metatarsal fractures   . Primary osteoarthritis of right knee   . Esophageal dysphagia   . Gastroesophageal reflux disease without esophagitis         MEDICATIONS:  Current  Outpatient Medications   Medication Sig Dispense Refill   . amLODIPine 10 MG tablet Take 1 tablet (10 mg) by mouth daily. 30 tablet 1   . metoprolol succinate ER 50 MG 24 hr tablet Take 1 tablet (50 mg) by mouth daily. 30 tablet 1   . pantoprazole 40 MG EC tablet Take 1 tablet (40 mg) by mouth daily. 60 tablet 0   . spironolactone 25 MG tablet Take 1 tablet (25 mg) by mouth daily. 90 tablet 3     No current facility-administered medications for this visit.           LAB RESULTS:      Patient's labs are notable for a normal calcium of 9.3, normal creatinine of 0.93 with GFR greater than 60.  Patient also has a DEXA scan which shows osteopenia with lowest T score -2.4.  Despite the normal calcium, patient has abnormally elevated PTH of 100.    Narrative & Impression     EXAMINATION:  DEXA, AXIAL SKELETON: 07/16/2019     INDICATION:  74 year old postmenopausal female, hyperparathyroidism.  TECHNIQUE:  Bone mineral densitometry of the lumbar spine (L1-L4) and left hip was   performed with [employer].     Imaging Site: [facility]     COMPARISON:  None available.     FINDINGS:  BONE MINERAL DENSITY DETERMINATION:     Technical Limitations: None.     Lumbar spine (L1-L4)  Average bone mineral density: 0.786 g/cm2  T-score: -2.4 (75% young normal values)  Z-score: -0.1 (98% age-matched normal values)     Left total hip  Average bone mineral density: 0.823 g/cm2  T-score: -1.0 (87% young normal values)  Z-score: 0.7 (111% age-matched normal values)     Left femoral neck  Average bone mineral density: 0.633 g/cm2   T-score: -1.9 (75% young normal values)   Z-score: 0.0 (100% age-matched normal values)   Left 33% radius  Average bone mineral density: 0.597 g/cm2  T-score: -1.6 (86% young normal values)  Z-score: 0.7 (107% age-matched normal values)     The 10-year probability of a major osteoporotic fracture as determined by the   FRAX algorithm (based upon the femoral neck BMD measurement and secondary   osteoporosis) is 11%  %; the 10-year probability of hip fracture is 2.1%%.     Trabecular bone score (TBS) for L1-L4: 1.133  This suggests poor bone quality.  The TBS-adjusted FRAX results in a 10-year major osteoporotic fracture risk of   13.1% and a 10-year hip fracture risk of 2.7%.            ASSESSMENT/PLAN   Wendy [name] is a 74 year old female with a history of primary hyperparathyroidism status post resection  of parathyroid adenoma at [facility] in 2017, osteoporosis and significance nephrolithiasis presented to [place] for consultation of hyperparathyroidism in setting of recent significant nephrocalcinosis leading to significant kidney disease.    #Progressive reflux with dysphagia  Patient's primary complaint today is progressive reflux not responding to dietary changes and with intermittent dysphagia worse with solids than liquids.  We recommended that the patient follow-up with Dr. Ronalee Red to discuss possible consultation with gastroenterology as due to her age as well as dysphagia, it may be worth investigation with an upper endoscopy.  Otherwise, we feel comfortable prescribing the patient a short course of Protonix to see if there is symptomatic improvement in her reflux.  -Protonix 40 mg p.o. daily, short course until patient can see Dr. Ronalee Red and possibly gastroenterology  -Due to the patient's age and new dysphagia, consideration for upper endoscopy with gastroenterology after discussion with Dr. 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  The patient still has some concerning signs most notably the nephrocalcinosis which she believes is from her previous hyperparathyroidism but is always of concern.  In addition, the patient's  DEXA scan shows osteopenia but a borderline T score -2.4 very close to osteoporosis.  Nevertheless, the patient at this time has normal calcium and so we think the best course would be to repeat her labs in about 6 months.  If we start to see progression in terms of the patient's hypercalcemia, we will then consider repeat imaging of the neck with SPECT/CT of the parathyroids.  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  -Recommend monitoring with repeat labs in 6 months including calcium, parathyroid hormone, vitamin D, 24-hour urine calcium  -If we see progression in the patient's hypercalcemia, kidney stones, osteopenia, will then recommend SPECT-CT of the parathyroids to pursue potential surgical evaluation  -Our suspicion is that the patient has parathyroid hyperplasia of multiple parathyroid glands in addition to the patient's previous parathyroid adenoma status post resection        RTC:  Return in about 6 months (around 02/24/2020).      This patient was seen and discussed with Dr. Vassie Loll    Thank you for allowing me to be involved in this patient's care.    Horris Latino MD  Fellow, Department of Endocrinology  [facility] [facility] [place]

## 2019-08-26 ENCOUNTER — Encounter (HOSPITAL_BASED_OUTPATIENT_CLINIC_OR_DEPARTMENT_OTHER): Payer: Self-pay | Admitting: Internal Medicine

## 2019-08-26 ENCOUNTER — Telehealth (HOSPITAL_BASED_OUTPATIENT_CLINIC_OR_DEPARTMENT_OTHER): Payer: Self-pay

## 2019-08-26 DIAGNOSIS — I10 Essential (primary) hypertension: Secondary | ICD-10-CM

## 2019-08-26 MED ORDER — AMLODIPINE BESYLATE 10 MG OR TABS
10.0000 mg | ORAL_TABLET | Freq: Every day | ORAL | 2 refills | Status: DC
Start: 2019-08-26 — End: 2019-11-06

## 2019-08-26 MED ORDER — METOPROLOL SUCCINATE ER 50 MG OR TB24
50.0000 mg | EXTENDED_RELEASE_TABLET | Freq: Every day | ORAL | 2 refills | Status: DC
Start: 2019-08-26 — End: 2019-11-06

## 2019-08-26 NOTE — Telephone Encounter (Signed)
Refills requested yesterday & today  for the following medications:        metoprolol succinate ER 50 MG 24 hr tablet [[place]ne Tegegne, ARNP]        amLODIPine 10 MG tablet [Semahugne Tegegne, ARNP]    Preferred pharmacy: [facility] [phone] [place] [phone] [phone]           08/24/19 1419 08/24/19 1422   BP:  (!) 158/67   BP Cuff Size: Large Large   BP Site: Right Arm Right Arm   BP Position: Sitting Sitting   Pulse:  73   Resp:  24   SpO2:  98%   Weight: 213 lb 9.6 oz (96.9 kg) 213 lb 9.6 oz (96.9 kg

## 2019-08-26 NOTE — Telephone Encounter (Signed)
[facility]: please offer earlier follow up appointment either in-person or telemedicine for acid reflux and depression symptoms, thanks.

## 2019-08-26 NOTE — Telephone Encounter (Signed)
Refills authorized-- was planning to review BP data in another week. Does that plan still work for her or did she want to discuss now? cac

## 2019-08-26 NOTE — Progress Notes (Signed)
Hi Chela,    Your blood results show a slight change in kidney function. Please complete your renal ultrasound.     Best,    Melvenia Beam, PA-C  Urology  08/26/2019

## 2019-08-26 NOTE — Telephone Encounter (Signed)
Noted, thanks!

## 2019-08-26 NOTE — Telephone Encounter (Signed)
Called and spoke to pt re sooner appt w/ PCP. Pt stated she is doing better. Just started taking Pantoprazole for GERD a couple days ago so not sure how effective it is. Pt has upcoming Telemed appt w/ [facility]. She will call back re sooner appt after seeing them.

## 2019-08-27 ENCOUNTER — Telehealth (HOSPITAL_BASED_OUTPATIENT_CLINIC_OR_DEPARTMENT_OTHER): Payer: Self-pay

## 2019-08-27 NOTE — Progress Notes (Signed)
I saw and evaluated the patient. I have reviewed the resident's documentation and agree with it. Complicated. OK to keep monitoring high PTH as long as serum and urine Ca stay reasonable. Nephro working on her stone forming.

## 2019-08-28 LAB — GASTRIN: Gastrin Result (Sendout): 16 pg/mL (ref 0–99)

## 2019-08-31 ENCOUNTER — Encounter (HOSPITAL_BASED_OUTPATIENT_CLINIC_OR_DEPARTMENT_OTHER): Payer: Medicare HMO | Admitting: Unknown Physician Specialty

## 2019-09-02 ENCOUNTER — Ambulatory Visit: Payer: Medicare HMO | Attending: Internal Medicine | Admitting: Registered"

## 2019-09-02 DIAGNOSIS — E782 Mixed hyperlipidemia: Secondary | ICD-10-CM | POA: Insufficient documentation

## 2019-09-02 DIAGNOSIS — N200 Calculus of kidney: Secondary | ICD-10-CM

## 2019-09-02 DIAGNOSIS — K219 Gastro-esophageal reflux disease without esophagitis: Secondary | ICD-10-CM | POA: Insufficient documentation

## 2019-09-02 NOTE — Progress Notes (Signed)
Distant Site Telemedicine Encounter    I conducted this encounter from [facility] via secure, live, face-to-face video conference with the patient. Wendy [name] was located at home on her own.  Prior to the interview, the risks and benefits of telemedicine were discussed with the patient and verbal consent was obtained.     Nutrition Clinic     Note type: Reassessment  Chief Complaint   Patient presents with   . Kidney Problem   . Weight Problem   . Hyperlipidemia   . Gastroesophageal Reflux     Direct time with patient:  60 mins   Interpreter used: NO    Assessment  (N20.0) Nephrolithiasis  (primary encounter diagnosis)  (E78.5) Hyperlipidemia, unspecified hyperlipidemia type  (E66.01) Obesity, morbid, BMI 40.0-49.9 ([place])    Client History:  Information from patient:  Found information on oxalates and GERD helpful.  Has been using the plate method for planning meals.  Purchased a smaller plate.  Admits to some cheating on chocolate.  Has been drinking more water.  Says her GERD affects her breathing when she goes walking.  Also causes swallowing issues.     Social History     Tobacco Use   . Smoking status: Never Smoker   . Smokeless tobacco: Never Used   Substance Use Topics   . Alcohol use: Not Currently     Family History     Problem (# of Occurrences) Relation (Name,Age of Onset)    Ankylosing Spondylitis (1) Maternal Uncle    Arthritis (1) Maternal Grandmother    Bipolar Disorder (2) Mother: Per outside records, Brother: Per outside records    Brain Cancer (1) Sister    Breast Cancer (1) Paternal Aunt    Hypertension (2) Sister: Per outside records, Mother    Myocardial Infarction (1) Father (28)       Negative family history of: Osteoporosis, Stroke          Anthropometric Measurements:   There were no vitals taken for this visit.  There is no height or weight on file to calculate BMI.  Wt Readings from Last 5 Encounters:   08/24/19 213 lb 9.6 oz (96.9 kg)   07/16/19 211 lb 4.8 oz (95.8 kg)   06/24/19 212 lb 8 oz  (96.4 kg)   06/01/19 206 lb (93.4 kg)   04/27/19 (P) 209 lb (94.8 kg)        Biochemical Data, Medical Tests and Procedures:    Results for orders placed or performed in visit on 07/16/19   Comprehensive Metabolic Panel   Result Value Ref Range    Sodium 141 135 - 145 meq/L    Potassium 3.7 3.6 - 5.2 meq/L    Chloride 104 98 - 108 meq/L    Carbon Dioxide, Total 31 22 - 32 meq/L    Anion Gap 6 4 - 12    Glucose 79 62 - 125 mg/dL    Urea Nitrogen 12 8 - 21 mg/dL    Creatinine 0.93 0.38 - 1.02 mg/dL    Protein (Total) 7.1 6.0 - 8.2 g/dL    Albumin 4.2 3.5 - 5.2 g/dL    Bilirubin (Total) 0.6 0.2 - 1.3 mg/dL    Calcium 9.3 8.9 - 10.2 mg/dL    AST (GOT) 13 9 - 38 U/L    Alkaline Phosphatase (Total) 69 38 - 172 U/L    ALT (GPT) 15 7 - 33 U/L    eGFR, Calculated >60 >59 mL/min/[1.73_m2]    GFR, Information  Calculated GFR by CKD-EPI equation. Inaccurate with changing renal function. See http://depts.[email or web address].fr.html.        Results for orders placed or performed in visit on 07/16/19   Lipid Panel   Result Value Ref Range    Cholesterol (Total) 263 (H) <200 mg/dL    Triglyceride 168 (H) <150 mg/dL    Cholesterol (HDL) 43 >39 mg/dL    Cholesterol (LDL) 186 (H) <130 mg/dL    Non-HDL Cholesterol 220 (H) 0 - 159 mg/dL    Cholesterol/HDL Ratio 6.1     Lipid Panel, Additional Info. (NOTE)        Results for orders placed or performed in visit on 07/16/19   Vitamin D (25 Hydroxy)   Result Value Ref Range    Vitamin D2 (25_Hydroxy) 4.2 ng/mL    Vitamin D3 (25_Hydroxy) 24.7 ng/mL    Vit D (25_Hydroxy) Total 28.9 20.1 - 50.0 ng/mL    Vit D (25_Hydroxy) Interp Normal:            20.1-50.0 ng/mL          Food / Nutrition - related history:     Current Outpatient Medications   Medication Sig Dispense Refill   . amLODIPine 10 MG tablet Take 1 tablet (10 mg) by mouth daily. 90 tablet 2   . metoprolol succinate ER 50 MG 24 hr tablet Take 1 tablet (50 mg) by mouth daily. 90 tablet 2   . pantoprazole 40 MG EC  tablet Take 1 tablet (40 mg) by mouth daily. 60 tablet 0   . spironolactone 25 MG tablet Take 1 tablet (25 mg) by mouth daily. 90 tablet 3     No current facility-administered medications for this visit.        Vitamins/Minerals/Herbal/Supplements: none    Activity/Exercise: Current knee pain, so unable to do much.  Has mini trampoline.      Diet History:   Food intolerance/allergies: coconut, soy  Diet recall: Reviewed with patient     Nutrition Requirements: kidney friendly    Nutrition Diagnosis: Food and nut related knowledge deficit  related to calcium oxalate renal stones evidenced by comments    Intervention  Medical Nutrition Therapy:  For calcium oxalate kidney stones and GERD.  Discussed patient's changes and challenges.  Commended her for implementing some of the ideas discussed at last visit.  Discussed exercise.  Encouraged patient to consider seeing PCP for work up on GERD as it is affecting her ability to be active.  Discussed her diet.  Continue drinking enough fluids.  Instructed patient on Medical Nutrition Therapy for GERD.  Stressed importance of eating smaller, more frequent meals and chewing food well.  Instructed on avoiding any foods that cause symptoms.  Provided list of foods generally well tolerated.  Instructed to keep food record of intake and resulting symptoms.  Plan to instruct on heart healthy eating at follow up.    Patient stated goals: decrease risk of developing kidney stones, control GERD symptoms.     Nutrition topics taught: Diet:   for calcium oxalate kidney stones and GERD    Education Materials Provided: As appropriate to medical nutrition therapy    Monitoring/Evaluation: keeping food and activity log can be helpful.     Plan/Recommendations: Try implementing some of the ideas discussed.     Follow up: In 1 - 2 month(s)    Ishmael Holter, RDN, CD, CDE  Registered Dietitian/Nutritionist  Certified Diabetes Educator

## 2019-09-04 ENCOUNTER — Ambulatory Visit: Payer: Medicare HMO | Attending: Diagnostic Radiology

## 2019-09-04 ENCOUNTER — Telehealth (INDEPENDENT_AMBULATORY_CARE_PROVIDER_SITE_OTHER): Payer: Self-pay | Admitting: Medical

## 2019-09-04 ENCOUNTER — Ambulatory Visit (HOSPITAL_BASED_OUTPATIENT_CLINIC_OR_DEPARTMENT_OTHER): Payer: Medicare HMO

## 2019-09-04 ENCOUNTER — Telehealth (HOSPITAL_BASED_OUTPATIENT_CLINIC_OR_DEPARTMENT_OTHER): Payer: Self-pay

## 2019-09-04 DIAGNOSIS — N132 Hydronephrosis with renal and ureteral calculous obstruction: Secondary | ICD-10-CM | POA: Insufficient documentation

## 2019-09-04 DIAGNOSIS — N200 Calculus of kidney: Secondary | ICD-10-CM | POA: Insufficient documentation

## 2019-09-04 NOTE — Progress Notes (Unsigned)
Referral to pharmacy by Debroah Loop, MD for HTN Management    Subjective/Objective:     [name] is a 74 year old female with PMH of HTN, HLD, CAD s/p CABG, osteoporosis, and parathyroid tumor s/p parathyroidectomy in 2017 who was called today for HTN management.    HTN regimen: Usually takes meds around 11PM at night.   Metoprolol ER 50mg  daily  Amlodipine 10mg  daily  Spironolactone 12.5mg  daily -- started 08/10/19- increased to 25mg  daily 2 weeks ago.  Of note: low dose HCTZ - throat swelling and hives       Pt wanted to avoid anything similar to losartan as she had a bad ringing in her ears in the past and severe HA. Losartan= HA.  Reports sometimes missing doses - once a week or once every month.     Has not been measuring BP of late. Cuff needs new batteries.  Using an arm cuff.   Doesn't drink coffee, had tea this morning.     Still get very occasionally  an unusual heartbeat and isn't sure if it's related to high parathyroid hormone.   Typically does daily taichi but not of late. Used to take a walk each day.     Had been feeling depressed of late: Tearful and not sleeping well. Denies thoughts of self harm. Thinks that she needs more interaction than she is getting. Hasn't been able to get out to meet people since move from [facility] (moved Oct 2020) d/t pandemic and children are very busy. Brother committed suicide.in distant past Wanted PCP to know about that. Didn't check in on this today.     Knee(s) no longer hurting.     Nutritionist very helpful per pt regarding what foods contain calcium, exercise and hydration. Wt [employer] started 2 weeks ago.    Pt having GERD.Elevating head helpful as is the start of a PPI.    Denies CP, SOB, dizziness or falls.    Assessment/Plan:  #HTN: relatively compliant on amlodipine 10mg , metoprolol sr 25mg  daily, spirololactone 25mg  ( dose increased 2 weeks ago).  No home BP numbers (doesn't like her cuff)- will get labs and BP done at appt at [facility] today.    -f/u subsequently.     #MTM:  --Pt wanting mail order pharmacy. Chiropodist with different pharmacies. Pt will call number on back of her card to set up/inquire.  --Pt knows to  to inform mail order pharmacy of [facility] to [facility] rx.    Follow-up: PCP 10/15/19    Varney Baas PharmD BCPS

## 2019-09-04 NOTE — Telephone Encounter (Signed)
Radiology called the office to say patient had a 2.5 cm RT mid ureteral stone with severe hydronephrosis on renal ultrasound. In reviewing prior records this is a chronic issue and not acute.     I spoke with Wendy [name] to check in. Reviewed ultrasound results. No left flank pain. She notices occasional RT flank discomfort mostly with laying down. No fevers. Continue plan as outlined last by Melvenia Beam PA-C. Reviewed ED precautions. She verbalized understanding.

## 2019-09-04 NOTE — Telephone Encounter (Signed)
RETURN CALL: Voicemail - Detailed Message      SUBJECT:  General Message     MESSAGE: Patient is still waiting for her 10AM phone appointment.   Urgent per caller

## 2019-09-20 IMAGING — US ULTRASOUND ABDOMEN LIMITED
1 series · 14 of 25 positions shown · non-contrast
Comparison: None.

CLINICAL DATA: Abdomen pain

EXAM:
ULTRASOUND ABDOMEN LIMITED RIGHT UPPER QUADRANT

[Series 1: ultrasound abdomen limited · 14 of 41 slices shown]
[im 1/41]
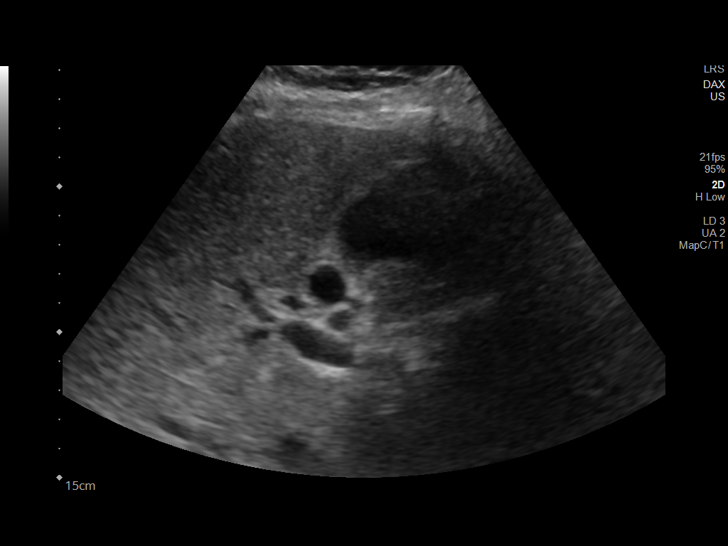
[im 4/41]
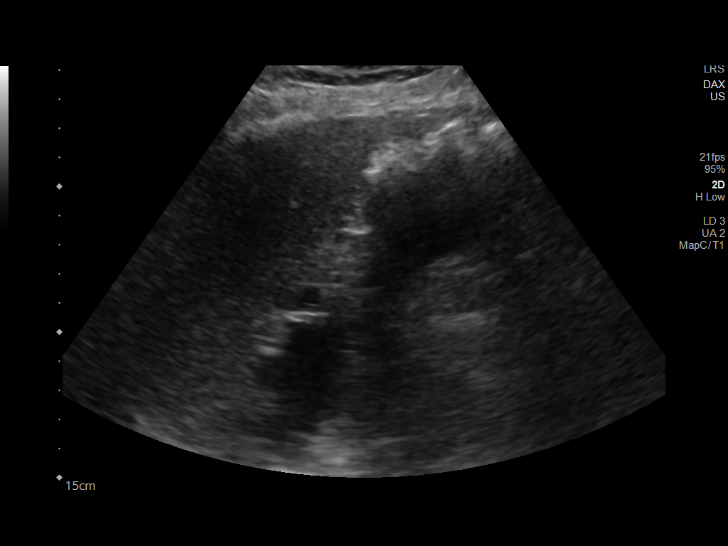
[im 7/41]
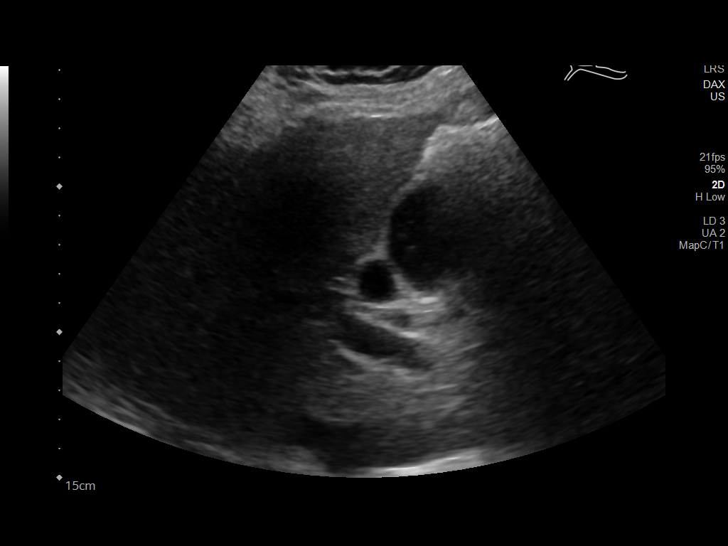
[im 11/41]
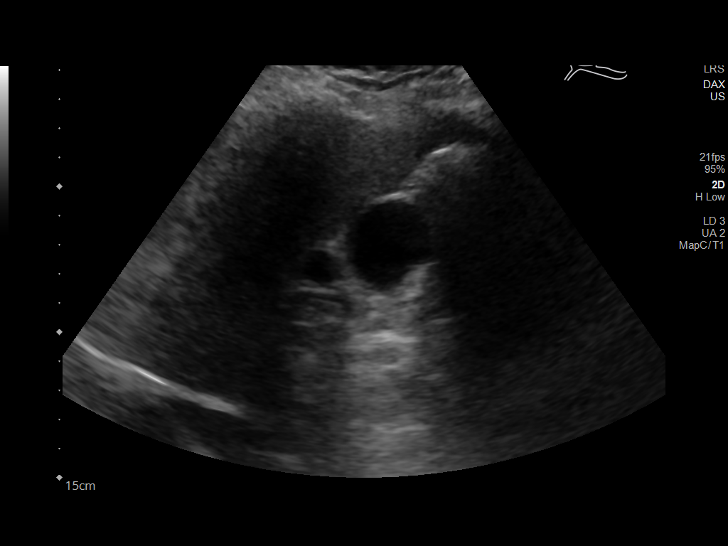
[im 14/41]
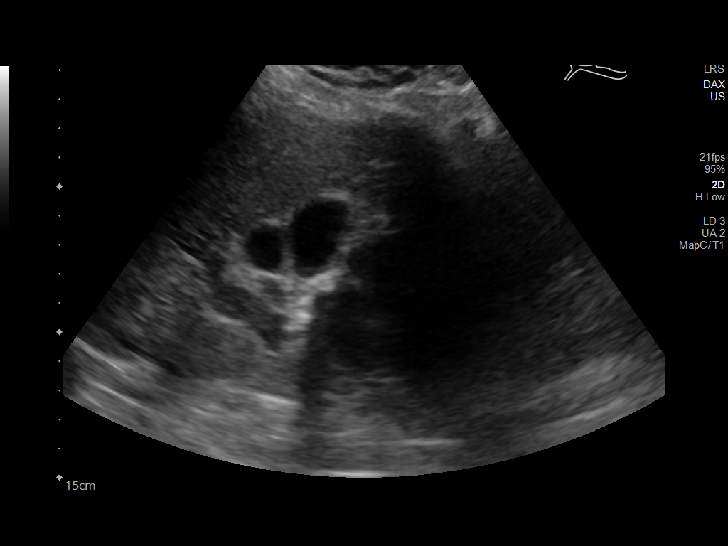
[im 16/41]
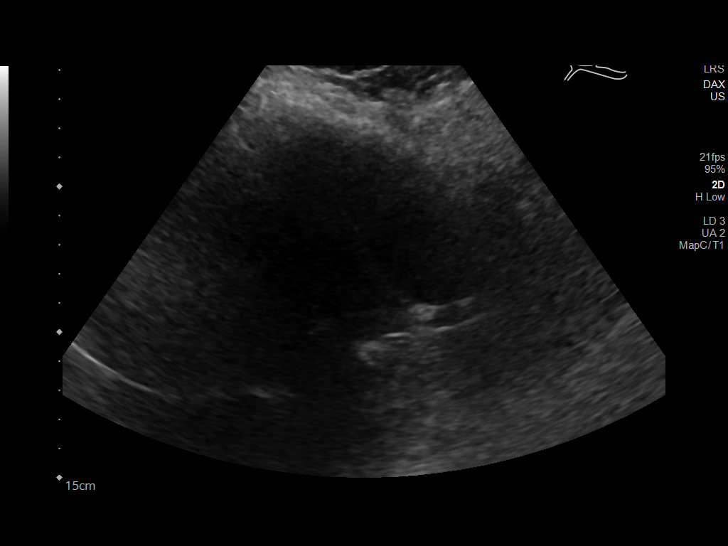
[im 19/41]
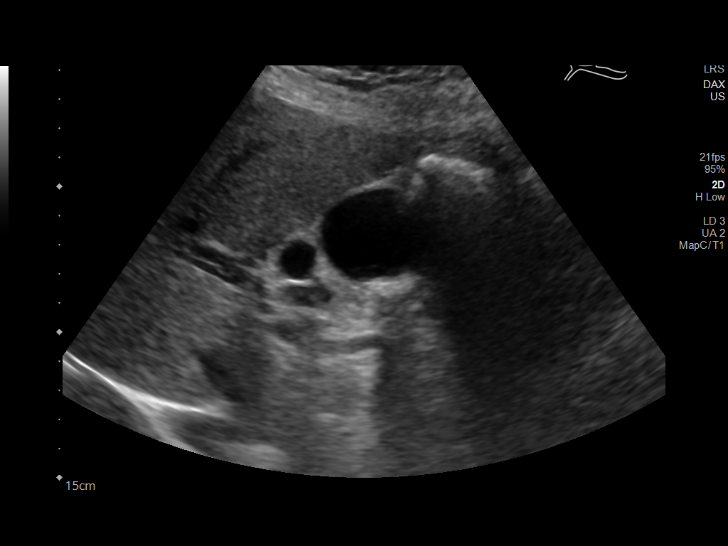
[im 22/41]
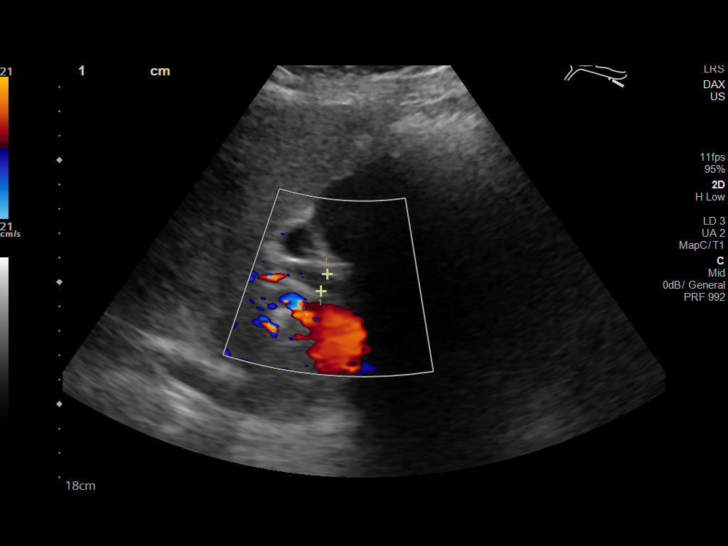
[im 26/41]
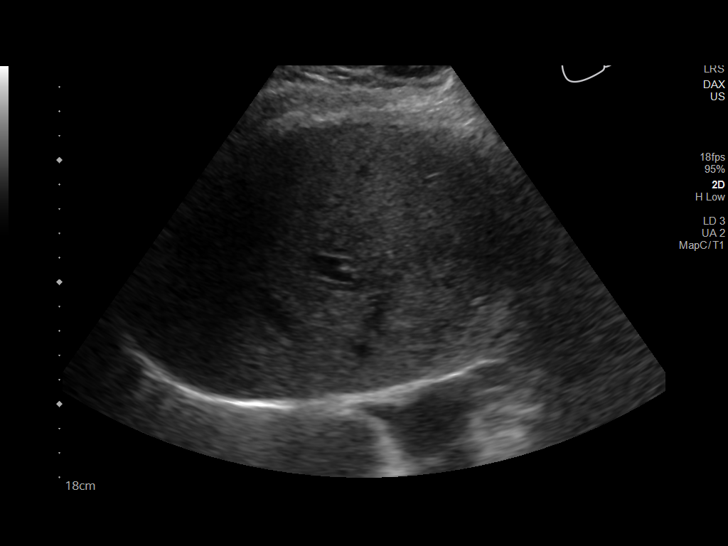
[im 27/41]
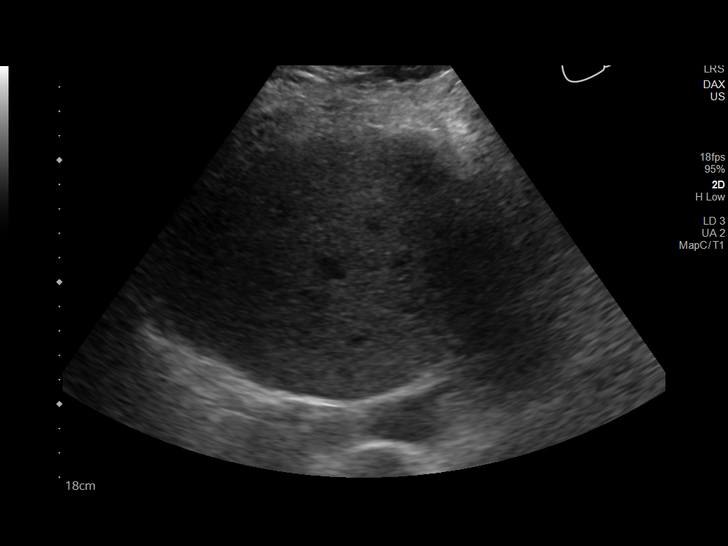
[im 31/41]
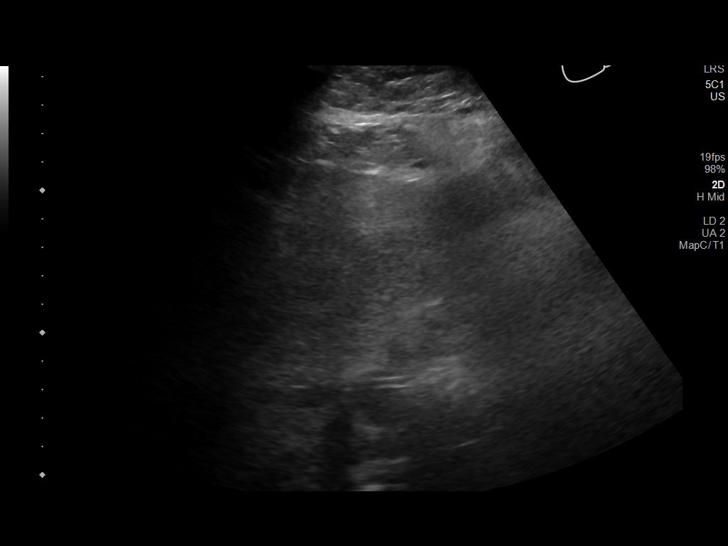
[im 34/41]
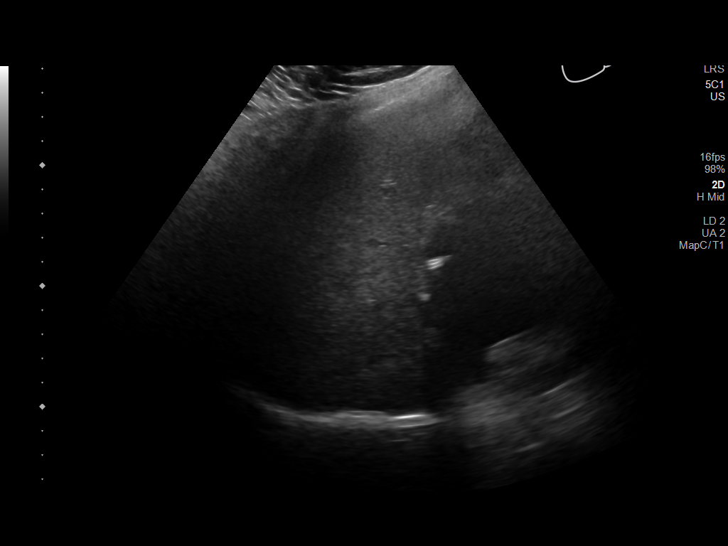
[im 37/41]
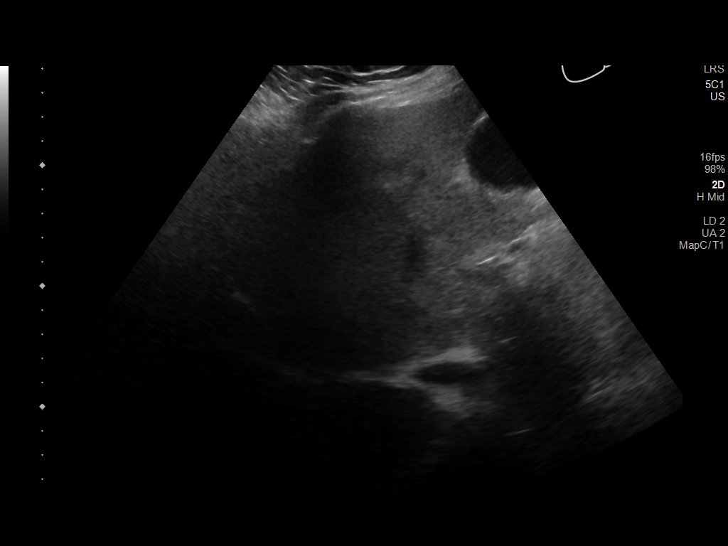
[im 41/41]
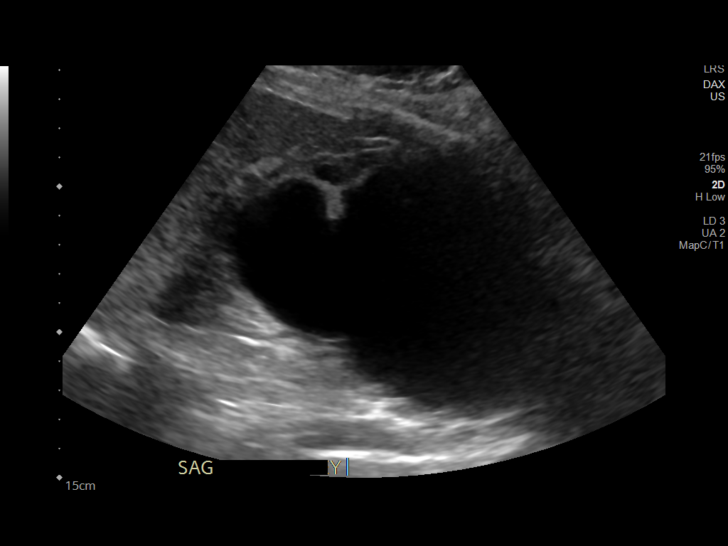

[14 of 25 positions shown; findings below may reference images not displayed]

FINDINGS: Gallbladder:

Gallstones are identified. There is no gallbladder wall thickening.
No sonographic Murphy sign or pericholecystic fluid is identified.

Common bile duct:

Diameter: 7.5 mm.

Liver:

No focal lesion identified. There is diffuse increased echotexture
of the liver. Portal vein is patent on color Doppler imaging with
normal direction of blood flow towards the liver.

Other: Incidental finding of severe right hydronephrosis.
IMPRESSION: Cholelithiasis without sonographic evidence of acute cholecystitis.

Diffuse increased echotexture of the liver, nonspecific, can be seen
in fatty infiltration of liver.

Severe right hydronephrosis.

## 2019-09-30 ENCOUNTER — Encounter (HOSPITAL_BASED_OUTPATIENT_CLINIC_OR_DEPARTMENT_OTHER): Payer: Self-pay | Admitting: Internal Medicine

## 2019-10-01 ENCOUNTER — Telehealth (HOSPITAL_BASED_OUTPATIENT_CLINIC_OR_DEPARTMENT_OTHER): Payer: Self-pay

## 2019-10-01 NOTE — Telephone Encounter (Signed)
Patient phoned into [facility] inquiring about telemedicine appointment with Dr Lebron Quam. Chart reviewed. Patient has telemedicine appointment with Dr Lebron Quam at [place]. Patient provided with phone # [phone] to call regarding telemedicine appointment with Dr Lebron Quam.

## 2019-10-05 ENCOUNTER — Telehealth (INDEPENDENT_AMBULATORY_CARE_PROVIDER_SITE_OTHER): Payer: Medicare HMO | Admitting: Urology

## 2019-10-07 ENCOUNTER — Encounter (HOSPITAL_BASED_OUTPATIENT_CLINIC_OR_DEPARTMENT_OTHER): Payer: Self-pay | Admitting: Internal Medicine

## 2019-10-15 ENCOUNTER — Ambulatory Visit: Payer: Medicare HMO | Attending: Internal Medicine | Admitting: Internal Medicine

## 2019-10-15 ENCOUNTER — Other Ambulatory Visit (HOSPITAL_BASED_OUTPATIENT_CLINIC_OR_DEPARTMENT_OTHER): Payer: Self-pay | Admitting: Internal Medicine

## 2019-10-15 VITALS — BP 136/56 | HR 56 | Temp 98.1°F | Wt 212.6 lb

## 2019-10-15 DIAGNOSIS — L821 Other seborrheic keratosis: Secondary | ICD-10-CM | POA: Insufficient documentation

## 2019-10-15 DIAGNOSIS — Z951 Presence of aortocoronary bypass graft: Secondary | ICD-10-CM | POA: Insufficient documentation

## 2019-10-15 DIAGNOSIS — R310 Gross hematuria: Secondary | ICD-10-CM

## 2019-10-15 DIAGNOSIS — I350 Nonrheumatic aortic (valve) stenosis: Secondary | ICD-10-CM

## 2019-10-15 DIAGNOSIS — R0609 Other forms of dyspnea: Secondary | ICD-10-CM

## 2019-10-15 DIAGNOSIS — H6120 Impacted cerumen, unspecified ear: Secondary | ICD-10-CM

## 2019-10-15 DIAGNOSIS — R001 Bradycardia, unspecified: Secondary | ICD-10-CM | POA: Insufficient documentation

## 2019-10-15 DIAGNOSIS — R0600 Dyspnea, unspecified: Secondary | ICD-10-CM | POA: Insufficient documentation

## 2019-10-15 DIAGNOSIS — I2510 Atherosclerotic heart disease of native coronary artery without angina pectoris: Secondary | ICD-10-CM

## 2019-10-15 DIAGNOSIS — R519 Headache, unspecified: Secondary | ICD-10-CM | POA: Insufficient documentation

## 2019-10-15 LAB — URINALYSIS WITH REFLEX CULTURE
Bacteria, URN: NONE SEEN
Bilirubin (Qual), URN: NEGATIVE
Epith Cells_Renal/Trans,URN: NEGATIVE /HPF
Glucose Qual, URN: NEGATIVE mg/dL
Ketones, URN: NEGATIVE mg/dL
Leukocyte Esterase, URN: POSITIVE — AB
Nitrite, URN: NEGATIVE
Specific Gravity, URN: 1.018 g/mL (ref 1.006–1.027)
pH, URN: 6.5 (ref 5.0–8.0)

## 2019-10-15 LAB — REFLEX CULTURE FOR UA

## 2019-10-15 NOTE — Assessment & Plan Note (Signed)
DSE, cardiology referral

## 2019-10-15 NOTE — Progress Notes (Addendum)
[place] SUBSEQUENT CARE VISIT NOTE     Chief Complaint   Patient presents with    Follow-Up        ID/CC:  Wendy [name] is a 74 year old community-dwelling female following up GERD, depression and other concerns. Last visit with me 07/16/19.     HPI:  Baseline: lives alone at [facility] senior housing [facility])  [place] today by none  Interpreter or not: NO    Interim history:  08/14/19 urology follow up kidney stones, note reviewed  08/24/19 endocrine follow up hyperparathyroidism, note reviewed  Clinical pharmacist BP co-management  Medical nutrition therapy     Concerns today: #1 Blood when urinating past couple days. No pain with urination. Thinks blood coming from different location. Known history of kidney stones related to hyperparathyroidism.    #2 Pressure top of head past couple days +allergies. Ears are sore R>L.    #3 Heartburn: rx PPI by endocrine 3/15. Denies symptoms after eating. Notices mostly when moving around associated with shortness of breath. Occurs fairly quickly. PMH includes CAD s/p CABG 2006 and aortic stenosis.    #4 Mood improved since moving into her own apartment.    #5 Check mole on back.     Patient Active Problem List   Diagnosis    Nephrolithiasis    Hyperparathyroidism ([place])    Osteoporosis without current pathological fracture    Non-functioning R kidney    Essential hypertension    Aortic stenosis, mild    Asthma    Bipolar disorder without psychotic features ([place])    Atherosclerosis of native coronary artery of native heart    Colon polyps    Depression    Generalized anxiety disorder    Mixed hyperlipidemia    Migraine    History of R parathyroid adenoma    History of R parathyroidectomy (2017)    Thyroid disease    Vitamin D deficiency    Hx of CABG    Aortic regurgitation    Pulmonary nodule less than 6 cm determined by computed tomography of lung    Abnormal computed tomography of pelvis    Body mass index 40.0-44.9, adult  (HCC)    History of R metatarsal fractures    Primary osteoarthritis of right knee    Esophageal dysphagia    Gastroesophageal reflux disease without esophagitis         Current Outpatient Medications   Medication Sig Dispense Refill    amLODIPine 10 MG tablet Take 1 tablet (10 mg) by mouth daily. 90 tablet 2    metoprolol succinate ER 50 MG 24 hr tablet Take 1 tablet (50 mg) by mouth daily. 90 tablet 2    pantoprazole 40 MG EC tablet Take 1 tablet (40 mg) by mouth daily. 60 tablet 0    spironolactone 25 MG tablet Take 1 tablet (25 mg) by mouth daily. 90 tablet 3     No current facility-administered medications for this visit.         Review of patient's allergies indicates:  Allergies   Allergen Reactions    Hydrochlorothiazide Skin: Itching and Throat Swelling    Lidocaine Other     Hallucinations    Penicillins Skin: Hives     Reaction in [place] (??)  Has patient had a PCN reaction causing immediate rash, facial/tongue/throat swelling, SOB or lightheadedness with hypotension: Yes  Has patient had a PCN reaction causing severe rash involving mucus membranes or skin necrosis: No  Has patient had a PCN reaction that required hospitalization: No  Has patient had a PCN reaction occurring within the last 10 years: No  If all of the above answers are "NO", then may proceed with Cephalosporin use.      Coconut Fatty Acids      Other reaction(s): Other (See Comments)  Reaction??    Latex Skin: Itching    Losartan Headache and Other     Other reaction(s): Other (See Comments), Unknown  Pt doesn't remember.  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   Administered Date(s) Administered    COVID-19 Moderna mRNA LNP-S PF 07/03/2019, 08/11/2019    Influenza quadrivalent MDCK PF (Flucelvax) 04/18/2019    Influenza quadrivalent PF 07/11/2017    Influenza trivalent high-dose 03/04/2018    Pneumococcal conjugate PCV13 (Prevnar 13) 05/21/2014    Pneumococcal polysaccharide PPSV23 (Pneumovax 23) 11/14/2012    Tdap 11/14/2012       PHYSICAL EXAMINATION:  BP 136/56    Pulse 56    Temp 36.7 C (Temporal)    Wt 96.4 kg (212 lb 9.6 oz)    SpO2 97%    BMI 41.52 kg/m    Wt Readings from Last 3 Encounters:   10/15/19 96.4 kg (212 lb 9.6 oz)   08/24/19 96.9 kg (213 lb 9.6 oz)   07/16/19 95.8 kg (211 lb 4.8 oz)       GENERAL: pleasant older Caucasian female BMI 41. Appearance: well-groomed, stated age, normal affect and in no acute distress.  HEENT: Head: tender area top of head to right of vertex. EARS: R auditory canal cerumen obstructing view of R tympanic membrane but not impacted; L auditory canal moderate cerumen with some mild erythema, L tympanic membrane no erythema  CV: bradycardic rate and regular rhythm, normal S1, S2, 4/6 holosystolic murmur, no NIOE/V0J5  LUNGS: unlabored breathing, clear to auscultation and percussion bilaterally  Neurologic: speech is clear, coherent and fluent; moving all extremities  Skin: mid-thoracic back to left of midline seborrheic keratosis    EKG done but was not printing so reviewed tracing on machine. Appeared to be in normal sinus rhythm or sinus bradycardia. Mild asymmetric ST inversion lead V6 which appears similar to 04/18/2019 EKG    Depression PHQ9 Total Score: 4  GAD7 Score, Total: 1     Labs/Other Results:  Office Visit on 08/24/2019   Component Date Value    Gastrin Result (Sendout) 08/24/2019 16    Telemedicine on 08/14/2019   Component  Date Value    Sodium 08/24/2019 139     Potassium 08/24/2019 3.8     Chloride 08/24/2019 101     Carbon Dioxide, Total 08/24/2019 31     Anion Gap 08/24/2019 7     Glucose 08/24/2019 87     Urea Nitrogen 08/24/2019 22*    Creatinine 08/24/2019 0.99     Calcium 08/24/2019 9.9  eGFR, Calculated 08/24/2019 57*    GFR, Information 08/24/2019 Calculated GFR by CKD-EPI equation. Inaccurate with changing renal function. See http://depts.[email or web address].html.      Lab Results   Component Value Date    IPTH 100 (H) 07/16/2019      ASSESSMENT/PLAN:  1. Dyspnea on exertion: also concern for reflux symptoms as anginal equivalent, known ASCVD s/p CABG multiple vessels  - Echocardiogram dobutamine stress test; Future since likely would have difficulty walking on treadmill  - EKG 12-Lead; Future  - Referral to Cardiology; Future geriatric cardiology preferred    2. Aortic stenosis, mild: per outside records; if progressed may be contributing to dyspnea on exertion  - Echocardiogram dobutamine stress test; Future  - EKG 12-Lead; Future  - Referral to Cardiology; Future    3. Atherosclerosis of native coronary artery of native heart without angina pectoris: on beta-blocker, doesn't tolerate statins  - Echocardiogram dobutamine stress test; Future  - EKG 12-Lead; Future  - Referral to Cardiology; Future    4. Gross hematuria: no dysuria  - Urinalysis with Reflex Culture; Future    5. Nonintractable headache, unspecified chronicity pattern, unspecified headache type: "pressure" top of head past few days  - Will monitor    6. Cerumen in auditory canal on examination  - May try OTC carbamide peroxide (Debrox) drops per package directions as needed for cerumen    7. Seborrheic keratosis: thoracic back reassured benign    8. Hx of CABG: multiple vessel outside 2006  - Echocardiogram dobutamine stress test; Future  - EKG 12-Lead; Future  - Referral to Cardiology; Future      Relevant Health Care  Maintenance:  - Labs: pending labs per endocrine; lipids 07/16/19; TSH normal 06/23/19; 25-OH vitamin D normal 07/16/19; vitamin B-12 normal 03/04/18 ([facility])  - Supplements: none  - Immunizations: discuss recombinant zoster vaccine at future appointment  - Osteoporosis screening (DEXA): indicated N/A known osteoporosis; last DEXA 07/16/19 next due 07/2021  - Abdominal aortic aneurysm screening: indicated NO (USPSTF guideline recommends against screening females who never smoked)  - Colon cancer screening: indicated YES surveillance colon polyp due this year  - Mammogram/breast cancer screening: indicated YES plan this year (previously deferred due to COVID-19 pandemic)    Advance care planning/POLST: address at future appointment, [facility] daughter Roanna Epley    Return in about 6 weeks (around 11/26/2019).     I spent a total of 47 minutes for the patient's care on the date of the service.       ADDENDUM 10/20/19  Orders Only on 10/15/19   1. Urinalysis with Reflex Culture    Specimen: Urine   Result Value Ref Range    Color, URN Yellow     Clarity, URN Clear     Specific Gravity, URN 1.018 1.006 - 1.027 g/mL    pH, URN 6.5 5.0 - 8.0    Protein (Alb Semiquant), URN Trace (A) NRN mg/dL    Glucose Qual, URN Negative NRN mg/dL    Ketones, URN Negative NRN mg/dL    Bilirubin (Qual), URN Negative NRN    Occult Blood, URN 2+ (A) NRN    Nitrite, URN Negative NRN    Leukocyte Esterase, URN Positive (A) NRN    Urobilinogen, URN 6.1-6.0 URONML [Ehrlich'U]    Comments for Macroscopic, URN None     Collection Method Information not provided     WBC, URN >50(4+) (A) Z5NEG /[HPF]    RBC, URN 3-5 (1+) (A) Z2NEG /[HPF]    Bacteria,  URN Not Seen NOSEEN    Epith Cells_Squamous, URN >5(PRESENT) (A) LT6 /[LPF]    Epith Cells_Renal/Trans,URN <3(NEG) VVOH6 /[HPF]    Comments For Microscopic, URN None NONE    Mucus, URN Present (A) NOSEEN /[LPF]    Crystal_Calcium Oxalate, URN 4+ /[LPF]    1st Extra Urine [facility] Rx Additional collection tube    2.  Reflex Culture for UA    Specimen: Urine   Result Value Ref Range    Reflex Culture for UA Order Processed    3. Culture Urine, Bact    Specimen: Urine    Reflexive~Note: Collection method not provided.  See the [facility] Lab Medicine Online Laboratory Test Catalog for specimen collection information.~Gray top tube   Result Value Ref Range    Special Requests None     Culture (A)      >100,000 Col/mL  Mixed flora including Gram negative rods     Office Visit on 10/15/19   4. EKG 12-Lead   Result Value Ref Range    Ventricular Rate 50 BPM    Atrial Rate 50 BPM    P-R Interval 210 ms    QRS Duration 86 ms    Q-T Interval 464 ms    QTC Calculation 423 ms    P Axis 72 degrees    R Axis 85 degrees    T Axis 60 degrees    Diagnosis       Sinus bradycardia with 1st degree AV block  Nonspecific T wave abnormality  Abnormal ECG  When compared with ECG of 18-Apr-2019 14:08,  Criteria for Septal infarct are no longer present  Confirmed by Throop MD, FARID ([id number]) on 10/16/2019 11:22:54 PM       U/A not clean catch. MyChart result comment sent to follow up if continued symptoms. Urology follow up next week.    Sinus bradycardia and 1st degree AV block related to beta-blocker.

## 2019-10-15 NOTE — Patient Instructions (Signed)
Thank you for choosing [facility] for your care. You saw Ether Griffins, MD, MPH today at [facility]. Please call [phone] option #2 with any urgent questions or concerns.    Shortness of breath, heartburn feeling: dobutamine stress echocardiogram test to be scheduled    Cardiology referral ordered    Blood with urination: urinalysis test ordered    Ears: some wax in both (right more than left). Left ear canal skin looks irritated. Can try Debrox drops per package directions for up to 4 days.

## 2019-10-16 LAB — EKG 12 LEAD
Atrial Rate: 50 {beats}/min
P Axis: 72 degrees
P-R Interval: 210 ms
Q-T Interval: 464 ms
QRS Duration: 86 ms
QTC Calculation: 423 ms
R Axis: 85 degrees
T Axis: 60 degrees
Ventricular Rate: 50 {beats}/min

## 2019-10-16 NOTE — Addendum Note (Signed)
Addended by: Honor Loh on: 10/16/2019 08:41 AM     Modules accepted: Orders

## 2019-10-17 LAB — URINE C/S: Culture: >100000 — AB

## 2019-10-23 ENCOUNTER — Encounter (INDEPENDENT_AMBULATORY_CARE_PROVIDER_SITE_OTHER): Payer: Self-pay | Admitting: Urology

## 2019-10-26 ENCOUNTER — Telehealth (INDEPENDENT_AMBULATORY_CARE_PROVIDER_SITE_OTHER): Payer: Medicare HMO | Admitting: Urology

## 2019-10-26 DIAGNOSIS — N289 Disorder of kidney and ureter, unspecified: Secondary | ICD-10-CM

## 2019-10-26 DIAGNOSIS — N200 Calculus of kidney: Secondary | ICD-10-CM
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
## 2019-10-26 NOTE — Patient Instructions (Addendum)
Litholink and renal ultrasound in 6 months.    F/u in 7 months.      Recommendations for increasing dietary citrate:  - For economical options for getting more lemon juice: Consider buying "Real Lemon" lemon concentrate from [employer]  - Mix 4 ounces of lemon juice in 1500 ml of water. This is equivalent to 30 mEq of potassium citrate   - Add crystal light lemonade to water. This is equivalent to 20 mEq of potassium citrate  - Add the juice of lemons to your food and drinks.   - Increase fruits and vegetables in your diet.     We recommend litholyte 75meq tid with food.  Will mail you a coupon.    Can be ordered online.  [email or web address]

## 2019-10-30 ENCOUNTER — Telehealth (HOSPITAL_BASED_OUTPATIENT_CLINIC_OR_DEPARTMENT_OTHER): Payer: Self-pay | Admitting: Cardiovascular Disease

## 2019-10-30 NOTE — Telephone Encounter (Signed)
RETURN CALL: Voicemail - Detailed Message      SUBJECT:  Appointment Request     REASON FOR VISIT: Referral # [id number]  PREFERRED DATE/TIME: Asap  ADDITIONAL INFORMATION: Patient requests to schedule appointment with Dr. Bridgett Larsson asap.

## 2019-11-02 NOTE — Telephone Encounter (Signed)
Called patient  to schedule an appointment and left VM to call back [phone].

## 2019-11-03 ENCOUNTER — Ambulatory Visit
Admission: RE | Admit: 2019-11-03 | Discharge: 2019-11-03 | Disposition: A | Discharge status: Home / Self Care | Payer: Medicare HMO | Attending: Internal Medicine | Admitting: Internal Medicine

## 2019-11-03 ENCOUNTER — Telehealth (HOSPITAL_COMMUNITY): Payer: Self-pay

## 2019-11-03 DIAGNOSIS — I350 Nonrheumatic aortic (valve) stenosis: Secondary | ICD-10-CM | POA: Insufficient documentation

## 2019-11-03 DIAGNOSIS — R0600 Dyspnea, unspecified: Secondary | ICD-10-CM

## 2019-11-03 DIAGNOSIS — I2510 Atherosclerotic heart disease of native coronary artery without angina pectoris: Secondary | ICD-10-CM | POA: Insufficient documentation

## 2019-11-03 DIAGNOSIS — R9439 Abnormal result of other cardiovascular function study: Secondary | ICD-10-CM

## 2019-11-03 DIAGNOSIS — R0609 Other forms of dyspnea: Secondary | ICD-10-CM

## 2019-11-03 DIAGNOSIS — Z951 Presence of aortocoronary bypass graft: Secondary | ICD-10-CM | POA: Insufficient documentation

## 2019-11-03 HISTORY — DX: Abnormal result of other cardiovascular function study: R94.39

## 2019-11-03 LAB — ECHOCARDIOGRAM DOBUTAMINE STRESS TEST
AoV max: 305.5 cm/s
AoV mean PG: 24 mmHg
Ascending aorta: 3.6 cm
EF: 64.9 %
IVSd: 0.91 cm
LV Diastolic Volume Index (BP): 121.4 ml
LV Systolic Volume (BP): 42.6 ml
LVIDd: 5 cm
LVIDs: 3.3 cm
LVPWd: 1 cm
RVDd: 2.4 cm
RVDd: 2.4 cm

## 2019-11-03 MED ORDER — DOBUTAMINE-DEXTROSE 1-5 MG/ML-% IV SOLN
0.0000 ug/kg/min | INTRAVENOUS | Status: DC
Start: 2019-11-03 — End: 2019-11-04
  Administered 2019-11-03: 10 ug/kg/min via INTRAVENOUS

## 2019-11-03 MED ORDER — PERFLUTREN LIPID MICROSPHERE 6.52 MG/ML IV SUSP
INTRAVENOUS | Status: AC
Start: 2019-11-03 — End: ?
  Filled 2019-11-03: qty 2

## 2019-11-03 MED ORDER — METOPROLOL TARTRATE 5 MG/5ML IV SOLN
INTRAVENOUS | Status: AC
Start: 2019-11-03 — End: ?
  Filled 2019-11-03: qty 5

## 2019-11-03 MED ORDER — NITROGLYCERIN 0.4 MG SL SUBL
0.4000 mg | SUBLINGUAL_TABLET | SUBLINGUAL | Status: DC | PRN
Start: 2019-11-03 — End: 2019-11-04

## 2019-11-03 MED ORDER — ATROPINE SULFATE 1 MG/ML IJ SOLN
INTRAMUSCULAR | Status: AC
Start: 2019-11-03 — End: ?
  Filled 2019-11-03: qty 1

## 2019-11-03 MED ORDER — DOBUTAMINE-DEXTROSE 1-5 MG/ML-% IV SOLN
INTRAVENOUS | Status: AC
Start: 2019-11-03 — End: ?
  Filled 2019-11-03: qty 250

## 2019-11-03 MED ORDER — ATROPINE SULFATE 1 MG/ML IJ SOLN
0.2500 mg | INTRAMUSCULAR | Status: DC | PRN
Start: 2019-11-03 — End: 2019-11-04
  Administered 2019-11-03: 0.25 mg via INTRAVENOUS

## 2019-11-03 MED ORDER — METOPROLOL TARTRATE 5 MG/5ML IV SOLN
5.0000 mg | INTRAVENOUS | Status: DC | PRN
Start: 2019-11-03 — End: 2019-11-04
  Administered 2019-11-03: 5 mg via INTRAVENOUS

## 2019-11-03 MED ORDER — SODIUM CHLORIDE (PF) 0.9 % IJ SOLN
1.0000 mL | Freq: Once | INTRAVENOUS | Status: DC | PRN
Start: 2019-11-03 — End: 2019-11-04
  Administered 2019-11-04: 4 mL via INTRAVENOUS

## 2019-11-03 MED ORDER — SODIUM CHLORIDE 0.9 % IV SOLN
50.0000 mL/h | INTRAVENOUS | Status: DC
Start: 2019-11-03 — End: 2019-11-04
  Filled 2019-11-03: qty 1000

## 2019-11-03 NOTE — Nursing Note (Signed)
Peak HR target (126) reached during DSE. Pt complained of chest pain, shortness of breath, and dizziness during the procedure. 5mg  Metoprolol given at the end of procedure. VS rechecked, stable, back to baseline. Pt denies feeling any other symptoms. Pt discharged in ambulatory condition. IV discontinued.

## 2019-11-04 ENCOUNTER — Encounter (HOSPITAL_BASED_OUTPATIENT_CLINIC_OR_DEPARTMENT_OTHER): Payer: Self-pay | Admitting: Internal Medicine

## 2019-11-05 ENCOUNTER — Telehealth (HOSPITAL_BASED_OUTPATIENT_CLINIC_OR_DEPARTMENT_OTHER): Payer: Self-pay | Admitting: Internal Medicine

## 2019-11-05 ENCOUNTER — Encounter (HOSPITAL_BASED_OUTPATIENT_CLINIC_OR_DEPARTMENT_OTHER): Payer: Self-pay | Admitting: Internal Medicine

## 2019-11-05 DIAGNOSIS — I10 Essential (primary) hypertension: Secondary | ICD-10-CM

## 2019-11-05 NOTE — Telephone Encounter (Signed)
T/C to Wendy [name] to follow up abnormal DSE 5/25 which showed ischemia. She reports no change in her symptoms (dyspnea on exertion and GERD) with light activity usually walking. No symptoms at rest. Currently scheduled to see cardiology 6/3 but if earlier appointment available she would be happy to come in. Will check with cardiology.    Reviewed if symptoms worsen or starts noticing at rest to seek urgent medical attention.    Expresses her appreciation for staff at [facility], positive experience during DSE 5/25.

## 2019-11-05 NOTE — Telephone Encounter (Signed)
S/O: Abnormal dobutamine stress echocardiogram, stable anginal symptoms (dyspnea on exertion, heartburn feeling). Scheduled w/Dr Bridgett Larsson cardiology 6/3.    A/P: [facility] RN: please let Ms. Manry know that Dr Bridgett Larsson unfortunately doesn't have an earlier appointment than 6/3 so keep appointment as scheduled. If develops worsening symptoms and/or symptoms at rest then seek urgent medical attention.    Thanks.

## 2019-11-06 ENCOUNTER — Other Ambulatory Visit (HOSPITAL_BASED_OUTPATIENT_CLINIC_OR_DEPARTMENT_OTHER): Payer: Self-pay | Admitting: Pharmacist

## 2019-11-06 MED ORDER — METOPROLOL SUCCINATE ER 50 MG OR TB24
50.0000 mg | EXTENDED_RELEASE_TABLET | Freq: Every day | ORAL | 2 refills | Status: DC
Start: 2019-11-06 — End: 2020-06-28

## 2019-11-06 MED ORDER — AMLODIPINE BESYLATE 10 MG OR TABS
10.0000 mg | ORAL_TABLET | Freq: Every day | ORAL | 2 refills | Status: DC
Start: 2019-11-06 — End: 2020-07-28

## 2019-11-06 MED ORDER — SPIRONOLACTONE 25 MG OR TABS
25.0000 mg | ORAL_TABLET | Freq: Every day | ORAL | 3 refills | Status: DC
Start: 2019-11-06 — End: 2020-06-28

## 2019-11-06 NOTE — Telephone Encounter (Signed)
Outgoing call:    RE: earlier cardiology appt    Spoke with pt relaying Dr. Allayne Gitelman message:    please let Ms. Canto know that Dr Bridgett Larsson unfortunately doesn't have an earlier appointment than 6/3 so keep appointment as scheduled. If develops worsening symptoms and/or symptoms at rest then seek urgent medical attention.    Thanks.    Charyl Dancer, RN  [facility]   [facility]

## 2019-11-12 ENCOUNTER — Ambulatory Visit: Payer: Medicare HMO | Attending: Cardiovascular Disease | Admitting: Cardiovascular Disease

## 2019-11-12 ENCOUNTER — Encounter (HOSPITAL_BASED_OUTPATIENT_CLINIC_OR_DEPARTMENT_OTHER): Payer: Self-pay | Admitting: Cardiovascular Disease

## 2019-11-12 VITALS — BP 146/56 | HR 65 | Temp 97.0°F | Wt 213.6 lb

## 2019-11-12 DIAGNOSIS — I2510 Atherosclerotic heart disease of native coronary artery without angina pectoris: Secondary | ICD-10-CM | POA: Insufficient documentation

## 2019-11-12 DIAGNOSIS — R0600 Dyspnea, unspecified: Secondary | ICD-10-CM | POA: Insufficient documentation

## 2019-11-12 DIAGNOSIS — I350 Nonrheumatic aortic (valve) stenosis: Secondary | ICD-10-CM | POA: Insufficient documentation

## 2019-11-12 DIAGNOSIS — Z789 Other specified health status: Secondary | ICD-10-CM | POA: Insufficient documentation

## 2019-11-12 DIAGNOSIS — R0609 Other forms of dyspnea: Secondary | ICD-10-CM | POA: Insufficient documentation

## 2019-11-12 DIAGNOSIS — Z951 Presence of aortocoronary bypass graft: Secondary | ICD-10-CM | POA: Insufficient documentation

## 2019-11-12 MED ORDER — NITROGLYCERIN 0.4 MG SL SUBL
0.4000 mg | SUBLINGUAL_TABLET | Freq: Once | SUBLINGUAL | Status: DC
Start: 2019-11-12 — End: 2019-11-13

## 2019-11-12 NOTE — Progress Notes (Signed)
Cardiology Consultation    Reason/Source of Consultation: asked by dr huang to see regarding recent positive stress echo & symptoms of DOE.    HISTORY OF PRESENT ILLNESS:  Wendy [name] is a 74 y/o W with h/o CAD, s/p CABG (2006.  LIMA-LAD, SVG-dRCA (with endarterectomy of RPDA and PL), SeqSVG-RI-D1, SVG-D3).  Back in 2006 she had DOE and was worked up for CAD, ultimately had a cath and then CABG.  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  She has a h/o statin intolerance.  More recently she has developed DOE and has been limiting her activity.  As a result her PCP, Dr. Huang, ordered a DSE.  This showed inducible ischemia and reproduced her symptoms.  See formal report. She is here to discuss.  Currently she denies, orthopnea/PND/LEE, syncope, pre-syncope, or palpitations.  Of note her daughter is set to be married on the 19th.      Wendy [name] came to her appt with her son.    PAST MEDICAL HISTORY/PROBLEM LIST:  Patient Active Problem List    Diagnosis Date Noted   • Abnormal dobutamine stress echocardiogram [R94.39] 11/03/2019   • Dyspnea on exertion [R06.00] 11/12/2019     Added automatically from request for surgery [id number]     • Esophageal dysphagia [R13.10] 08/24/2019   • Gastroesophageal reflux disease without esophagitis [K21.9] 08/24/2019   • Body mass index 40.0-44.9, adult (HCC) [Z68.41] 07/16/2019   • Primary osteoarthritis of right knee [M17.11] 07/16/2019   • Hx of CABG [Z95.1]    • Essential hypertension [I10] 06/08/2019   • Asthma [J45.909] 06/08/2019   • Bipolar disorder without psychotic features (HCC) [F31.9] 06/08/2019   • Atherosclerosis of native coronary artery of native heart [I25.10] 06/08/2019     Multiple vessel 2006     • Colon polyps [K63.5] 06/08/2019   • Depression [F32.9] 06/08/2019   • Generalized anxiety disorder [F41.1] 06/08/2019   • Migraine [G43.909] 06/08/2019   • Thyroid disease [E07.9]  06/08/2019   • Vitamin D deficiency [E55.9] 06/08/2019   • Mixed hyperlipidemia [E78.2]      Cannot tolerate statins     • Hyperparathyroidism (HCC) [E21.3]    • Osteoporosis without current pathological fracture [M81.0]    • Nephrolithiasis [N20.0] 04/27/2019   • Non-functioning R kidney [N28.9] 01/22/2019     Due to ureteral stone     • Pulmonary nodule less than 6 cm determined by computed tomography of lung [R91.1] 01/09/2019     R lung 5 mm nodule incidental finding ([place])     • Abnormal computed tomography of pelvis [R93.5] 01/09/2019     Probable fibroid     • History of R parathyroidectomy (2017) [Z90.09] 02/07/2016   • Aortic stenosis, mild [I35.0] 01/08/2016     02/25/18 TTE Mild aortic stenosis with mild-moderate aortic regurgitation     • Aortic regurgitation [I35.1] 01/08/2016     02/25/18 TTE mild-moderate     • 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   • History of R metatarsal fractures [Z87.81] 2017     2017 R 2nd, 3rd, 4th proximal metatarsals         SOCIAL HISTORY:  Social History     Socioeconomic History   • Marital status: Divorced     Spouse name: Not on file   • Number of children: Not on file   • Years of education: Not on file   •   Highest education level: Not on file   Occupational History   • Not on file   Social Needs   • Financial resource strain: Not on file   • Food insecurity     Worry: Not on file     Inability: Not on file   • Transportation needs     Medical: Not on file     Non-medical: Not on file   Tobacco Use   • Smoking status: Never Smoker   • Smokeless tobacco: Never Used   Substance and Sexual Activity   • Alcohol use: Not Currently   • Drug use: Not Currently   • Sexual activity: Not on file   Lifestyle   • Physical activity     Days per week: Not on file     Minutes per session: Not on file   • Stress: Not on file   Relationships   • Social connections     Talks on phone: Not on file     Gets together: Not on file     Attends religious service: Not on  file     Active member of club or organization: Not on file     Attends meetings of clubs or organizations: Not on file     Relationship status: Not on file   • Intimate partner violence     Fear of current or ex partner: Not on file     Emotionally abused: Not on file     Physically abused: Not on file     Forced sexual activity: Not on file   Other Topics Concern   • Not on file   Social History Narrative    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 Originally from [place]. 2 children. Enjoys tai chi.   Lifelong non smoker.    FAMILY HISTORY:  Family History     Problem (# of Occurrences) Relation (Name,Age of Onset)    Ankylosing Spondylitis (1) Maternal Uncle    Arthritis (1) Maternal Grandmother    Bipolar Disorder (2) Mother: Per outside records, Brother: Per outside records    Brain Cancer (1) Sister    Breast Cancer (1) Paternal Aunt    Hypertension (2) Sister: Per outside records, Mother    Myocardial Infarction (1) Father (43)       Negative family history of: Osteoporosis, Stroke          ALL:  Review of patient's allergies indicates:  Allergies   Allergen Reactions   • Hydrochlorothiazide Skin: Itching and Throat Swelling   • Lidocaine Other     Hallucinations   • Penicillins Skin: Hives     Reaction in [place] (??)  Has patient had a PCN reaction causing immediate rash, facial/tongue/throat swelling, SOB or lightheadedness with hypotension: Yes  Has patient had a PCN reaction causing severe rash involving mucus membranes or skin necrosis: No  Has patient had a PCN reaction that required hospitalization: No  Has patient had a PCN reaction occurring within the last 10 years: No  If all of the above answers are "NO", then may proceed with Cephalosporin use.     • Coconut Fatty Acids      Other reaction(s): Other (See Comments)  Reaction??   • Latex Skin: Itching   • Losartan Headache and Other     Other reaction(s): Other (See Comments), Unknown  Pt doesn't remember.  High  blood pressure   • Statins Headache and Other       Other reaction(s): Other (See Comments), Other (See Comments) high blood pressure       MEDS:  Outpatient Medications Prior to Visit   Medication Sig Dispense Refill   • amLODIPine 10 MG tablet Take 1 tablet (10 mg) by mouth daily. 90 tablet 2   • metoprolol succinate ER 50 MG 24 hr tablet Take 1 tablet (50 mg) by mouth daily. 90 tablet 2   • pantoprazole 40 MG EC tablet Take 1 tablet (40 mg) by mouth daily. 60 tablet 0   • spironolactone 25 MG tablet Take 1 tablet (25 mg) by mouth daily. 90 tablet 3     No facility-administered medications prior to visit.        PHYSICAL EXAM:  Vitals:    11/12/19 1513   BP: (!) 146/56   BP Cuff Size: Regular Long   BP Site: Left Arm   BP Position: Sitting   Pulse: 65   Temp: 36.1 °C   TempSrc: Temporal   SpO2: 96%   Weight: 96.9 kg (213 lb 9.6 oz)     Gen: WD, WN woman, NAD  E: Anicteric  ENMT: MMM  CV: RRR 2/6 SEM at the base, PMI not displaced, JVP not elevated, no LEE  Resp: CTA, B  GI: Soft, +BS  MS: Normal B & T  Neuro: Grossly nonfocal  P: Normal M & A  S: No rashes    OTHER DATA:    Results for orders placed or performed in visit on 08/14/19   Basic Metabolic Panel   Result Value Ref Range    Sodium 139 135 - 145 meq/L    Potassium 3.8 3.6 - 5.2 meq/L    Chloride 101 98 - 108 meq/L    Carbon Dioxide, Total 31 22 - 32 meq/L    Anion Gap 7 4 - 12    Glucose 87 62 - 125 mg/dL    Urea Nitrogen 22 (H) 8 - 21 mg/dL    Creatinine 0.99 0.38 - 1.02 mg/dL    Calcium 9.9 8.9 - 10.2 mg/dL    eGFR, Calculated 57 (L) >59 mL/min/[1.73_m2]    GFR, Information       Calculated GFR by CKD-EPI equation. Inaccurate with changing renal function. See http://[email or web address]/[email or web address]/bclim/cGFR.html.     DSE 11/03/2019:  Conclusion  1. Patient developed chest pain at peak stress, described to be similar to  the pain she felt before having her CABG, that resolved during recovery.   2. Baseline ECG shows normal sinus rhythm with nonspecific  ST/T-wave changes.  At peak stress, there are inferior (III, aVF) ST elevations with reciprocal  ST depressions in leads I and aVL.   3. Baseline TTE shows normal LV size and systolic function (biplane EF 65%)  with no regional wall motion abnormalities. There is mild mitral annular  calcification, mild-moderate aortic stenosis, and mild-moderate aortic  regurgitation.  4. At peak stress, there is hypokinesis to akinesis in the myocardial  segments as diagrammed.   5. In summary, there is ECG and echocardiographic evidence of inducible  ischemia.     Findings communicated to ordering provider.    IMPRESSION/RECOMMENDATIONS: 73 y/o W with CAD s/p CABG with recurrent anginal equivalent DOE and abnormal DSE.      1. Plan cath to evaluate for intervenable lesions, particularly native Cx/graft to same.  2. Asa consistently daily.  3. Referral to lipid clinic for consideration of PCSK-9 inhibitor.  Contrary to OSH notes, she is amenable to this.  4. Sl.   NTG Rx given.  5. Cath ordered at [facility] Ash, MD (sent email "head's up").  6. RTC ~2 mo, pending cath.  May need post cath check.        I was pleased to meet Wendy [name] and her son and to participate in her care.    I spent a total of 70 minutes for the patient's care on the date of the service, which included face to face time, record review (OSH, our DSE) and care coordination (pre-cath).

## 2019-11-12 NOTE — Patient Instructions (Addendum)
1) Baby Aspirin 81mg  po daily  2) I will refer you for a heart cath at [facility].  They will call you to schedule.  3) I will also refer you to the Lipid clinic for consideration of alternatives to Statins.  4) I have prescribed sublingual nitroglycerin to take in the case of increased shortness of breath or chest pain/pressue.

## 2019-11-13 ENCOUNTER — Encounter (HOSPITAL_BASED_OUTPATIENT_CLINIC_OR_DEPARTMENT_OTHER): Payer: Self-pay | Admitting: Cardiovascular Disease

## 2019-11-13 MED ORDER — NITROGLYCERIN 0.4 MG SL SUBL
0.4000 mg | SUBLINGUAL_TABLET | SUBLINGUAL | 6 refills | Status: DC | PRN
Start: 2019-11-13 — End: 2020-06-28

## 2019-11-15 ENCOUNTER — Encounter (HOSPITAL_BASED_OUTPATIENT_CLINIC_OR_DEPARTMENT_OTHER): Payer: Self-pay | Admitting: Cardiovascular Disease

## 2019-11-16 ENCOUNTER — Other Ambulatory Visit (HOSPITAL_COMMUNITY): Payer: Medicare HMO

## 2019-11-16 ENCOUNTER — Telehealth (HOSPITAL_BASED_OUTPATIENT_CLINIC_OR_DEPARTMENT_OTHER): Payer: Self-pay | Admitting: Cardiovascular Disease

## 2019-11-16 ENCOUNTER — Encounter (HOSPITAL_BASED_OUTPATIENT_CLINIC_OR_DEPARTMENT_OTHER): Payer: Self-pay | Admitting: Cardiovascular Disease

## 2019-11-16 ENCOUNTER — Encounter (HOSPITAL_BASED_OUTPATIENT_CLINIC_OR_DEPARTMENT_OTHER): Payer: Self-pay | Admitting: Internal Medicine

## 2019-11-16 DIAGNOSIS — J309 Allergic rhinitis, unspecified: Secondary | ICD-10-CM

## 2019-11-16 MED ORDER — FEXOFENADINE HCL 180 MG OR TABS
180.0000 mg | ORAL_TABLET | Freq: Every day | ORAL | 2 refills | Status: DC
Start: 2019-11-16 — End: 2020-06-28

## 2019-11-16 NOTE — Telephone Encounter (Signed)
S/O: Nasal congestion, cough, sinus pain, wheezing, fatigue; patient concern for allergies though note symptoms also concerning for COVID-19. Completed COVID-19 vaccination series earlier this year.    Note she is also having stable angina symptoms, pending cardiac catheterization scheduling.    A/P: [facility] RN: please call to triage (I am answering this message after business hours). May need urgent care evaluation and COVID-19 testing. Lives in [place] area so I believe closest COVID-19 testing site may be [employer] and closest urgent care is likely [facility].    Will also send MyChart reply with website of [facility] COVID-19 testing sites.    Thanks.

## 2019-11-16 NOTE — Telephone Encounter (Signed)
Called pt to clarify her request. Pt stated she was referred outside but was unsure what it was for. After reviewing a few of pt's eCare message and last OV, asked if pt was asking about referral to cardiac cath lab for stent procedure. Pt confirmed. Writer was unable to see referral for this but did find it under Other Orders. Called Cardiology and was transferred to Dr. Angelyn Punt scheduler [place]. Left VM informing her that pt would like an update on referral status and relayed pt's request to have it done asap, preferably before 5/19 as she will be attending her dtr's wedding. Requested call back to either writer or pt, #s provided.

## 2019-11-16 NOTE — Telephone Encounter (Signed)
RETURN CALL: Voicemail - Detailed Message      SUBJECT:  General Message     MESSAGE: Patient calling for status of outside referral to cardiology.  Please contact to discuss.  Thank you.

## 2019-11-17 ENCOUNTER — Telehealth (HOSPITAL_BASED_OUTPATIENT_CLINIC_OR_DEPARTMENT_OTHER): Payer: Self-pay

## 2019-11-17 ENCOUNTER — Other Ambulatory Visit (HOSPITAL_BASED_OUTPATIENT_CLINIC_OR_DEPARTMENT_OTHER): Payer: Self-pay | Admitting: Cardiovascular Disease

## 2019-11-17 ENCOUNTER — Encounter (HOSPITAL_COMMUNITY): Payer: Self-pay | Admitting: Cardiovascular Disease

## 2019-11-17 ENCOUNTER — Encounter (HOSPITAL_BASED_OUTPATIENT_CLINIC_OR_DEPARTMENT_OTHER): Payer: Self-pay | Admitting: Internal Medicine

## 2019-11-17 ENCOUNTER — Ambulatory Visit: Payer: Medicare HMO | Attending: Internal Medicine

## 2019-11-17 ENCOUNTER — Inpatient Hospital Stay: Admit: 2019-11-17 | Discharge: 2019-11-17 | Disposition: A | Discharge status: Home / Self Care | Payer: Medicare HMO

## 2019-11-17 ENCOUNTER — Other Ambulatory Visit: Payer: Self-pay

## 2019-11-17 ENCOUNTER — Telehealth (HOSPITAL_COMMUNITY): Payer: Self-pay

## 2019-11-17 ENCOUNTER — Ambulatory Visit (HOSPITAL_COMMUNITY): Payer: Self-pay

## 2019-11-17 ENCOUNTER — Other Ambulatory Visit (HOSPITAL_BASED_OUTPATIENT_CLINIC_OR_DEPARTMENT_OTHER): Payer: Self-pay | Admitting: Laboratory Services

## 2019-11-17 DIAGNOSIS — Z01812 Encounter for preprocedural laboratory examination: Secondary | ICD-10-CM

## 2019-11-17 DIAGNOSIS — Z20822 Contact with and (suspected) exposure to covid-19: Secondary | ICD-10-CM

## 2019-11-17 DIAGNOSIS — M8000XS Age-related osteoporosis with current pathological fracture, unspecified site, sequela: Secondary | ICD-10-CM

## 2019-11-17 LAB — COVID-19 CORONAVIRUS QUALITATIVE PCR: COVID-19 Coronavirus Qual PCR Result: NOT DETECTED

## 2019-11-17 NOTE — Telephone Encounter (Signed)
As of 1235 the prcd is still pending per FACT team.  If no movement I will call the patient to let them know what the status is.

## 2019-11-17 NOTE — Telephone Encounter (Signed)
Caller: Wendy [name]  Relationship to patient: self  Preferred Phone #: [phone]  OK to leave detailed voicemail msg: NO  Subject:    Message:      Scheduled the patient for tomorrow as the second case check in at 0830 and prcd time at 1030.  The C19 test will be done at [facility] today 11/17/2019 at 1030.  Notified Margie and Haynes Dage and Quita Skye for the add on.

## 2019-11-17 NOTE — Telephone Encounter (Signed)
Phone Triage - RN  CC: Congestion  Caller is Shahd Occhipinti 74 year old female reporting     CO:  Stuffy nose, cough, sinus pain, wheezing, tired  Feeling "slighlty" better today  Getting COVID test currently for procedure tomorrow (6/8)  Will go to urgent care if s/s do not improve  RN gave address to closest urgent care  Pt will reach back out to clinic if she ends up going to urgent care  Pt has increased fluid intake   Plans to "lay low" until tomorrows procedure    Onset: 3-4 days  Fever, documented (>100F): NO  Onset more than 7 days: NO    PLAN:  Advice given. Continue through the day and see if symptoms improve. Patient advised to will clinic if conditions change or worsen. Patient agrees. Complete.    Daryll Drown, RN   [facility]   Medical Specialties

## 2019-11-17 NOTE — Telephone Encounter (Signed)
The daughter Hulan Saas called back asking for information since she is bringing her mom tomorrow.  She stated that the her mom has not received a phone call yet. I also asked her to check her moms email as I sent the information on her regular email.  Please review they have not been contacted yet.

## 2019-11-17 NOTE — Progress Notes (Signed)
Patient was seen today at the [facility] COVID-19 testing site where a dual collection sample was taken from the anterior nares. The specimen was sent to the [facility] lab for COVID-19 testing. Results will be available within 48 hours. Patient received informational instructions on self-care. The specimen was collected by designated RN/LPN/MA per standing order.

## 2019-11-17 NOTE — Telephone Encounter (Signed)
See 11/17/19 TE re: symptoms. Appreciate RN call to [name].    Pending COVID-19 test result 11/17/19.    Pending cardiac catheterization tomorrow 6/9.

## 2019-11-18 ENCOUNTER — Other Ambulatory Visit: Payer: Self-pay

## 2019-11-18 ENCOUNTER — Inpatient Hospital Stay
Admission: RE | Admit: 2019-11-18 | Discharge: 2019-11-18 | Disposition: A | Discharge status: Home / Self Care | Payer: Medicare HMO | Attending: Cardiovascular Disease | Admitting: Cardiovascular Disease

## 2019-11-18 ENCOUNTER — Other Ambulatory Visit (HOSPITAL_BASED_OUTPATIENT_CLINIC_OR_DEPARTMENT_OTHER): Payer: Medicare HMO

## 2019-11-18 ENCOUNTER — Encounter (HOSPITAL_COMMUNITY)
Admission: RE | Disposition: A | Discharge status: Home / Self Care | Payer: Self-pay | Source: Home / Self Care | Attending: Cardiovascular Disease

## 2019-11-18 DIAGNOSIS — R0600 Dyspnea, unspecified: Secondary | ICD-10-CM | POA: Insufficient documentation

## 2019-11-18 DIAGNOSIS — Z01810 Encounter for preprocedural cardiovascular examination: Secondary | ICD-10-CM

## 2019-11-18 DIAGNOSIS — I25710 Atherosclerosis of autologous vein coronary artery bypass graft(s) with unstable angina pectoris: Secondary | ICD-10-CM

## 2019-11-18 DIAGNOSIS — I2582 Chronic total occlusion of coronary artery: Secondary | ICD-10-CM

## 2019-11-18 DIAGNOSIS — R9431 Abnormal electrocardiogram [ECG] [EKG]: Secondary | ICD-10-CM

## 2019-11-18 DIAGNOSIS — R931 Abnormal findings on diagnostic imaging of heart and coronary circulation: Secondary | ICD-10-CM

## 2019-11-18 DIAGNOSIS — I25720 Atherosclerosis of autologous artery coronary artery bypass graft(s) with unstable angina pectoris: Secondary | ICD-10-CM

## 2019-11-18 DIAGNOSIS — I2510 Atherosclerotic heart disease of native coronary artery without angina pectoris: Secondary | ICD-10-CM

## 2019-11-18 DIAGNOSIS — Z951 Presence of aortocoronary bypass graft: Secondary | ICD-10-CM | POA: Insufficient documentation

## 2019-11-18 DIAGNOSIS — R0609 Other forms of dyspnea: Secondary | ICD-10-CM

## 2019-11-18 LAB — EKG 12 LEAD
Atrial Rate: 55 {beats}/min
P Axis: 59 degrees
P-R Interval: 212 ms
Q-T Interval: 418 ms
QRS Duration: 86 ms
QTC Calculation: 399 ms
R Axis: 62 degrees
T Axis: 127 degrees
Ventricular Rate: 55 {beats}/min

## 2019-11-18 LAB — PROTHROMBIN TIME
Prothrombin INR: 1 (ref 0.8–1.3)
Prothrombin Time Patient: 13 s (ref 10.7–15.6)

## 2019-11-18 LAB — CBC (HEMOGRAM)
Hematocrit: 43 % (ref 36–45)
Hemoglobin: 14.6 g/dL (ref 11.5–15.5)
MCH: 30.8 pg (ref 27.3–33.6)
MCHC: 33.6 g/dL (ref 32.2–36.5)
MCV: 92 fL (ref 81–98)
Platelet Count: 217 10*3/uL (ref 150–400)
RBC: 4.74 10*6/uL (ref 3.80–5.00)
RDW-CV: 12.6 % (ref 11.6–14.4)
WBC: 5.59 10*3/uL (ref 4.3–10.0)

## 2019-11-18 LAB — BASIC METABOLIC PANEL
Anion Gap: 7 (ref 4–12)
Calcium: 9.1 mg/dL (ref 8.9–10.2)
Carbon Dioxide, Total: 28 meq/L (ref 22–32)
Chloride: 105 meq/L (ref 98–108)
Creatinine: 0.92 mg/dL (ref 0.38–1.02)
Glucose: 94 mg/dL (ref 62–125)
Potassium: 4 meq/L (ref 3.6–5.2)
Sodium: 140 meq/L (ref 135–145)
Urea Nitrogen: 18 mg/dL (ref 8–21)
eGFR by CKD-EPI: >60 mL/min/{1.73_m2} (ref >59)

## 2019-11-18 SURGERY — CORONARY ANGIOGRAM
Anesthesia: Moderate Sedation | Site: Coronary

## 2019-11-18 MED ORDER — LIDOCAINE HCL (PF) 1 % IJ SOLN
INTRAMUSCULAR | Status: DC | PRN
Start: 2019-11-18 — End: 2019-11-18
  Administered 2019-11-18: 3 mL via SUBCUTANEOUS

## 2019-11-18 MED ORDER — IOHEXOL 350 MG/ML IV SOLN
INTRAVENOUS | Status: DC | PRN
Start: 2019-11-18 — End: 2019-11-18
  Administered 2019-11-18: 85 mL via INTRAVASCULAR

## 2019-11-18 MED ORDER — HEPARIN SODIUM (PORCINE) 1000 UNIT/ML IJ SOLN
INTRAMUSCULAR | Status: AC
Start: 2019-11-18 — End: ?
  Filled 2019-11-18: qty 10

## 2019-11-18 MED ORDER — LORAZEPAM 2 MG/ML IJ SOLN
INTRAMUSCULAR | Status: AC
Start: 2019-11-18 — End: ?
  Filled 2019-11-18: qty 1

## 2019-11-18 MED ORDER — ACETAMINOPHEN 325 MG OR TABS
325.0000 mg | ORAL_TABLET | ORAL | Status: DC | PRN
Start: 2019-11-18 — End: 2019-11-18
  Administered 2019-11-18: 650 mg via ORAL

## 2019-11-18 MED ORDER — NALOXONE HCL 0.4 MG/ML IJ SOLN
0.0400 mg | INTRAMUSCULAR | Status: DC | PRN
Start: 2019-11-18 — End: 2019-11-18

## 2019-11-18 MED ORDER — TICAGRELOR 90 MG OR TABS
ORAL_TABLET | ORAL | Status: AC
Start: 2019-11-18 — End: ?
  Filled 2019-11-18: qty 2

## 2019-11-18 MED ORDER — MIDAZOLAM HCL 2 MG/2ML IJ SOLN
INTRAMUSCULAR | Status: AC
Start: 2019-11-18 — End: ?
  Filled 2019-11-18: qty 2

## 2019-11-18 MED ORDER — ASPIRIN 81 MG OR CHEW
CHEWABLE_TABLET | ORAL | Status: DC | PRN
Start: 2019-11-18 — End: 2019-11-18
  Administered 2019-11-18: 324 mg via ORAL

## 2019-11-18 MED ORDER — SODIUM CHLORIDE 0.9 % IV SOLN
1.5000 mL/kg/h | INTRAVENOUS | Status: DC
Start: 2019-11-18 — End: 2019-11-18
  Administered 2019-11-18: 3 mL/kg/h via INTRAVENOUS

## 2019-11-18 MED ORDER — NITROGLYCERIN 200 MCG/ML 10 ML SYRINGE IN D5W (PROCEDURAL)
INTRAVENOUS | Status: DC | PRN
Start: 2019-11-18 — End: 2019-11-18
  Administered 2019-11-18: 200 ug via INTRACORONARY

## 2019-11-18 MED ORDER — NITROGLYCERIN 200 MCG/ML 10 ML SYRINGE IN D5W (PROCEDURAL)
INTRAVENOUS | Status: AC
Start: 2019-11-18 — End: ?
  Filled 2019-11-18: qty 10

## 2019-11-18 MED ORDER — ACETAMINOPHEN 325 MG OR TABS
ORAL_TABLET | ORAL | Status: AC
Start: 2019-11-18 — End: ?
  Filled 2019-11-18: qty 2

## 2019-11-18 MED ORDER — SODIUM CHLORIDE 0.9% IV BOLUS
250.0000 mL | Freq: Once | INTRAVENOUS | Status: DC | PRN
Start: 2019-11-18 — End: 2019-11-18

## 2019-11-18 MED ORDER — ASPIRIN 325 MG OR TABS
ORAL_TABLET | ORAL | Status: AC
Start: 2019-11-18 — End: ?
  Filled 2019-11-18: qty 1

## 2019-11-18 MED ORDER — NITROGLYCERIN 0.4 MG SL SUBL
0.4000 mg | SUBLINGUAL_TABLET | SUBLINGUAL | Status: DC | PRN
Start: 2019-11-18 — End: 2019-11-18

## 2019-11-18 MED ORDER — HOLD MEDICATION SINGLE DOSE
Freq: Once | Status: DC
Start: 2019-11-18 — End: 2019-11-18
  Filled 2019-11-18: qty 1

## 2019-11-18 MED ORDER — FENTANYL CITRATE (PF) 100 MCG/2ML IJ SOLN
INTRAMUSCULAR | Status: AC
Start: 2019-11-18 — End: ?
  Filled 2019-11-18: qty 2

## 2019-11-18 MED ORDER — IOHEXOL 350 MG/ML IV SOLN
INTRAVENOUS | Status: AC
Start: 2019-11-18 — End: ?
  Filled 2019-11-18: qty 100

## 2019-11-18 MED ORDER — CLOPIDOGREL BISULFATE 75 MG OR TABS
ORAL_TABLET | ORAL | Status: AC
Start: 2019-11-18 — End: ?
  Filled 2019-11-18: qty 4

## 2019-11-18 MED ORDER — MORPHINE SULFATE (PF) 2 MG/ML IV/IJ SOLN WRAPPER
2.0000 mg | Status: DC | PRN
Start: 2019-11-18 — End: 2019-11-18

## 2019-11-18 MED ORDER — CLOPIDOGREL BISULFATE 75 MG OR TABS
300.0000 mg | ORAL_TABLET | Freq: Once | ORAL | Status: AC
Start: 2019-11-18 — End: 2019-11-18
  Administered 2019-11-18: 300 mg via ORAL

## 2019-11-18 MED ORDER — OXYCODONE HCL 5 MG OR TABS
5.0000 mg | ORAL_TABLET | ORAL | Status: DC | PRN
Start: 2019-11-18 — End: 2019-11-18

## 2019-11-18 MED ORDER — CLOPIDOGREL BISULFATE 75 MG OR TABS
75.0000 mg | ORAL_TABLET | Freq: Every day | ORAL | 11 refills | Status: DC
Start: 2019-11-18 — End: 2020-03-17

## 2019-11-18 MED ORDER — ONDANSETRON HCL 4 MG OR TABS
4.0000 mg | ORAL_TABLET | Freq: Three times a day (TID) | ORAL | Status: DC | PRN
Start: 2019-11-18 — End: 2019-11-18

## 2019-11-18 MED ORDER — LIDOCAINE HCL (PF) 1 % IJ SOLN
INTRAMUSCULAR | Status: AC
Start: 2019-11-18 — End: ?
  Filled 2019-11-18: qty 30

## 2019-11-18 MED ORDER — ONDANSETRON HCL 4 MG/2ML IJ SOLN
4.0000 mg | Freq: Three times a day (TID) | INTRAMUSCULAR | Status: DC | PRN
Start: 2019-11-18 — End: 2019-11-18

## 2019-11-18 MED ORDER — FENTANYL CITRATE (PF) 100 MCG/2ML IJ SOLN
25.0000 ug | INTRAMUSCULAR | Status: DC | PRN
Start: 2019-11-18 — End: 2019-11-18
  Administered 2019-11-18 (×2): 50 ug via INTRAVENOUS

## 2019-11-18 MED ORDER — FLUMAZENIL 0.5 MG/5ML IV SOLN
0.2000 mg | INTRAVENOUS | Status: DC | PRN
Start: 2019-11-18 — End: 2019-11-18

## 2019-11-18 MED ORDER — LORAZEPAM 2 MG/ML IJ SOLN
0.5000 mg | Freq: Once | INTRAMUSCULAR | Status: AC
Start: 2019-11-18 — End: 2019-11-18
  Administered 2019-11-18: 0.5 mg via INTRAVENOUS

## 2019-11-18 MED ORDER — ATROPINE SULFATE 1 MG/10ML IJ SOSY
0.5000 mg | PREFILLED_SYRINGE | INTRAMUSCULAR | Status: DC | PRN
Start: 2019-11-18 — End: 2019-11-18

## 2019-11-18 MED ORDER — SODIUM CHLORIDE 0.9 % IV SOLN
INTRAVENOUS | Status: AC
Start: 2019-11-18 — End: ?
  Filled 2019-11-18: qty 1000

## 2019-11-18 MED ORDER — TICAGRELOR 90 MG OR TABS
ORAL_TABLET | ORAL | Status: DC | PRN
Start: 2019-11-18 — End: 2019-11-18
  Administered 2019-11-18: 180 mg via ORAL

## 2019-11-18 MED ORDER — IOHEXOL 300 MG/ML IJ SOLN
INTRAMUSCULAR | Status: AC
Start: 2019-11-18 — End: ?
  Filled 2019-11-18: qty 50

## 2019-11-18 MED ORDER — MIDAZOLAM HCL 2 MG/2ML IJ SOLN
0.5000 mg | INTRAMUSCULAR | Status: DC | PRN
Start: 2019-11-18 — End: 2019-11-18
  Administered 2019-11-18 (×2): 1 mg via INTRAVENOUS

## 2019-11-18 SURGICAL SUPPLY — 18 items
BALLOON NC QUANTUM APEX 3MM 15MM 143CM (Balloon) ×5
BALLOON NC QUANTUM APEX 3MM 15MM 143CM MFR DISCONTINUED (Balloon) ×4 IMPLANT
CATHETER EXPO 6FR 100CM FL4 (Catheter) ×5
CATHETER EXPO 6FR 100CM FL4 MFR DISCONTINUED (Catheter) ×4 IMPLANT
CATHETER EXPO 6FR 100CM FR4 (Catheter) ×5
CATHETER EXPO 6FR 100CM FR4 MFR DISCONTINUED (Catheter) ×4 IMPLANT
CATHETER ULTRASOUND EAGLE EYE PLATINUM 3.3-2.9FR (Catheter) ×10 IMPLANT
DEVICE VASCULAR CLOSURE PROGLIDE  6F (Closure Device) ×5 IMPLANT
EMBOLIC PROTECTION SPIDERFX 4MM SVG/CARTOID FLTR 320CM .014IN (Thrombectomy Device) ×5 IMPLANT
GUIDE CATHETER LAUNCHER 6FR 100CM AL1 (Cardiac Catheter / Guide) ×5 IMPLANT
GUIDE CATHETER LAUNCHER 6FR 100CM JR4 (Cardiac Catheter / Guide) ×5 IMPLANT
GUIDE CATHETER LAUNCHER 6FR 100CM LCB (Cardiac Catheter / Guide) ×10 IMPLANT
GUIDEWIRE RUNTHROUGH NS .014 X 180CM (Interventional Guidewire) ×10 IMPLANT
KIT GUIDE WIRE ACCES W/COPILOT (Other) ×10 IMPLANT
KIT MICRO INTRODUCER 5FR 10CM X 21G NEEDLE (Sheath) ×5 IMPLANT
PACK CUST LEFT HEART ANGIO (Pack) ×5 IMPLANT
SHEATH INTRODUCER PINNACLE  6F X 10CM .038 W/GUIDEWIRE (Sheath) ×5 IMPLANT
STENT CORONARY RESOLUTE ONYX 3MM 30MM BIOLINX RXANGE (Drug Eluting Stent) ×5 IMPLANT

## 2019-11-18 NOTE — Telephone Encounter (Signed)
11/17/19 COVID-19 test negative.

## 2019-11-18 NOTE — Brief Op Note (Signed)
Immediate Brief Operative Note   Wendy [name] - DOB: 13-Feb-1946 (74 year old female) MRN: [id number]  Procedure Date: 11/18/2019      [facility]       PROCEDURE DETAILS    Procedure Team:  Primary: Serena Colonel, MD  Fellow: Diamantina Monks, MD     Procedure(s):   Coronary angiogram - Bilateral  Left heart catheterization (LVEDP only) - Left  PCI (Percutaneous Coronary Intervention) - Bilateral   Pre Procedure Diagnosis:  Dyspnea on exertion [R06.00]  Hx of CABG [Z95.1]     Post Procedure Diagnosis:        * Dyspnea on exertion [R06.00]     * Hx of CABG [Z95.1]       PROCEDURE SUMMARY   Anesthesia: Anesthesia type not filed in the log.    ASA: ASA status not filed in the log.  Estimated Blood Loss: * No values recorded between 11/18/2019 11:31 AM and 11/18/2019  1:39 PM *    Total IV Fluids: 0 mL    SPECIMENS:   No specimens were documented in this log.    FINDINGS    Successful PCI of SVG-diagonal using 3.0(30) mm Synergy DES    Diagnostic angiography demonstrating severe disease of SVG-rPDA graft, severe disease of SVG-diagonal and occluded SVG-RI-diagonal. LIMA-LAD patent.   31F Right femoral arterial access    POST OP PLAN    Reloaded with plavix 300 mg PO x 1 this evening, continue plavix 75 mg daily for at least 1 year, longer if tolerated   Continue ASA 81 mg daily   F/u visit in 2 weeks with Dr. Bridgett Larsson and Dr. [name] to discussed staged intervention (CTO PCI of SVG-RI-diag given predominately lateral wall ischemia, followed by consideration of RCA CTO PCI given inferior STE on DSE as well as RWMA at stress, pending patient's clinical course)

## 2019-11-18 NOTE — Discharge Instructions (Signed)
After Your Left Heart Catheterization Femoral (Groin) Approach  Self-care after your procedure    This handout gives self-care instructions for you to follow after your left heart catheterization procedure.  Activity  For the rest of the day after your procedure:  Rest quietly at home  Do not drive for 24 hours  If you had any sedation, see page 2  Starting 24 hours after you are discharged from your procedure, you may:  Shower  Return to light activity  Drive  For 48 hours after your procedure:  Do not do anything that puts stress on your puncture site. This includes housework, gardening, and many self-care tasks. Ask for help with any tasks that need to be done during this time.  You may go up and down stairs, but limit how much you do this.  For 7 days after your procedure:  Do NOT lift more than 10 pounds (4.54 kilograms). A gallon of milk weighs almost 9 pounds. This includes pets, groceries, children, trash, and laundry.  Do NOT hold your breath, bear down, or strain when having a bowel movement.  Do NOT allow the puncture site to be covered by water. This means do not take a bath, sit in a hot tub, or go swimming.     If You Had ANY Sedation  Sedation can make you sleepy, and make it hard for you to think clearly. Because of this:  A responsible adult must take you home after your procedure. You may not take a bus, shuttle, taxi, or any other transportation by yourself.   For 24 hours after your procedure:  Do NOT drive.   Make sure you have a responsible adult who can help you as needed during this time.  Do NOT be responsible for the care of anyone else, such as children, pets, or an adult who needs care.   Do NOT drink alcohol or take drugs other than the ones your doctors prescribed or suggested.   Do NOT make important decisions or sign legal papers.  Pain Control  You will most likely be sore for 1 to 2 days at the puncture site where the catheter was inserted.   You may take acetaminophen (Tylenol) to  relieve pain. Follow the dose instructions on the package.   For 5 days after your procedure, do not take anti-inflammatories such as ibuprofen (Advil, Motrin) or naproxen (Aleve, Naprosyn). They may cause bleeding.   If your doctor prescribed aspirin for your heart, you may take it as usual. But do not take extra aspirin for pain control.   If pain at your puncture site is not eased by acetaminophen, call [phone] and ask to page the Cardiology I Fellow on call.  Site Care  Keep the puncture site clean and dry.   You may remove the dressing or bandage 24 hours after your procedure.   After you remove the dressing, gently clean the site with mild soap and water. Do not scrub or rub the area. Pat dry with a clean towel.  For the next 3 days, watch for signs of infection. Call the cardiologist who did your procedure if you see:   Redness around the site  Fever higher than 101.5F (38.6C)  Drainage at the site  You may have a bruise where the catheter was inserted. It may spread down your leg. It may take 2 to 3 weeks for the bruise to go away.  When to Call for Help  If you have sudden, heavy   bleeding or a lot of swelling that you cannot control, apply firm pressure to the site and call 911.  Call [phone] and ask to page the Cardiology I Fellow on call if you have:  Drainage from the site   A lot of redness around the site   Bleeding  If you have light or moderate bleeding or swelling at the site:  Use clean fingers to apply pressure on it for 10 minutes.  If bleeding does not stop or swelling does not go down in 10 minutes, call 911 right away. Keep applying pressure until help arrives.  Other Concerns  Call the cardiologist who did your procedure if you have:  Any of these signs of infection:  Redness  Fever higher than 101.5F (38.6C)  Drainage  Change in the bruise or lump   Severe pain that is not relieved by acetaminophen (Tylenol)  Medicines After Your Procedure  If you had a stent placed, you will  take:  Aspirin to prevent blood clots in the artery where the stent was placed.   A blood-thinning medicine similar to aspirin that will help prevent blood clots. One of these is called clopidogrel (Plavix), but your cardiologist may prescribe a similar medicine with a different name.  Resume all heart medicines you were taking before your procedure. Your primary cardiologist will review your medicines at your follow-up visit within 2 to 4 weeks after your procedure.   For minor pain, you may take acetaminophen (Tylenol), either regular (325 mg) or extra strength (500 mg). Do not take more than 4 gm (4,000 mg) in a 24-hour period.   Keep taking your other prescribed medicines unless your doctor tells you otherwise.  Follow-up Care  Schedule a follow-up visit with your heart doctor (cardiologist) or primary care provider (PCP). Be sure to keep this appointment. Follow-up visits are usually 2 to 4 weeks after you leave the hospital.  If you had a stent placed, the artery in your heart can become blocked again after the procedure. Watch for the same symptoms that you had before the procedure. Call your doctor right away if your symptoms return.  Questions?  Your questions are important. Call your doctor or healthcare provider if you have questions or concerns.   For general questions: Weekdays from 8 a.m. to 5 p.m., call the [facility] at [phone].  For questions related to your procedure: Weekdays from 6:30 a.m. to 8 p.m., call Cardiac Procedures at [phone]. Ask to talk with a nurse.  For urgent concerns related to your procedure, or if it is after hours or on a weekend or holiday: Call [phone] and ask to page the Cardiology I fellow on call.           Reviewed 03/2017

## 2019-11-18 NOTE — Procedure Nursing Note (Signed)
I[facility] RN Post Procedure Summary Note    Procedure Performed:  PCI    Ride Home with/Report Given to:  82, daughter    Discharge Criteria:  x  VSS/within 20% of baseline  x  Pt meets goal for pain relief  x  Pt meets goal for nausea relief  x  Return to baseline respiratory status/minimal FiO2 requirements if transferred  x  Return to baseline neuro status  x  Tolerated Liquids  x  Tolerated Solids  x  Voided Post Procedure  x  PIV d/c'd  x  Ambulated in halls without hematoma, bleeding, or other symptoms  x  If discharging post PCI:    x  Pt already on, or prescribed, DAP medication therapy.  -  Cardiac Rehab referral prescribed (Patient will return at later time for further coronary interventions)    Evaluation of Procedural Site: R femoral site CDI without hematoma upon d/c    Discharge Education:    x  D/C education provided per appropriate [facility] authored Health Online document  x  D/C medication prescription(s) reviewed with patient and/or escort  x  Follow-up appointments reviewed with patient and/or escort  x  Patient and/or escort verbalized understanding of teaching    Nursing Narrative: Patient anxious post procedure, feeling like she can't find the right words to describe how she feels, no neuro changes, assessed by NP, 0.5 mg ativan given for anxiety with good effect. Feeling weak after ambulating but VSS. Loading dose of plavix given prior to d/c, patient and daughter knows to pick up prescription tomorrow.

## 2019-11-18 NOTE — Procedure Nursing Note (Signed)
ICRU RN Pre Procedure Summary Note    Planned Procedure:  CAD*    Venipuncture performed in ICRU:    -  PIV placed  x  PIV x 2 placed  -  Pre-existing central line accessed    Planned disposition:    -  Home  -  Admit overnight  x  TBD    Pt prepped per orders and standards of care in ICRU.  See pre procedure documentation for details of pt's prep.    Nursing Narrative:

## 2019-11-19 ENCOUNTER — Telehealth (HOSPITAL_BASED_OUTPATIENT_CLINIC_OR_DEPARTMENT_OTHER): Payer: Self-pay | Admitting: Cardiovascular Disease

## 2019-11-19 LAB — EKG 12 LEAD
Atrial Rate: 56 {beats}/min
P Axis: 64 degrees
P-R Interval: 192 ms
Q-T Interval: 440 ms
QRS Duration: 86 ms
QTC Calculation: 424 ms
R Axis: 76 degrees
T Axis: 113 degrees
Ventricular Rate: 56 {beats}/min

## 2019-11-19 LAB — ACT LOW RANGE (POC), ~~LOC~~: ACT Low Range (POC): 305 s

## 2019-11-19 NOTE — Telephone Encounter (Addendum)
No availability within requested time frame. Called Cards who will check and call back.    Update: Pt to see RN in Cards on 12/10/19 at 11am, after appt w/ PCP.    Tried to call pt but reached VM. Left msg informing her of appt. Direct CB# provided in case pt has any questions.

## 2019-11-19 NOTE — Telephone Encounter (Signed)
RETURN CALL: Voicemail - Detailed Message      SUBJECT:  Appointment Request     REASON FOR VISIT: follow up cardiac catheter and stent placement//pre-op  PREFERRED DATE/TIME: within 2 weeks with Dr Bridgett Larsson  ADDITIONAL INFORMATION:

## 2019-11-19 NOTE — Procedure Nursing Note (Signed)
Patient c/o soreness and feels like access site is swollen, R groin.  Patient still has the bandage on.  Advised patient is pain and swelling at site increase to present to the nearest ED.  Patient will try ice first and continue to monitor symptoms.  Patient given instructions of when to present to the ED.  Patient agreed with plan.  Cyndra Numbers, RN, 11/19/2019 10:16 AM

## 2019-11-20 NOTE — Telephone Encounter (Addendum)
#  [id number] has been approved per recording from [place].  Will call the patient to let them know that it did go through.     Left message for both Serene and Jocilyn to let them know that the procedure was authorized by the plan

## 2019-11-30 NOTE — Telephone Encounter (Signed)
Scheduled to follow up with me 12/10/19.

## 2019-12-02 ENCOUNTER — Encounter (HOSPITAL_BASED_OUTPATIENT_CLINIC_OR_DEPARTMENT_OTHER): Payer: Medicare HMO | Admitting: Adult Health

## 2019-12-04 ENCOUNTER — Ambulatory Visit: Payer: Medicare HMO | Attending: Cardiovascular Disease | Admitting: Cardiovascular Disease

## 2019-12-04 ENCOUNTER — Other Ambulatory Visit: Payer: Self-pay

## 2019-12-04 VITALS — BP 145/65 | HR 62

## 2019-12-04 DIAGNOSIS — Z951 Presence of aortocoronary bypass graft: Secondary | ICD-10-CM

## 2019-12-04 DIAGNOSIS — I25708 Atherosclerosis of coronary artery bypass graft(s), unspecified, with other forms of angina pectoris: Secondary | ICD-10-CM | POA: Insufficient documentation

## 2019-12-04 NOTE — Progress Notes (Signed)
[facility]    Clinic follow up note    Wendy, [name]  [id number]  05/03/1946    Addendum:   74yo woman with CAD s/p CABG with LIMA-LAD, SVG-dRCA with endarterectomy of RPDA and PL, SVG-RI-D1 reported and graft to D3 who presented with escalating angina and stress test showing ischemia in the anterolateral and inferior walls on stress test and inferior ST changes with stress.   Angiogram showed occluded graft to the ramus-diag and severe disease in the patent SVG-diagonal s/p successful PCI with a 3.0x52mm stent, and bifurcation disease at the anastamosis of her graft to the RCA.   Wendy [name] presents to clinic today to review options for stable ischemic heart disease. Fortunately Wendy [name] is feeling improved and finds her energy and SOB is improved. Wendy [name] still has some dyspnea one exertion that may be limiting, but Wendy [name] and her son note Wendy [name] was not very active before, and typically avoided strenuous exercise because of her heart disease, and feels deconditioning is also a component. Wendy [name] is concerned about requiring another procedure and has several questions about the same.     On exam Wendy [name] is a pleasant woman in NAD, pulses 2+  No LE edema  Neuro: clear speech, MAE    Assessment: we reviewed the indications for revascularization in the setting of stable ischemic heart disease, risk of recurrent disease following vein graft intervention, and options for revascularization of her native coronaries balanced by procedural risks. As Wendy [name] is improving clinically I recommended we watch her on medical therapy for now, as Wendy [name] may have some room to work with there as well given her overall gains. The main target for revascularization would be her RCA bifurcation--as this has had an endarterectomy these can be quite fibrotic, and may consider POBA via the graft vs native RCA CTO if required, but should avoid stenting across the anastamosis if at all possible.     Referral to cardiac rehab  placed  Consider increase metoprolol to 75mg  daily; Wendy [name] will monitor sx  F/u with Dr. Ronalee Red and Dr. Bridgett Larsson scheduled, we are happy to see her back prn      Primary Cardiologist: Bryson Corona, MD  Primary Care Physician: Debroah Loop, MD  [facility] IC:  Marisa Sprinkles, MD     Reason for follow up:   Coronary artery disease  Possible staged procedure      HPI:  Wendy [name] is a 74 year old female with a PMH significant for CAD, s/p CABG (2006.  LIMA-LAD, SVG-dRCA (with endarterectomy of RPDA and PL), SeqSVG-RI-D1, SVG-D3), AR, essential HTN, mild AS, HLD, GERD, thyroid disease, hyperparathyroidism, asthma, non-functioning right kidney, and bipolar disorder.  Wendy [name] underwent successful PCI with Dr. [name] on 11/15/2019 and returns today to discuss further interventions.    Today, patient says that Wendy [name] is feeling better, but continues to get SOB especially with activity.  Wendy [name] states her fatigue is improved and Wendy [name] has more energy.  States Wendy [name] is able to "push through" the SOB.  Denies any weight gain, orthopnea, PND, CP, HA, syncope, palpitations, dizziness, LE swelling. Denies nose bleeds, blood in stool.      NYHA Class I-II  CCS Angina Class I    Past Medical History:  Patient Active Problem List    Diagnosis Date Noted    Abnormal dobutamine stress echocardiogram [R94.39] 11/03/2019    Dyspnea on exertion [R06.00] 11/12/2019     Added automatically from request for surgery [phone]  Esophageal dysphagia [R13.10] 08/24/2019    Gastroesophageal reflux disease without esophagitis [K21.9] 08/24/2019    Body mass index 40.0-44.9, adult ([place]) [Z68.41] 07/16/2019    Primary osteoarthritis of right knee [M17.11] 07/16/2019    Hx of CABG [Z95.1]     Essential hypertension [I10] 06/08/2019    Asthma [J45.909] 06/08/2019    Bipolar disorder without psychotic features ([place]) [F31.9] 06/08/2019    Atherosclerosis of native coronary artery of native heart [I25.10] 06/08/2019     Multiple vessel 2006  Abnormal DSE  10/2019  Cath 11/18/19 PCI SVG-diagonal      Colon polyps [K63.5] 06/08/2019    Depression [F32.9] 06/08/2019    Generalized anxiety disorder [F41.1] 06/08/2019    Migraine [G43.909] 06/08/2019    Thyroid disease [E07.9] 06/08/2019    Vitamin D deficiency [E55.9] 06/08/2019    Mixed hyperlipidemia [E78.2]      Cannot tolerate statins      Hyperparathyroidism ([place]) [E21.3]     Osteoporosis without current pathological fracture [M81.0]     Nephrolithiasis [N20.0] 04/27/2019    Non-functioning R kidney [N28.9] 01/22/2019     Due to ureteral stone      Pulmonary nodule less than 6 cm determined by computed tomography of lung [R91.1] 01/09/2019     R lung 5 mm nodule incidental finding [facility] [place])      Abnormal computed tomography of pelvis [R93.5] 01/09/2019     Probable fibroid      History of R parathyroidectomy (2017) [Z90.09] 02/07/2016    Aortic stenosis, mild [I35.0] 01/08/2016     02/25/18 TTE Mild aortic stenosis with mild-moderate aorticregurgitation      Aortic regurgitation [I35.1] 01/08/2016     02/25/18 TTE mild-moderate      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 For allergies, Disp: 30 tablet, Rfl: 2    metoprolol succinate ER 50 MG 24 hr tablet, Take 1 tablet (50 mg) by mouth daily., Disp: 90 tablet, Rfl: 2    nitroGLYCERIN 0.4 MG SL tablet, Place 1 tablet (0.4 mg) under the tongue every 5 minutes as needed for chest pain for up to 3 doses. If no relief, call 911., Disp: 25 tablet, Rfl: 6     spironolactone 25 MG tablet, Take 1 tablet (25 mg) by mouth daily., Disp: 90 tablet, Rfl: 3      Review of systems:  Review of Systems  Except for those mentioned in the HPI, complete review of systems is negative      Physical Exam:  There is no height or weight on file to calculate BMI.  BP (!) 145/65    Pulse 62    SpO2 95%   Physical Exam   Constitutional: No distress.   Cardiovascular: Normal rate, regular rhythm, S1 normal, S2 normal and intact distal pulses.   Pulmonary/Chest: Effort normal and breath sounds  normal.   Musculoskeletal:         General: No edema.   Neurological: Wendy [name] is alert and oriented to person, place, and time.   Skin: Skin is dry.         Labs:    [place] WBC (10*3/uL)   Date Value   11/18/2019 5.59   04/18/2019 6.53    Sodium No results found for: NA   Hb No results found for: HGB Potassium No results found for: K   Platelet No results found for: PLT CO2 Carbon Dioxide, Total (meq/L)   Date Value   11/18/2019 28   08/24/2019 31   07/16/2019 31   04/18/2019 29      INR Prothrombin INR (no units)   Date Value   11/18/2019 1.0   04/18/2019 1.0    Chloride Chloride (meq/L)   Date Value   11/18/2019 105   08/24/2019 101   07/16/2019 104   04/18/2019 103      TSH TSH with Reflexive Free T4 (u[IU]/mL)   Date Value   06/23/2019 2.967    BUN Urea Nitrogen (mg/dL)   Date Value   11/18/2019 18   08/24/2019 22 (H)   07/16/2019 12   04/18/2019 15      LDL Cholesterol (LDL) (mg/dL)   Date Value   07/16/2019 186 (H)    Creatinine Creatinine (mg/dL)   Date Value   11/18/2019 0.92   08/24/2019 0.99   07/16/2019 0.93   04/18/2019 0.91      Glucose No results found for: GLU Calcium No components found for: CALCIUM   HbA1C No components found for: HBA1C Magnesium No results found for: MG   Troponin No components found for: TROPONINI         Diagnostics:    Coronary angiogram  11/18/2019   Successful PCI of SVG-diagonal using 3.0(30) mm Synergy DES    Diagnostic angiography demonstrating severe disease of  SVG-rPDA graft, severe disease of SVG-diagonal and occluded SVG-RI-diagonal. LIMA-LAD patent.   80F Right femoral arterial access      Assessment and Plan:    In summary, patient is a 74 year old female with a PMH significant for CAD, s/p CABG (2006.  LIMA-LAD, SVG-dRCA (with endarterectomy of RPDA and PL), SeqSVG-RI-D1, SVG-D3), AR, essential HTN, mild AS, HLD, GERD, thyroid disease, hyperparathyroidism, asthma, non-functioning right kidney, and bipolar disorder.  Wendy [name] underwent successful PCI with Dr. [name] on 11/15/2019 and returns today to discuss further interventions.    #Coronary artery disease    Please refer to attending provider documentation for summary of conversation, patient report or current condition and subsequent assessment and plan.    This is a shared visit with Dr. Marisue Ivan Isley Weisheit, [place]  12/04/2019      I spent a total time of 20 minutes face-to-face with the patient, of which more than 50% was spent counseling as outlined in this note. I spent 20 minutes in chart review and documentation preparation/completion of this visit.

## 2019-12-04 NOTE — Patient Instructions (Signed)
We discussed possible procedures but no need to pursue that right now.    We will increase your metoprolol slightly to 1 and a half tablets, 75mg  daily (you take it at night right now.)

## 2019-12-09 NOTE — Progress Notes (Signed)
[facility] SUBSEQUENT CARE VISIT NOTE     Chief Complaint   Patient presents with   . Follow-Up        ID/CC:  Wendy [name] is a 74 year old community-dwelling female being seen for follow up of coronary artery disease, hypertension, allergies. Last visit with me 10/15/19.     HPI:  Baseline: lives alone at [facility] (senior housing)  Accompanied today by none  Interpreter or not: NO    Interim history:  10/26/19 urology follow up, note reviewed  11/03/19 dobutamine stress echo abnormal  11/12/19 cardiology Dr Bridgett Larsson  11/18/19 cardiac catheterization PCI SVG --> diagonal  12/04/19 cardiology (interventional) follow up, note reviewed     Concerns today: Reports more energy since cardiac catheterization 6/9. Still notices some shortness of breath with walking. Happy that she may not need additional intervention(s). Plans to start cardiac rehab at [facility]. Hasn't called back yet to schedule appointment. Wants to be able to walk more. Did okay at her daughter's wedding earlier this month.    Kidney stones: hasn't started K citrate Litholyte recommended by urology yet. Not sure if mail was lost.    Allergies: better but still noticing symptoms. Taking medication.    Requests check ears.     Patient Active Problem List   Diagnosis   . Nephrolithiasis   . Hyperparathyroidism ([place])   . Osteoporosis without current pathological fracture   . Non-functioning R kidney   . Essential hypertension   . Aortic stenosis, mild   . Asthma   . Bipolar disorder without psychotic features ([place])   . Atherosclerosis of native coronary artery of native heart   . Colon polyps   . Depression   . Generalized anxiety disorder   . Mixed hyperlipidemia   . Migraine   . History of R parathyroid adenoma   . History of R parathyroidectomy (2017)   . Thyroid disease   . Vitamin D deficiency   . Hx of CABG   . Aortic regurgitation   . Pulmonary nodule less than 6 cm determined by computed tomography of lung   .  Abnormal computed tomography of pelvis   . Body mass index 40.0-44.9, adult ([place])   . History of R metatarsal fractures   . Primary osteoarthritis of right knee   . Esophageal dysphagia   . Gastroesophageal reflux disease without esophagitis   . Abnormal dobutamine stress echocardiogram   . Dyspnea on exertion   . Diastolic dysfunction         Current Outpatient Medications   Medication Sig Dispense Refill   . amLODIPine 10 MG tablet Take 1 tablet (10 mg) by mouth daily. 90 tablet 2   . aspirin 81 MG EC tablet Take 81 mg by mouth daily.     . clopidogrel 75 MG tablet Take 1 tablet (75 mg) by mouth daily. 30 tablet 11   . fexofenadine 180 MG tablet Take 1 tablet (180 mg) by mouth daily. For allergies 30 tablet 2   . metoprolol succinate ER 50 MG 24 hr tablet Take 1 tablet (50 mg) by mouth daily. 90 tablet 2   . nitroGLYCERIN 0.4 MG SL tablet Place 1 tablet (0.4 mg) under the tongue every 5 minutes as needed for chest pain for up to 3 doses. If no relief, call 911. 25 tablet 6   . spironolactone 25 MG tablet Take 1 tablet (25 mg) by mouth daily. 90 tablet 3  No current facility-administered medications for this visit.      Review of patient's allergies indicates:  Allergies   Allergen Reactions   . Hydrochlorothiazide Skin: Itching and Throat Swelling   . Lidocaine Other     Hallucinations   . Penicillins Skin: Hives     Reaction in [place] (??)  Has patient had a PCN reaction causing immediate rash, facial/tongue/throat swelling, SOB or lightheadedness with hypotension: Yes  Has patient had a PCN reaction causing severe rash involving mucus membranes or skin necrosis: No  Has patient had a PCN reaction that required hospitalization: No  Has patient had a PCN reaction occurring within the last 10 years: No  If all of the above answers are "NO", then may proceed with Cephalosporin use.     . Coconut Fatty Acids      Other reaction(s): Other (See Comments)  Reaction??   . Latex Skin: Itching   . Losartan  Headache and Other     Other reaction(s): Other (See Comments), Unknown  Pt doesn't remember.  High blood pressure   . Statins Headache and Other     Other reaction(s): Other (See Comments), Other (See Comments) high blood pressure     Geriatric ROS: per ARNP [place]'s 06/08/2019 note  "ADLs:independent  IADLS:independent and can be better  Cognition:no concern  Mood:anxiety(anxious about moving to a big city,  Mobility:limitations due to ankle and knee stiffness  Vision:wearing glasses  Hearing:wax build up  Continence:none"  CFS-9: 3    Social History:  DPOA/Legal Next of Kin/main contact person/main contact #s: daughter Serene    Immunization History   Administered Date(s) Administered   . COVID-19 Moderna mRNA LNP-S PF 07/03/2019, 08/11/2019   . Influenza quadrivalent MDCK PF (Flucelvax) 04/18/2019   . Influenza quadrivalent PF 07/11/2017   . Influenza trivalent high-dose 03/04/2018   . Pneumococcal conjugate PCV13 (Prevnar 13) 05/21/2014   . Pneumococcal polysaccharide PPSV23 (Pneumovax 23) 11/14/2012   . Tdap 11/14/2012       PHYSICAL EXAMINATION:  BP (!) 160/64   Pulse 74   Temp 35.2 C (Temporal)   Wt 97.1 kg (214 lb)   SpO2 96%   BMI 40.43 kg/m    Wt Readings from Last 5 Encounters:   12/10/19 97.1 kg (214 lb)   11/18/19 96.2 kg (212 lb)   11/12/19 96.9 kg (213 lb 9.6 oz)   11/03/19 96.4 kg (212 lb 8.4 oz)   10/15/19 96.4 kg (212 lb 9.6 oz)       GENERAL: pleasant older female BMI 40. Appearance: well-groomed, stated age, sitting comfortably in chair, prefers not to wear face mask during visit.  HEENT: EARS: normal auditory canals bilaterally with slight cerumen, normal tympanic membranes bilaterally.  CV: regular rate and rhythm, normal S1, S2, harsh 4/6 systolic murmur, no OZDG/[id number]  LUNGS: unlabored breathing, clear to auscultation bilaterally  Gait: no assistive device     Labs/Other Results:  Admission on 11/18/2019, Discharged on 11/18/2019   Component Date Value   . Sodium  11/18/2019 140    . Potassium 11/18/2019 4.0    . Chloride 11/18/2019 105    . Carbon Dioxide, Total 11/18/2019 28    . Anion Gap 11/18/2019 7    . Glucose 11/18/2019 94    . Urea Nitrogen 11/18/2019 18    . Creatinine 11/18/2019 0.92    . Calcium 11/18/2019 9.1    . eGFR, Calculated 11/18/2019 >60    . GFR, Information 11/18/2019 Calculated GFR by  CKD-EPI equation. Inaccurate with changing renal function. See http://depts.[email or web address].html.    . WBC 11/18/2019 5.59    . RBC 11/18/2019 4.74    . Hemoglobin 11/18/2019 14.6    . Hematocrit 11/18/2019 43    . MCV 11/18/2019 92    . [place] 11/18/2019 30.8    . MCHC 11/18/2019 33.6    . Platelet Count 11/18/2019 217    . RDW-CV 11/18/2019 12.6    . Ventricular Rate 11/18/2019 55    . Atrial Rate 11/18/2019 55    . P-R Interval 11/18/2019 212    . QRS Duration 11/18/2019 86    . Q-T Interval 11/18/2019 418    . QTC Calculation 11/18/2019 399    . P Axis 11/18/2019 59    . R Axis 11/18/2019 62    . T Axis 11/18/2019 127    . Diagnosis 11/18/2019                      Value:SINUS BRADYCARDIA WITH 1ST DEGREE A-V BLOCK  ST & T WAVE ABNORMALITY, CONSIDER LATERAL ISCHEMIA  ABNORMAL ECG  NO PREVIOUS ECGS AVAILABLE  Confirmed by FISHBEIN MD, DANIEL ([id number]) on 11/18/2019 4:54:56 PM     . Prothrombin Time Patient 11/18/2019 13.0    . Prothrombin INR 11/18/2019 1.0    . ACT LOW RANGE (PCR) 11/18/2019 305    . ACT COMMENTS (POC) 11/18/2019 None    . ACT REF RANGE (POC) 11/18/2019 Reference Range:    . Performing Lab ACT (POC) 11/18/2019 Test performed by: [facility], [place], [place] [id number]      ASSESSMENT/PLAN:  1. Dyspnea on exertion: recent PCI SVG-diagonal reports more energy  2. Atherosclerosis of native coronary artery of native heart without angina pectoris  - Call to schedule cardiac rehabilitation [place] aspirin + clopidogrel + metoprolol  - Cardiology Dr Bridgett Larsson follow up next  month    3. Essential hypertension: BP decreased on repeat per MA  - Continue amlodipine + spironolactone + metoprolol  - Nursing communication  - 11/18/19 BMP within normal limits    4. Nephrolithiasis: not currently taking K citrate urology recommended Litholyte (OTC)  - Will check with urology if okay to prescribe Urocit instead (dose is slightly different than Litholyte)    5. History of colon polyps: surveillance due this year  6. Screening for malignant neoplasm of colon  - Referral to Gastroenterology- Procedure Colonoscopy; Screening (follow up colon polyps); Future -- okay for later in 2021    7. Encounter for screening mammogram for malignant neoplasm of breast  - Mammography Screening w/[facility] Bilateral; Future -- [facility] mobile mammography van when @ [place] Maintenance:  - Labs: normal glucose 11/18/19; lipids 07/16/19; TSH normal 06/23/19; 25-OH vitamin D normal 07/16/19; vitamin B-12 normal 03/04/18 ([facility] see Care Everywhere)  - Supplements: none  - Immunizations: recombinant zoster vaccine discuss at future appointment  - Osteoporosis screening (DEXA): indicated N/A known osteoporosis. Last DEXA 07/16/19 next due 07/2021.  - Abdominal aortic aneurysm screening: indicated NO (USPSTF guideline recommends against screening females who never smoked)  - Colon cancer screening: indicated YES ordered surveillance colon polyps due this year  - Mammogram/breast cancer screening: indicated YES ordered    Advance care planning/POLST: LNOK daughter Hulan Saas    Return in about 3 months (around 03/11/2020) for routine follow-up, sooner if needed for other questions or concerns.  01/21/2020 2:30 PM (Arrive by 2:15 PM) Martyn Ehrich, MD [facility]    03/17/2020 10:30 AM (Arrive by 10:15 AM) Debroah Loop, MD [facility]

## 2019-12-10 ENCOUNTER — Encounter (HOSPITAL_BASED_OUTPATIENT_CLINIC_OR_DEPARTMENT_OTHER): Payer: Self-pay | Admitting: Internal Medicine

## 2019-12-10 ENCOUNTER — Ambulatory Visit (HOSPITAL_BASED_OUTPATIENT_CLINIC_OR_DEPARTMENT_OTHER): Payer: Medicare HMO

## 2019-12-10 ENCOUNTER — Ambulatory Visit: Payer: Medicare HMO | Attending: Internal Medicine | Admitting: Internal Medicine

## 2019-12-10 VITALS — BP 160/64 | HR 74 | Temp 95.4°F | Wt 214.0 lb

## 2019-12-10 DIAGNOSIS — N200 Calculus of kidney: Secondary | ICD-10-CM | POA: Insufficient documentation

## 2019-12-10 DIAGNOSIS — R0600 Dyspnea, unspecified: Secondary | ICD-10-CM | POA: Insufficient documentation

## 2019-12-10 DIAGNOSIS — I2510 Atherosclerotic heart disease of native coronary artery without angina pectoris: Secondary | ICD-10-CM | POA: Insufficient documentation

## 2019-12-10 DIAGNOSIS — Z1231 Encounter for screening mammogram for malignant neoplasm of breast: Secondary | ICD-10-CM | POA: Insufficient documentation

## 2019-12-10 DIAGNOSIS — Z1211 Encounter for screening for malignant neoplasm of colon: Secondary | ICD-10-CM | POA: Insufficient documentation

## 2019-12-10 DIAGNOSIS — I10 Essential (primary) hypertension: Secondary | ICD-10-CM

## 2019-12-10 DIAGNOSIS — R0609 Other forms of dyspnea: Secondary | ICD-10-CM

## 2019-12-10 DIAGNOSIS — Z86010 Personal history of colonic polyps: Secondary | ICD-10-CM | POA: Insufficient documentation

## 2019-12-10 NOTE — Patient Instructions (Addendum)
Thank you for choosing [facility] for your care. You saw Ether Griffins, MD, MPH today at [facility]. Please call [phone] option #2 with any urgent questions or concerns.      Heart: glad that you have more energy  - Call cardiac rehab at [facility] to schedule appointment -- I think this will be a very good thing  - Cardiology Dr Bridgett Larsson general heart follow up next month 8/12    I will check with Dr Lebron Quam about the potassium citrate for kidney stone prevention. He recommended Litholyte and it looks like he tried to mail you something but possible got mis-directed in the mail.  - Website [email or web address]  - He wants to repeat kidney ultrasound plus 24-hour urine collection in November  - Follow up with him after that (no appointment scheduled yet)    Ears look clear, just a little wax closer to the outside of the ear canals    Mammogram: sometime this year, okay to schedule for a Thursday when you're here at [facility]    Colonoscopy: sometime in 2021, referral ordered

## 2019-12-21 ENCOUNTER — Encounter (HOSPITAL_BASED_OUTPATIENT_CLINIC_OR_DEPARTMENT_OTHER): Payer: Self-pay | Admitting: Internal Medicine

## 2019-12-21 DIAGNOSIS — N200 Calculus of kidney: Secondary | ICD-10-CM

## 2019-12-21 MED ORDER — POTASSIUM CITRATE ER 10 MEQ (1080 MG) OR TBCR
10.0000 meq | EXTENDED_RELEASE_TABLET | Freq: Three times a day (TID) | ORAL | 5 refills | Status: DC
Start: 2019-12-21 — End: 2020-06-28

## 2019-12-22 ENCOUNTER — Other Ambulatory Visit (HOSPITAL_BASED_OUTPATIENT_CLINIC_OR_DEPARTMENT_OTHER): Payer: Self-pay | Admitting: Physician Assistant

## 2019-12-22 ENCOUNTER — Encounter (INDEPENDENT_AMBULATORY_CARE_PROVIDER_SITE_OTHER): Payer: Self-pay | Admitting: Physician Assistant

## 2019-12-22 DIAGNOSIS — N200 Calculus of kidney: Secondary | ICD-10-CM

## 2020-01-21 ENCOUNTER — Encounter (HOSPITAL_BASED_OUTPATIENT_CLINIC_OR_DEPARTMENT_OTHER): Payer: Medicare HMO | Admitting: Cardiovascular Disease

## 2020-01-26 ENCOUNTER — Encounter (HOSPITAL_BASED_OUTPATIENT_CLINIC_OR_DEPARTMENT_OTHER): Payer: Self-pay | Admitting: Cardiovascular Disease

## 2020-01-26 ENCOUNTER — Encounter (HOSPITAL_BASED_OUTPATIENT_CLINIC_OR_DEPARTMENT_OTHER): Payer: Self-pay | Admitting: Internal Medicine

## 2020-01-26 NOTE — Telephone Encounter (Signed)
Urology: Wendy [name] asking about lower cost option to K citrate? Not sure if something different was sent (Litholyte)? Please respond on MyChart.    Thanks.

## 2020-02-04 ENCOUNTER — Telehealth (HOSPITAL_BASED_OUTPATIENT_CLINIC_OR_DEPARTMENT_OTHER): Payer: Self-pay | Admitting: Nursing

## 2020-02-04 NOTE — Telephone Encounter (Signed)
Left detailed VM for call back to discuss if unable to find potassium citrate at better price, then will need telemed appt with PA to discuss other options.    Awaiting call back.

## 2020-02-29 ENCOUNTER — Encounter (HOSPITAL_BASED_OUTPATIENT_CLINIC_OR_DEPARTMENT_OTHER): Payer: Self-pay | Admitting: Urology

## 2020-03-03 ENCOUNTER — Encounter (HOSPITAL_BASED_OUTPATIENT_CLINIC_OR_DEPARTMENT_OTHER): Payer: Self-pay | Admitting: Cardiovascular Disease

## 2020-03-03 ENCOUNTER — Ambulatory Visit: Payer: Medicare HMO | Attending: Cardiovascular Disease | Admitting: Cardiovascular Disease

## 2020-03-03 VITALS — BP 150/56 | HR 60 | Temp 97.7°F | Wt 218.2 lb

## 2020-03-03 DIAGNOSIS — Z789 Other specified health status: Secondary | ICD-10-CM | POA: Insufficient documentation

## 2020-03-03 DIAGNOSIS — Z951 Presence of aortocoronary bypass graft: Secondary | ICD-10-CM

## 2020-03-03 DIAGNOSIS — I2583 Coronary atherosclerosis due to lipid rich plaque: Secondary | ICD-10-CM | POA: Insufficient documentation

## 2020-03-03 DIAGNOSIS — I2510 Atherosclerotic heart disease of native coronary artery without angina pectoris: Secondary | ICD-10-CM | POA: Insufficient documentation

## 2020-03-03 NOTE — Patient Instructions (Signed)
Glad you're enjoying CR so much.  I hope you are able to continue on with exercise when the program concludes.  Please send me the name/clinic name of your Kidney Doctor, I will ask him if we can start Lisinopril.  I will see you in 6 months.

## 2020-03-03 NOTE — Progress Notes (Signed)
Cardiology Follow-up    CC: Follow-up CAD/PCI.    HISTORY OF PRESENT ILLNESS:  Wendy [name] returns in f/u having undergone PCI by Dr. [name] which resolved her sx, and allowed her to enjoy her daughter's wedding.  She has been doing CR and really enjoying it.  Not doing as much walking on her own tho.  Her BP seems to respond to exercise in that she is 094-709 systolic on arrival to CR but lower after.  She denies any CP (wasn't her angina), LEE, Palpitations, Syncope.    Past History: Wendy [name] is a 74 y/o W with h/o CAD, s/p CABG (2006.  LIMA-LAD, SVG-dRCA (with endarterectomy of RPDA and PL), SeqSVG-RI-D1, SVG-D3).  Back in 2006 she had DOE and was worked up for CAD, ultimately had a cath and then CABG.  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  She has a h/o statin intolerance.  More recently she has developed DOE and has been limiting her activity.  As a result her PCP, Dr. Ronalee Red, ordered a DSE.  This showed inducible ischemia and reproduced her symptoms.  This led to PCI of SVG-->D1.    PAST MEDICAL HISTORY/PROBLEM LIST:  Patient Active Problem List    Diagnosis Date Noted    Dyspnea on exertion [R06.00] 11/12/2019     Added automatically from request for surgery [id number]      Esophageal dysphagia [R13.10] 08/24/2019    Gastroesophageal reflux disease without esophagitis [K21.9] 08/24/2019    Body mass index 40.0-44.9, adult ([place]) [Z68.41] 07/16/2019    Primary osteoarthritis of right knee [M17.11] 07/16/2019    Hx of CABG [Z95.1]      CABG (2006.  LIMA-LAD, SVG-dRCA (with endarterectomy of RPDA and PL), SeqSVG-RI-D1, SVG-D3)   Cath 2021 with lesion in SVG-Diagonal s/p PCI, occluded graft to ramus/diagonal, disease at the bifurcation of RCA graft insertion managed medically at that time as sx improving.       Essential hypertension [I10] 06/08/2019    Asthma [J45.909] 06/08/2019    Bipolar disorder without  psychotic features ([place]) [F31.9] 06/08/2019    Atherosclerosis of native coronary artery of native heart [I25.10] 06/08/2019     Multiple vessel 2006  Abnormal DSE 10/2019  Cath 11/18/19 PCI SVG-diagonal      Colon polyps [K63.5] 06/08/2019    Depression [F32.9] 06/08/2019    Generalized anxiety disorder [F41.1] 06/08/2019    Migraine [G43.909] 06/08/2019    Thyroid disease [E07.9] 06/08/2019    Vitamin D deficiency [E55.9] 06/08/2019    Mixed hyperlipidemia [E78.2]      Cannot tolerate statins      Hyperparathyroidism ([place]) [E21.3]     Osteoporosis without current pathological fracture [M81.0]     Nephrolithiasis [N20.0] 04/27/2019    Non-functioning R kidney [N28.9] 01/22/2019     Due to ureteral stone      Pulmonary nodule less than 6 cm determined by computed tomography of lung [R91.1] 01/09/2019     R lung 5 mm nodule incidental finding [facility] [place])      Abnormal computed tomography of pelvis [R93.5] 01/09/2019     Probable fibroid      Diastolic dysfunction [G28.36] 08/17/2017     Formatting of this note might be different from the original.   Normal left ventricular systolic function, ejection fraction 65 to 62%   Diastolic dysfunction - grade II (elevated filling pressures)   Dilated left atrium - mild   Degenerative mitral valve disease  Mitral annular calcification   Aortic stenosis - mild   Aortic regurgitation - moderate   Normal right ventricular systolic function    ECHO 2017      History of R parathyroidectomy (2017) [Z90.09] 02/07/2016    Aortic stenosis, mild [I35.0] 01/08/2016     02/25/18 TTE Mild aortic stenosis with mild-moderate aorticregurgitation      Aortic regurgitation [I35.1] 01/08/2016     02/25/18 TTE mild-moderate      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    History of R metatarsal fractures [Z87.81] 2017     2017 R 2nd, 3rd, 4th proximal metatarsals       ALL:  Review of patient's allergies indicates:  Allergies   Allergen Reactions     Hydrochlorothiazide Skin: Itching and Throat Swelling    Lidocaine Other     Hallucinations    Penicillins Skin: Hives     Reaction in [place] (??)  Has patient had a PCN reaction causing immediate rash, facial/tongue/throat swelling, SOB or lightheadedness with hypotension: Yes  Has patient had a PCN reaction causing severe rash involving mucus membranes or skin necrosis: No  Has patient had a PCN reaction that required hospitalization: No  Has patient had a PCN reaction occurring within the last 10 years: No  If all of the above answers are "NO", then may proceed with Cephalosporin use.      Coconut Fatty Acids      Other reaction(s): Other (See Comments)  Reaction??    Latex Skin: Itching    Losartan Headache and Other     Other reaction(s): Other (See Comments), Unknown  Pt doesn't remember.  High blood pressure    Statins Headache and Other     Other reaction(s): Other (See Comments), Other (See Comments) high blood pressure       MEDS:  Outpatient Medications Prior to Visit   Medication Sig Dispense Refill    amLODIPine 10 MG tablet Take 1 tablet (10 mg) by mouth daily. 90 tablet 2    aspirin 81 MG EC tablet Take 81 mg by mouth daily.      clopidogrel 75 MG tablet Take 1 tablet (75 mg) by mouth daily. 30 tablet 11    fexofenadine 180 MG tablet Take 1 tablet (180 mg) by mouth daily. For allergies 30 tablet 2    metoprolol succinate ER 50 MG 24 hr tablet Take 1 tablet (50 mg) by mouth daily. 90 tablet 2    nitroGLYCERIN 0.4 MG SL tablet Place 1 tablet (0.4 mg) under the tongue every 5 minutes as needed for chest pain for up to 3 doses. If no relief, call 911. 25 tablet 6    potassium citrate ER 10 MEQ (1080 MG) ER tablet Take 1 tablet (10 mEq) by mouth 3 times a day with meals. 90 tablet 5    spironolactone 25 MG tablet Take 1 tablet (25 mg) by mouth daily. 90 tablet 3     No facility-administered medications prior to visit.        PHYSICAL EXAM:  Vitals:    03/03/20 1403   BP: (!) 150/56    BP Cuff Size: Regular Long   BP Site: Left Arm   BP Position: Sitting   Pulse: 60   Temp: 36.5 C   TempSrc: Temporal   SpO2: 98%   Weight: 99 kg (218 lb 3.2 oz)     Gen: WD, WN woman, NAD  E: Anicteric  Neuro: Grossly nonfocal  CV: RRR SEM, soft diastolic murmur in aortic area, PMI not displaced, no LEE.  P: Normal M & A  S: No rashes    OTHER DATA:    Results for orders placed or performed during the hospital encounter of 93/23/55   Basic Metabolic Panel   Result Value Ref Range    Sodium 140 135 - 145 meq/L    Potassium 4.0 3.6 - 5.2 meq/L    Chloride 105 98 - 108 meq/L    Carbon Dioxide, Total 28 22 - 32 meq/L    Anion Gap 7 4 - 12    Glucose 94 62 - 125 mg/dL    Urea Nitrogen 18 8 - 21 mg/dL    Creatinine 0.92 0.38 - 1.02 mg/dL    Calcium 9.1 8.9 - 10.2 mg/dL    eGFR, Calculated >60 >59 mL/min/[1.73_m2]    GFR, Information       Calculated GFR by CKD-EPI equation. Inaccurate with changing renal function. See [email or web address].     DSE 11/03/2019:  Conclusion  1. Patient developed chest pain at peak stress, described to be similar to  the pain she felt before having her CABG, that resolved during recovery.   2. Baseline ECG shows normal sinus rhythm with nonspecific ST/T-wave changes.  At peak stress, there are inferior (III, aVF) ST elevations with reciprocal  ST depressions in leads I and aVL.   3. Baseline TTE shows normal LV size and systolic function (biplane EF 65%)  with no regional wall motion abnormalities. There is mild mitral annular  calcification, mild-moderate aortic stenosis, and mild-moderate aortic  regurgitation.  4. At peak stress, there is hypokinesis to akinesis in the myocardial  segments as diagrammed.   5. In summary, there is ECG and echocardiographic evidence of inducible  ischemia.    11/18/19:  Impression:  1. Successful IVUS guided PCI of SVG-diagonal with 3.0(30) mm Resolute Onyx DES.  2. Diagnostic angiography demonstrating severe disease in  SVG-diagonal and SVG-rPDA, occluded SVG-RI-diagonal, and patent LIMA-LAD.  3. Successful ultrasound guided 78F RFA access.  4. Perclose to the RFA.    Recommendations:  1. Patient initially loaded with ticagrelor 180 mg intraprocedure. Reloaded with 300 mg Plavix PO prior to discharge this evening. Continue Plavix 75 mg daily for at least 1 year.  2. Continue ASA 81 mg daily indefinitely.  3. Follow up visits with Dr. Bridgett Larsson  in 2 weeks to discuss further interventions (CTO PCI of ramus, CTO PCI of RCA) and we can rediscuss with her if clinically indicated.    IMPRESSION/RECOMMENDATIONS: 74 y/o W with CAD s/p CABG with recurrent anginal equivalent DOE and abnormal DSE now s/p PCI to SVG-->Diagonal on 11/18/19.    CAD: Stable, w/o current angina (equivalent) still doing CR.  Continue w/ current regimen and defer any further PCI unless recurrance of sx.    HTN: BP not optimally controlled.  Have written to Neprologist to see she can start an ACEi.    AS/AR: Both mild-mod on 11/03/19.  Next TTE in 1-2 years.    Lipids.  Statin intolerant.  Referred to Lipid clinic ([facility]).    Follow-up 6 mo.    I spent a total of 30 minutes for the patient's care on the date of the service.

## 2020-03-07 ENCOUNTER — Encounter (HOSPITAL_BASED_OUTPATIENT_CLINIC_OR_DEPARTMENT_OTHER): Payer: Self-pay | Admitting: Cardiovascular Disease

## 2020-03-08 ENCOUNTER — Telehealth (HOSPITAL_BASED_OUTPATIENT_CLINIC_OR_DEPARTMENT_OTHER): Payer: Self-pay | Admitting: Urology

## 2020-03-08 ENCOUNTER — Encounter (HOSPITAL_BASED_OUTPATIENT_CLINIC_OR_DEPARTMENT_OTHER): Payer: Self-pay | Admitting: Cardiovascular Disease

## 2020-03-08 NOTE — Telephone Encounter (Signed)
Called and left voicemail for patient in regards to scheduling a telemedicine appointment with Dr. Lebron Quam to review patient's Litholink results.  The phone number for the [facility] front desk was left for the patient to call and schedule.    CCR Instructions:  Please transfer pt to the [facility] front desk to schedule their appointment.

## 2020-03-10 NOTE — Telephone Encounter (Signed)
Pt is scheduled for a TeleHealth Visit with Dr.Sweet to review Litholink results. Pt also wanted to discuss with Dr.Sweet about whether it is okay to take an ace inhibitor per recommendation from her cardiologist. Advised pt that she can further discuss this with Dr.Sweet during her appt on Monday.

## 2020-03-11 ENCOUNTER — Encounter (HOSPITAL_BASED_OUTPATIENT_CLINIC_OR_DEPARTMENT_OTHER): Payer: Self-pay | Admitting: Urology

## 2020-03-14 ENCOUNTER — Telehealth (HOSPITAL_BASED_OUTPATIENT_CLINIC_OR_DEPARTMENT_OTHER): Payer: Medicare HMO | Admitting: Urology

## 2020-03-14 ENCOUNTER — Ambulatory Visit: Payer: Medicare HMO | Attending: Urology | Admitting: Urology

## 2020-03-14 ENCOUNTER — Encounter (HOSPITAL_BASED_OUTPATIENT_CLINIC_OR_DEPARTMENT_OTHER): Payer: Self-pay

## 2020-03-14 ENCOUNTER — Telehealth (HOSPITAL_BASED_OUTPATIENT_CLINIC_OR_DEPARTMENT_OTHER): Payer: Self-pay | Admitting: Urology

## 2020-03-14 DIAGNOSIS — N200 Calculus of kidney: Secondary | ICD-10-CM | POA: Insufficient documentation

## 2020-03-14 NOTE — Progress Notes (Deleted)
[place]  New Patient History and Physical    This encounter was conducted from the [facility] clinic via secure, live, face-to-face video conference. The patient was present at home and also with the patient was her son.     Prior to the interview, the risks, and benefits of telemedicine were discussed with the patient and verbal consent was obtained as documented in the Medical Assistant's note.    CC: Nephrolithiasis    HPI:  Ms. Janijah Symons is a 74 year old female with history of nephrolithiasis.    She underwent left URS, LL, stent with Dr. Lebron Quam 07/01/19 and her left ureteral stent was removed 07/06/19. 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 Her prior 24 hour urine test had identified hypocitraturia and low urine volume as her predominant stone formation risks.  Since her last 24hr urine, she has been trying to increase her water intake, but is no longer putting lemon juice in her water and found that the k-citrate supplement prescribed was too expensive so hasn't been taking it.  She denies any recent flank pain, dysuria, gross hematuria, or other symptoms.    Her cardiologist also wishes to start her on 5" qd of lisinopril and asks if we'd be amenable to this.    The patient presents today with her son for a review of a recently collecting 24hr urine.  REVIEW OF SYSTEMS:  A complete ROS was done other than what is stated in the HPI above.     PSH: She has a past surgical history that includes pr cesarean delivery only (1980, 1982); Coronary angiogram (05/01/2005); Parathyroidectomy (Right, 02/07/2016); pr mgmt lvr hemrrg smpl sutr lvr wnd/inj (1973); Coronary artery bypass graft (2006); Uterine fibroid surgery (2016); and Ureteroscopy and Laser Lithotripsy (Left, 07/01/2019).  PMH: She has a past  medical history of Abnormal computed tomography of pelvis (01/09/2019), Abnormal dobutamine stress echocardiogram (11/03/2019), Anxiety, Aortic insufficiency (01/08/2016), Aortic stenosis (01/08/2016), Asthma, Bipolar disorder ([place]), Cholelithiasis (01/09/2019), Colon polyp, Coronary atherosclerosis of native coronary artery, Diastolic dysfunction, Dyspnea on exertion, Elevated liver enzymes (01/09/2019), Essential hypertension, Gastroesophageal reflux disease, Hydronephrosis concurrent with and due to calculi of kidney and ureter (01/09/2019), Hyperlipidemia, Hyperparathyroidism ([place]) (2017), Liver laceration (1973), Malaria, Metatarsal fracture (2017), Mild cognitive impairment, Nephrolithiasis (01/09/2019), Nonfunctioning kidney (01/22/2019), Osteoporosis, Parathyroid adenoma (01/06/2016), Pneumonia, Pulmonary nodule less than 6 cm determined by computed tomography of lung (01/09/2019), Scleroderma ([place]), Ureteral stone (01/09/2016), and Vitamin D deficiency (01/27/2016).  All: Review of patient's allergies indicates:  Allergies   Allergen Reactions   . Hydrochlorothiazide Skin: Itching and Throat Swelling   . Lidocaine Other     Hallucinations   . Penicillins Skin: Hives     Reaction in [place] (??)  Has patient had a PCN reaction causing immediate rash, facial/tongue/throat swelling, SOB or lightheadedness with hypotension: Yes  Has patient had a PCN reaction causing severe rash involving mucus membranes or skin necrosis: No  Has patient had a PCN reaction that required hospitalization: No  Has patient had a PCN reaction occurring within the last 10 years: No  If all of the above answers are "NO", then may proceed with Cephalosporin use.     . Coconut Fatty Acids      Other reaction(s): Other (See Comments)  Reaction??   .  Latex Skin: Itching   . Losartan Headache and Other     Other reaction(s): Other (See Comments), Unknown  Pt doesn't remember.  High blood pressure   . Statins Headache and Other      Other reaction(s): Other (See Comments), Other (See Comments) high blood pressure     Meds: She has a current medication list which includes the following prescription(s): amlodipine, aspirin, clopidogrel, fexofenadine, metoprolol succinate er, nitroglycerin, potassium citrate er, and spironolactone.Marland Kitchen   FHx: Her family history includes Ankylosing Spondylitis in her maternal uncle; Arthritis in her maternal grandmother; Bipolar Disorder in her brother and mother; Brain Cancer in her sister; Breast Cancer in her paternal aunt; Hypertension in her mother and sister; Myocardial Infarction (age of onset: 7) in her father. There is no history of Osteoporosis or Stroke.  SH: She reports that she has never smoked. She has never used smokeless tobacco. She reports previous alcohol use. She reports previous drug use.    Physical exam  Telehealth visit.    Labs  I reviewed and discussed laboratory results with the patient today.   Cr. 0.92  24-Hour Urine Collection-   Volume 1.34L  PH 6.4  Ca 80  Na 87  Ox 42  Citrate 133  Uric acid 0.50  SS CaOx 7  SSCAP 1.01     Imaging  No new imaging to review at today's visit.    Assessment and Plan  Genesee Nase is a 74 year old woman with a functionally solitary left kidney and recurrent urolithiasis s/p L URS, LL, stent with Dr. Lebron Quam 07/01/19 whose primary risk factors for stone formation include low urine volume and hypocitruria.      We discussed different general strategies for stone prevention:    1. Increase fluid intake for a goal of >2.5 L of urine output daily. Try to keep urine clear or clear yellow. It can also be helpful to carry a water bottle throughout the day  2. Decrease sodium intake to less than 2300 mg per day  3. Add citrate to the diet by adding lemon juice to their water and potentially taking potassium citrate supplementation  4. Decrease the amount of sugar (this can lead to high calcium in the urine)  5. Consider a decrease in animal protein intake    Of  these items, the most important for [place] are increasing fluid intake and adding citrate.  The patient expressed good understanding with our plan.  Plan to see her back in 75months and a repeat 24hr urine.    -RTC 73mo w/ 24hr urine

## 2020-03-14 NOTE — Progress Notes (Signed)
[place]  New Patient History and Physical    This encounter was conducted from the [facility] clinic via secure, live, face-to-face video conference. The patient was present at home and also with the patient was her son.     Prior to the interview, the risks, and benefits of telemedicine were discussed with the patient and verbal consent was obtained as documented in the Medical Assistant's note.    CC: Nephrolithiasis    HPI:  [name] is a 74 year old female with history of nephrolithiasis.    She underwent left URS, LL, stent with Dr. Lebron Quam 07/01/19 and her left ureteral stent was removed 07/06/19. 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 Her prior 24 hour urine test had identified hypocitraturia and low urine volume as her predominant stone formation risks.  Since her last 24hr urine, she has been trying to increase her water intake, but is no longer putting lemon juice in her water and found that the k-citrate supplement prescribed was too expensive so hasn't been taking it.  She denies any recent flank pain, dysuria, gross hematuria, or other symptoms.    Her cardiologist also wishes to start her on 5" qd of lisinopril and asks if we'd be amenable to this.    The patient presents today with her son for a review of a recently collecting 24hr urine.  REVIEW OF SYSTEMS:  A complete ROS was done other than what is stated in the HPI above.     PSH: She has a past surgical history that includes pr cesarean delivery only (1980, 1982); Coronary angiogram (05/01/2005); Parathyroidectomy (Right, 02/07/2016); pr mgmt lvr hemrrg smpl sutr lvr wnd/inj (1973); Coronary artery bypass graft (2006); Uterine fibroid surgery (2016); and Ureteroscopy and Laser Lithotripsy (Left, 07/01/2019).  PMH: She has a past  medical history of Abnormal computed tomography of pelvis (01/09/2019), Abnormal dobutamine stress echocardiogram (11/03/2019), Anxiety, Aortic insufficiency (01/08/2016), Aortic stenosis (01/08/2016), Asthma, Bipolar disorder ([place]), Cholelithiasis (01/09/2019), Colon polyp, Coronary atherosclerosis of native coronary artery, Diastolic dysfunction, Dyspnea on exertion, Elevated liver enzymes (01/09/2019), Essential hypertension, Gastroesophageal reflux disease, Hydronephrosis concurrent with and due to calculi of kidney and ureter (01/09/2019), Hyperlipidemia, Hyperparathyroidism ([place]) (2017), Liver laceration (1973), Malaria, Metatarsal fracture (2017), Mild cognitive impairment, Nephrolithiasis (01/09/2019), Nonfunctioning kidney (01/22/2019), Osteoporosis, Parathyroid adenoma (01/06/2016), Pneumonia, Pulmonary nodule less than 6 cm determined by computed tomography of lung (01/09/2019), Scleroderma ([place]), Ureteral stone (01/09/2016), and Vitamin D deficiency (01/27/2016).  All:   Review of patient's allergies indicates:  Allergies   Allergen Reactions   . Hydrochlorothiazide Skin: Itching and Throat Swelling   . Lidocaine Other     Hallucinations   . Penicillins Skin: Hives     Reaction in [place] (??)  Has patient had a PCN reaction causing immediate rash, facial/tongue/throat swelling, SOB or lightheadedness with hypotension: Yes  Has patient had a PCN reaction causing severe rash involving mucus membranes or skin necrosis: No  Has patient had a PCN reaction that required hospitalization: No  Has patient had a PCN reaction occurring within the last 10 years: No  If all of the above answers are "NO", then may proceed with Cephalosporin use.     . Coconut Fatty Acids      Other reaction(s): Other (See Comments)  Reaction??   .  Latex Skin: Itching   . Losartan Headache and Other     Other reaction(s): Other (See Comments), Unknown  Pt doesn't remember.  High blood pressure   . Statins Headache and Other      Other reaction(s): Other (See Comments), Other (See Comments) high blood pressure     Meds: She has a current medication list which includes the following prescription(s): amlodipine, aspirin, clopidogrel, fexofenadine, metoprolol succinate er, nitroglycerin, potassium citrate er, and spironolactone.Marland Kitchen   FHx: Her family history includes Ankylosing Spondylitis in her maternal uncle; Arthritis in her maternal grandmother; Bipolar Disorder in her brother and mother; Brain Cancer in her sister; Breast Cancer in her paternal aunt; Hypertension in her mother and sister; Myocardial Infarction (age of onset: 45) in her father. There is no history of Osteoporosis or Stroke.  SH: She reports that she has never smoked. She has never used smokeless tobacco. She reports previous alcohol use. She reports previous drug use.    Physical exam  Telehealth visit.    Labs  I reviewed and discussed laboratory results with the patient today.   Cr. 0.92  24-Hour Urine Collection-   Volume 1.34L  PH 6.4  Ca 80  Na 87  Ox 42  Citrate 133  Uric acid 0.50  SS CaOx 7  SSCAP 1.01     Imaging  No new imaging to review at today's visit.    Assessment and Plan  [name] is a 74 year old woman with a functionally solitary left kidney and recurrent urolithiasis s/p L URS, LL, stent with Dr. Lebron Quam 07/01/19 whose primary risk factors for stone formation include low urine volume and hypocitruria.      We discussed different general strategies for stone prevention:    1. Increase fluid intake for a goal of >2.5 L of urine output daily. Try to keep urine clear or clear yellow. It can also be helpful to carry a water bottle throughout the day  2. Decrease sodium intake to less than 2300 mg per day  3. Add citrate to the diet by adding lemon juice to their water and potentially taking potassium citrate supplementation (info provided here)  4. Decrease the amount of sugar (this can lead to high calcium in the urine)  5. Consider a decrease in animal  protein intake  6. After consultation with our clinic nephrologist, Dr. Ellouise Newer, [place] to start lisinopril.    Of these items, the most important for [place]ennia are increasing fluid intake and adding citrate.  The patient expressed good understanding with our plan.  Plan to see her back in 25months and a repeat 24hr urine.    -RTC 32mo w/ 24hr urine    I, Dr. Jani Files, saw and evaluated this patient in a combined visit with Dr. Danae Orleans, MD. I participated in all key parts of the visit including the medical decision making. I reviewed the documentation provided by Dr. Marcelline Deist, and I agree with the plan of care as outlined.

## 2020-03-14 NOTE — Patient Instructions (Signed)
Follow-up 3 months with APPs      Litholyte 85meq twice a day.    Tips for Increasing Fluids  When increasing fluids, you may find that large increases in intake can result in excess urination.  This can be overcome by:  Marland Kitchen Sipping fluids over time  o Keep a water bottle nearby whether at work, home or driving  o Set a goal for the number of ounces to drink per hour/by noon/between lunch and dinner, etc.  . Add 4 to 8 ounces of fluid to your current intake  o In about one week your body will adapt, then add 4 to 8 more ounces  o Continue increasing until you get to goal  Tips for adding citrate:  Marland Kitchen Use lemon juice. Dilute 2 ozs. lemon juice with 10 ozs. water and drink twice a day - once in the morning and once in the evening - to reach the goal of 4 ozs. lemon juice per day.  Marland Kitchen Squeeze fresh lemon or lime juice directly into your soda, fruit juice, tea, or water.  Marland Kitchen Squeeze fresh lemon or lime (or concentrate) into ice cube trays before freezing.  Fill trays almost full with water. Then, squeeze half a lemon or more over the tray, and freeze. Add these cubes to plain tap water and other beverages. .   . Drink lemonade every day. Depending on your urinary citrate concentration, 16-32 ozs. daily may be recommended, distributed throughout the day.  . To make homemade lemonade, squeeze a  cup (4 ounces) of fresh lemon juice into a pitcher of cold water; bottled lemon juice may also be used. Add sugar or sugar substitute, if desired. Make it by the glass for a single serving.  . Use commercially available lemonade products and mixes. It is recommended to use low-calorie lemonades and lemonade mixes (such as Minute Maid Light, Tropicana Light, or Crystal Light). These are high in citric acid but have little sugar and few calories. Of the commercially-available lemonade products, those that are ready-to-consume have more citric acid than those that come as a powder.  . Make a lemon or lime spritzer by pouring 2 cups fresh  lemon or lime juice (from about 9 medium size lemons or limes; or use lemon or lime juice concentrate) into a large pitcher, and add 1 cup Splenda, a no-calorie sweetener made from sugar. When sweetener is fully dissolved, add 1 liter chilled club soda, thin lemon & lime slices, and a few ice cubes. Stir and chill thoroughly before serving.    One serving of per day effect on Stone Risk   INCREASED RISK NEUTRAL RISK DECREASED RISK   Sugar Sweetened Cola  Apple Juice Coffee   Sugar Sweetened Clear Soda Grapefruit Juice Orange Juice   Fruit Punch  Tomato Juice Wine    Artificially Sweetened Sodas Beer    Liquor Tea    Water

## 2020-03-14 NOTE — Telephone Encounter (Signed)
Spoke with pt let her know if we could schedule pt to 10:40am pt stated that she was not able to as she has cardiac rehabilitation, let pt know that we could move her up to 4pm instead pt stated that was fine.

## 2020-03-17 ENCOUNTER — Encounter (HOSPITAL_BASED_OUTPATIENT_CLINIC_OR_DEPARTMENT_OTHER): Payer: Self-pay | Admitting: Internal Medicine

## 2020-03-17 ENCOUNTER — Ambulatory Visit: Payer: Medicare HMO | Attending: Internal Medicine | Admitting: Internal Medicine

## 2020-03-17 VITALS — BP 154/50 | HR 55 | Temp 97.3°F | Wt 217.0 lb

## 2020-03-17 DIAGNOSIS — Z8639 Personal history of other endocrine, nutritional and metabolic disease: Secondary | ICD-10-CM | POA: Insufficient documentation

## 2020-03-17 DIAGNOSIS — Z86018 Personal history of other benign neoplasm: Secondary | ICD-10-CM

## 2020-03-17 DIAGNOSIS — J309 Allergic rhinitis, unspecified: Secondary | ICD-10-CM | POA: Insufficient documentation

## 2020-03-17 DIAGNOSIS — L219 Seborrheic dermatitis, unspecified: Secondary | ICD-10-CM | POA: Insufficient documentation

## 2020-03-17 DIAGNOSIS — L299 Pruritus, unspecified: Secondary | ICD-10-CM | POA: Insufficient documentation

## 2020-03-17 DIAGNOSIS — E892 Postprocedural hypoparathyroidism: Secondary | ICD-10-CM | POA: Insufficient documentation

## 2020-03-17 DIAGNOSIS — M542 Cervicalgia: Secondary | ICD-10-CM | POA: Insufficient documentation

## 2020-03-17 DIAGNOSIS — R221 Localized swelling, mass and lump, neck: Secondary | ICD-10-CM | POA: Insufficient documentation

## 2020-03-17 DIAGNOSIS — I2510 Atherosclerotic heart disease of native coronary artery without angina pectoris: Secondary | ICD-10-CM | POA: Insufficient documentation

## 2020-03-17 DIAGNOSIS — Z23 Encounter for immunization: Secondary | ICD-10-CM | POA: Insufficient documentation

## 2020-03-17 DIAGNOSIS — E213 Hyperparathyroidism, unspecified: Secondary | ICD-10-CM | POA: Insufficient documentation

## 2020-03-17 MED ORDER — KETOCONAZOLE 2 % EX SHAM
MEDICATED_SHAMPOO | CUTANEOUS | 2 refills | Status: DC
Start: 2020-03-17 — End: 2020-06-30

## 2020-03-17 MED ORDER — INFLUENZA VAC A&B SA ADJ QUAD 0.5 ML IM PRSY
0.5000 mL | PREFILLED_SYRINGE | Freq: Once | INTRAMUSCULAR | Status: AC
Start: 2020-03-17 — End: 2020-03-17
  Administered 2020-03-17: 0.5 mL via INTRAMUSCULAR

## 2020-03-17 MED ORDER — CLOPIDOGREL BISULFATE 75 MG OR TABS
75.0000 mg | ORAL_TABLET | Freq: Every day | ORAL | 11 refills | Status: DC
Start: 2020-03-17 — End: 2020-06-28

## 2020-03-17 MED ORDER — LISINOPRIL 5 MG OR TABS
5.0000 mg | ORAL_TABLET | Freq: Every day | ORAL | 1 refills | Status: DC
Start: 2020-03-17 — End: 2020-06-14

## 2020-03-17 MED ORDER — INFLUENZA VAC A&B SA ADJ QUAD 0.5 ML IM PRSY
PREFILLED_SYRINGE | INTRAMUSCULAR | Status: AC
Start: 2020-03-17 — End: ?
  Filled 2020-03-17: qty 0.5

## 2020-03-17 NOTE — Progress Notes (Signed)
SENIOR CARE SUBSEQUENT CARE VISIT NOTE     ID/CC:  74 year old community dwelling female with PMH CAD s/p CABG and PCI, asthma, bipolar disorder, hx of R parathyroidectomy being seen for routine follow-up:     Interval History:  Baseline lives alone at [facility].  Unaccompanied today.  Interpreter or not: NA     Concerns today:    #Throat pain  Has been having sore throat in the mornings -- back of throat and anteriorly, below jaw hurts up to ears. Chronic issue but has been worsening. Having some difficulty swallowing. More difficult with solid food but sometimes with liquids. She has history of asthma, sometimes wheezes. Sore throat improves with cough drops. Hasn't been taking PPI or antihistamine meds. Doesn't like taking meds if she doesn't have to. Has allergies to coconut, wondering if she got exposed to this. Denies lip or tongue swelling.    #Itchy back, scalp  Tells me she has history of psoriasis. Has dandruff and has shampoo for this. Hasn't tried anything for her itchy back.    #CAD  Feeling great since her PCI. Loves cardiac rehab. Has been walking a lot more. Found a new [place] place in [place] she plans to check out. Has not been taking her clopidogrel since having stent placed. Was told to start lisinopril but she hasn't seen this yet    #Preventive med  - Flu shot? interested  - COVID booster? Had [place]  - Shingrix: hasn't gotten yet    Geriatric ROS:  ADLs: independent  IADLS: independent  Cognition: no acute concerns  Mood: not assessed  Mobility: no acute concerns  Vision: not assessed  Hearing:  not assessed  Continence: not assessed, no issues previously  Frailty index/CFS-9: 2    Problem List  Patient Active Problem List    Diagnosis Date Noted   . Nephrolithiasis 04/27/2019   . Non-functioning R kidney 01/22/2019     Due to ureteral stone     . Essential hypertension 06/08/2019   . Atherosclerosis of native coronary artery of native heart 06/08/2019     Multiple  vessel 2006  Abnormal DSE 10/2019  Cath 11/18/19 PCI SVG-diagonal     . Hyperparathyroidism ([place])    . Osteoporosis without current pathological fracture    . Aortic stenosis, mild 01/08/2016     02/25/18 TTE Mild aortic stenosis with mild-moderate aorticregurgitation     . Aortic regurgitation 01/08/2016     02/25/18 TTE mild-moderate     . Esophageal dysphagia 08/24/2019   . Gastroesophageal reflux disease without esophagitis 08/24/2019   . Body mass index 40.0-44.9, adult ([place]) 07/16/2019   . Primary osteoarthritis of right knee 07/16/2019   . Hx of CABG      CABG (2006.  LIMA-LAD, SVG-dRCA (with endarterectomy of RPDA and PL), SeqSVG-RI-D1, SVG-D3)   Cath 2021 with lesion in SVG-Diagonal s/p PCI, occluded graft to ramus/diagonal, disease at the bifurcation of RCA graft insertion managed medically at that time as sx improving.      . Asthma 06/08/2019   . Bipolar disorder without psychotic features ([place]) 06/08/2019   . Colon polyps 06/08/2019   . Depression 06/08/2019   . Generalized anxiety disorder 06/08/2019   . Migraine 06/08/2019   . Thyroid disease 06/08/2019   . Vitamin D deficiency 06/08/2019   . Mixed hyperlipidemia      Cannot tolerate statins     . Pulmonary nodule less than 6 cm determined by computed  tomography of lung 01/09/2019     R lung 5 mm nodule incidental finding [facility] [place])     . Abnormal computed tomography of pelvis 01/09/2019     Probable fibroid     . Diastolic dysfunction 82/42/3536     Formatting of this note might be different from the original.   Normal left ventricular systolic function, ejection fraction 65 to 14%   Diastolic dysfunction - grade II (elevated filling pressures)   Dilated left atrium - mild   Degenerative mitral valve disease   Mitral annular calcification   Aortic stenosis - mild   Aortic regurgitation - moderate   Normal right ventricular systolic function    ECHO 2017     . History of R parathyroidectomy (2017) 02/07/2016   . 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 History of R metatarsal fractures 2017     2017 R 2nd, 3rd, 4th proximal metatarsals          Outpatient Medication List:  Current Outpatient Medications   Medication Sig Dispense Refill   . amLODIPine 10 MG tablet Take 1 tablet (10 mg) by mouth daily. 90 tablet 2   . aspirin 81 MG EC tablet Take 81 mg by mouth daily.     . clopidogrel 75 MG tablet Take 1 tablet (75 mg) by mouth daily. (Patient not taking: Reported on 03/14/2020) 30 tablet 11   . fexofenadine 180 MG tablet Take 1 tablet (180 mg) by mouth daily. For allergies (Patient not taking: Reported on 03/14/2020) 30 tablet 2   . metoprolol succinate ER 50 MG 24 hr tablet Take 1 tablet (50 mg) by mouth daily. 90 tablet 2   . nitroGLYCERIN 0.4 MG SL tablet Place 1 tablet (0.4 mg) under the tongue every 5 minutes as needed for chest pain for up to 3 doses. If no relief, call 911. (Patient not taking: Reported on 03/14/2020) 25 tablet 6   . potassium citrate ER 10 MEQ (1080 MG) ER tablet Take 1 tablet (10 mEq) by mouth 3 times a day with meals. (Patient not taking: Reported on 03/14/2020) 90 tablet 5   . spironolactone 25 MG tablet Take 1 tablet (25 mg) by mouth daily. (Patient not taking: Reported on 03/14/2020) 90 tablet 3     No current facility-administered medications for this visit.      Review of patient's allergies indicates:  Allergies   Allergen Reactions   . Hydrochlorothiazide Skin: Itching and Throat Swelling   . Lidocaine Other     Hallucinations   . Penicillins Skin: Hives     Reaction in [place] (??)  Has patient had a PCN reaction causing immediate rash, facial/tongue/throat swelling, SOB or lightheadedness with hypotension: Yes  Has patient had a PCN reaction causing severe rash involving mucus membranes or skin necrosis: No  Has patient had a PCN reaction that required hospitalization: No  Has patient had a PCN reaction occurring within the last 10 years: No  If all of the above answers are "NO", then may proceed with  Cephalosporin use.     . Coconut Fatty Acids      Other reaction(s): Other (See Comments)  Reaction??   . Latex Skin: Itching   . Losartan Headache and Other     Other reaction(s): Other (See Comments), Unknown  Pt doesn't remember.  High blood pressure   . Statins Headache and Other     Other reaction(s): Other (See Comments), Other (See Comments) high blood  pressure      Social History:  Lives: alone in [place]: has involved adult daughter here in [place]  DPOA/LNOK/main contact person/main contact #s: daughter Hulan Saas     Physical Exam:  BP (!) 154/50   Pulse 55   Temp 36.3 C (Temporal)   Wt 98.4 kg (217 lb)   SpO2 94%   BMI 41.00 kg/m   Wt Readings from Last 3 Encounters:   03/17/20 98.4 kg (217 lb)   03/03/20 99 kg (218 lb 3.2 oz)   12/10/19 97.1 kg (214 lb)     GEN: well-appearing woman in NAD  HEENT: NCAT, MMM without pharyngeal erythema or cobblestoning, 2cm soft mobile mass to right of cricoid  Eyes: EOMI, sclera anicteric  CARDIOVASC: RRR, 3/6 systolic murmur best heard at LUSB, no rubs or gallops  CHEST/PULM: CTAB, breathing comfortably on RA  ABD/GI: soft, non-distended  MUSCULOSKEL: no LE swelling, no deformities  SKIN: scalp with areas of dry flaky scale, no rash on back  NEURO: no focal deficits  Gait: gets up from chair easily, stable gait     Assessment and Plan:   74 year old community dwelling female with PMH CAD s/p CABG and PCI, asthma, bipolar disorder, hx of R parathyroidectomy with new complaint of worsening sore throat and is doing well with improved physical activity after PCI.    #Sore throat  #R-sided 2cm mass  Favor post-nasal drip from seasonal allergies. Also consider GERD. Palpated mass most likely reactive lymph node. History of hyperparathyroidism s/p parathyroidectomy. Patient prefers to avoid medicines when possible.  - Neti pot sinus rinses at home  - if unimproving, will consider imaging or further endocrine workup and would also trial PPI    #Seborrheic  dermatitis  Chronic. On scalp and minimally on forehead.  - continue at-home dandruff shampoo  - ketoconazole shampoo twice weekly    #Xerosis  New, on upper back. No evidence of rash.  - recommend avoiding daily soap here  - apply thick moisturizers    #CAD  Improved. Doing well with cardiac rehab after PCI. Spoke with cardiologist and he agrees to start lisinopril.  - lisinopril 47m  - will check BMP in 1 week  - counseled on importance of taking clopidogrel to prevent stent clotting, pt verbalized understanding and will start taking    #5 Relevant Health Care Maintenance:  Labs: BMP in 1 week  Immunizations:   Immunization History   Administered Date(s) Administered   . COVID-19 Moderna mRNA LNP-S 07/03/2019, 08/11/2019   . Influenza quadrivalent MDCK PF (Flucelvax) 04/18/2019   . Influenza quadrivalent PF 07/11/2017   . Influenza trivalent high-dose 03/04/2018   . Pneumococcal conjugate PCV13 (Prevnar 13) 05/21/2014   . Pneumococcal polysaccharide PPSV23 (Pneumovax 23) 11/14/2012   . Tdap 11/14/2012     DEXA: NA known osteoporosis. Last DEXA 07/16/19 next due 07/2021  Colonoscopy: due this year, referral to colonoscopy placed  Mammogram: order placed  #POLST: LNOK daughter Serene  #RTC in: 3 months

## 2020-03-17 NOTE — Patient Instructions (Addendum)
So great to see you today!    1. You received the flu shot in clinic today. Please get the Shingrix vaccine at your local pharmacy when convenient for you.    2. Moisturize dry skin with a thick moisturizer (ones that come in tubs). You don't need to apply soap to these areas every day. Soap can be drying to skin.    3. For scalp apply the ketoconazole shampoo prescribed twice a week.    4. For throat: rinse sinuses with Neti pot daily. As sore throat improves you may use it 3 times a week. Be sure to use distilled water. Call us if your throat is not feeling better. We will send a referral for general acupuncture too.    5. Get your mammogram done when convenient for you.    6. Be sure to take your clopidogrel for the stent your heart!

## 2020-03-17 NOTE — Progress Notes (Signed)
I was present with the medical student. I personally performed the physical examination and medical decision making. I have verified all of the medical student's documentation for this encounter.    Will check with endocrine re: initial recommended imaging for R-sided neck lump (U/S versus other)    (R22.1) Localized swelling, mass or lump of neck  (primary encounter diagnosis)    (M54.2) Anterior neck pain    (E21.3) Hyperparathyroidism (HCC)  Plan: Basic Metabolic Panel    (Z30.86) Coronary artery disease involving native coronary artery of native heart without angina pectoris  Plan: lisinopril 5 MG tablet, Basic Metabolic Panel,         clopidogrel 75 MG tablet    (J30.9) Allergic rhinitis, unspecified seasonality, unspecified trigger    (L29.9) Pruritus    (L21.9) Seborrheic dermatitis  Plan: ketoconazole 2 % shampoo    (Z86.39) History of R parathyroid adenoma  Plan: Basic Metabolic Panel    (V78.4) History of R parathyroidectomy (2017)  Plan: Basic Metabolic Panel    (O96) Need for vaccination  Plan: influenza quadrivalent adjuvanted vaccine         (Fluad) injection 0.5 mL      Lin Givens, MD, MPH

## 2020-03-17 NOTE — Progress Notes (Signed)
Vaccine Screening Questions    Interpreter: No    1. Are you allergic to Latex? NO    2.  Have you had a serious reaction or an allergic reaction to a vaccine?  NO    3.  Currently have a moderate or severe illness, including fever?  NO    4.  Ever had a seizure or any neurological problem associated with a vaccine? (DTaP/TDaP/DTP pertinent) NO    5.  Is patient receiving any live vaccinations today? (Varicella-Chickenpox, MMR-Measles/Mumps/Rubella, Zoster-Shingles, Flumist, Yellow Fever) NOTE: oral rotavirus is exempt  NO    If YES to any of the questions above - Do NOT give vaccine.  Consult with RN or provider in clinic.  (#5 can be YES if all Live vaccine questions are answered NO)    If NO to all questions above - Patient may receive vaccine.    6. Do you need to receive the Flu vaccine today? YES - Additional Flu Questions  Flu Vaccine Screening Questions:    Ever had a serious allergic reaction to eggs?  NO    Ever had Guillain-Barre syndrome associated with a vaccine? NO    Less than 6 months old? NO    If YES to any of the Flu questions above - NO Flu Vaccine to be given.  Patient may consult provider as needed.    If NO to all questions above - Patient may receive Flu Shot ([place])    Is the patient requesting Flumist? NO    If between 6 months and 8 years of age, was flu vaccine received last year?  N/A  If NO to above question:  . Children who are receiving influenza vaccine for the first time - administer 2 doses of the current influenza vaccine (separated by at least 4 weeks).          All patients are encouraged to wait 15 minutes before leaving after receiving any vaccine.    VIS given 03/17/2020 by Melquan Ernsberger Vi H Deyana Wnuk, CMA.

## 2020-03-18 ENCOUNTER — Encounter (HOSPITAL_BASED_OUTPATIENT_CLINIC_OR_DEPARTMENT_OTHER): Payer: Self-pay

## 2020-04-07 ENCOUNTER — Encounter (HOSPITAL_BASED_OUTPATIENT_CLINIC_OR_DEPARTMENT_OTHER): Payer: Self-pay | Admitting: Internal Medicine

## 2020-04-11 NOTE — Telephone Encounter (Signed)
MyChart message not read as of 04/11/20. Sending reminder letter.

## 2020-04-14 ENCOUNTER — Ambulatory Visit: Payer: Medicare HMO

## 2020-04-14 DIAGNOSIS — Z23 Encounter for immunization: Secondary | ICD-10-CM

## 2020-04-29 ENCOUNTER — Encounter (HOSPITAL_BASED_OUTPATIENT_CLINIC_OR_DEPARTMENT_OTHER): Payer: Self-pay | Admitting: Internal Medicine

## 2020-05-03 ENCOUNTER — Encounter (HOSPITAL_COMMUNITY): Payer: Self-pay

## 2020-05-06 ENCOUNTER — Ambulatory Visit: Payer: Medicare HMO | Attending: Internal Medicine

## 2020-05-06 ENCOUNTER — Other Ambulatory Visit (HOSPITAL_BASED_OUTPATIENT_CLINIC_OR_DEPARTMENT_OTHER): Payer: Self-pay | Admitting: Internal Medicine

## 2020-05-06 DIAGNOSIS — Z8639 Personal history of other endocrine, nutritional and metabolic disease: Secondary | ICD-10-CM | POA: Insufficient documentation

## 2020-05-06 DIAGNOSIS — I2510 Atherosclerotic heart disease of native coronary artery without angina pectoris: Secondary | ICD-10-CM

## 2020-05-06 DIAGNOSIS — E213 Hyperparathyroidism, unspecified: Secondary | ICD-10-CM

## 2020-05-06 DIAGNOSIS — E892 Postprocedural hypoparathyroidism: Secondary | ICD-10-CM | POA: Insufficient documentation

## 2020-05-06 DIAGNOSIS — Z86018 Personal history of other benign neoplasm: Secondary | ICD-10-CM

## 2020-05-06 LAB — BASIC METABOLIC PANEL
Anion Gap: 8 (ref 4–12)
Calcium: 9.2 mg/dL (ref 8.9–10.2)
Carbon Dioxide, Total: 26 meq/L (ref 22–32)
Chloride: 104 meq/L (ref 98–108)
Creatinine: 0.97 mg/dL (ref 0.38–1.02)
Glucose: 106 mg/dL (ref 62–125)
Potassium: 4.4 meq/L (ref 3.6–5.2)
Sodium: 138 meq/L (ref 135–145)
Urea Nitrogen: 21 mg/dL (ref 8–21)
eGFR by CKD-EPI: 58 mL/min/{1.73_m2} — ABNORMAL LOW (ref >59)

## 2020-05-09 NOTE — Result Encounter Note (Signed)
Normal K, Ca; renal function stable.

## 2020-05-10 ENCOUNTER — Encounter (HOSPITAL_COMMUNITY): Payer: Self-pay

## 2020-05-26 ENCOUNTER — Other Ambulatory Visit (HOSPITAL_BASED_OUTPATIENT_CLINIC_OR_DEPARTMENT_OTHER): Payer: Self-pay | Admitting: Internal Medicine

## 2020-05-26 DIAGNOSIS — R221 Localized swelling, mass and lump, neck: Secondary | ICD-10-CM

## 2020-05-30 ENCOUNTER — Inpatient Hospital Stay
Admission: RE | Admit: 2020-05-30 | Discharge: 2020-05-30 | Disposition: A | Discharge status: Home / Self Care | Payer: Medicare HMO | Attending: Nuclear Medicine | Admitting: Nuclear Medicine

## 2020-05-30 DIAGNOSIS — M8588 Other specified disorders of bone density and structure, other site: Secondary | ICD-10-CM

## 2020-05-30 DIAGNOSIS — M8000XS Age-related osteoporosis with current pathological fracture, unspecified site, sequela: Secondary | ICD-10-CM | POA: Insufficient documentation

## 2020-06-14 ENCOUNTER — Other Ambulatory Visit (HOSPITAL_BASED_OUTPATIENT_CLINIC_OR_DEPARTMENT_OTHER): Payer: Self-pay | Admitting: Internal Medicine

## 2020-06-14 DIAGNOSIS — I2510 Atherosclerotic heart disease of native coronary artery without angina pectoris: Secondary | ICD-10-CM

## 2020-06-14 MED ORDER — LISINOPRIL 5 MG OR TABS
5.0000 mg | ORAL_TABLET | Freq: Every day | ORAL | 3 refills | Status: DC
Start: 2020-06-14 — End: 2020-12-15

## 2020-06-14 NOTE — Telephone Encounter (Signed)
S/O: Lisinopril refill request, out of medication. Ms. Stehle requesting call when refilled.    Lab Results   Component Value Date    POTASSIUM 4.4 05/06/2020    POTASSIUM 4.0 11/18/2019    POTASSIUM 3.8 08/24/2019       A/P: Lisinopril refilled #90 with 3 refills. [facility] RN please notify Ms. Ledlow rx refilled per request. Thanks.

## 2020-06-14 NOTE — Telephone Encounter (Addendum)
RETURN CALL: Voicemail - Detailed Message      SUBJECT:  Refill Request    NAME OF MEDICATION(S): Lisinopril  DATE NEEDED BY: asap  PRESCRIBING PROVIDER: Patria Mane NAME/LOCATION: [id number] [place] [address] [phone] [place] [phone] [id number]  ADDITIONAL INFORMATION: Patient is completely out of this medication at this time, and would like a call to notify her that the prescription has been sent to the pharmacy.

## 2020-06-22 ENCOUNTER — Telehealth (HOSPITAL_BASED_OUTPATIENT_CLINIC_OR_DEPARTMENT_OTHER): Payer: Self-pay | Admitting: Internal Medicine

## 2020-06-22 DIAGNOSIS — I63411 Cerebral infarction due to embolism of right middle cerebral artery: Secondary | ICD-10-CM

## 2020-06-22 DIAGNOSIS — I6930 Unspecified sequelae of cerebral infarction: Secondary | ICD-10-CM | POA: Insufficient documentation

## 2020-06-22 HISTORY — DX: Cerebral infarction due to embolism of right middle cerebral artery: I63.411

## 2020-06-22 NOTE — Telephone Encounter (Signed)
RETURN CALL: Voicemail - Detailed Message      SUBJECT:  General Message     MESSAGE: patient needs to speak with care team about symptoms she is having, she states she had aura like symptoms of migraine with confusion and not being able to focus. Patient is concerned she states that EMT came and checked her out her BP was 170/82, she is just continuing to have feeling of aura and has been having symptoms of sinus congestion as well.

## 2020-06-22 NOTE — Telephone Encounter (Signed)
SUBJECTIVE  Returned Pt's phone call relating to Pt C/O Aura migraine headache and confusion symptoms.  ASSESSMENT/PLAN:  Left VM  Detailing if it's life threatening to call 911 and other wise, if she's stable to call us back including our call back# [phone].

## 2020-06-22 NOTE — Progress Notes (Signed)
[facility] SUBSEQUENT CARE VISIT NOTE     Chief Complaint   Patient presents with   • Follow-Up      headache, sinus pressure, earache R side       ID/CC:  Wendy [name] is a 74 year old community-dwelling female being seen for headache, sinus congestion, earache. Last visit with me 03/17/20.     HPI:  Baseline: lives alone at [facility] (senior housing)  Accompanied today by daughter  Interpreter or not: NO     Concerns today: Sinus pressure and pain for a couple days. Trying to put pressure on areas which causes sinus drainage. Headache. When coughs feels like scalp is exploding. Feels better if touches top of head. +L earache. +sore throat couple days. Wheezing, feels from dust. Using Ricola and menthol. Felt warm. No loss of sense of smell. COVID-19 vaccinations up-to-date.    Sudden onset feeling dizzy yesterday while at pharmacy, difficulty talking. Couldn't see very well. EMS called. Per daughter BP high systolic 170s, pulse okay. Hadn't had much fluids yesterday.    GERD: asks if can get hospital bed. Has wedge pillow which isn't helping with GERD symptoms. Unable to elevate head of bed.    Kidneys hurting.    Feeling itchy.    Initially seen in regular examination room, moved to negative pressure room due to concern for COVID-19 symptoms above.    Patient Active Problem List   Diagnosis   • Nephrolithiasis   • Hyperparathyroidism (HCC)   • Osteoporosis without current pathological fracture   • Non-functioning R kidney   • Essential hypertension   • Aortic stenosis, mild   • Asthma   • Bipolar disorder without psychotic features (HCC)   • Atherosclerosis of native coronary artery of native heart   • Colon polyps   • Depression   • Generalized anxiety disorder   • Mixed hyperlipidemia   • Migraine   • History of R parathyroid adenoma   • History of R parathyroidectomy (2017)   • Thyroid disease   • Vitamin D deficiency   • Hx of CABG   • Aortic regurgitation   • Pulmonary nodule less than 6  cm determined by computed tomography of lung   • Abnormal computed tomography of pelvis   • Body mass index 40.0-44.9, adult (HCC)   • History of R metatarsal fractures   • Primary osteoarthritis of right knee   • Esophageal dysphagia   • Gastroesophageal reflux disease without esophagitis   • Diastolic dysfunction        Current Outpatient Medications   Medication Sig Dispense Refill   • amLODIPine 10 MG tablet Take 1 tablet (10 mg) by mouth daily. 90 tablet 2   • aspirin 81 MG EC tablet Take 81 mg by mouth daily.     • clopidogrel 75 MG tablet Take 1 tablet (75 mg) by mouth daily. For heart stent 30 tablet 11   • fexofenadine 180 MG tablet Take 1 tablet (180 mg) by mouth daily. For allergies (Patient not taking: Reported on 03/14/2020) 30 tablet 2   • ketoconazole 2 % shampoo Apply topically 2 times a week. Apply to scalp twice weekly. Let sit in scalp 5 minutes before rinsing out. 120 mL 2   • lisinopril 5 MG tablet Take 1 tablet (5 mg) by mouth daily. For heart and blood pressure 90 tablet 3   • metoprolol succinate ER 50 MG 24 hr tablet Take 1 tablet (  50 mg) by mouth daily. 90 tablet 2   • nitroGLYCERIN 0.4 MG SL tablet Place 1 tablet (0.4 mg) under the tongue every 5 minutes as needed for chest pain for up to 3 doses. If no relief, call 911. (Patient not taking: Reported on 03/14/2020) 25 tablet 6   • potassium citrate ER 10 MEQ (1080 MG) ER tablet Take 1 tablet (10 mEq) by mouth 3 times a day with meals. (Patient not taking: Reported on 03/14/2020) 90 tablet 5   • spironolactone 25 MG tablet Take 1 tablet (25 mg) by mouth daily. (Patient not taking: Reported on 03/14/2020) 90 tablet 3     No current facility-administered medications for this visit.        Review of patient's allergies indicates:  Allergies   Allergen Reactions   • Hydrochlorothiazide Skin: Itching and Throat Swelling   • Lidocaine Other     Hallucinations   • Penicillins Skin: Hives     Reaction in [place] (??)  Has patient had a PCN  reaction causing immediate rash, facial/tongue/throat swelling, SOB or lightheadedness with hypotension: Yes  Has patient had a PCN reaction causing severe rash involving mucus membranes or skin necrosis: No  Has patient had a PCN reaction that required hospitalization: No  Has patient had a PCN reaction occurring within the last 10 years: No  If all of the above answers are "NO", then may proceed with Cephalosporin use.     • Coconut Fatty Acids      Other reaction(s): Other (See Comments)  Reaction??   • Latex Skin: Itching   • Losartan Headache and Other     Other reaction(s): Other (See Comments), Unknown  Pt doesn't remember.  High blood pressure   • Statins Headache and Other     Other reaction(s): Other (See Comments), Other (See Comments) high blood pressure       Geriatric ROS: per MS4 note 03/17/20  "ADLs: independent  IADLS: independent  Cognition: no acute concerns  Mood: not assessed  Mobility: no acute concerns  Vision: not assessed  Hearing:  not assessed  Continence: not assessed, no issues previously"  CFS-9: 3-4    Social History:  Social History     Tobacco Use   • Smoking status: Never Smoker   • Smokeless tobacco: Never Used   Substance Use Topics   • Alcohol use: Not Currently   • Drug use: Not Currently   DPOA/Legal Next of Kin/main contact person/main contact #s: daughter Wendy [name]    Immunization History   Administered Date(s) Administered   • COVID-19 Moderna mRNA LNP-S 07/03/2019, 08/11/2019, 04/14/2020   • Influenza quadrivalent MDCK PF (Flucelvax) 04/18/2019   • Influenza quadrivalent PF 07/11/2017   • Influenza quadrivalent adjuvanted (Fluad) 03/17/2020   • Influenza trivalent high-dose 03/04/2018   • Pneumococcal conjugate PCV13 (Prevnar 13) 05/21/2014   • Pneumococcal polysaccharide PPSV23 (Pneumovax 23) 11/14/2012   • Tdap 11/14/2012           PHYSICAL EXAMINATION:  BP (!) 142/82    Pulse 80    Temp 36.7 °C (Temporal)    Resp 16    Wt (!) 101.5 kg (223 lb 11.2 oz)    SpO2 96%    BMI  42.27 kg/m²    Wt Readings from Last 5 Encounters:   06/23/20 (!) 101.5 kg (223 lb 11.2 oz)   03/17/20 98.4 kg (217 lb)   03/03/20 99 kg (218 lb 3.2 oz)   12/10/19 97.1   kg (214 lb)   11/18/19 96.2 kg (212 lb)       GENERAL: pleasant older female. Appearance: well-groomed, stated age, more confused than usual. Frequently touches top of head.  HEENT: EYES: Conjunctivae and lids are normal. Pupils: Pupils equal, round and reactive to light. Ears: L auditory canal small amount of cerumen. Difficult to fully visualize L tympanic membrane with available otoscope speculum. R auditory canal impacted cerumen.  CV: regular rate and rhythm, normal S1, S2, 4/6 systolic murmur, no IWLN/L8X2  LUNGS: unlabored breathing, clear to auscultation bilaterally  Neurologic: alert, repeats self at times. Some difficulty in following commands. CN II-XII grossly intact. 5/5 hand grip, elbow flexion and extension, hip flexion, knee flexion and extension bilaterally. Sensation to light touch grossly intact. 2+ biceps DTRs bilaterally. Normal rapid alternating hand movements. Some difficulty with heel-to-shin which may be related to osteoarthritis. Slowed finger-to-nose without definite dysmetria.  Gait: mildly unsteady without definite ataxia     Labs/Other Results:  Orders Only on 05/06/2020   Component Date Value    Sodium 05/06/2020 138     Potassium 05/06/2020 4.4     Chloride 05/06/2020 104     Carbon Dioxide, Total 05/06/2020 26     Anion Gap 05/06/2020 8     Glucose 05/06/2020 106     Urea Nitrogen 05/06/2020 21     Creatinine 05/06/2020 0.97     Calcium 05/06/2020 9.2     eGFR, Calculated 05/06/2020 58*    GFR, Information 05/06/2020 Calculated GFR by CKD-EPI equation. Inaccurate with changing renal function. See [email or web address].             ASSESSMENT/PLAN:  (R41.0) Acute confusion  (primary encounter diagnosis)  (R51.9) Acute nonintractable headache, unspecified headache  type  (R09.81) Sinus congestion  (H92.02) Otalgia of left ear  (K21.9) Gastroesophageal reflux disease without esophagitis     1. (R41.0) Acute confusion  2. (R51.9) Acute nonintractable headache, unspecified headache type: reports severe, worse with coughing  - Concern for CVA/TIA yesterday 1/12 with reported acute onset episode dizziness and slurred speech, evaluated by EMS yesterday. Exam today non-focal.  - Sent to [facility] ED, report given to triage RN. Clinic RN will escort Ms. Jamerson and daughter to ED.    3. (R09.81) Sinus congestion: past couple days, possible has sinusitis but will defer due to concern for CVA/TIA as above. Will also be evaluated for COVID-19.    4. (H92.02) Otalgia of left ear: unable to fully visualize L tympanic membrane in clinic since only larger otoscope speculums available. Hopefully this can be re-examined.    5. (K21.9) Gastroesophageal reflux disease without esophagitis: uncertain if insurance will cover hospital bed. Will defer this until after evaluation above.    Relevant Health Care Maintenance:  - Labs: glucose 106 05/06/20; lipids 07/16/19 re-check will be due next few months; TSH normal 06/23/19; 25-OH vitamin D normal 07/16/19; vitamin B-12 normal 03/04/18 ([facility])  - Supplements: confirm at future appointment   - Immunizations: discuss recombinant zoster vaccine in future defer today due to acute concerns  - Osteoporosis screening (DEXA): indicated N/A known osteoporosis. Last DEXA 07/16/19 next due ~07/2021.  - Abdominal aortic aneurysm screening: indicated NO (USPSTF guideline female never smoked)  - Colon cancer screening: indicated YES deferred discussion today due to acute concerns  - Mammogram/breast cancer screening: indicated YES deferred discussion today due to acute concerns    Advance care planning/POLST: LNOK daughter Wendy [name]    F/U TBD.    ADDENDUM: CTA head  and neck shows "Large area of loss of gray-white differentiation within the right temporoparietal region  suspicious for acute infarct concerning for acute infarct. 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    COVID-19 PCR not detected.

## 2020-06-23 ENCOUNTER — Ambulatory Visit (HOSPITAL_BASED_OUTPATIENT_CLINIC_OR_DEPARTMENT_OTHER): Payer: Medicare HMO | Admitting: Internal Medicine

## 2020-06-23 ENCOUNTER — Emergency Department (EMERGENCY_DEPARTMENT_HOSPITAL): Payer: Medicare HMO

## 2020-06-23 ENCOUNTER — Observation Stay (HOSPITAL_COMMUNITY): Payer: Self-pay | Admitting: Neurology

## 2020-06-23 ENCOUNTER — Encounter (HOSPITAL_BASED_OUTPATIENT_CLINIC_OR_DEPARTMENT_OTHER): Payer: Self-pay | Admitting: Internal Medicine

## 2020-06-23 ENCOUNTER — Observation Stay
Admission: EM | Admit: 2020-06-23 | Discharge: 2020-06-28 | Disposition: A | Discharge status: Home / Self Care | Payer: Medicare HMO | Source: Ambulatory Visit | Attending: Emergency Medicine | Admitting: Emergency Medicine

## 2020-06-23 ENCOUNTER — Inpatient Hospital Stay (HOSPITAL_COMMUNITY): Payer: Medicare HMO

## 2020-06-23 ENCOUNTER — Other Ambulatory Visit: Payer: Self-pay

## 2020-06-23 VITALS — BP 142/82 | HR 80 | Temp 98.1°F | Resp 16 | Wt 223.7 lb

## 2020-06-23 DIAGNOSIS — I2109 ST elevation (STEMI) myocardial infarction involving other coronary artery of anterior wall: Secondary | ICD-10-CM

## 2020-06-23 DIAGNOSIS — I63311 Cerebral infarction due to thrombosis of right middle cerebral artery: Secondary | ICD-10-CM | POA: Insufficient documentation

## 2020-06-23 DIAGNOSIS — I25709 Atherosclerosis of coronary artery bypass graft(s), unspecified, with unspecified angina pectoris: Secondary | ICD-10-CM

## 2020-06-23 DIAGNOSIS — R059 Cough, unspecified: Secondary | ICD-10-CM

## 2020-06-23 DIAGNOSIS — I639 Cerebral infarction, unspecified: Secondary | ICD-10-CM | POA: Insufficient documentation

## 2020-06-23 DIAGNOSIS — I2510 Atherosclerotic heart disease of native coronary artery without angina pectoris: Secondary | ICD-10-CM

## 2020-06-23 DIAGNOSIS — R69 Illness, unspecified: Secondary | ICD-10-CM

## 2020-06-23 DIAGNOSIS — R7309 Other abnormal glucose: Secondary | ICD-10-CM | POA: Insufficient documentation

## 2020-06-23 DIAGNOSIS — H9202 Otalgia, left ear: Secondary | ICD-10-CM | POA: Insufficient documentation

## 2020-06-23 DIAGNOSIS — R0981 Nasal congestion: Secondary | ICD-10-CM

## 2020-06-23 DIAGNOSIS — K219 Gastro-esophageal reflux disease without esophagitis: Secondary | ICD-10-CM | POA: Insufficient documentation

## 2020-06-23 DIAGNOSIS — R9431 Abnormal electrocardiogram [ECG] [EKG]: Secondary | ICD-10-CM | POA: Insufficient documentation

## 2020-06-23 DIAGNOSIS — R410 Disorientation, unspecified: Secondary | ICD-10-CM | POA: Insufficient documentation

## 2020-06-23 DIAGNOSIS — I440 Atrioventricular block, first degree: Secondary | ICD-10-CM | POA: Insufficient documentation

## 2020-06-23 DIAGNOSIS — R519 Headache, unspecified: Secondary | ICD-10-CM | POA: Insufficient documentation

## 2020-06-23 DIAGNOSIS — R569 Unspecified convulsions: Secondary | ICD-10-CM | POA: Insufficient documentation

## 2020-06-23 DIAGNOSIS — G9389 Other specified disorders of brain: Secondary | ICD-10-CM | POA: Insufficient documentation

## 2020-06-23 DIAGNOSIS — Z20822 Contact with and (suspected) exposure to covid-19: Secondary | ICD-10-CM | POA: Insufficient documentation

## 2020-06-23 DIAGNOSIS — I63511 Cerebral infarction due to unspecified occlusion or stenosis of right middle cerebral artery: Principal | ICD-10-CM | POA: Insufficient documentation

## 2020-06-23 DIAGNOSIS — R001 Bradycardia, unspecified: Secondary | ICD-10-CM

## 2020-06-23 DIAGNOSIS — I6789 Other cerebrovascular disease: Secondary | ICD-10-CM | POA: Insufficient documentation

## 2020-06-23 LAB — COMPREHENSIVE METABOLIC PANEL
ALT (GPT): 21 U/L (ref 7–33)
AST (GOT): 15 U/L (ref 9–38)
Albumin: 4.4 g/dL (ref 3.5–5.2)
Alkaline Phosphatase (Total): 65 U/L (ref 38–172)
Anion Gap: 8 (ref 4–12)
Bilirubin (Total): 0.8 mg/dL (ref 0.2–1.3)
Calcium: 9.3 mg/dL (ref 8.9–10.2)
Carbon Dioxide, Total: 27 meq/L (ref 22–32)
Chloride: 101 meq/L (ref 98–108)
Creatinine: 1.07 mg/dL — ABNORMAL HIGH (ref 0.38–1.02)
Glucose: 88 mg/dL (ref 62–125)
Potassium: 3.7 meq/L (ref 3.6–5.2)
Protein (Total): 7.3 g/dL (ref 6.0–8.2)
Sodium: 136 meq/L (ref 135–145)
Urea Nitrogen: 14 mg/dL (ref 8–21)
eGFR by CKD-EPI: 51 mL/min/{1.73_m2} — ABNORMAL LOW (ref >59)

## 2020-06-23 LAB — LAB ADD ON ORDER

## 2020-06-23 LAB — CBC, DIFF
% Basophils: 1 %
% Eosinophils: 1 %
% Immature Granulocytes: 1 %
% Lymphocytes: 17 %
% Monocytes: 9 %
% Neutrophils: 71 %
% Nucleated RBC: 0 %
Absolute Eosinophil Count: 0.08 10*3/uL (ref 0.00–0.50)
Absolute Lymphocyte Count: 0.99 10*3/uL — ABNORMAL LOW (ref 1.00–4.80)
Basophils: 0.03 10*3/uL (ref 0.00–0.20)
Hematocrit: 47 % — ABNORMAL HIGH (ref 36.0–45.0)
Hemoglobin: 15.5 g/dL (ref 11.5–15.5)
Immature Granulocytes: 0.03 10*3/uL (ref 0.00–0.05)
MCH: 30.9 pg (ref 27.3–33.6)
MCHC: 32.9 g/dL (ref 32.2–36.5)
MCV: 94 fL (ref 81–98)
Monocytes: 0.56 10*3/uL (ref 0.00–0.80)
Neutrophils: 4.24 10*3/uL (ref 1.80–7.00)
Nucleated RBC: 0 10*3/uL
Platelet Count: 213 10*3/uL (ref 150–400)
RBC: 5.02 10*6/uL — ABNORMAL HIGH (ref 3.80–5.00)
RDW-CV: 13.2 % (ref 11.6–14.4)
WBC: 5.93 10*3/uL (ref 4.3–10.0)

## 2020-06-23 LAB — URINALYSIS WITH REFLEX CULTURE
Bacteria, URN: NONE SEEN
Bilirubin (Qual), URN: NEGATIVE
Epith Cells_Renal/Trans,URN: NEGATIVE /HPF
Glucose Qual, URN: NEGATIVE mg/dL
Ketones, URN: NEGATIVE mg/dL
Leukocyte Esterase, URN: POSITIVE — AB
Nitrite, URN: NEGATIVE
Protein (Alb Semiquant), URN: NEGATIVE mg/dL
Specific Gravity, URN: >1.04 g/mL — ABNORMAL HIGH (ref 1.006–1.027)
pH, URN: 7 (ref 5.0–8.0)

## 2020-06-23 LAB — 1ST EXTRA BLUE TOP

## 2020-06-23 LAB — 1ST EXTRA PEARL TOP

## 2020-06-23 LAB — LIPID PANEL
Cholesterol (LDL): 203 mg/dL — ABNORMAL HIGH (ref <130)
Cholesterol/HDL Ratio: 6.2
HDL Cholesterol: 46 mg/dL (ref >39)
Non-HDL Cholesterol: 237 mg/dL — ABNORMAL HIGH (ref 0–159)
Total Cholesterol: 283 mg/dL — ABNORMAL HIGH (ref <200)
Triglyceride: 168 mg/dL — ABNORMAL HIGH (ref <150)

## 2020-06-23 LAB — B_TYPE NATRIURETIC PEPTIDE: B_Type Natriuretic Peptide: 174 pg/mL — ABNORMAL HIGH (ref <101)

## 2020-06-23 LAB — SARS-COV-2 (COVID-19) QUALITATIVE RAPID PCR: COVID-19 Coronavirus Qual PCR Result: NOT DETECTED

## 2020-06-23 LAB — REFLEX CULTURE FOR UA

## 2020-06-23 LAB — MAGNESIUM: Magnesium: 2 mg/dL (ref 1.8–2.4)

## 2020-06-23 LAB — L LACTATE, VENOUS WB (TO U/H LAB WITHIN 30 MIN): L Lactate (Direct), Venous Whole Blood: 1.2 mmol/L (ref 0.6–1.9)

## 2020-06-23 MED ORDER — SODIUM CHLORIDE (PF) 0.9 % IJ SOLN
1.0000 mL | Freq: Once | INTRAVENOUS | Status: AC | PRN
Start: 2020-06-23 — End: 2020-06-26

## 2020-06-23 MED ORDER — IOHEXOL 350 MG/ML IV SOLN
100.0000 mL | Freq: Once | INTRAVENOUS | Status: AC | PRN
Start: 2020-06-23 — End: 2020-06-23
  Administered 2020-06-23: 100 mL via INTRAVENOUS

## 2020-06-23 MED ORDER — METOPROLOL SUCCINATE ER 50 MG OR TB24
50.0000 mg | EXTENDED_RELEASE_TABLET | Freq: Once | ORAL | Status: DC
Start: 2020-06-23 — End: 2020-06-23
  Filled 2020-06-23: qty 1

## 2020-06-23 MED ORDER — DIPHENHYDRAMINE HCL 50 MG/ML IJ SOLN
12.5000 mg | Freq: Once | INTRAMUSCULAR | Status: AC
Start: 2020-06-23 — End: 2020-06-23
  Administered 2020-06-23: 12.5 mg via INTRAVENOUS
  Filled 2020-06-23: qty 1

## 2020-06-23 MED ORDER — ACETAMINOPHEN 500 MG OR TABS
1000.0000 mg | ORAL_TABLET | Freq: Once | ORAL | Status: AC
Start: 2020-06-23 — End: 2020-06-23
  Administered 2020-06-23: 1000 mg via ORAL
  Filled 2020-06-23: qty 2

## 2020-06-23 MED ORDER — AMLODIPINE BESYLATE 5 MG OR TABS
10.0000 mg | ORAL_TABLET | Freq: Once | ORAL | Status: DC
Start: 2020-06-23 — End: 2020-06-23

## 2020-06-23 MED ORDER — LISINOPRIL 5 MG OR TABS
5.0000 mg | ORAL_TABLET | Freq: Once | ORAL | Status: DC
Start: 2020-06-23 — End: 2020-06-23

## 2020-06-23 MED ORDER — METOCLOPRAMIDE HCL 5 MG/ML IJ SOLN
5.0000 mg | Freq: Once | INTRAMUSCULAR | Status: AC
Start: 2020-06-23 — End: 2020-06-23
  Administered 2020-06-23: 5 mg via INTRAVENOUS
  Filled 2020-06-23: qty 2

## 2020-06-23 MED ORDER — ASPIRIN 81 MG OR CHEW
324.0000 mg | CHEWABLE_TABLET | Freq: Once | ORAL | Status: AC
Start: 2020-06-24 — End: 2020-06-24
  Administered 2020-06-24: 324 mg via ORAL
  Filled 2020-06-23: qty 4

## 2020-06-23 MED ORDER — LABETALOL HCL 5 MG/ML IV SOLN
10.0000 mg | INTRAVENOUS | Status: DC | PRN
Start: 2020-06-23 — End: 2020-06-28

## 2020-06-23 NOTE — ED Triage Notes (Signed)
[facility], c/o headache, sinus congestion. Left earache. Left kidney pain, dizzyness.

## 2020-06-23 NOTE — ED Notes (Signed)
Patient presents to ED with complaints of fullness and aching in both ears, intermittent headache relieved by rubbing the top of her head, and "kidney pain". Patient states her left kidney has an "injury due to an old kidney stone". Patient in no acute distress, neuro intact.      Ardis Hughs, RN  06/23/20 1120
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
## 2020-06-24 ENCOUNTER — Other Ambulatory Visit: Payer: Self-pay

## 2020-06-24 DIAGNOSIS — I10 Essential (primary) hypertension: Secondary | ICD-10-CM

## 2020-06-24 LAB — EKG 12 LEAD
Atrial Rate: 60 {beats}/min
P Axis: 59 degrees
P-R Interval: 216 ms
Q-T Interval: 424 ms
QRS Duration: 84 ms
QTC Calculation: 424 ms
R Axis: 68 degrees
T Axis: 105 degrees
Ventricular Rate: 60 {beats}/min

## 2020-06-24 LAB — URINALYSIS WITH REFLEX CULTURE
Bilirubin (Qual), URN: NEGATIVE
Epith Cells_Renal/Trans,URN: NEGATIVE /HPF
Glucose Qual, URN: NEGATIVE mg/dL
Leukocyte Esterase, URN: POSITIVE — AB
Nitrite, URN: NEGATIVE
Protein (Alb Semiquant), URN: NEGATIVE mg/dL
Specific Gravity, URN: 1.029 g/mL — ABNORMAL HIGH (ref 1.006–1.027)
pH, URN: 6.5 (ref 5.0–8.0)

## 2020-06-24 LAB — REFLEX CULTURE FOR UA

## 2020-06-24 LAB — HEMOGLOBIN A1C, HPLC: Hemoglobin A1C: 5.7 % (ref 4.0–6.0)

## 2020-06-24 MED ORDER — METOPROLOL SUCCINATE ER 50 MG OR TB24
50.0000 mg | EXTENDED_RELEASE_TABLET | Freq: Every day | ORAL | Status: DC
Start: 2020-06-24 — End: 2020-06-26
  Administered 2020-06-24 – 2020-06-26 (×3): 50 mg via ORAL
  Filled 2020-06-24 (×3): qty 1

## 2020-06-24 MED ORDER — ENOXAPARIN SODIUM 40 MG/0.4ML IJ SOSY
40.0000 mg | PREFILLED_SYRINGE | Freq: Two times a day (BID) | INTRAMUSCULAR | Status: DC
Start: 2020-06-24 — End: 2020-06-28
  Administered 2020-06-24 – 2020-06-28 (×9): 40 mg via SUBCUTANEOUS
  Filled 2020-06-24 (×9): qty 0.4

## 2020-06-24 MED ORDER — HEPARIN SODIUM (PORCINE) 5000 UNIT/ML IJ SOLN
5000.0000 [IU] | Freq: Three times a day (TID) | INTRAMUSCULAR | Status: DC
Start: 2020-06-24 — End: 2020-06-24
  Administered 2020-06-24: 5000 [IU] via SUBCUTANEOUS
  Filled 2020-06-24: qty 1

## 2020-06-24 MED ORDER — ASPIRIN 325 MG OR TABS
325.0000 mg | ORAL_TABLET | Freq: Every day | ORAL | Status: DC
Start: 2020-06-24 — End: 2020-06-24

## 2020-06-24 MED ORDER — CEFTRIAXONE SODIUM IN DEXTROSE 20 MG/ML IV SOLN
1.0000 g | INTRAVENOUS | Status: DC
Start: 2020-06-24 — End: 2020-06-25
  Administered 2020-06-24 – 2020-06-25 (×2): 1 g via INTRAVENOUS
  Filled 2020-06-24 (×2): qty 50

## 2020-06-24 MED ORDER — ASPIRIN 81 MG OR TBEC
81.0000 mg | DELAYED_RELEASE_TABLET | Freq: Every day | ORAL | Status: DC
Start: 2020-06-25 — End: 2020-06-28
  Administered 2020-06-25 – 2020-06-28 (×4): 81 mg via ORAL
  Filled 2020-06-24 (×4): qty 1

## 2020-06-24 MED ORDER — ASPIRIN 325 MG OR TABS
325.0000 mg | ORAL_TABLET | Freq: Every day | ORAL | Status: DC
Start: 2020-06-25 — End: 2020-06-24

## 2020-06-24 MED ORDER — ATORVASTATIN CALCIUM 80 MG OR TABS
80.0000 mg | ORAL_TABLET | Freq: Every day | ORAL | Status: DC
Start: 2020-06-24 — End: 2020-06-28
  Administered 2020-06-24 – 2020-06-28 (×5): 80 mg via ORAL
  Filled 2020-06-24 (×5): qty 1

## 2020-06-24 NOTE — Progress Notes (Signed)
Physical Therapy     06/24/20 1154   PT Last Visit   PT Received On 06/24/20   PT Diagnosis Pt admitted 1/13 w/R parietal CVA   General   Documentation Type Initial Eval   Treatment Start Time 1154   Treatment End Time 1220   Treatment Duration (min) 26 Mins   Family/Caregiver Present No   Interpreter Services   Is an interpreter used? No   Home Living   Type of Home Apartment   Entry Stairs 0   Lives With Alone   Prior Function   Prior Level of Function Independent with all functional mobility and ADLs   Details on Prior Level of Function Pt was very active. Does Tai Chi regularly   Cognition   Overall Cognitive Status Impaired   Orientation Level Oriented X4   Following Commands Follows multistep commands with repetition   Safety Judgment Decreased awareness of need for safety   Awareness of Errors Decreased awareness of errors   Deficits Not aware of deficits   Attention Span Difficulty attending to directions   Executive Functions Decreased insight into deficits;Decreased awareness of errors;Decreased self monitoring;Difficulty grasping main idea;Decreased attention to detail   Cognition Comments Perseverative and difficulty attending to things outside of her own focus. Pleasant and cooperative overall   RUE Assessment   RUE Assessment WFL   LUE Assessment   LUE Assessment WFL   RLE Assessment   RLE Assessment Comments 5/5 hip flexion, knee ext, ankle DF. ROM WFL   LLE Assessment   LLE Assessment Comments 5/5 hip flexion, knee ext, ankle DF. ROM WFL   Sensation   Sensation Comments Denies changes   Perception   Inattention/Neglect Cues to attend left visual field;Cues to attend to left side of body   Perseveration Perseverates during conversation   Static Sitting Balance   Static Sitting Level of Assistance Independent   Dynamic Sitting Balance   Dynamic Sitting Balance Level of Assist Independent   Static Standing Balance   Static Standing Balance Level of Assistance Independent   Static Standing Balance  Comment Independent narrow BOS, CGA with eyes closed   Dynamic Standing Balance   Dynamic Standing Balance Level of Assist Independent   Dynamic Standing-Comments Independent side stepping, turning and backing, head turns. No LOB with speed changes, but pt does not change speed signficantly. CGA tandem walking.    Activity Tolerance   Endurance Endurance does not limit participation in activity   Bed Mobility   Supine>Sit Independent   Sit>Supine Independent   Transfers   Sit<>Stand Independent   Ambulation   Distance >526f with steady gait, no loss of balance. Occasional drifting, but avoids obstacles independently. Decreased R step length, R pelvis trunk rotated posteriorly.   Assistance Needs Independent   Assistive Device Used None   Stairs   Number of steps 4   Style Step to   Assistance Needs Supervision / Stand by assistance   Assistive Device Used 1 railing   PT Assessment   PT Assessment Results Decreased safety awareness;Impaired balance   Impact on Function and Participation Patient with mild balance deficits on testing, but functionally performs well without loss of balance. Recommend assist with community  mobility as patient's perceptual and attention deficits in combination with her decreased balance and safety awareness. Recommend assist in the community and outpatient/home health physical therapy   Evaluation/Treatment Tolerance Patient tolerated treatment well       PT Daily Recommendations Ambulate supervision in the hallway  Plan   PT Plan No skilled PT/Discharge PT

## 2020-06-24 NOTE — Progress Notes (Signed)
Telemetry Progress Note    Cardiac Rhythm Summary: Day shift and Abnormal PR: 0.21    Rhythm: Sinus rhythm (SR), Sinus tachycardia (ST), Sinus bradycardia (SB) and Sinus arrhythmia    Rate exact: Low: 48 To High: 106

## 2020-06-24 NOTE — Nursing Note (Signed)
Patient Summary  75yo woman with hx AS, CAD s/p CABG, mild cognitive impairment, presenting with one day of confusion and possible aphasia. Found to have had an ischemic stroke on MRI.    A&Ox3 confused to time. Very poor attention and concentration. PERRL. No facial droop. Tele 1st AVB. HR drops into the 50's while sleeping, provider notified. New parameter order received. FC, MAE 5/5. Ambulates with SBA. Continent of B&B. Continue on IV ABX for GU infection. PT,OT,SPEECH work with her today.Waiting for dc plan.      Illness Severity  stable

## 2020-06-24 NOTE — Discharge Summary (Signed)
Discharge Summary     Thai Hemrick [facility]") - DOB: 09-02-1945 (75 year old female)  Gender Identity: Female  Preferred Pronouns: she/her/hers  PCP: Debroah Loop, MD   Code Status: Full Code        DATE OF ADMISSION: 06/23/2020  DATE OF DISCHARGE: 06/28/20  DISCHARGE TEAM & ATTENDING: Neurology Sumi & Raylene Everts, MD     ADMISSION DIAGNOSIS: R MCA ischemic stroke  DISCHARGE DIAGNOSIS: R MCA Ischemic stroke    PROBLEMS ADDRESSED DURING THIS HOSPITALIZATION:   Principal Problem:    Ischemic stroke [facility])  Active Problems:    Coronary artery disease  Resolved Problems:    Seizure ([place])    Bradycardia      DISCHARGE FOLLOW-UP VISITS/APPOINTMENTS:    Upcoming appointments at [facility] Medicine:  Future Appointments   Date Time Provider [place]   06/28/2020  1:30 PM [facility] ECG PROCEDURES LAB H EKG [place] RADIOLOG   07/21/2020  8:10 AM Debroah Loop, MD [facility]        Additional follow-up:  [facility]  [place]  [phone]  Follow up on 07/21/2020  This is your post hospitalization follow up appointment with your Primary Care Provider Dr Ronalee Red. Please be available by phone at [place], the appointment is at 8:10am    [facility]  [address]  [place]  [phone]  Follow up in 3 week(s)  F/U in 3 weeks with NP and in 3 months with attending    [facility] at [facility]  [address]  [place] [address]  [phone]  Follow up  please call this number to see if this test (event monitor) can be mailed to you    [facility]  [place]  [address]  [phone]  Follow up  Next available new patient appt. post discharge s/p R MCA stroke needs follow-up for mild to moderate aortic stenosis with regurgitation found on TTE.    PENDING RESULTS THAT REQUIRE FOLLOW-UP (as of this summary):  Pending Labs     Order Current Status    1st Extra Urine Yellow Top In process     COVID-19 Coronavirus Qualitative PCR Preliminary result          DIAGNOSTIC STUDIES RECOMMENDED:  - Cardiac event monitor    INCIDENTAL FINDINGS THAT REQUIRE FOLLOW-UP:   None      THERAPEUTIC RECOMMENDATIONS:  # R MCA (parietal) stroke:  Etiology unclear at this time. Suspect cardioembolic although workup thus far has been negative. Also considered atherosclerotic disease given calcified disease both intra and extracranially.   HbA1c 5.7%, fasting lipid panel T 283, TG 168, HDL 46, LDL 203, BNP 174  - Continue Aspirin 24m daily  - Continue plavix 766mdaily  (patient on DAPT prior to admission for cardiac disease)  - Continue Atorvastatin 8041maily  - Cardiac monitor as outpatient  - Goal normotension, restart home medications (lisinopril, amlodipine)  - Follow up in stroke clinic in 2-4 weeks    #History ofCABG  #Coronary artery disease  - Continue metoprolol  - Continue DAP (asirin and plavix)T and atrovastatin as above    #Hypertension  - Restart home meds    #UTI  Leuk esterase +, nitrite -. Patient not having dysuria or other UTI sx, therefore CTX stopped on 1/15.    Physical Therapy:  Ambulate supervision in the hallway  No outpatient  PT needed    Occupational Therapy:  Outpatient occupational therapy     Speech Language Pathology:  Outpatient SLP  Cognitive Communication Recommendations  Minimize distractions;If the patient strays from the topic or task at hand, gently re-direct;Provide step wise verbal cues for complex tasks;Encourage patient to write down information they need to remember;Reduce external stimuli (TV, smart phone, electronic devices, number of visitors);Follow consistent routine as able   Communication Recommendations Keep verbal input short, simple, direct       ALLERGIES:  Hydrochlorothiazide, Lidocaine, Penicillins, Coconut fatty acids, Latex, Losartan, and Statins      DISCHARGE MEDICATIONS:   Current Discharge Medication List      START taking these medications    Details    atorvastatin 80 MG tablet Take 1 tablet (80 mg) by mouth daily.  Qty: 90 tablet, Refills: 0         CONTINUE these medications which have CHANGED    Details   clopidogrel 75 MG tablet Take 1 tablet (75 mg) by mouth daily.  Qty: 30 tablet, Refills: 0      metoprolol succinate ER 25 MG 24 hr tablet Take 0.5 tablets (12.5 mg) by mouth daily. Do not chew or crush.  Qty: 15 tablet, Refills: 0         CONTINUE these medications which have NOT CHANGED    Details   amLODIPine 10 MG tablet Take 1 tablet (10 mg) by mouth daily.  Qty: 90 tablet, Refills: 2    Associated Diagnoses: Essential hypertension      aspirin 81 MG EC tablet Take 81 mg by mouth daily.      ketoconazole 2 % shampoo Apply topically 2 times a week. Apply to scalp twice weekly. Let sit in scalp 5 minutes before rinsing out.  Qty: 120 mL, Refills: 2    Associated Diagnoses: Seborrheic dermatitis      lisinopril 5 MG tablet Take 1 tablet (5 mg) by mouth daily. For heart and blood pressure  Qty: 90 tablet, Refills: 3    Associated Diagnoses: Coronary artery disease involving native coronary artery of native heart without angina pectoris         STOP taking these medications       fexofenadine 180 MG tablet Comments:   Reason for Stopping:         nitroGLYCERIN 0.4 MG SL tablet Comments:   Reason for Stopping:         potassium citrate ER 10 MEQ (1080 MG) ER tablet Comments:   Reason for Stopping:         spironolactone 25 MG tablet Comments:   Reason for Stopping:               BRIEF ADMISSION HISTORY:   Taken from admit note by Dr. Lurline Hare dated 06/23/20  "For both patient and daughter, at baseline patient is independent and lives at a senior housing in [place].  She is active, able to drive herself to get groceries and does daily tai chi exercises.  She was in her usual state of health until yesterday 06/22/2020, when patient was in [place] around [place] trying to buy a home COVID-19 test.  The order is unclear, but it appears that patient endorsed  difficulty articulating speech, where the stroke alert was able to understand her.  Around the same time, she endorsed some dizziness and gait instability.  Given these issues, the store workers offered to call 911, but she sought to call her daughter instead.  Daughter stated she got a call around 10 AM, at which she feels she may embolism possible subtle signs of speech changes, and thus drove to take her home.  During this time, patient endorsed significant headache that she describes as "sinus" headache.  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   Patient states that her normal visual aura involves a halo in her entire visual field, although most recent episode involved visual symptoms on the left side.  Ultimately, the firefighters were brought to the store, who after quick evaluation (noted that BP was elevated to systolic 355D), felt that based on their evaluation, presentation to the hospital was not strongly indicated, so the patient and the daughter deferred it at this point.    The following day 07/10/2020, patient presented to her PCP Dr. Ether Griffins, who after hearing the story felt concern for possible TIA/CVA, and instructed her to present to the ED.  Initially in the ED, patient was afebrile and hemodynamically stable (SBP 140s), with lab work-up notable for urinalysis suggestive of UTI.  CTA head and neck was obtained, which was notable for new hypodensity on the right temporoparietal region, which was compared to prior imaging from 04/18/2019 (which was obtained as a work-up for presentation of dizziness at the time, which returned negative).    Upon interview and discussion with patient, patient generally endorses and corroborates the story above.  The patient daughter at bedside states that she feels her mom is nearing her baseline, although not fully there."    HOSPITAL COURSE:   Patient was admitted to the  neurology services for concern for stroke after CT head in the ED revealed now hypodensity. This was confirmed on MRI done overnight after admission. Workup  Was revealing for calcified vessel disease intracranially. Etiology unclear but cardioembolic vs. atherosclerotic narrowing, although we favor the former. Labs notable for elevated BNP but largely unremarkable telemetry. Patient also had elevated LDL was continued on ome atorvastatin. After interviewing patient, visual symptoms appeared more consistent with migraine with aura rather than seizure like symptoms. EEG and seizure workup were not perused. Patient was evaluated by therapies and felt safe for discharged with home OT and SLP for some cognitive dysfunction. Family amenable to supporting patient around the home.     DISPOSITION:    01 HOME/SELF CARE [01]Home    CONDITION: fair     CONSULTS COMPLETED:    IP CONSULT TO NUTRITION SERVICES     OPERATIONS/PROCEDURES:  No surgeries this admission    Additional procedures:  None     PRINCIPAL DIAGNOSTIC STUDIES/RESULTS:    TransTHORACIC echo (TTE) complete   Final Result by Candi Leash, MD (01/15 1803)      MRI Brain wo Contrast   Final Result by Margot Ables, MD (01/13 1859)   1.  Acute right MCA territory infarct. No evidence of hemorrhagic transformation.      This is a preliminary report dictated by Dr..Alcacoas(Fellow,ERad). Unless a final report appears below these images have not been reviewed by an attending radiologist.          ATTENDING FINAL REPORT      FINDINGS/IMPRESSION (final):   I agree with the reported preliminary findings and impression, as above described (Cat 1).       I have personally reviewed the images and agree with the report (or as edited).      CTA Head  and Neck Angio   Final Result by Justice Deeds, MD (01/13 1450)   1. CTA head: Large area of loss of gray-white differentiation within the right temporoparietal region suspicious for acute infarct concerning for  acute infarct. No evidence of hemorrhagic conversion.   2. CTA head: Multiple punctate calcified lesions noted through the left MCA artery, most notable within the right M2 branch with decreased density of contrast filling distally near the region of infarct, likely reflecting nonocclusive calcified thrombus. Additional diffuse multifocal luminal irregularities involving the right greater than intracranial vessels, likely secondary to atherosclerotic disease   3. CTA neck: No high-grade stenosis, dissection or occlusion.      The above findings were communicated to Dr. Mina Marble by Dr. Trellis Moment at 06/23/2020 1:50 PM.      XR Chest 1 View   Final Result by Merlene Laughter, MD (01/13 1137)   Lines and tubes: None.      Lungs: Plate atelectasis in the right lung base. Otherwise lungs are clear.      Elevation of the right hemidiaphragm, as before.      Pleura: No effusion. No pneumothorax.      Heart and mediastinum: Probable cardiomegaly, unchanged.      Bones: No acute or suspicious abnormality.   Evidence of median sternotomy with sequela of prior CABG.      I have personally reviewed the images and agree with the report (or as edited).      Event Monitor (Up to 30 Days)    (Results Pending)       Discharge Orders   Referral to [place]   Standing Status: Future   Referral Priority: Routine Referral Type: Specialty Visit   Number of Visits Requested: 1     Referral to Home Health Services   Standing Status: Future   Referral Priority: Routine Referral Type: Specialty Visit   Number of Visits Requested: 1     Full weight bearing     Weight Bearing Status Full Weight Bearing      No driving     Event Monitor (Up to 30 Days)   Standing Status: Future Standing Exp. Date: 06/25/21     Is this a mail out monitor? Yes    Release Result to Patient Immediate      Regular diet     Diet type: Regular diet         Physical Exam  General: well appearing F, NAD, sitting up at side of bed  HEENT: mmm, no redness, swelling or  trauma over R scalp where pain is present  Card: warm and well perfused extremities  Pulm: breathing comfortably  Neuro: At times inattentive, easily distracted. Alert and able to converse fluently. Left quadrant field cut, lower>upper. Other CN symmetric. Moves all extremities briskly. Wide based gait.         [place] physicians mentioned in this note can be reached by calling [facility] at [phone]. If any part of this transcript is missing or to request other transcripts for this patient call [phone]. For online access to patient records enroll in [place] at [email or web address].

## 2020-06-24 NOTE — Nursing Note (Signed)
Patient Summary  75yo woman with hx AS, CAD s/p CABG, mild cognitive impairment, presenting with one day of confusion and possible aphasia.    A&Ox3 confused to time. Very poor attention and concentration. PERRL. No facial droop. Tele 1st AVB. FC, MAE 5/5. Ambulates with SBA. Continent of B&B. Started on IV ABX for GU infection.       Illness Severity  stable

## 2020-06-24 NOTE — H&P (Signed)
I saw, evaluated an discussed the patient with the [facility] Neurology team and agree with Drs. Sung and Cooper's overnight admission notes as well as their post-call follow up notes from this morning.   Date of Service: 06/24/2020    Diagnoses:  Acute ischemic stroke, right MCA territory    Ms Fielding is 37, RH and with several cerebrovascular risk factors including coronary artery disease, migraines and hypertension. She was confused and dizzy in a store, Medics saw her and weren't too concerned, so she went home. Went to her scheduled PCP appointment the next day (1/13) which prompted presentation to the hospital.   On examination this morning, she is pleasant, cooperative and quite talkative. She has a left visiual field cut. Normal strength bilaterally.    I personally reviewed the following pertinent studies:  CT head from yesterday with right parietal subcortical hypodensity  MRI brain with diffusion restriction right MCA territory  Due to PACS system being down, I have yet to review the CTA in detail that has been read as "multifocal luminal irregularaties involving right greater than left intracranial vessels, likely secondary to atherosclerotic disease".     Impression and Plan:  Possibly cardioembolic ischemic stroke - admitted on AIS protocol, will undergo Echo and we will review MRA once PACS is back up to consider vascular etiologies and determine antiplatelet therapy. ASA for now. Continue telemetry, appreciate involvement of rehab therapies.
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
## 2020-06-24 NOTE — Progress Notes (Signed)
Occupational Therapy Evaluation/Discharge     Patient Name: Wendy [name]  MRN: [id number]  Today's Date: 06/24/2020    General Visit Information  Documentation Type: Initial Eval  Treatment Start Time: 5621  Treatment End Time: 1138  Treatment Duration (min): 40 Minutes  Family/Caregiver Present: No  Billable Treatment Time: Eval highcomplex- 2u ADL         No diagnosis found. R MCA stroke    Therapy Precautions  None     Subjective  Patient Report/Self-Assessment: "i dont think i have any changes from the stroke"  Patient Stated Goal: get data on medication    Home Living  Type of Home: Apartment  Lives With: Alone  Bathroom Toilet: Standard    Prior Level of Function  BADLs: Independent  IADLs: Independent  Functional Mobility: Independent  Leisure and Hobbies: Watching TV, evaluating systemic racism   Responsibilities: Bill paying;Driving;Housework;Meal preparation;Shopping  Assistance Available at Home: Other (Comment) (Unclear- tp lives in senior housing, )    Pain  Patient tolerates session well  Mild headache    Vitals  No signs or symptoms with postural changes    Skin       Positioning/Orthoses  N/A    Extremity Assessment  RUE Assessment  RUE Assessment: Within Functional Limits                         LUE Assessment  LUE Assessment: Within Functional Limits                         Hand Function  Gross Grasp: Functional  Hand Function Comments: Perseverates on task but demonstrates appropriate bi82manual function    RLE Assessment  RLE Assessment: Within Functional Limits                LLE Assessment  LLE Assessment: Within Functional Limits                Neuro Assessment  Coordination  Movements are Fluid and Coordinated: Yes        Vision  Visual Screen Results: Visual field deficit  Ocular Fixations: Nystagmus (W intiial R side gaze from L to R)  Ocular Pursuits: Full ROM  Ocular Saccades: Undershooting (Especially L to R)  Vision Comments: Bells Test: 11/35 targets located. Disorganized search  pattern, focused on R sided w/o scanning, localized L side border of sheet did not cross over only jumping. Did not attempt to utilize strategies    Cognition  Overall Cognitive Status: Impaired  Arousal/Alertness: Appropriate responses to stimuli  Orientation Level: Oriented X4  Following Commands: Follows multistep commands with repetition  Safety Judgment: Decreased awareness of need for safety  Awareness of Errors: Decreased awareness of errors  Deficits: Not aware of deficits  Attention Span: Difficulty attending to directions  Memory: Decreased short term memory  Problem Solving: Assistance required to implement solutions  Executive Functions: Decreased insight into deficits;Decreased awareness of time;Disinhibition;Decreased mental flexibility;Decreased planning;Decreased self monitoring;Decreased organization;Difficulty grasping main idea;Decreased attention to detail  Cognition Comments: Significant functional cog deficits- perseverative, L side inattention       Self-Care/Home Management  Grooming: Independent  Toileting: Independent  Medication Management:Medbox Assessment    Patient read and interpreted 0 items   The patient is asked to complete the Medbox assessment which is a subtest of the Cognitive Performance Test (CPT). The patient is asked to read and correctly interpret 4 unfamiliar medication bottle labels and set  up a week of medications in two medisets, one for the morning and for the evening.       Patient completed the subtest as follows:  One tablet each morning (10 pills total):   One tablet as needed for pain, not to exceed 8 in 24 hours. (10 pills total):   Two pills every other morning, (start with Monday), take until gone. (8 pills total):   Two tablets twice daily for infection. (30 pills total):     Summary:  Pt scored  DEPENDENTwith setting up a mediset with unfamiliar medications.    Overall, pt demonstrated difficulties with:  attention, attention to details, complex problem  solving, organization, simple problem solving and task efficiency. Pt struggled w/ attention to recall directions, perseverating on single bottle w/ o ability to initiate performance. Direct cues provided 3 times to recall directions, initiate task.     Recommendations:  - Provide full supervision for medication management.          Bed Mobility  Supine>Sit: Independent  Sit>Supine: Independent  Scooting: Independent    Balance  Static Sitting Level of Assistance: Independent    Dynamic Sitting Balance Level of Assist: Independent    Static Standing Balance Level of Assistance: Independent    Dynamic Standing Balance Level of Assist: Independent    Transfers/Functional Mobility  Transfers  Sit<>Stand: Independent  Toilet Transfer: Independent  Functional Mobility  Distance: in room even surface Independent       Activity Tolerance   Good                Assessment  OT Assessment Results: Impaired ADL status;Impaired safety and/or judgement during ADL's;Impaired cognition;Visual deficit;Impaired IADL's  Acute OT evaluation and treatment for pt w/ new R MCA stroke. Pt presents with significant functional cognitive impairments and L sided inattention. Habitual task like BADLS performed without the need of physical assistance, but occasional cues required for specific sequencing or deviation from perseverative desires. Higher level function  For IADLS unsuccessful requiring direct assistance for sequencing simple problem solving. Pt has poor insight to impairments    Occupational performance: pt currently requires direct assistance for any complex non routine ADLS and total assistance for all IADLS- related to planning, organization, attention to detail- like new medication mgmt, meal preparation, making appointments and driving. Supervision for any new environment for mobility  Recommend continued OT at next level of care for vision impairments and functional cognition.     Prognosis: Good  Barriers to Discharge:  Decreased caregiver support  Evaluation/Treatment Tolerance: Patient tolerated treatment well    In House Therapy Plan  Treatment Interventions: Self-care/Home management     OT Plan: Social work consult          OT Recommendations  Follow up with OT at next level of care;Assistance with  Assistance with Details: ADLs;Home safety;IADLs           Eulah Citizen, [place]  06/24/2020

## 2020-06-24 NOTE — Consults (Signed)
Brief Nutrition Note    Received consult for stroke nutrition education; RD will provide education prior to discharge as appropriate.      ---    Nutrition Consult Line: [phone]

## 2020-06-24 NOTE — Progress Notes (Signed)
Progress Note     Wendy [name] [facility]") - DOB: [birth year] 75 year old female)  Gender Identity: Female  Preferred Pronouns: she/her/hers  Admit Date: 06/23/2020  Code Status: Full Code       CHIEF CONCERN / IDENTIFICATION:  Wendy [name] is a 75 year old right-handed F with history of CAD s/p CABG and migraine headaches with aura who presented to the ED after acute episode of confusion with dizziness 1/12, found to have R MCA ischemic stroke.     SUBJECTIVE   INTERVAL HISTORY:  -VSS, Bps 960A systolic  -admitted to acute care  -MRI confirmed R MCA infarct without hemorrhagic transformation  -persistent confusion, inattention. PT/OT working with her today to determine safe dispo    SCHEDULED MEDICATIONS:     [START ON 06/25/2020] aspirin, 325 mg, Daily    atorvastatin, 80 mg, Daily    cefTRIAXone, 1 g, q24h    enoxaparin, 40 mg, q12h [facility]    metoprolol succinate ER, 50 mg, Daily    INFUSED MEDICATIONS:       PRN MEDICATIONS:  labetalol, 10-60 mg, PRN    perflutren lipid microsphere (Definity) in NS injection, 1-10 mL, Once PRN       OBJECTIVE     Vitals (Most recent in last 24 hrs)     T: 36.8 C (06/24/20 0833)  BP: (!) 146/64 (06/24/20 0833)  HR: 68 (06/24/20 0833)  RR: 20 (06/24/20 0833)  SpO2: 97 % (06/24/20 0833) Room air  T range: Temp  Min: 36.7 C  Max: 36.8 C  Admit weight: (!) 101.5 kg (223 lb 12.3 oz) (06/23/20 2324)  Last weight: (!) 105 kg (231 lb 7.7 oz) (06/24/20 0024)       I&Os:     Intake/Output Summary (Last 24 hours) at 06/24/2020 1427  Last data filed at 06/24/2020 1000  Intake    Output 2 ml   Net -2 ml       Physical Exam  General: well appearing F, NAD  HEENT: mmm  Card: warm and well perfused extremities  Pulm: in no respiratory distress  Neuro: At times inattentive, easily distracted. Alert and able to converse fluently. Left upper quadrant field cut. Other CN symmetric. 5/5 strength in extremities.     Labs (last 24 hours):   Chemistries  CBC  LFT  Gases, other   - - - -    -   AST: - ALT: -  -/-/-/-   -/-/-/-   - - -   - >< -  AP: - T bili: -  Lact (a): - Lact (v): -   eGFR: - Ca: -   -   Prot: - Alb: -  Trop I: - D-dimer: -   Mg: - PO4: -  ANC: -     BNP: 174 Anti-Xa: -     ALC: -    INR: -      Imaging Results:  MRI Brain wo Contrast  Narrative: EXAMINATION:    MRI BRAIN W/O CONTRAST    CLINICAL INDICATION:  MRI brain stroke protocol    TECHNIQUE:  MRI Head without and with contrast: Routine (B 2)     Non-contrast:  Sagittal T1.  Axial T1, T2 FLAIR, DWI, T2*   Post-contrast:  Axial  T1,  coronal T1 fat sat  T2*,     CONTRAST:  None    COMPARISON:  Same day CT head and neck.    PRELIMINARY FINDINGS:    HEAD  MR:   MASS EFFECT & VENTRICLES: No shift. The lateral ventricles are symmetric. The ventricles, sulci and cisterns are normal.  BRAIN:  Mild cerebral volume loss seen.  Numerous numerous punctate FLAIR hyperintensities in the periventricular and subcortical white matter consistent with microvascular ischemic change.  Diffuse T2 and FLAIR hyperintensity is noted in the right temporal lobe with evidence of restricted diffusivity on DWI. No hypointensities encountered on SWI to suggest hemorrhagic transformation.    VASCULAR:  Cavernous carotid, vertebral, and other intracranial vascular flow voids are normal  EXTRA-AXIAL:  Extra-axial spaces are normal.   EXTRA-CRANIAL:   Skull and facial bones are normal.  Minimal mucosal thickening in bilateral maxillary sinuses.   Bilateral mastoids are clear. Orbits are normal.   Impression: 1.  Acute right MCA territory infarct. No evidence of hemorrhagic transformation.    This is a preliminary report dictated by Dr..Alcacoas(Fellow,ERad). Unless a final report appears below these images have not been reviewed by an attending radiologist.     ATTENDING FINAL REPORT    FINDINGS/IMPRESSION (final):  I agree with the reported preliminary findings and impression, as above described (Cat 1).     I have personally reviewed the images and agree with  the report (or as edited).  CTA Head and Neck Angio  Narrative: EXAMINATION:   CTA HEAD AND NECK ANGIO    CLINICAL INDICATION:  confusion, possible aphasia symptoms yesterday, stroke eval    TECHNIQUE:  ERAD CTA Head (Bleed)  Non-contrast head CT: MDCT through the head without contrast  CTA Head: MDCT through the head during intravenous injection of contrast.  3D MIP slab reformations were obtained.  3D volume reformations were performed on an independent workstation.  Age specific parameters were used for radiation exposure.  ER Neck CTA - Trauma and Stroke  Non-contrast head CT:  MDCT through the head without contrast  CTA Head and neck: MDCT through the head and neck during intravenous injection of contrast.  3D MIP slab reformations through the neck and head.  3D volume reformations were performed on an independent workstation.  Age specific parameters were used for radiation exposure.    CONTRAST:    IOHEXOL 350 MG/ML IV SOLN,100 mL Intravenous,06/23/2020 1331    COMPARISON:  Noncontrast CT head 04/18/2019.    FINDINGS:    NON-CONTRAST HEAD CT:   MASS EFFECT & VENTRICLES: No significant midline shift. The ventricles are symmetric and similar in size and configuration. There is mild generalized cortical volume loss with commensurate ventricular dilation.   BRAIN:  Wedge-shaped loss of gray-white differentiation within the right temporoparietal region suspicious for acute infarct concerning for acute infarct. No evidence of hemorrhagic conversion.  EXTRA-AXIAL:  No acute extra-axial fluid collection.   EXTRA-CRANIAL:   No acute calvarial or facial bone fracture. Small mucous retention cysts within the bilateral maxillary sinuses. Mastoids are clear. Orbits are unremarkable.     POST-CONTRAST HEAD CT:   ENHANCEMENT: Unremarkable intracranial enhancement.    HEAD CTA:  ARTERIES:  Multiple punctate calcified lesions noted through the left MCA artery, most notable within the right M2 branch (11/233 and 13/47) with  decreased density of contrast filling distally near the region of infarct, likely reflecting nonocclusive calcified thrombus. Additional diffuse multifocal luminal irregularities involving the right greater than intracranial vessels, likely secondary to atherosclerotic disease. Mild calcified atherosclerotic disease involving the cavernous segments of the internal carotid arteries without significant stenoses. Otherwise, no aneurysm, AVM, stenosis or occlusion.   VARIANTS: Circle of Jannifer Franklin is normally developed.  No other vascular anatomic variants.  VEINS:  Dural sinuses and deep venous structures are unremarkable.      NECK CTA:  AORTIC ARCH:  Mild calcified atherosclerotic disease of the aortic arch without stenoses.  CAROTIDS:  Minimal calcified atherosclerotic disease involving the bilateral carotid bulbs without stenosis. Internal carotid artery origins show no stenosis. Cervical ICA's are pain without stenosis.   VERTEBRALS:  Mild calcified atherosclerotic disease at the origin of the right vertebral artery with mild stenosis of the left vertebral artery origin is patent without stenosis. Cervical vertebral segments are patent without stenosis.    OTHER: 1.2 cm hypodense left thyroid gland and 1 cm right thyroid gland nodule nodule.  Impression: 1. CTA head: Large area of loss of gray-white differentiation within the right temporoparietal region suspicious for acute infarct concerning for acute infarct. No evidence of hemorrhagic conversion.  2. CTA head: Multiple punctate calcified lesions noted through the left MCA artery, most notable within the right M2 branch with decreased density of contrast filling distally near the region of infarct, likely reflecting nonocclusive calcified thrombus. Additional diffuse multifocal luminal irregularities involving the right greater than intracranial vessels, likely secondary to atherosclerotic disease  3. CTA neck: No high-grade stenosis, dissection or occlusion.    The  above findings were communicated to Dr. Mina Marble by Dr. Trellis Moment at 06/23/2020 1:50 PM.  XR Chest 1 View  Narrative: EXAMINATION:  XR CHEST 1 VW    CLINICAL INDICATION:   earache, cough, confusion    COMPARISON:    Chest x-ray dated April 18, 2019.    FINDINGS AND   Impression: Lines and tubes: None.    Lungs: Plate atelectasis in the right lung base. Otherwise lungs are clear.    Elevation of the right hemidiaphragm, as before.    Pleura: No effusion. No pneumothorax.    Heart and mediastinum: Probable cardiomegaly, unchanged.    Bones: No acute or suspicious abnormality.  Evidence of median sternotomy with sequela of prior CABG.    I have personally reviewed the images and agree with the report (or as edited).          ASSESSMENT/PLAN      Roan Sawchuk is a 75 year old right-handed F with history of CAD s/p CABG and migraine headaches with aura who presented to the ED after acute episode of confusion with dizziness 1/12, found to have R MCA territory ischemic stroke.    #Right parietal stroke  R MCA territory stroke seen on MRI brain. Less likely seizure given new findings, no spot eeg completed. In terms of stroke, possible causes of ischemic stroke are cardioembolic (afib, post-MI, valvular disease, LV thrombus, PFO/ASA), large vessel disease (extracranial, intracranial, aortic arch), small vessel (lacunar) vs other causes (dissection, infection, hypercoagulable state, drug abuse). Among the possibilities listed above, patient's etiology thought to favor cardioembolic, given extensive cardiac history and mildly elevated BNP.  Deficits include L visual field cut, at times inattentive/distractable.     Ischemic stroke work-up:  - Admit neurology w/ telemetry  - CT head non-contrast completed  - CTA head/neck completed  - Brain MRI stroke protocol completed  - TTE (+/- bubble study)  - EKG sinus rhythm with first degree block  - CBC, BMP, coags  - HbA1c 5.7%, fasting lipid panel T 283, TG 168, HDL 46, LDL 203, BNP  174    Ischemic stroke plan:  - Telemetry  - Euthermia: APAP 64m PR scheduled x48 hours  - Euglycemia: corrective insulin, BG goal <  180  - Permissive HTN: goal BP < 220/120   -- hold all anti-HTN medication   -- PRN labetalol per protocol if not at goal  - s/p ASA 352m PO  - ASA 842mdaily  - follow up imaging findings to determine if DAPT-eligible  - Atorvastatin 80 mg  - If IVF needed, use NS (avoid D5 or 1/2NS)  - passed for regular diet  - PT/OT/SLP--> per OT, will need assistance with all IADLs  - Consider Sleep Study at time of discharge    #History of CABG  #Coronary artery disease  -Continue metoprolol  -continue ASA and statin as above    #Hypertension  -Hold antihypertensives for now    #UTI  Leuk esterase +, nitrite -.  -f/u Ucx  -CTX 1g daily (1/14- ), narrow as able    Inpatient Checklist:    Prophylaxis: enoxaparin 4042mID  Lines/Drains/Airways: PIV  Disposition: Acute care, pending PT/OT recs. Lives in independent senior housing.  Code Status: Full Code    Contacts: Primary Emergency Contact:  Tyrone Schimkeome Phone: [phone]> senior housing staff  SerHulan Saasaughter): [phone] 1/14

## 2020-06-24 NOTE — Progress Notes (Signed)
Telemetry Progress Note    Admitted to telemetry this shift at:  0000    Cardiac Rhythm Summary: Night shift and Abnormal PR: 0.22    Rhythm: Sinus rhythm (SR), Sinus tachycardia (ST) and 1st Degree AV Block ,SB, SA    Rate exact: Low:49 To High: 123     Very rare PVCs

## 2020-06-24 NOTE — Progress Notes (Signed)
CCN Assessment Note    Assessment Type: Discharge Planning Assessment ( Face-to-Face)     Visit Information  Admission diagnosis: Seizure [facility])  Inpatient admission date: 06/23/2020    Primary Insurance Coverage  [employer]   Insurance Subscriber ID  [id number]  Verified PCP: Debroah Loop, MD   [place] Prior to Admit:  Home  Verified Home Address:    [address]  [address]  [place] [address]   Primary Caregiver   Self, pt lives alone  Support Systems  Children  Functional Limitations Prior to Admit  No Limitations  Physical/Cognitive/Functional History   Pt lives independently at [facility], Media planner access. Pt has friends in this apartment complex. Pt goes shopping, drives, occasionally uses the bus line, involved with the [place] [facility] Prior to Admit  No/minimal barriers to healthcare  Barriers to Discharge:   Evaluations by therapies, medical clearance  Anticipated Discharge Date:  [facility] 06/25/2020    CCN Coordination:    Hospital Discharge Plan  Discharge to most likely home    Preferred Pharmacy for Discharge Meds:  [facility] [place] [phone]      Discharge Transportation:  Pt states her son most likely will provide transportation home  CCN Interventions  Consult with healthcare team;Discharge planning

## 2020-06-24 NOTE — Nursing Note (Signed)
Patient Summary  75yo woman with hx AS, CAD s/p CABG, mild cognitive impairment, presenting with one day of confusion and possible aphasia.    A&Ox3 confused to time. Very poor attention and concentration. PERRL. No facial droop. Tele 1st AVB. FC, MAE 5/5. Ambulates with SBA. Continent of B&B. Continue on IV ABX for GU infection. PT,OT,SPEECH work with her today.Waiting for dc plan.  Edited by: Carlynn Purl at 06/24/2020 1420    Illness Severity  stable

## 2020-06-25 ENCOUNTER — Other Ambulatory Visit (HOSPITAL_BASED_OUTPATIENT_CLINIC_OR_DEPARTMENT_OTHER): Payer: Self-pay

## 2020-06-25 ENCOUNTER — Inpatient Hospital Stay (HOSPITAL_BASED_OUTPATIENT_CLINIC_OR_DEPARTMENT_OTHER): Payer: Medicare HMO

## 2020-06-25 DIAGNOSIS — I6789 Other cerebrovascular disease: Secondary | ICD-10-CM

## 2020-06-25 HISTORY — PX: TRANSTHORACIC ECHO (TTE) COMPLETE: ECH110

## 2020-06-25 LAB — URINE C/S
Culture: 51000 — AB
Culture: >100000 — AB

## 2020-06-25 LAB — TRANSTHORACIC ECHO (TTE) COMPLETE
AoV max: 290.4 cm/s
AoV mean PG: 19.5 mmHg
Ascending aorta: 3.9 cm
E/E' ratio: 24.6
EF: 65.1 %
IVSd: 1.2 cm
LV Diastolic Volume Index (BP): 70.6 ml
LV Systolic Volume (BP): 24.6 ml
LVIDd: 4.4 cm
LVIDs: 2.5 cm
LVPWd: 1.2 cm
RV Free wall pk S': 10.2 cm/s
RVDd: 3.5 cm
RVDd: 3.5 cm

## 2020-06-25 MED ORDER — ATORVASTATIN CALCIUM 80 MG OR TABS
80.0000 mg | ORAL_TABLET | Freq: Every day | ORAL | 0 refills | Status: DC
Start: 2020-06-25 — End: 2020-07-22
  Filled 2020-06-25: qty 90, 90d supply, fill #0

## 2020-06-25 MED ORDER — ACETAMINOPHEN 325 MG OR TABS
650.0000 mg | ORAL_TABLET | Freq: Four times a day (QID) | ORAL | Status: DC | PRN
Start: 2020-06-25 — End: 2020-06-27
  Administered 2020-06-25 (×2): 650 mg via ORAL
  Filled 2020-06-25 (×2): qty 2

## 2020-06-25 NOTE — Nursing Note (Signed)
Patient Summary  75yo woman with h/o aortic stenosis, CAD s/p CABG, mild cognitive impairment, presenting with one day of confusion and possible aphasia. Found to have had an ischemic stroke on MRI.    A&Ox3-4, mildly confused vs aphasic when awoken. Very poor attention/concentration. Decreased peripheral vision; blurry vision when focusing on closeup objects, per pt. NIHSS 3. On tele; 1st degree AVB, intermittently bradycardic while sleeping. 1+ pitting edema BLE. SBA to BR.  Edited by: Lorelee New, RN at 06/25/2020 [phone]    Illness Severity  stable  Edited by: Lula Olszewski, RN at 06/24/2020 [phone]

## 2020-06-25 NOTE — Progress Notes (Signed)
Progress Note     Wendy [name] ("[facility]") - DOB: 11/13/1945 (74 year old female)  Gender Identity: Female  Preferred Pronouns: she/her/hers  Admit Date: 06/23/2020  Code Status: Full Code       CHIEF CONCERN / IDENTIFICATION:  Wendy [name] is a 74 year old right-handed F with history of CAD s/p CABG and migraine headaches with aura who presented to the ED after acute episode of confusion with dizziness 1/12, found to have R MCA ischemic stroke.     SUBJECTIVE   INTERVAL HISTORY:  - VSS  - HA overnight resolved with tylenol  - TTE completed  - dispo set up  - daughter Serene says husband and brother are covid +, cant take her home today  - d/c'd CTX due to no UTI sx    SCHEDULED MEDICATIONS:   aspirin, 81 mg, Daily  •  atorvastatin, 80 mg, Daily  •  enoxaparin, 40 mg, q12h SCH  •  metoprolol succinate ER, 50 mg, Daily    INFUSED MEDICATIONS:       PRN MEDICATIONS:  •  acetaminophen, 650 mg, q6h PRN  •  labetalol, 10-60 mg, PRN  •  perflutren lipid microsphere (Definity) in NS injection, 1-10 mL, Once PRN       OBJECTIVE     Vitals (Most recent in last 24 hrs)     T: 36.8 °C (06/25/20 0451)  BP: (!) 119/55 (06/25/20 0457)  HR: 61 (06/25/20 0451)  RR: 20 (06/25/20 0451)  SpO2: 94 % (06/25/20 0451) Room air  T range: Temp  Min: 36.3 °C  Max: 37 °C  Admit weight: (!) 101.5 kg (223 lb 12.3 oz) (06/23/20 2324)  Last weight: (!) 105 kg (231 lb 7.7 oz) (06/24/20 0024)       I&Os:     Intake/Output Summary (Last 24 hours) at 06/25/2020 1401  Last data filed at 06/25/2020 0900  Intake 370 ml   Output 5 ml   Net 365 ml       Physical Exam  General: well appearing F, NAD, lying in bed  HEENT: mmm  Card: warm and well perfused extremities  Pulm: in no respiratory distress  Neuro: At times inattentive, easily distracted. Alert and able to converse fluently. Left upper quadrant field cut. Other CN symmetric. 5/5 strength in extremities.     Labs (last 24 hours):   Chemistries  CBC  LFT  Gases, other   - - - -   -   AST: -  ALT: -  -/-/-/-   -/-/-/-   - - -   - >< -  AP: - T bili: -  Lact (a): - Lact (v): -   eGFR: - Ca: -   -   Prot: - Alb: -  Trop I: - D-dimer: -   Mg: - PO4: -  ANC: -     BNP: - Anti-Xa: -     ALC: -    INR: -      Imaging Results:  TTE read pending        ASSESSMENT/PLAN      Wendy [name] is a 74 year old right-handed F with history of CAD s/p CABG and migraine headaches with aura who presented to the ED after acute episode of confusion with dizziness 1/12, found to have R MCA territory ischemic stroke, admitted for workup and pending safe dispo.    #Right parietal stroke  R MCA territory stroke seen on MRI brain.   Possible causes of ischemic stroke are cardioembolic (afib, post-MI, valvular disease, LV thrombus, PFO/ASA), large vessel disease (extracranial, intracranial, aortic arch), small vessel (lacunar) vs other causes (dissection, infection, hypercoagulable state, drug abuse). Among the possibilities listed above, patient's etiology thought to favor cardioembolic, given extensive cardiac history and mildly elevated BNP.  Deficits include L visual field cut, at times inattentive/distractable.     Ischemic stroke work-up:  - CT head non-contrast completed  - CTA head/neck completed  - Brain MRI stroke protocol completed  - TTE (+/- bubble study), follow up read  - ECAT to mail home  - EKG sinus rhythm with first degree block  - HbA1c 5.7%, fasting lipid panel T 283, TG 168, HDL 46, LDL 203, BNP 174     Ischemic stroke plan:  - Telemetry  - Euthermia: APAP 650mg PR scheduled x48 hours  - Euglycemia: corrective insulin, BG goal < 180  - Permissive HTN: goal BP < 220/120    -- hold all anti-HTN medication    -- PRN labetalol per protocol if not at goal  - s/p ASA 325mg PO  - continue ASA 81mg daily + Plavix 75mg (indicated for her CAD s/p CABG)  - Atorvastatin 80 mg  - passed for regular diet    #Dispo  PT/OT/SLP--> needs assistance with all IADLs such as cooking, new medications, setting up medisets, driving,  errands. Patient's daughter reports that her husband and brother are COVID+ and cannot take her mom home today as originally planned. Patient is not safe to go to their home or back to her senior living facility without additional help. Lives in independent senior housing ([facility]) which does not have option of increased levels of care.  -TBW daughter 1/16 for updates on COVID tests/availability to help pt at home  -f/u with home health about initiation/timing of visits starting Tuesday (after [facility] wknd) vs facility search      #History of CABG  #Coronary artery disease  -Continue metoprolol  -continue ASA, Plavix and statin as above     #Hypertension  -Hold antihypertensives for now    #UTI  Leuk esterase +, nitrite -. No dysuria, frequency, urgency or hematuria.   -f/u Ucx  -d/c CTX    Inpatient Checklist:    Prophylaxis: enoxaparin 40mg BID  Lines/Drains/Airways: PIV  Disposition: Acute care, pending safe dispo. Lives in independent senior housing ([facility]).  Code Status: Full Code    Contacts: Primary Emergency Contact:   MYERS, VAHIV, Home Phone: [phone] --> senior housing staff  Serene (daughter): [phone] updated 1/14

## 2020-06-25 NOTE — Progress Notes (Signed)
CCN Progress Note    Visit Information  Admission diagnosis: Seizure [facility])  Inpatient admission date: 06/23/2020    Primary Insurance Coverage  [employer]   Insurance Subscriber [place]  [id number]     06/25/20 Update: CCN received word that pt may discharge home to independent living/[facility] with [facility]. CCN called pt's son and daughter, left confidential VM, awaiting call back.    Discharge Plan   Anticipated Living Situation Post Discharge/Needs:  home  Barriers to Discharge:   Evaluations by therapies, medical clearance  Anticipated Discharge Date:  [facility] 06/25/2020

## 2020-06-25 NOTE — Progress Notes (Signed)
Telemetry Progress Note    Cardiac Rhythm Summary: Night shift and Abnormal PR: 0.22    Rhythm: Sinus rhythm (SR), Sinus bradycardia (SB), Sinus arrhythmia and 1st Degree AV Block    Rate range: Low: 40's To High: 100's    Events: None    Rhythm changes during shift: No    Patient refused telemetry: No

## 2020-06-25 NOTE — Home Health Face To Face (Signed)
Date of Face to Face Encounter: 06/23/2020    This Face to Face Encounter was in whole, or in part, for the following medical condition(s) which is the primary reason for home health care:  Medical Condition(s): No diagnosis found.    I certify that, based on my findings, the following services are medically necessary for home health services: Nursing and [place] (only allowed if Nursing, PT, or ST are ordered):  OT, PT, SW    My clinical findings that support the need for the above services are: 75 year old right-handed lady with history of CAD s/p CABG and migraine headaches with aura who presented to the ED after acute episode of confusion with dizziness  06/22/2020.  Symptoms of transient dizziness and aphasia with near imaging showing right parietal lesion are most consistent with stroke, possibly compounded by seizure as a sequelae of ischemia.    Speech: Attention;Memory;Problem solving;Executive function    OT Assessment Results: Impaired ADL status;Impaired safety and/or judgement during ADL's;Impaired cognition;Visual deficit;Impaired IADL's.    EH:[id number] insight into deficits;Decreased awareness of errors;Decreased self monitoring;Difficulty grasping main idea;Decreased attention to detail.  Recommend assist with community  mobility as patient's perceptual and attention deficits in combination with her decreased balance and safety awareness. Recommend assist in the community and home health physical therapy    I certify that this patient is homebound because: absences from home require considerable and taxing effort and are for medical reasons or religious services and are infrequent or of short duration.    The [facility] (CMS) requires the hospitalist/attending signing the Face to Face Encounter documentation to identify the community physician in the discharge plan for home health care (the provider assuming ongoing management of the [place]).    Primary Care Physician: Debroah Loop, MD    Attending Name:    [place], [address] EDWIN  JOBE, KATHLEEN A  CREUTZFELDT, [place], [place]  06/25/2020 at 10:46 AM

## 2020-06-25 NOTE — Nursing Note (Signed)
Patient Summary  75yo woman with h/o aortic stenosis, CAD s/p CABG, mild cognitive impairment, presenting with one day of confusion and possible aphasia. Found to have had an ischemic stroke on MRI.    A&Ox3-4, mildly confused vs aphasic when awoken. Very poor attention/concentration. Decreased peripheral vision; blurry vision when focusing on closeup objects, per pt. NIHSS 3. On tele; 1st degree AVB,  1+ pitting edema BLE. SBA to Br.waiting for dc plan.  Edited by: Carlynn Purl at 06/25/2020 1615    Illness Severity  stable

## 2020-06-25 NOTE — Progress Notes (Signed)
Telemetry Progress Note    Cardiac Rhythm Summary: Day shift and Abnormal PR: 0.21    Rhythm: Sinus rhythm (SR), Sinus bradycardia (SB) and Sinus arrhythmia    Rate exact: Low: 43 To High: 100    Ectopy: Very rare PVC's, Extremely rare PAC's

## 2020-06-25 NOTE — Discharge Instructions (Signed)
Discharge Instructions for Stroke  You have a high risk for a stroke, or a TIA (transient ischemic attack). During a stroke, blood stops flowing to part of your brain. This can damage areas in the brain that control other parts of the body. Symptoms from a stroke depend on which part of the brain has been affected.   Stroke risk factors  Once you’ve had a stroke, you’re at greater risk for another one. Listed below are some other factors that can raise your risk for a stroke:   · High blood pressure  · High cholesterol  · Cigarette or cigar smoking  · Diabetes  · Carotid or other artery disease  · Atrial fibrillation, atrial flutter, or other heart disease  · Not being physically active  · Obesity  · Certain blood disorders  such as sickle cell anemia  · Drinking too much alcohol  · Abusing street drugs  · Race  · Gender  · Family history of stroke  · Diet high in salty, fried, or greasy foods  Changes in daily living  Doing some everyday tasks may be hard after you’ve had a stroke. But you can learn new ways to manage your daily activities. In fact, doing daily activities may help you to regain muscle strength. This can also help your affected arm or leg work more normally. Be patient. Give yourself time to adjust. And appreciate the progress you make.   Daily activities  You may be at risk of falling. Make changes to your home to help you walk more easily. A therapist will decide if you need an assistive device, such as a cane or walker, to walk safely.   You may need to see an occupational therapist (OT). Or you may see a physical therapist (PT). These healthcare providers can help you to learn new ways of doing things. For example, you may need to make changes in how you bathe or dress. You may also need a speech therapist. This is someone who helps you speak normally again and be able to swallow.   Tips for showering or bathing  · Test the water temperature with a hand or foot that was not affected by the  stroke.  · Use grab bars, a shower seat, a hand-held showerhead, and a long-handled brush.  · Use any other assistive device as advised by your therapists.  Tips for getting dressed  · Dress while sitting, starting with the affected side or limb.  · Wear shirts that pull easily over your head. Wear pants or skirts with elastic waistbands.  · Use zippers with loops attached to the pull tabs.    Lifestyle changes  · Take your medicines exactly as directed. Don’t skip doses.  · Begin an exercise program. Ask your provider how to get started. Ask how much activity you should try to get every day or week. You can benefit from simple activities such as walking or gardening.  · Limit how much alcohol you drink.  · Control your cholesterol level. Follow your provider’s advice about how to do this.  · If you are a smoker, quit now. Join a stop-smoking program to improve your chances of success. Ask your provider about medicines or other methods to help you quit.  · Learn stress management methods. These can help you deal with stress in your home and work life.  Diet  Your healthcare provider will guide you on changes you may need to make to your diet. He or she may advise that you see   a registered dietitian for help with diet changes. The changes can improve your cholesterol, blood pressure, and blood sugar. Changes may include:   · Reducing the amount of fat and cholesterol you eat  · Reducing the amount of salt (sodium) in your diet, especially if you have high blood pressure  · Eating more fresh vegetables and fruits  · Eating more lean proteins, such as fish, poultry, and beans and peas (legumes)  · Eating less red meat and processed meats  · Using low-fat dairy products  · Limiting vegetable oils and nut oils  · Limiting sweets and processed foods such as chips, cookies, and baked goods  · Not eating trans fats. These are often found in processed foods. Don't eat any food that has hydrogenated listed in its ingredients.   Follow-up care  · Keep your medical appointments. Close follow-up is important to stroke rehabilitation and recovery.  · Some medicines require blood tests to check for progress or problems. Keep follow-up appointments for any blood tests ordered by your providers.    Call 911  Call 911 right away if you have any of the following symptoms of stroke:   · Weakness, tingling, or loss of feeling on one side of your face or body  · Sudden double vision or trouble seeing in one or both eyes  · Sudden trouble talking or slurred speech  · Trouble understanding others  · Sudden, severe headache  · Dizziness, loss of balance, or a sense of falling  · Blackouts or seizures  [employer] last reviewed this educational content on 06/11/2018  © 2000-2020 [employer] [address], [place] [address]. All rights reserved. This information is not intended as a substitute for professional medical care. Always follow your healthcare professional's instructions.

## 2020-06-26 DIAGNOSIS — I2510 Atherosclerotic heart disease of native coronary artery without angina pectoris: Secondary | ICD-10-CM

## 2020-06-26 DIAGNOSIS — R8279 Other abnormal findings on microbiological examination of urine: Secondary | ICD-10-CM

## 2020-06-26 DIAGNOSIS — N390 Urinary tract infection, site not specified: Secondary | ICD-10-CM

## 2020-06-26 DIAGNOSIS — Z9889 Other specified postprocedural states: Secondary | ICD-10-CM

## 2020-06-26 LAB — COVID-19 CORONAVIRUS QUALITATIVE PCR: COVID-19 Coronavirus Qual PCR Result: NOT DETECTED

## 2020-06-26 LAB — TROPONIN_I
Troponin_I Interpretation: NORMAL
Troponin_I: <0.03 ng/mL (ref <0.04)

## 2020-06-26 MED ORDER — ALUM & MAG HYDROXIDE-SIMETH 200-200-20 MG/5ML OR SUSP
15.0000 mL | Freq: Once | ORAL | Status: AC
Start: 2020-06-26 — End: 2020-06-26
  Administered 2020-06-26: 15 mL via ORAL
  Filled 2020-06-26: qty 30

## 2020-06-26 MED ORDER — POLYETHYLENE GLYCOL 3350 17 G OR PACK
17.0000 g | PACK | Freq: Every day | ORAL | Status: DC | PRN
Start: 2020-06-26 — End: 2020-06-28

## 2020-06-26 MED ORDER — METOPROLOL SUCCINATE ER 25 MG OR TB24
25.0000 mg | EXTENDED_RELEASE_TABLET | Freq: Every day | ORAL | Status: DC
Start: 2020-06-27 — End: 2020-06-27
  Filled 2020-06-26: qty 1

## 2020-06-26 MED ORDER — SENNOSIDES 8.6 MG OR TABS
8.6000 mg | ORAL_TABLET | Freq: Every evening | ORAL | Status: DC | PRN
Start: 2020-06-26 — End: 2020-06-28
  Administered 2020-06-26: 8.6 mg via ORAL
  Filled 2020-06-26: qty 1

## 2020-06-26 MED ORDER — BISACODYL 10 MG RE SUPP
10.0000 mg | Freq: Every day | RECTAL | Status: DC | PRN
Start: 2020-06-26 — End: 2020-06-28

## 2020-06-26 NOTE — Progress Notes (Signed)
Telemetry Progress Note    Cardiac Rhythm Summary: Night shift and Abnormal PR: 0.22    Rhythm: Sinus bradycardia (SB), Sinus arrhythmia and 1st Degree AV Block    Rate range: Low: 40's To High: 90's    Ectopy: Very rare PVCs

## 2020-06-26 NOTE — Progress Notes (Signed)
Telemetry Progress Note    Admitted to telemetry this shift at:      Cardiac Rhythm Summary: Day shift and Abnormal PR: 0.24    Rhythm: Sinus rhythm (SR) and Sinus bradycardia (SB)    Rate exact: Low: 42 To High: 91    Rate range: Low: 50 To High: 60    Telemetry D/C'd at:     Additional Information:

## 2020-06-26 NOTE — Nursing Note (Signed)
Illness Severity  Stable    Patient Summary  75yo woman with h/o aortic stenosis, CAD s/p CABG, mild cognitive impairment, presenting with one day of confusion and possible aphasia. Found to have had an ischemic stroke on MRI.    A/Ox4, NIHSS 1, PERRL - reports decreased peripheral vision - wears glasses, mildly HOH, clear speech - some word finding and aphasia present, makes needs known independently and when prompted, can be forgetful, F/C but poor attention span/concentration - needs repetition to perform tasks at times, MAE with good strength, sensation intact, VSS, tele - 1st degree AVB, 1+ pitting edema in BLE, RA, general diet with thin liquids, meds whole, cont bowel and bladder, + BT + flatus, reports feeling constipated - bowel meds ordered and given, voiding without issue, skin grossly intact, SBA OOB, BA on for safety, intermittent HA - PRN tylenol given - reports good relief

## 2020-06-26 NOTE — Nursing Note (Signed)
Patient Summary  75yo woman with h/o aortic stenosis, CAD s/p CABG, mild cognitive impairment, presenting with one day of confusion and possible aphasia. Found to have had an ischemic stroke on MRI.    A&Ox4, mildly aphasic when awoken. Very poor attention/concentration. Decreased peripheral vision; blurry vision when focusing on closeup objects, per pt. NIHSS 1. Intermittent HA improved by acetaminophen. On tele; 1st degree AVB,  1+ pitting edema BLE. HR in 40s while sleeping, high 40s to low 50s while awake. EKG & trop (for c/o "crushing" upper abd pain) completed per MD; pain resolved independently just prior to GI cocktail admin, given per pt preference. SBA to BR, one BM this shift.  Edited by: Lorelee New, RN at 06/26/2020 6269    Illness Severity  stable  Edited by: Lula Olszewski, RN at 06/24/2020 [phone]

## 2020-06-26 NOTE — Progress Notes (Signed)
Progress Note     Wendy [name]") - DOB: [birth year] 75 year old female)  Gender Identity: Female  Preferred Pronouns: she/her/hers  Admit Date: 06/23/2020  Code Status: Full Code       CHIEF CONCERN / IDENTIFICATION:  Wendy [name] is a 75 year old right-handed F with history of CAD s/p CABG and migraine headaches with aura who presented to the ED after acute episode of confusion with dizziness 1/12, found to have R MCA ischemic stroke.     SUBJECTIVE   INTERVAL HISTORY:  - Patient had sinus bradycardia overnight  - Later complained of crushing upper abdominal pain. EKG unchaged from tele, trop negative. Symptoms resolved with GI cocktail  - No complaints this morning, is in a cheerful mood.     SCHEDULED MEDICATIONS:   aspirin, 81 mg, Daily    atorvastatin, 80 mg, Daily    enoxaparin, 40 mg, q12h [facility]    [START ON 06/27/2020] metoprolol succinate ER, 25 mg, Daily    INFUSED MEDICATIONS:       PRN MEDICATIONS:    acetaminophen, 650 mg, q6h PRN    labetalol, 10-60 mg, PRN    perflutren lipid microsphere (Definity) in NS injection, 1-10 mL, Once PRN       OBJECTIVE     Vitals (Most recent in last 24 hrs)     T: 36 C (06/26/20 0818)  BP: 140/68 (06/26/20 0818)  HR: (!) 57 (06/26/20 0406)  RR: 16 (06/26/20 0818)  SpO2: 96 % (06/26/20 0818) Room air  T range: Temp  Min: 36 C  Max: 36.6 C  Admit weight: (!) 101.5 kg (223 lb 12.3 oz) (06/23/20 2324)  Last weight: (!) 105 kg (231 lb 7.7 oz) (06/24/20 0024)       I&Os:     Intake/Output Summary (Last 24 hours) at 06/26/2020 1015  Last data filed at 06/25/2020 1300  Intake 300 ml   Output    Net 300 ml       Physical Exam  General: well appearing F, NAD, lying in bed  HEENT: mmm  Card: warm and well perfused extremities  Pulm: in no respiratory distress  Neuro: At times inattentive, easily distracted. Alert and able to converse fluently. Left upper quadrant field cut noticed on previous exams although hard to confirm today as shew as quite  distractible. Other CN symmetric. Moves all extremities briskly. Very slight ataxia on the R compared to left.     Labs (last 24 hours):   Chemistries  CBC  LFT  Gases, other   - - - -   -   AST: - ALT: -  -/-/-/-   -/-/-/-   - - -   - >< -  AP: - T bili: -  Lact (a): - Lact (v): -   eGFR: - Ca: -   -   Prot: - Alb: -  Trop I: <0.03 D-dimer: -   Mg: - PO4: -  ANC: -     BNP: - Anti-Xa: -     ALC: -    INR: -      Imaging Results:  No new imaging    Diagnostics:  ECHOCARDIOGRAM  Conclusion  1. The left ventricle is normal in size. There is mild concentric increase in  the wall thickness of the left ventricle. Global left ventricular function is  normal (biplane EF 65%). The left ventricular average longitudinal peak  systolic strain is borderline (-17%). No regional wall motion  abnormalities  are present.  2. The right ventricle is normal size. The right ventricular systolic  function is normal.  3. The aortic valve is mildly calcified. There is mild to moderate valvular  aortic stenosis (Vmax 2.9 m/s, MG 20 mmHg, AVA 1.4 cm2). Mild to moderate  aortic regurgitation is present.  4. Mild mitral annular calcification is present. The mitral annular  calcification is posterior.  5. Agitated saline contrast study was negative at rest, with Valsalva, and  with cough.  6. Pulmonary artery systolic pressure could not be estimated due to an  insufficient tricuspid regurgitant jet.  7. There is no pericardial effusion.    Compared with the baseline TTE from 11/03/2019, no significant changes are  noted.          ASSESSMENT/PLAN      Wendy [name] is a 75 year old right-handed F with history of CAD s/p CABG and migraine headaches with aura who presented to the ED after acute episode of confusion with dizziness 1/12, found to have R MCA territory ischemic stroke, admitted for workup and pending safe dispo.    #Right parietal stroke  R MCA territory stroke seen on MRI brain. Possible causes of ischemic stroke are  cardioembolic (afib, post-MI, valvular disease, LV thrombus, PFO/ASA), large vessel disease (extracranial, intracranial, aortic arch), small vessel (lacunar) vs other causes (dissection, infection, hypercoagulable state, drug abuse). Among the possibilities listed above, patient's etiology thought to favor cardioembolic, given extensive cardiac history and mildly elevated BNP.  Deficits include L visual field cut, at times inattentive/distractable.     Ischemic stroke work-up:  - CT head non-contrast completed  - CTA head/neck completed  - Brain MRI stroke protocol completed  - TTE: No shunt, normal EF, mild to moderate aortic stenosis  - ECAT to mail home  - EKG sinus rhythm with first degree block  - HbA1c 5.7%, fasting lipid panel T 283, TG 168, HDL 46, LDL 203, BNP 174    Ischemic stroke plan:  - Telemetry  - Euglycemia: corrective insulin, BG goal < 180  - Goal normotension, currently meeting without intervention  - s/p ASA 356m PO  - continue ASA 842mdaily + Plavix 759mindicated for her CAD s/p CABG)  - Atorvastatin 80 mg    #Dispo  PT/OT/SLP--> needs assistance with all IADLs such as cooking, new medications, setting up medisets, driving, errands. Patient's daughter reports that her husband and brother are COVID+ and cannot take her mom home today as originally planned. Patient is not safe to go to their home or back to her senior living facility without additional help. [facility].  - TBW daughter regarding any family member able to help with dispo  - Should be able to go back to her current home with family assistance and outpatient therapies  - Home health face to face for therapies completed 1/15    #History of CABG  #Coronary artery disease  -Continue metoprolol, decreased to 71m26mven bradycardia  -continue ASA, Plavix and statin as above    #Hypertension  - No needed at this time    #UTI  Leuk esterase +,  nitrite -. U/a with mixed flora No dysuria, frequency, urgency or hematuria. CTX stopped for reasons listed.  -CTM for symptoms    Inpatient Checklist:    Prophylaxis: enoxaparin 40mg20m  Lines/Drains/Airways: PIV  Disposition: Acute care, pending safe dispo. Lives in independent senior  housing [facility]).  Code Status: Full Code    Contacts: Primary Emergency ContactTyrone Schimke, Home Phone: [phone] --> senior housing staff  Hulan Saas (daughter): [phone] updated 1/16

## 2020-06-27 DIAGNOSIS — R001 Bradycardia, unspecified: Secondary | ICD-10-CM

## 2020-06-27 MED ORDER — METOPROLOL SUCCINATE ER 12.5 MG SPLIT TABLET
12.5000 mg | Freq: Every day | ORAL | Status: DC
Start: 2020-06-28 — End: 2020-06-27

## 2020-06-27 MED ORDER — ACETAMINOPHEN 500 MG OR TABS
1000.0000 mg | ORAL_TABLET | Freq: Four times a day (QID) | ORAL | Status: DC
Start: 2020-06-27 — End: 2020-06-28
  Administered 2020-06-27 – 2020-06-28 (×3): 1000 mg via ORAL
  Filled 2020-06-27 (×3): qty 2

## 2020-06-27 MED ORDER — METOPROLOL SUCCINATE ER 12.5 MG SPLIT TABLET
12.5000 mg | Freq: Every day | ORAL | Status: DC
Start: 2020-06-27 — End: 2020-06-28
  Administered 2020-06-27 – 2020-06-28 (×2): 12.5 mg via ORAL
  Filled 2020-06-27 (×2): qty 1

## 2020-06-27 NOTE — Progress Notes (Signed)
Progress Note     Takiyah Bohnsack [facility]") - DOB: [birth year] 75 year old female)  Gender Identity: Female  Preferred Pronouns: she/her/hers  Admit Date: 06/23/2020  Code Status: Full Code       CHIEF CONCERN / IDENTIFICATION:  Karthika Glasper is a 75 year old right-handed F with history of CAD s/p CABG and migraine headaches with aura who presented to the ED after acute episode of confusion with dizziness 1/12, found to have R MCA ischemic stroke.     SUBJECTIVE   INTERVAL HISTORY:  -NAEO. HR in 50s when sleeping, asymptomatic. Did not receive metop 25 due to low HR.   -awaiting safe dispo   -complains of headache with pain on R upper scalp, better with manual pressure. Ordered tylenol scheduled and ice pack.  -some difficulty and delay in following instructions, but able to put on shoes, get out of bed and walk down hallway  -no new vision changes    SCHEDULED MEDICATIONS:     acetaminophen, 1,000 mg, q6h [facility]    aspirin, 81 mg, Daily    atorvastatin, 80 mg, Daily    enoxaparin, 40 mg, q12h [facility]    metoprolol succinate ER, 12.5 mg, Daily    INFUSED MEDICATIONS:       PRN MEDICATIONS:  bisacodyl, 10 mg, Daily PRN    labetalol, 10-60 mg, PRN    polyethylene glycol 3350, 17 g, Daily PRN    senna, 8.6 mg, q HS PRN       OBJECTIVE     Vitals (Most recent in last 24 hrs)     T: 36.4 C (06/27/20 0825)  BP: (!) 141/64 (06/27/20 0825)  HR: (!) 53 (06/27/20 0825)  RR: 16 (06/27/20 0825)  SpO2: 98 % (06/27/20 0825) Room air  T range: Temp  Min: 36 C  Max: 36.4 C  Admit weight: (!) 101.5 kg (223 lb 12.3 oz) (06/23/20 2324)  Last weight: (!) 105 kg (231 lb 7.7 oz) (06/24/20 0024)       I&Os:     Intake/Output Summary (Last 24 hours) at 06/27/2020 1135  Last data filed at 06/27/2020 1000  Intake 360 ml   Output    Net 360 ml       Physical Exam  General: well appearing F, NAD, sitting up at side of bed  HEENT: mmm, no redness, swelling or trauma over R scalp where pain is present  Card: warm and well perfused  extremities  Pulm: breathing comfortably  Neuro: At times inattentive, easily distracted. Alert and able to converse fluently. Left quadrant field cut, lower>upper. Other CN symmetric. Moves all extremities briskly. Wide based gait.     Labs (last 24 hours):   Chemistries  CBC  LFT  Gases, other   - - - -   -   AST: - ALT: -  -/-/-/-   -/-/-/-   - - -   - >< -  AP: - T bili: -  Lact (a): - Lact (v): -   eGFR: - Ca: -   -   Prot: - Alb: -  Trop I: - D-dimer: -   Mg: - PO4: -  ANC: -     BNP: - Anti-Xa: -     ALC: -    INR: -      Imaging Results:  TransTHORACIC echo (TTE) complete  Conclusion  1. The left ventricle is normal in size. There is mild concentric increase in  the wall thickness  of the left ventricle. Global left ventricular function is  normal (biplane EF 65%). The left ventricular average longitudinal peak  systolic strain is borderline (-17%). No regional wall motion abnormalities  are present.  2. The right ventricle is normal size. The right ventricular systolic  function is normal.  3. The aortic valve is mildly calcified. There is mild to moderate valvular  aortic stenosis (Vmax 2.9 m/s, MG 20 mmHg, AVA 1.4 cm2). Mild to moderate  aortic regurgitation is present.  4. Mild mitral annular calcification is present. The mitral annular  calcification is posterior.  5. Agitated saline contrast study was negative at rest, with Valsalva, and  with cough.  6. Pulmonary artery systolic pressure could not be estimated due to an  insufficient tricuspid regurgitant jet.  7. There is no pericardial effusion.    Compared with the baseline TTE from 11/03/2019, no significant changes are  noted.       ASSESSMENT/PLAN      Vernel Donlan is a 75 year old right-handed F with history of CAD s/p CABG and migraine headaches with aura who presented to the ED after acute episode of confusion with dizziness 1/12, found to have R MCA territory ischemic stroke, admitted for workup and pending safe dispo.    #Right parietal  stroke  R MCA territory stroke seen on MRI brain. Possible causes of ischemic stroke are cardioembolic (afib, post-MI, valvular disease, LV thrombus, PFO/ASA), large vessel disease (extracranial, intracranial, aortic arch), small vessel (lacunar) vs other causes (dissection, infection, hypercoagulable state, drug abuse). Among the possibilities listed above, patient's etiology thought to favor cardioembolic, given extensive cardiac history and mildly elevated BNP.  Deficits include L visual field cut, at times inattentive/distractable.     Ischemic stroke work-up:  - CT head non-contrast completed  - CTA head/neck completed  - Brain MRI stroke protocol completed  - TTE: No shunt, normal EF, mild to moderate aortic stenosis  - ECAT to mail home  - EKG sinus rhythm with first degree block  - HbA1c 5.7%, fasting lipid panel T 283, TG 168, HDL 46, LDL 203, BNP 174    Ischemic stroke plan:  - Telemetry  - Euglycemia: corrective insulin, BG goal < 180  - Goal normotension, currently meeting without intervention  - s/p ASA 330m PO  - continue ASA 844mdaily + Plavix 7533mindicated for her CAD s/p CABG)  - Atorvastatin 80 mg    #Dispo  PT/OT/SLP--> needs assistance with all IADLs such as cooking, new medications, setting up medisets, driving, errands. Patient's daughter reports that her husband and brother are COVID+ and cannot take her mom home today as originally planned. Patient is not safe to go to their home or back to her senior living facility without additional help. [facility] of care.  - TBW daughter regarding any family member able to help with dispo  - Should be able to go back to her current home with family assistance and outpatient therapies  - Home health face to face for therapies completed 1/15    #History of CABG  #Coronary artery disease  -Continue metoprolol, decreased to 12.5 mg given bradycardia, can inc to 25 if  tolerates  -continue ASA, Plavix and statin as above    #Bradycardia  Asymptomatic to 40s-50s when sleeping.   -repeat EKG  -titrate metoprolol as above    #Hypertension  -normotensive, no meds needed    #c/f  UTI  Leuk esterase +, nitrite -. U/a with 100k mixed flora. No UTI sx of dysuria, frequency, urgency or hematuria and CTX stopped.     Inpatient Checklist:    Prophylaxis: enoxaparin 34m BID  Lines/Drains/Airways: PIV  Disposition: Acute care, pending safe dispo. Lives in independent [facility].  Code Status: Full Code    Contacts: Primary Emergency Contact:Tyrone Schimke Home Phone: [phone]--> senior housing staff  SHulan Saas(daughter): [phone] 1/16

## 2020-06-27 NOTE — Nursing Note (Addendum)
Illness Severity  Stable    Patient Summary  75yo woman with h/o aortic stenosis, CAD s/p CABG, mild cognitive impairment, presenting with one day of confusion and possible aphasia. Found to have had an ischemic stroke on MRI.     A/Ox4, NIHSS 1, PERRL - reports decreased peripheral vision - wears glasses, has a hard time focusing on close objects, reports waxing and waning presentation of "auras" that relate to her ongoing migraines, mildly HOH, clear speech - some word finding and aphasia present, makes needs known independently and when prompted, can be forgetful and confused at times but easily reoriented, F/C but poor attention span/concentration - needs repetition to perform tasks at times, can be left alone for multiple hours and pt does just fine, MAE with good strength, sensation intact, VSS, tele - SB/NSR with 1st degree AVB, 1+ pitting edema in BLE, RA, general diet with thin liquids, meds whole, cont bowel and bladder, + BT + flatus, 1 BM, voiding without issue, skin grossly intact, independent OOB, intermittent HA - tylenol given - reports good relief

## 2020-06-27 NOTE — Progress Notes (Signed)
Telemetry Progress Note        Cardiac Rhythm Summary: Night shift    Rhythm: Sinus rhythm (SR) and Sinus bradycardia (SB)    Rate exact: Low: 46 To High: 67      Ectopy:: Very rare PACs with some non-conducted              Events: Other: None     Rhythm changes during shift at times: None    Patient refused telemetry: No

## 2020-06-27 NOTE — Home Health Face To Face (Signed)
Date of Face to Face Encounter: 06/23/2020    This Face to Face Encounter was in whole, or in part, for the following medical condition(s) which is the primary reason for home health care:  Medical Condition(s): (I63.511) Acute ischemic cerebrovascular accident (CVA) involving right middle cerebral artery territory [facility])  (primary encounter diagnosis)    I certify that, based on my findings, the following services are medically necessary for home health services: Nursing and Speech Therapy    Ancillary Services (only allowed if Nursing, PT, or ST are ordered):  OT and Marty    My clinical findings that support the need for the above services are: Wendy [name] is a17 year oldright-handed Fwith history ofCAD s/p CABG andmigraine headaches with aura who presented to the ED after acute episode of confusion with dizziness 1/12, found to have R MCA ischemic stroke.    I certify that this patient is homebound (.i.e. absences from require considerable and taxing effort and are for medical reasons or religious services and are infrequent or of short duration) because: She requires direct assistance for any complex non routine activities of daily living and total assistance for planning, organization, attention to detail, new medication mgmt, meal preparation, making appointments and driving. She needs supervision for any new environment for mobility. As a result, leaving home is a taxing effort for patient and family members who must provide constant assistance during outings.     The [facility] Services (CMS) requires the hospitalist/attending signing the Face to Face Encounter documentation to identify the community physician in the discharge plan for home health care (the provider assuming ongoing management of the [place]).    Primary Care Physician: Debroah Loop, MD    Attending Name:    [place], [address] [place]  JOBE, KATHLEEN A  CREUTZFELDT, [place], [place]  06/27/2020 at 11:50 AM

## 2020-06-27 NOTE — Progress Notes (Signed)
Telemetry Progress Note    Admitted to telemetry this shift at:      Cardiac Rhythm Summary: Day shift and Abnormal PR: 0.21    Rhythm: Sinus rhythm (SR), Sinus tachycardia (ST) and Sinus bradycardia (SB)    Rate exact: Low: 45 To High: 101    Rate range: Low: 50 To High: 70    Telemetry D/C'd at:     Additional Information:

## 2020-06-27 NOTE — Progress Notes (Addendum)
CCN Progress Note    Visit Information  Admission diagnosis: Seizure [facility])  Inpatient admission date: 06/23/2020    Primary Insurance Coverage  [employer]   Insurance Subscriber [place]  [id number]       06/27/20 Update:  Spoke to patients daughter Roanna Epley in regard discharge planning. Serena at this time is recovering from [place] and her husband is currently quarantined and sick with covid.  She will not have the ability to care for her mother 24/7 and when she and her husband return to work will not be able to provide 24/7 assist.  She states however her brother may be able to do so for a few weeks as he works from home.  The only caveat with the discharge to the patients sons home is, he was exposed to Covid on Friday 06/24/20 and will be getting tested on 1/18 or 06/29/20 preference will be to have brothers covid test results back first, should he agree to take her home.    Daughter states she is amendable for her mom to do a short SNF stay should she need it.  Home health referrals have been sent and are pending accept/denial, should she progress to home.     Discharge Plan   Anticipated Living Situation Post Discharge/Needs:  home  Barriers to Discharge:   pending medical readiness  Anticipated Discharge Date:  07/01/20, however this is an estimate and subject to change                Tenna Child RN CCN

## 2020-06-27 NOTE — Nursing Note (Signed)
Illness Severity  Stable      Patient Summary  75yo woman with h/o aortic stenosis, CAD s/p CABG, mild cognitive impairment, presenting with one day of confusion and possible aphasia. Found to have had a Right ischemic stroke on MRI.    A/Ox3-4, forgetful and confused at times but redirectable. Pt wears glasses, mildly HOH, some word finding and aphasia. Has poor attention span/concentration, and MAE. VSS, tele - SB in the high 40's to low 50's while sleeping. Continent bowel and bladder. Skin intact. SBA OOB. BA on. Denied pain. Verbalized she wants to go home.

## 2020-06-28 ENCOUNTER — Other Ambulatory Visit (HOSPITAL_BASED_OUTPATIENT_CLINIC_OR_DEPARTMENT_OTHER): Payer: Self-pay

## 2020-06-28 ENCOUNTER — Encounter (HOSPITAL_BASED_OUTPATIENT_CLINIC_OR_DEPARTMENT_OTHER): Payer: Self-pay | Admitting: Internal Medicine

## 2020-06-28 ENCOUNTER — Observation Stay (HOSPITAL_BASED_OUTPATIENT_CLINIC_OR_DEPARTMENT_OTHER)
Admit: 2020-06-28 | Discharge: 2020-06-28 | Disposition: A | Discharge status: Home / Self Care | Payer: Medicare HMO | Source: Home / Self Care

## 2020-06-28 DIAGNOSIS — I63511 Cerebral infarction due to unspecified occlusion or stenosis of right middle cerebral artery: Secondary | ICD-10-CM

## 2020-06-28 DIAGNOSIS — R001 Bradycardia, unspecified: Secondary | ICD-10-CM

## 2020-06-28 DIAGNOSIS — I639 Cerebral infarction, unspecified: Secondary | ICD-10-CM | POA: Diagnosis present

## 2020-06-28 LAB — EKG 12 LEAD
Atrial Rate: 61 {beats}/min
P Axis: 64 degrees
P-R Interval: 224 ms
Q-T Interval: 432 ms
QRS Duration: 90 ms
QTC Calculation: 434 ms
R Axis: 66 degrees
T Axis: 83 degrees
Ventricular Rate: 61 {beats}/min

## 2020-06-28 LAB — COVID-19 CORONAVIRUS QUALITATIVE PCR: COVID-19 Coronavirus Qual PCR Result: NOT DETECTED

## 2020-06-28 MED ORDER — CLOPIDOGREL BISULFATE 75 MG OR TABS
75.0000 mg | ORAL_TABLET | Freq: Every day | ORAL | 0 refills | Status: DC
Start: 2020-06-29 — End: 2021-04-06
  Filled 2020-06-28: qty 30, 30d supply, fill #0

## 2020-06-28 MED ORDER — CLOPIDOGREL BISULFATE 75 MG OR TABS
75.0000 mg | ORAL_TABLET | Freq: Every day | ORAL | Status: DC
Start: 2020-06-28 — End: 2020-06-28
  Administered 2020-06-28: 75 mg via ORAL
  Filled 2020-06-28: qty 1

## 2020-06-28 MED ORDER — PILL SPLITTER MISC
1.0000 | Freq: Once | Status: AC
Start: 2020-06-28 — End: 2020-06-28
  Administered 2020-06-28: 1
  Filled 2020-06-28: qty 1

## 2020-06-28 MED ORDER — METOPROLOL SUCCINATE ER 25 MG OR TB24
12.5000 mg | EXTENDED_RELEASE_TABLET | Freq: Every day | ORAL | 0 refills | Status: DC
Start: 2020-06-29 — End: 2020-07-29
  Filled 2020-06-28: qty 15, 30d supply, fill #0

## 2020-06-28 NOTE — Discharge Instr - Appointments (Signed)
The [facility] is located in the [facility] on the [place] for your appointment scheduled with [facility] on 08/18/20 and 10/06/20:    Please be ready to attend your visit using the "Begin Video Visit" button in your MyChart account. If you do not have MyChart, your providers assigned Zoom link will be sent by the clinic via text message prior to the appointment.    Your appointment will be a Telehealth visit. Please visit [email or web address] for more information.    If you are unable to attend a Telehealth visit and would like an in-person visit instead, please call us at [phone].

## 2020-06-28 NOTE — Progress Notes (Signed)
Telemetry Progress Note        Cardiac Rhythm Summary: Night shift and Abnormal PR: 0.22    Rhythm: Sinus rhythm (SR), Sinus bradycardia (SB), Sinus pause and 1st Degree AV Block    Rate exact: Low: 41 To High: 91    Events: 3 beats of Ventricular Ectopy, at a rate 166, around                19:07    Rhythm changes during shift at times: No    Patient refused telemetry: No

## 2020-06-28 NOTE — Progress Notes (Signed)
Progress Note     Wendy [name] [facility]") - DOB: 03-27-1946 75 year old female)  Gender Identity: Female  Preferred Pronouns: she/her/hers  Admit Date: 06/23/2020  Code Status: Full Code       CHIEF CONCERN / IDENTIFICATION:  Wendy [name] is a 75 year old right-handed F with history of CAD s/p CABG and migraine headaches with aura who presented to the ED after acute episode of confusion with dizziness 1/12, found to have R MCA ischemic stroke. Currenly stable pending dispo     SUBJECTIVE   INTERVAL HISTORY:  - NAEO. 3 beats of ventricular ectopy wit HR 166, asymptomatic  - Awaiting safe dispo, family has covid . Plan for son to get tested so she can gho back home with him.  - in the AM: no change in neuro exam.  - asked about cardiac history, she recall a stent last year and did no think she was on plavix  -     SCHEDULED MEDICATIONS:     acetaminophen, 1,000 mg, q6h [facility]    aspirin, 81 mg, Daily    atorvastatin, 80 mg, Daily    enoxaparin, 40 mg, q12h [facility]    metoprolol succinate ER, 12.5 mg, Daily    INFUSED MEDICATIONS:       PRN MEDICATIONS:  bisacodyl, 10 mg, Daily PRN    labetalol, 10-60 mg, PRN    polyethylene glycol 3350, 17 g, Daily PRN    senna, 8.6 mg, q HS PRN       OBJECTIVE     Vitals (Most recent in last 24 hrs)     T: 36 C (06/27/20 2341)  BP: (!) 128/56 (06/27/20 2341)  HR: (!) 56 (06/27/20 2341)  RR: 16 (06/27/20 2341)  SpO2: 98 % (06/27/20 2341) Room air  T range: Temp  Min: 36 C  Max: 36.5 C  Admit weight: (!) 101.5 kg (223 lb 12.3 oz) (06/23/20 2324)  Last weight: (!) 105 kg (231 lb 7.7 oz) (06/24/20 0024)       I&Os:     Intake/Output Summary (Last 24 hours) at 06/28/2020 0051  Last data filed at 06/27/2020 1000  Intake 360 ml   Output    Net 360 ml       Physical Exam  General: well appearing F, NAD, sitting up at side of bed  HEENT: mmm, no redness, swelling or trauma over R scalp where pain is present  Card: warm and well perfused extremities  Pulm: breathing  comfortably  Neuro: At times inattentive, easily distracted. Alert and able to converse fluently. Left quadrant field cut, lower>upper. Other CN symmetric. Moves all extremities briskly. Wide based gait.     Labs (last 24 hours):   Chemistries  CBC  LFT  Gases, other   - - - -   -   AST: - ALT: -  -/-/-/-   -/-/-/-   - - -   - >< -  AP: - T bili: -  Lact (a): - Lact (v): -   eGFR: - Ca: -   -   Prot: - Alb: -  Trop I: - D-dimer: -   Mg: - PO4: -  ANC: -     BNP: - Anti-Xa: -     ALC: -    INR: -      Imaging Results:  TransTHORACIC echo (TTE) complete  Conclusion  1. The left ventricle is normal in size. There is mild concentric increase  in  the wall thickness of the left ventricle. Global left ventricular function is  normal (biplane EF 65%). The left ventricular average longitudinal peak  systolic strain is borderline (-17%). No regional wall motion abnormalities  are present.  2. The right ventricle is normal size. The right ventricular systolic  function is normal.  3. The aortic valve is mildly calcified. There is mild to moderate valvular  aortic stenosis (Vmax 2.9 m/s, MG 20 mmHg, AVA 1.4 cm2). Mild to moderate  aortic regurgitation is present.  4. Mild mitral annular calcification is present. The mitral annular  calcification is posterior.  5. Agitated saline contrast study was negative at rest, with Valsalva, and  with cough.  6. Pulmonary artery systolic pressure could not be estimated due to an  insufficient tricuspid regurgitant jet.  7. There is no pericardial effusion.    Compared with the baseline TTE from 11/03/2019, no significant changes are  noted.       ASSESSMENT/PLAN      Wendy [name] is a 75 year old right-handed F with history of CAD s/p CABG and migraine headaches with aura who presented to the ED after acute episode of confusion with dizziness 1/12, found to have R MCA territory ischemic stroke, admitted for workup and pending safe dispo.    #Right parietal stroke  R MCA territory  stroke seen on MRI brain. Possible causes of ischemic stroke are cardioembolic (afib, post-MI, valvular disease, LV thrombus, PFO/ASA), large vessel disease (extracranial, intracranial, aortic arch), small vessel (lacunar) vs other causes (dissection, infection, hypercoagulable state, drug abuse). Among the possibilities listed above, patient's etiology thought to favor cardioembolic, given extensive cardiac history and mildly elevated BNP.  Deficits include L visual field cut, at times inattentive/distractable.     Ischemic stroke work-up:  - CT head non-contrast completed  - CTA head/neck completed  - Brain MRI stroke protocol completed  - TTE: No shunt, normal EF, mild to moderate aortic stenosis  - ECAT to mail home  - EKG sinus rhythm with first degree block  - HbA1c 5.7%, fasting lipid panel T 283, TG 168, HDL 46, LDL 203, BNP 174    Ischemic stroke plan:  - Telemetry  - Euglycemia: corrective insulin, BG goal < 180  - Goal normotension, currently meeting without intervention  - s/p ASA 338m PO  - continue ASA 863mdaily + Plavix 7571mindicated for her CAD s/p CABG)  - Atorvastatin 80 mg    # Dispo  PT/OT/SLP--> needs assistance with all IADLs such as cooking, new medications, setting up medisets, driving, errands. Patient's daughter reports that her husband and brother are COVID+ and cannot take her mom home today as originally planned. Patient is not safe to go to their home or back to her senior living facility without additional help. [facility].  - TBW daughter regarding any family member able to help with dispo  - Should be able to go back to her current home with family assistance and outpatient therapies vs placement with SW help.  - Home health face to face for therapies completed 1/15    #History of CABG  #Coronary artery disease  -Continue metoprolol, decreased to 12.5 mg given bradycardia, can inc to 25 if  tolerates  -continue ASA, Plavix and statin as above. (plavix was started again on 1/18).     #Bradycardia  Asymptomatic to 40s-50s when sleeping.   -titrate metoprolol  Hypertension  -normotensive, no meds needed    #c/f UTI  Leuk esterase +, nitrite -. U/a with 100k mixed flora. No UTI sx of dysuria, frequency, urgency or hematuria and CTX stopped.     Inpatient Checklist:    Prophylaxis: enoxaparin 40mg BID  Lines/Drains/Airways: PIV  Disposition: Acute care, pending safe dispo. Lives in independent senior housing ([facility]).  Code Status: Full Code    Contacts: Primary Emergency Contact:   MYERS, VAHIV, Home Phone: [phone] --> senior housing staff  Serene (daughter): [phone] updated 1/16

## 2020-06-28 NOTE — Progress Notes (Signed)
Telemetry Progress Note    Admitted to telemetry this shift at:      Cardiac Rhythm Summary: Day shift and Abnormal PR: 0.22    Rhythm: Sinus rhythm (SR) and Sinus bradycardia (SB)      Rate exact: Low: 40 To High: 90    Rate range: Low: 50 To High: 80    Occasional: PVC    Telemetry D/C'd at: 1021    Additional Information:

## 2020-06-28 NOTE — Procedure Nursing Note (Signed)
30 Day Event monitor placed, instructions given and patient's questions answered.

## 2020-06-28 NOTE — Nursing Note (Signed)
Illness Severity  Stable    Patient Summary  75yo woman with h/o aortic stenosis, CAD s/p CABG, mild cognitive impairment, presenting with one day of confusion and possible aphasia. Found to have had an ischemic stroke on MRI.      A/Ox4, NIHSS 1, PERRL - reports decreased peripheral vision - wears glasses, has a hard time focusing on close objects, reports waxing and waning presentation of "auras" that relate to her ongoing migraines, mildly HOH, clear speech - some word finding and aphasia present, makes needs known independently and when prompted, can be forgetful and confused at times but easily reoriented, F/C but poor attention span/concentration - needs repetition to perform tasks at times - unrelated vs related verbalizations, can be left alone for multiple hours and pt does just fine, MAE with good strength, sensation intact, VSS, 1+ pitting edema in BLE, RA, general diet with thin liquids, meds whole, cont bowel and bladder, + BT + flatus, voiding without issue, skin grossly intact, independent OOB, intermittent HA - tylenol given - reports good relief, d/c'd home in personal ride with son - given d/c paperwork and education - heart monitor applied

## 2020-06-28 NOTE — Progress Notes (Signed)
CCN DISCHARGE PLANNING NOTE    Expected Discharge Date:     Discharge Plan: CCN spoke with pt's son Wendy [name] to discuss discharge plan. He said he will take his Mom to his home for once week and will be able to care for her. CCN made home health referral to [facility] and they will be able to see her in one week.      Discharge Complexity Level         Primary Insurance Coverage  [facility]   Insurance Subscriber [place]  [id number]  Preferred Pharmacy for Discharge Meds:  [facility] [address] [place] [phone]        Continued Care and Services:          Discharge Transportation:     Discharge Transportation Plan: Private Ride, pt's son will come 4 PM to provide ride home.      Verified PCP:  Debroah Loop, MD    Bedside RN and/or charge RN updated with plan.    Scheduled Appointments:     Provider Department    07/21/2020 8:10 AM Debroah Loop [facility]           Appointments Outside of [facility] and those not yet scheduled  Contact information for follow-up     [facility]   Specialty: Gerontology    [address]  [place] [address]   Phone: [phone]       Next Steps: Follow up on 07/21/2020    Instructions: This is your post hospitalization follow up appointment with your Primary Care Provider Dr Ronalee Red. Please be available by phone at [place], the appointment is at 8:10am    [facility]   Specialty: Neurology    [address]  [place] [address]   Phone: [phone]       Next Steps: Follow up in 3 week(s)    Instructions: F/U in 3 weeks with NP and in 3 months with attending    [facility]   Specialty: Cardiology    [place] [address]   Phone: [phone]       Next Steps: Follow up    Instructions: please call this number to see if this test (event monitor) can be mailed to you    [facility]   Specialty: Cardiology     [place] [id number]   Phone: [phone]       Next Steps: Follow up    Instructions: Next available new patient appt. post discharge s/p R MCA stroke needs follow-up for mild to moderate aortic stenosis with regurgitation found on TTE.    [place] home [facility]    Phone: [phone]  Fax: [phone]             Next Steps: Follow up    Instructions: [place] home [facility] will contact you in one week for home therapy.

## 2020-06-28 NOTE — Nursing Note (Signed)
Illness Severity  Stable      Patient Summary  75yo woman with h/o aortic stenosis, CAD s/p CABG, mild cognitive impairment, presenting with one day of confusion and possible aphasia. Found to have had an ischemic stroke on MRI.     A/Ox4, PERRL, wears glasses, has a hard time focusing on close objects, mildly HOH, clear speech - some word finding and aphasia present, makes needs known independently and when prompted, can be forgetful and confused at times but easily reoriented, F/C but poor attention span/concentration - needs repetition to perform tasks at times, MAE with good strength, sensation intact, VSS, tele - SB/NSR with 1st degree AVB. Ccont bowel and bladder. Skin intact. Independent OOB. Pain under control with current regimen.

## 2020-06-29 ENCOUNTER — Telehealth (HOSPITAL_BASED_OUTPATIENT_CLINIC_OR_DEPARTMENT_OTHER): Payer: Self-pay

## 2020-06-29 ENCOUNTER — Telehealth (HOSPITAL_BASED_OUTPATIENT_CLINIC_OR_DEPARTMENT_OTHER): Payer: Self-pay | Admitting: Internal Medicine

## 2020-06-29 NOTE — Telephone Encounter (Signed)
Checked with clinical pharmacist to see if she had tried to contact the pt but was informed she did not. This writer just left VM with pt a few minutes ago about scheduling a sooner appt w/ PCP. If pt calls back will let her know it was not clinic that reached out to her but will check what med she has questions about. Pt scheduled for TCM med recon with clinical pharmacist tmr.

## 2020-06-29 NOTE — Telephone Encounter (Signed)
LVM offering sooner phone visit w/ PCP for TCM. Direct CB# provided.

## 2020-06-29 NOTE — Telephone Encounter (Signed)
RETURN CALL: Voicemail - Detailed Message      SUBJECT:  Appointment Request     REASON FOR VISIT: Discharge from [place]: 1/19 at 9 am  ADDITIONAL INFORMATION: Requesting phone visit per discharge. Please assist.

## 2020-06-29 NOTE — Telephone Encounter (Signed)
RETURN CALL: Voicemail - Detailed Message      SUBJECT:  General Message     MESSAGE: Patient returning call and would also like to discuss medication.

## 2020-06-30 ENCOUNTER — Ambulatory Visit: Payer: Medicare HMO | Attending: Ambulatory Care | Admitting: Ambulatory Care

## 2020-06-30 ENCOUNTER — Other Ambulatory Visit (HOSPITAL_BASED_OUTPATIENT_CLINIC_OR_DEPARTMENT_OTHER): Payer: Self-pay | Admitting: Internal Medicine

## 2020-06-30 ENCOUNTER — Encounter (HOSPITAL_BASED_OUTPATIENT_CLINIC_OR_DEPARTMENT_OTHER): Payer: Self-pay | Admitting: Internal Medicine

## 2020-06-30 DIAGNOSIS — I63511 Cerebral infarction due to unspecified occlusion or stenosis of right middle cerebral artery: Secondary | ICD-10-CM

## 2020-06-30 DIAGNOSIS — Z79899 Other long term (current) drug therapy: Secondary | ICD-10-CM

## 2020-06-30 NOTE — Telephone Encounter (Signed)
Called patient and scheduled a sooner hospital f/u with PCP on 1/27 at 10:30am via telemedicine. Pt was very appreciative of the phone call she had with the clinical pharmacist. She found it very helpful to know what each of her medications were for and this encouraged her to commit to taking her meds regularly. She also wanted to let PCP know that she's been having pain at her hip at what feels like the tip of the femur. Pt notes that there is pain when she is standing and especially when walking. She was able to walk outside a little today but did feel a bit short of breath. She felt quite energized after the walk though. She is also having some pain on her spine about 12 inches from the top of her spine. Her uncle had ankylosing spondylitis and wonders if she might have that too. Per pt, she was told that her last DEXA scan looked good.    Patient also wanted to express her gratitude towards the inpatient staff, especially [facility](sp?) and Abrhat(sp?).

## 2020-06-30 NOTE — Progress Notes (Signed)
Distant Site Telemedicine Encounter    I conducted this encounter from [facility] via secure, live, face-to-face video conference with the patient. Wendy [name] was located at home with son.  I reviewed the risks and benefits of telemedicine as pertinent to this visit and the patient agreed to proceed.    Post-Discharge Communication within 2 business days    Hospital Name:  [facility] Admission date:  06/23/20  Hospital Discharge date:  06/28/20  Reason for Hospitalization:  R MCA ischemic stroke  Discharge Provider:  Raylene Everts, MD    Contact within two business days of discharge date:  YES  Contact type:  Telehealth via Zoom   Scheduled patient for 7th day (high complexity condition such as stroke or MI) or 14th day office visit with provider:  NO .    Appointment Date:  07/21/20 with Dr. Ronalee Red    Medications on discharge from hospital:  yes        Taking medications as prescribed:  NO  Any side effects of medication reported:  NO    Patient presents with son, having been recently discharged. Has questions on medications, side effects, and their indications. Is concerned that lisinopril is a blood thinner and that aspirin may harm kidneys, states she's had kidney stones in the past. Has also previous intolerances to statins, including Crestor.     Is currently NOT taking aspirin 81 mg, atorvastatin 80 mg, and amlodipine 10 mg since discharge. Does have amlodipine 10 mg, but is currently residing with son. Amlodipine is at her house, which is not too far from son's residence.     Has a pillbox that allows for 4 times daily meds which she previously was managing by herself. Patient lives independently. States she organizes her medications as such    AM - clopidogrel 75 mg, aspirin 81 mg (has not taken yet)  Noon (will take at 2pm) - lisinopril 5 mg  PM - amlodipine 10 mg (has not started since discharge) and metoprolol succ 25 mg 1/2 tab (12.5 mg)    Throughout the visit, patient's son notes  that patient also has a medication for GERD in pillbox, which patient denies needing (likely pantoprazole or Prilosec?). Patient also has tabs for met succ 50 mg in pillbox, as confirmed on pill identifier. Pt and pt's son inquires on where to dispose of old medications. Discharge medication list reviewed with both. Confirmed that patient is not taking K supplements and spironolactone.     Patient is very involved with med managing and seeks to understand why she needs to be on each medication. States someone had persuaded her to take lisinopril as a blood thinner in the past, which she has committed to. Inquires why she needs the other cardiac medications.     Patient states she feels great; however, son notes that she was out of breath after walking 1/2 block the other day. Patient is not checking BP at home, son states that can be easily purchased outside. Patient denies chest discomfort, shortness of breath, and dizziness.     DISCHARGE MEDICATIONS:         Current Discharge Medication List            START taking these medications    Details   atorvastatin 80 MG tablet Take 1 tablet (80 mg) by mouth daily.  Qty: 90 tablet, Refills: 0               CONTINUE these medications which  have CHANGED    Details   clopidogrel 75 MG tablet Take 1 tablet (75 mg) by mouth daily.  Qty: 30 tablet, Refills: 0      metoprolol succinate ER 25 MG 24 hr tablet Take 0.5 tablets (12.5 mg) by mouth daily. Do not chew or crush.  Qty: 15 tablet, Refills: 0               CONTINUE these medications which have NOT CHANGED    Details   amLODIPine 10 MG tablet Take 1 tablet (10 mg) by mouth daily.  Qty: 90 tablet, Refills: 2    Associated Diagnoses: Essential hypertension      aspirin 81 MG EC tablet Take 81 mg by mouth daily.      ketoconazole 2 % shampoo Apply topically 2 times a week. Apply to scalp twice weekly. Let sit in scalp 5 minutes before rinsing out.  Qty: 120 mL, Refills: 2    Associated Diagnoses: Seborrheic  dermatitis      lisinopril 5 MG tablet Take 1 tablet (5 mg) by mouth daily. For heart and blood pressure  Qty: 90 tablet, Refills: 3    Associated Diagnoses: Coronary artery disease involving native coronary artery of native heart without angina pectoris              STOP taking these medications       fexofenadine 180 MG tablet Comments:   Reason for Stopping:         nitroGLYCERIN 0.4 MG SL tablet Comments:   Reason for Stopping:         potassium citrate ER 10 MEQ (1080 MG) ER tablet Comments:   Reason for Stopping:         spironolactone 25 MG tablet Comments:   Reason for Stopping:             A/P:    #TCM: Suboptimal adherence with amlodipine and statin. Discussed partnership of DAPT (aspirin and clopidogrel) with duration of therapy for 1 year as indicated in Dr. Lianne Moris note 03/03/20. Clarified with patient that lisinopril is blood pressure reducer and not blood thinner. Reviewed indication of each cardiac medication with patient and patient's son. Clarified scheduling of medications and how to organize pillbox moving forward. Assured patient that kidneys are stable currently, will continue to monitor.      Discussed statin alternatives and that DAPT is not an alternative to statins. Patient interested in [place], I informed them that [place] may not be best alternative at this time, encouraged patient to see lipid specialist.    Clarified with patient that she should be taking one 1/2 tab of met succ 25 mg daily and not two 1/2 tabs met succ 25 mg daily.     --Start taking aspirin 81 mg/day and amlodipine 10 mg/day with other cardiac medications  --Continue lisinopril 5 mg, metoprolol succinate 12.5 mg, and clopidogrel 75 mg daily   --Will message Dr. Ronalee Red regarding statin and referral to lipid clinic    Patient reports he/she is able to care for self:  YES. Lives independently, near son. Son will be assisting with med management this upcoming week.  Patient reports concerns:   YES: had questions regarding medications. .     Patient advised to call the clinic if any concerns noted prior to follow-up appointment with provider.    I spent a total of 60 minutes for the patient's care on the date of the service.    Sonia Baller  Mariann Laster, PharmD  Contact made by: clinic pharmacist

## 2020-07-01 NOTE — Telephone Encounter (Signed)
MyChart reply sent to son. Scheduled to see me 1/27. May need earlier in-person appointment for neck and back pain.    Home health pending I believe.    Outpatient SLP recommended for cognitive concerns.

## 2020-07-05 NOTE — Progress Notes (Signed)
[facility]  Transitional Care Management Provider Note      Distant Site Telemedicine Encounter    I conducted this encounter from [facility] via secure, live, face-to-face video conference with the patient. Wendy [name] was located at home in [place] state with son Wendy [name].  I reviewed the risks and benefits of telemedicine as pertinent to this visit and the patient agreed to proceed.    Hospital or facility name: [facility]  Date of admission: 06/23/20   Date of discharge: 06/28/20  Patient received post-discharge communication within 2 business days:  [x] YES  [] NO  Reason for hospitalization and other complicating diagnoses:   R MCA ischemic stroke, unclear etiology    Interval history since hospitalization and pertinent history from hospitalization:     Stayed with son initially after hospital discharge. Now back at home since felt this would reduce her anxiety.   One day felt nauseous. BP a little lower than previously during that time.    [name] feels she is recovering, getting help. Home health has started services. Pending PT evaluation. RN, OT, SLP have seen her so far. Son asks about next steps. Decreased head, sinus and ear pains and sinus drainage.    Hypertension concerns: recommended BP range and what to do if misses medication doses. Prefers to take all medications at bedtime.    Doesn't exercise much. Doesn't exercise much. Can only walk in hallway before getting winded. Difficulty with stairs. Previously completed cardiac rehab in October. Does tai chi but otherwise no regular exercise since then. Family would like her to exercise more.    Some difficulty with near vision. Home health recommended neuro-ophthalmology referral. Also would like to update eyeglasses prescription if needed.    [name] also reporting:  - Neck pain, mostly R-sided back of head. Thinks worse when gets up in morning. Sleeps on incline pillow for GERD and also uses air pillow to help  keep head more upright. Massage helps.  - Spine at shoulder blade level. Thinks may be related to movement. Massage helps.  - L ankle pain in front of ankle  - Blood on toilet paper occasionally small amounts on occasion, rare few drops blood in toilet. Reports "good" soft formed BMs, sometimes pain when has BMs. Passes gas regularly.    Records reviewed for this visit:   [x] Hospital Admit Note   [x] Hospital Discharge Note   [] Nursing home records    [x] Laboratory reviewed   [x] Imaging reviewed     [] Consultations reviewed     Home health agency?  [x] YES  [] NO    Medication reconciliation done as required.   [x]     Physical Examination  There were no vitals taken for this visit.  General: pleasant older female, in no acute distress  Respiratory: normal work of breathing  NEURO: alert, some word-finding difficulty at times. Less confused than at 1/13 visit. Able to stand up without assistance and show me where has neck and back pain. Moving all extremities.    06/23/20 brain MRI "PRELIMINARY FINDINGS:  HEAD MR:   MASS EFFECT & VENTRICLES: No shift. The lateral ventricles are symmetric. The ventricles, sulci and cisterns are normal.  BRAIN:  Mild cerebral volume loss seen.  Numerous numerous punctate FLAIR hyperintensities in the periventricular and subcortical white matter consistent with microvascular ischemic change.  Diffuse T2 and FLAIR hyperintensity is noted in the right temporal lobe with evidence of restricted diffusivity on DWI. No hypointensities encountered on SWI to suggest  hemorrhagic transformation.    VASCULAR:  Cavernous carotid, vertebral, and other intracranial vascular flow voids are normal  EXTRA-AXIAL:  Extra-axial spaces are normal.   EXTRA-CRANIAL:   Skull and facial bones are normal.  Minimal mucosal thickening in bilateral maxillary sinuses.   Bilateral mastoids are clear. Orbits are normal.     IMPRESSION  1.  Acute right MCA territory infarct. No evidence of hemorrhagic  transformation.    This is a preliminary report dictated by Dr..Alcacoas(Fellow,ERad). Unless a final report appears below these images have not been reviewed by an attending radiologist.     ATTENDING FINAL REPORT  FINDINGS/IMPRESSION (final):  I agree with the reported preliminary findings and impression, as above described (Cat 1).   I have personally reviewed the images and agree with the report (or as edited)."    06/25/20 TTE LVEF 65%  Mild-moderate AS  Mild-moderate AR  Mild mitral annular calcification  Agitated saline contrast study negative     Event monitor: report pending    Assessment and Post-Discharge Plans      1. Acute ischemic cerebrovascular accident (CVA) involving right middle cerebral artery territory ([place]): 06/22/20 with hospitalization 1/13-1/18/22  - ASA 81 mg daily + clopidogrel 75 mg daily  - BP control goal <130/80  - Pending lipid clinic referral due to statin intolerance  - Pending cardiac monitor report  - Pending carotid duplex U/S next week  - Continue home health PT/OT/SLP  - Referral to [facility]; Future neuro-ophthalmology     2. Essential hypertension: difficulty with medication compliance  - Okay to take all medications together at bedtime  - Will try to keep things simple currently so if misses dose okay to wait until next dose due, though ideally would take when remember missed dose. Family working with her for medication compliance.  - Continue amlodipine, lisinopril, metoprolol ER    3. 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 Aortic stenosis, mild  5. Aortic valve insufficiency, etiology of cardiac valve disease unspecified  - Will arrange for cardiology follow up    6. Mixed hyperlipidemia: statin intolerance  - Lipid clinic referral pending    7. Neck pain: sounds like MSK etiology  - Will request RN to give verbal order to home health PT to evaluate    8. Midline thoracic back pain, unspecified chronicity  -  Will request RN to give verbal order to home health PT to evaluate    9. Left ankle pain, unspecified chronicity  - XR Ankle 3+ Vw Left; Future when comes in for U/S next week    10. Rectal bleeding: small amount, okay to observe for now; reports soft BMs  - Rectal exam at next in-person appointment  - Referred for colonoscopy last year due to history of colon polyps, okay defer due to current recovery from CVA and COVID-19 pandemic (omicron variant case surge)    11. Vision problem: near vision, home health requesting neuro-ophthalmology referral  - Referral to [facility]; Future will check if also possible to update vision rx at appointment     12. Non-functioning R kidney  - Pending U/S next week okay to consolidate appointments    13. Statin intolerance: pending lipid clinic referral    14. Unintentional underdosing of medication regimen by patient due to age-related debility  - Need to keep medication regimen simple and preferably once daily dosing     Return to clinic/Plan for next  check-in: Return in about 1 month (around 08/07/2020) for routine follow-up.      Visit time: 2010  End time: 0712              07/14/2020 8:00 AM (Arrive by 7:45 AM) [facility] Ultrasound    07/15/2020 10:45 AM (Arrive by 10:30 AM) [facility]2 [facility] Ultrasound    08/18/2020 11:00 AM (Arrive by 10:45 AM) Luvenia Starch, [facility]    10/06/2020 2:00 PM (Arrive by 1:45 PM) Raylene Everts, MD [facility]

## 2020-07-06 ENCOUNTER — Telehealth (HOSPITAL_BASED_OUTPATIENT_CLINIC_OR_DEPARTMENT_OTHER): Payer: Self-pay | Admitting: Internal Medicine

## 2020-07-06 NOTE — Telephone Encounter (Signed)
Requesting verbal orders for:  Nursing 1-3 times a wk for 4 wks.  Social Work 2-4 times a m/o for 2 m/o.  Speech 1-3 times a wk for 8 wks.

## 2020-07-07 ENCOUNTER — Ambulatory Visit: Payer: Medicare HMO | Attending: Internal Medicine | Admitting: Internal Medicine

## 2020-07-07 ENCOUNTER — Telehealth (HOSPITAL_BASED_OUTPATIENT_CLINIC_OR_DEPARTMENT_OTHER): Payer: Self-pay | Admitting: Internal Medicine

## 2020-07-07 DIAGNOSIS — R0609 Other forms of dyspnea: Secondary | ICD-10-CM

## 2020-07-07 DIAGNOSIS — R0600 Dyspnea, unspecified: Secondary | ICD-10-CM | POA: Insufficient documentation

## 2020-07-07 DIAGNOSIS — M546 Pain in thoracic spine: Secondary | ICD-10-CM | POA: Insufficient documentation

## 2020-07-07 DIAGNOSIS — I350 Nonrheumatic aortic (valve) stenosis: Secondary | ICD-10-CM | POA: Insufficient documentation

## 2020-07-07 DIAGNOSIS — Z91130 Patient's unintentional underdosing of medication regimen due to age-related debility: Secondary | ICD-10-CM | POA: Insufficient documentation

## 2020-07-07 DIAGNOSIS — M542 Cervicalgia: Secondary | ICD-10-CM | POA: Insufficient documentation

## 2020-07-07 DIAGNOSIS — I63511 Cerebral infarction due to unspecified occlusion or stenosis of right middle cerebral artery: Secondary | ICD-10-CM | POA: Insufficient documentation

## 2020-07-07 DIAGNOSIS — Z789 Other specified health status: Secondary | ICD-10-CM | POA: Insufficient documentation

## 2020-07-07 DIAGNOSIS — I10 Essential (primary) hypertension: Secondary | ICD-10-CM | POA: Insufficient documentation

## 2020-07-07 DIAGNOSIS — K625 Hemorrhage of anus and rectum: Secondary | ICD-10-CM | POA: Insufficient documentation

## 2020-07-07 DIAGNOSIS — M25572 Pain in left ankle and joints of left foot: Secondary | ICD-10-CM | POA: Insufficient documentation

## 2020-07-07 DIAGNOSIS — I351 Nonrheumatic aortic (valve) insufficiency: Secondary | ICD-10-CM | POA: Insufficient documentation

## 2020-07-07 DIAGNOSIS — N289 Disorder of kidney and ureter, unspecified: Secondary | ICD-10-CM | POA: Insufficient documentation

## 2020-07-07 DIAGNOSIS — E782 Mixed hyperlipidemia: Secondary | ICD-10-CM | POA: Insufficient documentation

## 2020-07-07 DIAGNOSIS — H547 Unspecified visual loss: Secondary | ICD-10-CM | POA: Insufficient documentation

## 2020-07-07 NOTE — Progress Notes (Signed)
The clinical staff  checked-in the patient, 07/07/20 at 10:27 AM, and was able to go over the [place] disclaimer. The patient is aware of the possible risk and benefits of the TeleMedicine visit.    The clinical staff  was able to review and document the following:     Patient's Primary Chief Complaint/Reason for visit  Allergies  Medication review   Preferred Pharmacy  Screening Questionnaire: PHQ2, Safety/Falls Questionnaire  Vital Signs    Patient reports the following:     New onset fever No    New onset cough Yes    New onset of Shortness of breath Yes    Immunosuppression No    Afraid of falling No    Feeling unsteady when walking No    Any Vital Signs completed by Patient/Caregiver? N/A  If Yes, please document in Vital Signs per [facility].     Any other concerns from patient or family:   Patient is complaining about L ankle hurting while walking. Started 07/06/20

## 2020-07-07 NOTE — Telephone Encounter (Signed)
Detailed vm messsage left Prov HH order line @ [phone]  Verbal orders given for:  Skilled Nursing for disease control 1-3 times a week for 4 weeks  Social Work 2-4 times a month for 2 months for caregiver resource.    Speech 1-3 times a week for 8 wks.

## 2020-07-07 NOTE — Telephone Encounter (Signed)
RETURN CALL: Voicemail - Detailed Message      SUBJECT:  Orders     ORDERS FOR:   Skilled Nursing for disease control 1-3 times a week for 4 weeks  Social Work 2-4 times a month for 2 months for caregiver resource.    Speech 1-3 times a week for 8 wks.    VERBAL OR FAXED ORDER REQUEST: Verbal    ADDITIONAL INFORMATION: n\a

## 2020-07-07 NOTE — Telephone Encounter (Signed)
S/O: Additional home health orders for neck and thoracic back pain.    A/P: Neck pain sometimes R-sided, thoracic back pain    [facility] RN please give home health verbal orders for PT evaluation of neck pain and thoracic back pain.    Thanks.

## 2020-07-07 NOTE — Patient Instructions (Addendum)
Thank you for choosing [facility] for your care. You saw Ether Griffins, MD, MPH today at [facility]. Please call [phone] option #2 with any urgent questions or concerns.    Recent stroke:   Home health physical therapy, occupational therapy, speech therapy   Carotid artery ultrasound next week -- can call [phone] to schedule for same day as kidney ultrasound   Blood pressure goal less than 130/80 (both numbers)   Continue aspirin + clopidogrel   Continue blood pressure medications every day   Lipid (cholesterol) clinic referral -- looks like they are trying to schedule appointment   Neuro-ophthalmology referral ordered   Stroke neurology clinic appointments in March and April    High blood pressure:   Okay to take all medications at bedtime   Will keep it simple right now. If you miss a dose then wait until next evening.    Breathing:   Work with home health physical therapy   Will also arrange for follow up with your cardiologist Dr Bridgett Larsson    Neck and back pain: will ask home health physical therapy to check    Left ankle pain: x-ray ordered, can be done when you come in for ultrasounds next week    Bleeding with bowel movements: okay to watch unless worsens   Plan check for hemorrhoids when next seen in-person   Colonoscopy referral ordered last year, okay to defer given recent stroke + COVID-19 pandemic

## 2020-07-08 ENCOUNTER — Telehealth (HOSPITAL_BASED_OUTPATIENT_CLINIC_OR_DEPARTMENT_OTHER): Payer: Self-pay | Admitting: Cardiovascular Disease

## 2020-07-08 NOTE — Telephone Encounter (Signed)
Detailed vm message left Prov HH orders line @ [phone]  PT add on orders for neck and thoracic back pain    Provided CB#

## 2020-07-08 NOTE — Telephone Encounter (Signed)
RETURN CALL: Voicemail - Detailed Message      SUBJECT:  Appointment Request     REASON FOR VISIT: cardiology appointment with Dr Delight Ovens DATE/TIME: n/a  ADDITIONAL INFORMATION: Patient son states that he received a call about 3 hours ago stating that he needed to schedule the patient for an appointment with Dr Bridgett Larsson for Cardiology. CCR attempted to reach out to clinic to clarify if he was to be scheduling a return appointment with Dr Bridgett Larsson, or scheduling per referral to [facility], but clinic was with patients at time of call.

## 2020-07-08 NOTE — Telephone Encounter (Signed)
[facility] PC: please let me know when son's FMLA form received, thanks.

## 2020-07-08 NOTE — Telephone Encounter (Signed)
Called son back and schedule appt w/ Dr. Bridgett Larsson on Feb 17 at 3pm. Referral redirected.

## 2020-07-11 ENCOUNTER — Telehealth (HOSPITAL_BASED_OUTPATIENT_CLINIC_OR_DEPARTMENT_OTHER): Payer: Self-pay | Admitting: Nurse Practitioner

## 2020-07-11 ENCOUNTER — Encounter (HOSPITAL_BASED_OUTPATIENT_CLINIC_OR_DEPARTMENT_OTHER): Payer: Self-pay | Admitting: Internal Medicine

## 2020-07-11 DIAGNOSIS — I480 Paroxysmal atrial fibrillation: Secondary | ICD-10-CM | POA: Insufficient documentation

## 2020-07-11 DIAGNOSIS — I63511 Cerebral infarction due to unspecified occlusion or stenosis of right middle cerebral artery: Secondary | ICD-10-CM

## 2020-07-11 NOTE — Telephone Encounter (Signed)
Per cardiology when starts DOAC then stop aspirin.    Clopidogrel to continue through 11/2020.

## 2020-07-11 NOTE — Telephone Encounter (Signed)
S/O: Informed by stroke clinic about paroxysmal atrial fibrillation episode which was likely etiology for recent CVA. Currently on clopidogrel + ASA s/p CVA.    A/P: Paroxysmal atrial fibrillation with elevated CHA2DS2-VaSC score recommended anticoagulation.  - Messaged cardiology re: preference for DAPT with anticoagulation  - Request clinical pharmacist input on anticoagulation w/DOAC, stroke clinic recommends apixaban  - MyChart message to Wendy [name] if message not reviewed will have RN contact    Thanks.

## 2020-07-11 NOTE — Telephone Encounter (Signed)
Call attempted to reach [facility] and update her on alerts from her cardiac monitor revealing Afib. Call went directly to non-personal voicemail. No message left, will try again later. Epic message sent to pt's PCP in [facility] to update them of Afib findings and recommendations for [facility] with preference for Apixaban, and stopping DAPT if PCP and Cards agree; ask that they initiate and manage.

## 2020-07-12 ENCOUNTER — Telehealth (HOSPITAL_BASED_OUTPATIENT_CLINIC_OR_DEPARTMENT_OTHER): Payer: Self-pay | Admitting: Pharmacist

## 2020-07-12 MED ORDER — APIXABAN 5 MG OR TABS
5.0000 mg | ORAL_TABLET | Freq: Two times a day (BID) | ORAL | 3 refills | Status: DC
Start: 2020-07-12 — End: 2021-04-12

## 2020-07-12 NOTE — Assessment & Plan Note (Signed)
Apixaban 5 mg twice daily, stop ASA when starts apixaban  Continue clopidogrel through 11/2020  Pending lipid clinic appointment re: statin

## 2020-07-12 NOTE — Telephone Encounter (Signed)
Anticoagulation Initiation    75 yo pt with recent hospital admission for ischemic cerebrovascular accident (CVA) involving right middle cerebral artery territory  thought to be 2/2 afib. PCP referred pt for DOAC choice and intiation.  Currently on ASA 81 mg daily + clopidogrel 75 mg daily.   Cardiology states that ASA can be stopped once DOAC started. Clopidogrel to be continued through 11/2020.  CHADS2Vasc score: 5, HASBLEED score: 4  - BP control goal <130/80  Prefers to take medications at night.       Labs:    Results for orders placed or performed during the hospital encounter of 06/23/20   Comprehensive Metabolic Panel   Result Value Ref Range    Sodium 136 135 - 145 meq/L    Potassium 3.7 3.6 - 5.2 meq/L    Chloride 101 98 - 108 meq/L    Carbon Dioxide, Total 27 22 - 32 meq/L    Anion Gap 8 4 - 12    Glucose 88 62 - 125 mg/dL    Urea Nitrogen 14 8 - 21 mg/dL    Creatinine 1.07 (H) 0.38 - 1.02 mg/dL    Protein (Total) 7.3 6.0 - 8.2 g/dL    Albumin 4.4 3.5 - 5.2 g/dL    Bilirubin (Total) 0.8 0.2 - 1.3 mg/dL    Calcium 9.3 8.9 - 10.2 mg/dL    AST (GOT) 15 9 - 38 U/L    Alkaline Phosphatase (Total) 65 38 - 172 U/L    ALT (GPT) 21 7 - 33 U/L    eGFR, Calculated 51 (L) >59 mL/min/[1.73_m2]    GFR, Information       Calculated GFR by CKD-EPI equation. Inaccurate with changing renal function. See [email or web address].labmed.https://wilkins.[email or web address]           A/P:  Afib: currently rate controlled on BB. Appropriate DOAC would be apixaban 104m daily ( 73, SCr 1.07, wt 105kg).  -Plan to start this after review by PCP.  -Will stop ASA on initiation of DOAC, clopidogrel to continue until 11/2020.  Pt at higher risk of bleed given concurrent antiplatelet, age and htn- could consider PPI for GI protection.    Forwarding to PCP for review. Will plan to f/u with pt/family.    F/U[facility]RN [facility], 07/28/20 MSt. PaulsPharmD BCPS

## 2020-07-12 NOTE — Telephone Encounter (Signed)
S/O: Initiating DOAC for paroxysmal atrial fibrillation with recent R MCA territory CVA, now thought to be cardioembolic source.    Appreciate clinical pharmacist input on apixaban dosing. Also consider PPI for GI bleed prophylaxis    A/P: Need for anticoagulation for secondary stroke prevention due to paroxysmal atrial fibrillation and recent MCA territory CVA.    - Apixaban 5 mg twice daily rx to pharmacy on file  - MyChart instructions sent (son is accessing MyChart and helping check rx compliance). Stop aspirin when starts apixaban. Clopidogrel through 11/2020 per cardiology.  - If Wendy [name] agrees will also start PPI for GI bleed prophylaxis    Thanks.

## 2020-07-13 ENCOUNTER — Telehealth (HOSPITAL_BASED_OUTPATIENT_CLINIC_OR_DEPARTMENT_OTHER): Payer: Self-pay | Admitting: Nurse Practitioner

## 2020-07-13 NOTE — Telephone Encounter (Signed)
Calls attempted to reach pt and her daughter to update them on the Afib findings are cardiac event monitor but no answer and no messages left at this time. Records note that PCP is aware of findings and reaching out to pt to discuss treatment.

## 2020-07-14 ENCOUNTER — Ambulatory Visit: Payer: Medicare HMO | Attending: Pharmacist | Admitting: Pharmacist

## 2020-07-14 ENCOUNTER — Ambulatory Visit (HOSPITAL_COMMUNITY): Payer: Medicare HMO

## 2020-07-14 DIAGNOSIS — I480 Paroxysmal atrial fibrillation: Secondary | ICD-10-CM

## 2020-07-14 DIAGNOSIS — I10 Essential (primary) hypertension: Secondary | ICD-10-CM | POA: Insufficient documentation

## 2020-07-14 DIAGNOSIS — I63511 Cerebral infarction due to unspecified occlusion or stenosis of right middle cerebral artery: Secondary | ICD-10-CM | POA: Insufficient documentation

## 2020-07-14 NOTE — Telephone Encounter (Signed)
Vahiv called back and we scheduled an appt for pt to see ARNP on Mon 07/18/20 at 1:30pm.

## 2020-07-14 NOTE — Telephone Encounter (Signed)
[facility] PC: please contact son for in-person appointment with available provider for bleeding, thanks.

## 2020-07-14 NOTE — Telephone Encounter (Signed)
Son will drop off FMLA form for completion tomorrow 2/4 when Ms. Stallone has other ultrasound appointments. I can complete form if scanned into Media.    Thanks.

## 2020-07-14 NOTE — Telephone Encounter (Signed)
LVM with son Wendy [name] requesting call back to discuss appt options. Direct CB# provided.

## 2020-07-14 NOTE — Progress Notes (Signed)
Anticoagulation Initiation    75 yo pt with recent hospital admission for ischemic cerebrovascular accident (CVA) involving right middle cerebral artery territory  thought to be 2/2 afib. PCP referred pt for DOAC choice and intiation.  Today confirmed with pt and son that  ASA 81 mg daily was stopped, apixaban 66m BID started last night and clopidogrel 75 mg daily continued.   Pt reports blood on sheets this am and she had some blood on toilet paper after wiping. Son sent pictures for PCP to review.   Otherwise pt feeling ok. No dizziness, CP, SOB or falls.  States that she had a rx to a statin in the past  (ear ache with subsequent foggy thinking) so not willing to start prescribed atorvastatin.   Prefers to take medications at night.     Labs:  Results for orders placed or performed during the hospital encounter of 06/23/20   Comprehensive Metabolic Panel   Result Value Ref Range    Sodium 136 135 - 145 meq/L    Potassium 3.7 3.6 - 5.2 meq/L    Chloride 101 98 - 108 meq/L    Carbon Dioxide, Total 27 22 - 32 meq/L    Anion Gap 8 4 - 12    Glucose 88 62 - 125 mg/dL    Urea Nitrogen 14 8 - 21 mg/dL    Creatinine 1.07 (H) 0.38 - 1.02 mg/dL    Protein (Total) 7.3 6.0 - 8.2 g/dL    Albumin 4.4 3.5 - 5.2 g/dL    Bilirubin (Total) 0.8 0.2 - 1.3 mg/dL    Calcium 9.3 8.9 - 10.2 mg/dL    AST (GOT) 15 9 - 38 U/L    Alkaline Phosphatase (Total) 65 38 - 172 U/L    ALT (GPT) 21 7 - 33 U/L    eGFR, Calculated 51 (L) >59 mL/min/[1.73_m2]    GFR, Information       Calculated GFR by CKD-EPI equation. Inaccurate with changing renal function. See https://testguide.labmed.uhttps://wilkins.com/        A/P:  Afib/ s/p CVA: currently rate controlled on BB. Apixaban 564mBID ( 74, SCr 1.07, wt 105kg) started. Did have a bleeding incident last night apparently unprovoked. CHADS2Vasc score: 5, HASBLEED score: 4  Stopped ASA yesterday.  Clopidogrel to continue until 11/2020 per Cards.  -- reviewed MOA, benefit/risk with current regimen.  Pt at higher  risk of bleed given concurrent antiplatelet, age and htn- could consider PPI for GI protection.  -- Statin indicated s/p CVA and lipid panel elevated- pt will talk with Dr ChBridgett Larssont cardiology in 2 weeks.   - BP control goal <130/80- clinic number within goal.    [facility]20 M OasisharmD [facility]PS     This visit is being conducted over the telephone at the patients request: Yes  Patient gives verbal consent to proceed and knows there may be a copay/deductible: No deductible    Time spent on this telephone visit: 25 minutes    Given the importance of social distancing and other strategies recommended to reduce the risk of COVID-19 transmission, I am providing medical care to this patient via a telephone visit in place of an in person visit at the request of the patient.

## 2020-07-15 ENCOUNTER — Ambulatory Visit (HOSPITAL_BASED_OUTPATIENT_CLINIC_OR_DEPARTMENT_OTHER)
Admit: 2020-07-15 | Discharge: 2020-07-15 | Disposition: A | Discharge status: Home / Self Care | Payer: Medicare HMO | Source: Home / Self Care

## 2020-07-15 ENCOUNTER — Telehealth (HOSPITAL_BASED_OUTPATIENT_CLINIC_OR_DEPARTMENT_OTHER): Payer: Self-pay | Admitting: Internal Medicine

## 2020-07-15 ENCOUNTER — Emergency Department
Admission: EM | Admit: 2020-07-15 | Discharge: 2020-07-15 | Disposition: A | Discharge status: Home / Self Care | Payer: Medicare HMO | Attending: Emergency Medicine | Admitting: Emergency Medicine

## 2020-07-15 ENCOUNTER — Other Ambulatory Visit: Payer: Self-pay

## 2020-07-15 ENCOUNTER — Ambulatory Visit (EMERGENCY_DEPARTMENT_HOSPITAL)
Admit: 2020-07-15 | Discharge: 2020-07-15 | Disposition: A | Discharge status: Home / Self Care | Payer: Medicare HMO | Source: Home / Self Care

## 2020-07-15 ENCOUNTER — Telehealth (HOSPITAL_BASED_OUTPATIENT_CLINIC_OR_DEPARTMENT_OTHER): Payer: Self-pay | Admitting: Nursing

## 2020-07-15 ENCOUNTER — Emergency Department (EMERGENCY_DEPARTMENT_HOSPITAL): Payer: Medicare HMO

## 2020-07-15 DIAGNOSIS — Z8673 Personal history of transient ischemic attack (TIA), and cerebral infarction without residual deficits: Secondary | ICD-10-CM | POA: Insufficient documentation

## 2020-07-15 DIAGNOSIS — I2510 Atherosclerotic heart disease of native coronary artery without angina pectoris: Secondary | ICD-10-CM | POA: Insufficient documentation

## 2020-07-15 DIAGNOSIS — R221 Localized swelling, mass and lump, neck: Secondary | ICD-10-CM

## 2020-07-15 DIAGNOSIS — M25572 Pain in left ankle and joints of left foot: Secondary | ICD-10-CM

## 2020-07-15 DIAGNOSIS — N950 Postmenopausal bleeding: Secondary | ICD-10-CM | POA: Insufficient documentation

## 2020-07-15 DIAGNOSIS — Z884 Allergy status to anesthetic agent status: Secondary | ICD-10-CM | POA: Insufficient documentation

## 2020-07-15 DIAGNOSIS — R001 Bradycardia, unspecified: Secondary | ICD-10-CM

## 2020-07-15 DIAGNOSIS — I10 Essential (primary) hypertension: Secondary | ICD-10-CM | POA: Insufficient documentation

## 2020-07-15 DIAGNOSIS — Z91040 Latex allergy status: Secondary | ICD-10-CM | POA: Insufficient documentation

## 2020-07-15 DIAGNOSIS — D259 Leiomyoma of uterus, unspecified: Secondary | ICD-10-CM

## 2020-07-15 DIAGNOSIS — Z888 Allergy status to other drugs, medicaments and biological substances status: Secondary | ICD-10-CM | POA: Insufficient documentation

## 2020-07-15 DIAGNOSIS — N200 Calculus of kidney: Secondary | ICD-10-CM

## 2020-07-15 DIAGNOSIS — N939 Abnormal uterine and vaginal bleeding, unspecified: Secondary | ICD-10-CM | POA: Insufficient documentation

## 2020-07-15 DIAGNOSIS — Z7902 Long term (current) use of antithrombotics/antiplatelets: Secondary | ICD-10-CM | POA: Insufficient documentation

## 2020-07-15 DIAGNOSIS — N132 Hydronephrosis with renal and ureteral calculous obstruction: Secondary | ICD-10-CM

## 2020-07-15 DIAGNOSIS — Z880 Allergy status to penicillin: Secondary | ICD-10-CM | POA: Insufficient documentation

## 2020-07-15 DIAGNOSIS — N289 Disorder of kidney and ureter, unspecified: Secondary | ICD-10-CM

## 2020-07-15 HISTORY — DX: Leiomyoma of uterus, unspecified: D25.9

## 2020-07-15 LAB — TROPONIN_I
Troponin_I Interpretation: NORMAL
Troponin_I Interpretation: NORMAL
Troponin_I: 0.03 ng/mL (ref <0.04)
Troponin_I: 0.03 ng/mL (ref <0.04)

## 2020-07-15 LAB — URINALYSIS WITH REFLEX CULTURE
Bacteria, URN: NONE SEEN
Bilirubin (Qual), URN: NEGATIVE
Epith Cells_Renal/Trans,URN: NEGATIVE /HPF
Glucose Qual, URN: NEGATIVE mg/dL
Ketones, URN: NEGATIVE mg/dL
Nitrite, URN: NEGATIVE
RBC, URN: >20 /HPF — AB
Specific Gravity, URN: >1.04 g/mL — ABNORMAL HIGH (ref 1.006–1.027)
pH, URN: 7 (ref 5.0–8.0)

## 2020-07-15 LAB — CBC, DIFF
% Basophils: 1 %
% Eosinophils: 3 %
% Immature Granulocytes: 0 %
% Lymphocytes: 18 %
% Monocytes: 10 %
% Neutrophils: 68 %
% Nucleated RBC: 0 %
Absolute Eosinophil Count: 0.13 10*3/uL (ref 0.00–0.50)
Absolute Lymphocyte Count: 0.9 10*3/uL — ABNORMAL LOW (ref 1.00–4.80)
Basophils: 0.04 10*3/uL (ref 0.00–0.20)
Hematocrit: 43 % (ref 36.0–45.0)
Hemoglobin: 14.6 g/dL (ref 11.5–15.5)
Immature Granulocytes: 0.01 10*3/uL (ref 0.00–0.05)
MCH: 31.5 pg (ref 27.3–33.6)
MCHC: 33.7 g/dL (ref 32.2–36.5)
MCV: 94 fL (ref 81–98)
Monocytes: 0.52 10*3/uL (ref 0.00–0.80)
Neutrophils: 3.46 10*3/uL (ref 1.80–7.00)
Nucleated RBC: 0 10*3/uL
Platelet Count: 190 10*3/uL (ref 150–400)
RBC: 4.63 10*6/uL (ref 3.80–5.00)
RDW-CV: 13.1 % (ref 11.6–14.4)
WBC: 5.06 10*3/uL (ref 4.3–10.0)

## 2020-07-15 LAB — COMPREHENSIVE METABOLIC PANEL
ALT (GPT): 12 U/L (ref 7–33)
AST (GOT): 16 U/L (ref 9–38)
Albumin: 4 g/dL (ref 3.5–5.2)
Alkaline Phosphatase (Total): 66 U/L (ref 38–172)
Anion Gap: 8 (ref 4–12)
Bilirubin (Total): 0.5 mg/dL (ref 0.2–1.3)
Calcium: 9.6 mg/dL (ref 8.9–10.2)
Carbon Dioxide, Total: 28 meq/L (ref 22–32)
Chloride: 104 meq/L (ref 98–108)
Creatinine: 1.14 mg/dL — ABNORMAL HIGH (ref 0.38–1.02)
Glucose: 87 mg/dL (ref 62–125)
Potassium: 4 meq/L (ref 3.6–5.2)
Protein (Total): 6.7 g/dL (ref 6.0–8.2)
Sodium: 140 meq/L (ref 135–145)
Urea Nitrogen: 18 mg/dL (ref 8–21)
eGFR by CKD-EPI: 48 mL/min/{1.73_m2} — ABNORMAL LOW (ref >59)

## 2020-07-15 LAB — PROTHROMBIN TIME
Prothrombin INR: 1.2 (ref 0.8–1.3)
Prothrombin Time Patient: 14.7 s (ref 10.7–15.6)

## 2020-07-15 LAB — PARTIAL THROMBOPLASTIN TIME: Partial Thromboplastin Time: 33 s (ref 22–35)

## 2020-07-15 LAB — L LACTATE, VENOUS WB (TO U/H LAB WITHIN 30 MIN): L Lactate (Direct), Venous Whole Blood: 0.8 mmol/L (ref 0.6–1.9)

## 2020-07-15 LAB — 1ST EXTRA PEARL TOP

## 2020-07-15 LAB — MAGNESIUM: Magnesium: 2.1 mg/dL (ref 1.8–2.4)

## 2020-07-15 LAB — REFLEX CULTURE FOR UA

## 2020-07-15 MED ORDER — ACETAMINOPHEN 325 MG OR TABS
650.0000 mg | ORAL_TABLET | Freq: Once | ORAL | Status: AC
Start: 2020-07-15 — End: 2020-07-15
  Administered 2020-07-15: 650 mg via ORAL
  Filled 2020-07-15: qty 2

## 2020-07-15 MED ORDER — IOHEXOL 350 MG/ML IV SOLN
130.0000 mL | Freq: Once | INTRAVENOUS | Status: AC | PRN
Start: 2020-07-15 — End: 2020-07-15
  Administered 2020-07-15: 130 mL via INTRAVENOUS

## 2020-07-15 NOTE — ED Triage Notes (Signed)
Pt here with vaginal bleeding and some back pain that has been ongoing for a couple of weeks. Had a cva several weeks ago and has some residual confusion from this event.

## 2020-07-15 NOTE — ED Notes (Signed)
Pt dressing self at bedside, eager to discharge; son taking pt home     Wendy [name], [place]  07/15/20 1904
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
## 2020-07-15 NOTE — ED Notes (Signed)
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

## 2020-07-15 NOTE — Telephone Encounter (Signed)
S/O   Care coordinator approached to clinic RN and informed that patient needs triage relating to having Vignal bleeding while patient is here at [facility] in Radiology.   . Care coordinator states  patient's son reported to her patient including his call back [phone]   Pt's son came to the clinic after a little awhile and reported patient having vignal bleeding about two weeks and showed [place] blood on pt's pad.    Son continued reports patient having ABD pain and dizziness as well.    Consulted with practitioner and ANM and told to patient's son to take paient to ED and son greed to take her.

## 2020-07-15 NOTE — Discharge Instructions (Addendum)
You were evaluated today in the emergency department for vaginal bleeding.  Your labs are stable.  You had a CT scan showing fibroids and an ultrasound showing some abnormal uterine tissue with cysts.  I discussed her case with the gynecologist who recommend you follow-up in clinic in the referral has been made.  If they do not reach out to schedule appointment, please call them at the number listed below.  Before your appointment please take Tylenol as [place] will help with pain if they do need to biopsy.    Please return to the emergency department if you have heavy bleeding, lightheadedness, fast heartbeat, or any other symptoms of immediate concern.

## 2020-07-15 NOTE — Telephone Encounter (Signed)
RETURN CALL: Voicemail - Detailed Message      SUBJECT:  General Message     MESSAGE: Janine from [facility] requesting verbal orders.    Ongoing Occupational Therapy  1 to 2 times per week for six weeks  Effective 07/18/2020

## 2020-07-15 NOTE — Telephone Encounter (Signed)
Spoke to RNs in clinic and asked them to call son [name] to advise.

## 2020-07-16 ENCOUNTER — Telehealth (HOSPITAL_COMMUNITY): Payer: Self-pay | Admitting: Unknown Physician Specialty

## 2020-07-16 NOTE — Telephone Encounter (Signed)
Called from ED Provider for patient who presented to the ED. She is a 66yoF with hx s/f R MCA stroke (on Plavix and apixaban), CAD, HTN who presented for vaginal bleeding. She was hemodynamically stable. I recommended she have a TVUS for evaluation. maging significant for thickened endometrial stripe of 76mm. Discussed that she needs to be seen in clinic for EMB to rule out malignancy. ED provider placed GYN referral and I will message schedulers to schedule patient ASAP.     Fredia Beets, MD, MS--R3

## 2020-07-17 LAB — URINE C/S: Culture: >100000 — AB

## 2020-07-18 ENCOUNTER — Other Ambulatory Visit (HOSPITAL_BASED_OUTPATIENT_CLINIC_OR_DEPARTMENT_OTHER): Payer: Self-pay | Admitting: Unknown Physician Specialty

## 2020-07-18 ENCOUNTER — Other Ambulatory Visit (HOSPITAL_COMMUNITY): Payer: Self-pay | Admitting: Unknown Physician Specialty

## 2020-07-18 ENCOUNTER — Encounter (HOSPITAL_BASED_OUTPATIENT_CLINIC_OR_DEPARTMENT_OTHER): Payer: Self-pay | Admitting: Internal Medicine

## 2020-07-18 ENCOUNTER — Ambulatory Visit
Admission: RE | Admit: 2020-07-18 | Discharge: 2020-07-18 | Disposition: A | Discharge status: Home / Self Care | Payer: Medicare HMO | Attending: Neuroradiology | Admitting: Neuroradiology

## 2020-07-18 ENCOUNTER — Ambulatory Visit (HOSPITAL_BASED_OUTPATIENT_CLINIC_OR_DEPARTMENT_OTHER): Payer: Medicare HMO | Admitting: Adult Health

## 2020-07-18 VITALS — BP 112/50 | HR 69 | Temp 98.2°F | Wt 213.2 lb

## 2020-07-18 DIAGNOSIS — M549 Dorsalgia, unspecified: Secondary | ICD-10-CM

## 2020-07-18 DIAGNOSIS — E041 Nontoxic single thyroid nodule: Secondary | ICD-10-CM | POA: Insufficient documentation

## 2020-07-18 DIAGNOSIS — R7989 Other specified abnormal findings of blood chemistry: Secondary | ICD-10-CM

## 2020-07-18 DIAGNOSIS — R52 Pain, unspecified: Secondary | ICD-10-CM

## 2020-07-18 LAB — EKG 12 LEAD
Atrial Rate: 58 {beats}/min
P Axis: 62 degrees
P-R Interval: 208 ms
Q-T Interval: 434 ms
QRS Duration: 88 ms
QTC Calculation: 426 ms
R Axis: 61 degrees
T Axis: 62 degrees
Ventricular Rate: 58 {beats}/min

## 2020-07-18 NOTE — Telephone Encounter (Signed)
Janine from [facility] requested for verbal Occupational Therapy order  1 to 2 times per week for six weeks    A/P  This RN gave verbal order and informed to Sherlyn Hay to fax the order back to [place] for the provider to review.

## 2020-07-18 NOTE — Progress Notes (Signed)
[facility] CARE CLINIC SUBSEQUENT CARE VISIT NOTE         ID/CC:  Wendy [name] is a 75 year old community-dwelling female being seen for headache, sinus congestion, earache. Last visit with me 03/17/20.     HPI:  Baseline: lives alone at [facility] (senior housing)  Accompanied today by daughter  Interpreter or not: NO     Back pain   - upper back pain since post stroke discharge three weeks ago  - pt describe pain as if the spine bones are crushing each other.   - also pain at the base of skull and top of her head   - thi chi was not hurtful, but yoga was painful   - has been taking tylenol 500mg  once as needed and heating pad that was helpful. Relaxation breathing also helps to help  - pain pronounced mainly when walking, and up and down on bed       Elevated creatinine   - Has not been able to drink lytholite (citrate bicarbonate) drink recommended by kidney doctor   - probably drinking only about a litre of water at home since stroke. Was supposed to drink 2-3 litre of water   - not clear in thinking and having trouble in planning in advance. Issues with short term memory    - too much going on since stroke affecting water intake   - also low energy   Low hematocrit        Current Outpatient Medications   Medication Sig Dispense Refill    ACETAMINOPHEN OR Take by mouth. as needed      amLODIPine 10 MG tablet Take 1 tablet (10 mg) by mouth daily. 90 tablet 2    apixaban (Eliquis) 5 MG tablet Take 1 tablet (5 mg) by mouth 2 times a day. Blood thinner for stroke prevention. Stop aspirin after starting apixaban 180 tablet 3    aspirin 81 MG EC tablet Take 81 mg by mouth daily.      atorvastatin 80 MG tablet Take 1 tablet (80 mg) by mouth daily. (Patient not taking: Reported on 06/30/2020) 90 tablet 0    clopidogrel 75 MG tablet Take 1 tablet (75 mg) by mouth daily. 30 tablet 0    lisinopril 5 MG tablet Take 1 tablet (5 mg) by mouth daily. For heart and blood pressure 90 tablet 3    metoprolol succinate  ER 25 MG 24 hr tablet Take 0.5 tablets (12.5 mg) by mouth daily. Do not chew or crush. 15 tablet 0     No current facility-administered medications for this visit.        Review of patient's allergies indicates:  Allergies   Allergen Reactions    Hydrochlorothiazide Skin: Itching and Throat Swelling    Lidocaine Other     Hallucinations    Penicillins Skin: Hives     Reaction in [place] (??)  Has patient had a PCN reaction causing immediate rash, facial/tongue/throat swelling, SOB or lightheadedness with hypotension: Yes  Has patient had a PCN reaction causing severe rash involving mucus membranes or skin necrosis: No  Has patient had a PCN reaction that required hospitalization: No  Has patient had a PCN reaction occurring within the last 10 years: No  If all of the above answers are "NO", then may proceed with Cephalosporin use.      Coconut Fatty Acids      Other reaction(s): Other (See Comments)  Reaction??    Latex Skin:  Itching    Losartan Headache and Other     Other reaction(s): Other (See Comments), Unknown  Pt doesn't remember.  High blood pressure    Statins Headache and Other     Other reaction(s): Other (See Comments), Other (See Comments) high blood pressure, ear ache and foggy thinking        Geriatric ROS: per MS4 note 03/17/20  "ADLs: independent  IADLS: independent  Cognition: no acute concerns  Mood: not assessed  Mobility: no acute concerns  Vision: not assessed  Hearing: not assessed  Continence: not assessed, no issues previously"  CFS-9: 3-4    Social History:  Social History     Tobacco Use    Smoking status: Never Smoker    Smokeless tobacco: Never Used   Substance Use Topics    Alcohol use: Not Currently    Drug use: Not Currently   DPOA/Legal Next of Kin/main contact person/main contact #s: daughter Serene    Immunization History   Administered Date(s) Administered    COVID-19 Moderna mRNA LNP-S 07/03/2019, 08/11/2019, 04/14/2020    Influenza quadrivalent MDCK PF  (Flucelvax) 04/18/2019    Influenza quadrivalent PF 07/11/2017    Influenza quadrivalent adjuvanted (Fluad) 03/17/2020    Influenza trivalent high-dose 03/04/2018    Pneumococcal conjugate PCV13 (Prevnar 13) 05/21/2014    Pneumococcal polysaccharide PPSV23 (Pneumovax 23) 11/14/2012    Tdap 11/14/2012           PHYSICAL EXAMINATION:  There were no vitals taken for this visit.   Wt Readings from Last 5 Encounters:   06/24/20 (!) 105 kg (231 lb 7.7 oz)   06/23/20 (!) 101.5 kg (223 lb 11.2 oz)   03/17/20 98.4 kg (217 lb)   03/03/20 99 kg (218 lb 3.2 oz)   12/10/19 97.1 kg (214 lb)       GENERAL: well appearing woman in NAD  CVD: RRR with 3/6 systolic murmur. No rubs and gallops.   PULM: CTAB, unlabored breathing   NEURO: alert orientation, normal speech, moving all extremities   PSYCH: normal affect and mood   GAIT: slow       Assessment and plan       Upper back pain  Pt reported upper back pain between scapula since discharge.   XR c spine showed   "Mild degenerative disc space narrowing at C5-6 with mild posterior osteophytes. Mild facet hypertrophy at C5-6 and C6-7."  XR thoracic spine showed:" diffuse skeletal hyperostosis"  - advised pt to do PT which is being processed as part of stroke rehab. Advised to take tylenol 500 mg every eight hours and increase to 1057mf every eight if no improvement.     -     Cancel: XR C-T Spine; Future    Elevated serum creatinine  Creatinine was elevated very mildly but was trending up. She has right kidney atrophy. Pt did not go the lab for BMP recheck. She reported drinking insufficient amount since her stroke. It appears that post stroke her executive function is not there yet. Her son is on FMLA to help her. Pt and son are aware about fluid intake.   -     Basic Metabolic Panel; Future    Thyroid nodule  US showed thyroid nodule that requires FNA biopsy. We will not per sue this right now as pt is overwhelmed by post stroke rehab and biopsy for vaginal bleeding.       I  spent a total of 26 minutes for the patient's care  on the date of the service.        [place], [facility]   [phone]

## 2020-07-18 NOTE — Telephone Encounter (Signed)
Pt seen in [facility] ED has been followed up with GYN.  IS appointed to be seen in [facility] this afternoon

## 2020-07-19 ENCOUNTER — Encounter (HOSPITAL_BASED_OUTPATIENT_CLINIC_OR_DEPARTMENT_OTHER): Payer: Self-pay | Admitting: Internal Medicine

## 2020-07-19 NOTE — Result Encounter Note (Signed)
Endocrine: please advise regarding timing for thyroid nodule TIRADS 4. Currently undergoing evaluation for recent CVA and postmenopausal bleeding. Thanks.

## 2020-07-19 NOTE — Result Encounter Note (Signed)
L ankle XR no abnormality to explain pain.

## 2020-07-20 ENCOUNTER — Encounter (HOSPITAL_BASED_OUTPATIENT_CLINIC_OR_DEPARTMENT_OTHER): Payer: Self-pay

## 2020-07-20 ENCOUNTER — Encounter (HOSPITAL_BASED_OUTPATIENT_CLINIC_OR_DEPARTMENT_OTHER): Payer: Self-pay | Admitting: Urology

## 2020-07-20 ENCOUNTER — Ambulatory Visit: Payer: Medicare HMO | Attending: Obstetrics & Gynecology | Admitting: Unknown Physician Specialty

## 2020-07-20 ENCOUNTER — Other Ambulatory Visit (HOSPITAL_BASED_OUTPATIENT_CLINIC_OR_DEPARTMENT_OTHER): Payer: Self-pay | Admitting: Adult Health

## 2020-07-20 DIAGNOSIS — M4810 Ankylosing hyperostosis [Forestier], site unspecified: Secondary | ICD-10-CM

## 2020-07-20 DIAGNOSIS — R7989 Other specified abnormal findings of blood chemistry: Secondary | ICD-10-CM | POA: Insufficient documentation

## 2020-07-20 DIAGNOSIS — N200 Calculus of kidney: Secondary | ICD-10-CM

## 2020-07-20 DIAGNOSIS — N939 Abnormal uterine and vaginal bleeding, unspecified: Secondary | ICD-10-CM | POA: Insufficient documentation

## 2020-07-20 DIAGNOSIS — A630 Anogenital (venereal) warts: Secondary | ICD-10-CM | POA: Insufficient documentation

## 2020-07-20 DIAGNOSIS — N719 Inflammatory disease of uterus, unspecified: Secondary | ICD-10-CM | POA: Insufficient documentation

## 2020-07-20 HISTORY — DX: Ankylosing hyperostosis (forestier), site unspecified: M48.10

## 2020-07-20 LAB — BASIC METABOLIC PANEL
Anion Gap: 7 (ref 4–12)
Calcium: 9.7 mg/dL (ref 8.9–10.2)
Carbon Dioxide, Total: 30 meq/L (ref 22–32)
Chloride: 103 meq/L (ref 98–108)
Creatinine: 1.13 mg/dL — ABNORMAL HIGH (ref 0.38–1.02)
Glucose: 103 mg/dL (ref 62–125)
Potassium: 4.4 meq/L (ref 3.6–5.2)
Sodium: 140 meq/L (ref 135–145)
Urea Nitrogen: 16 mg/dL (ref 8–21)
eGFR by CKD-EPI: 48 mL/min/{1.73_m2} — ABNORMAL LOW (ref >59)

## 2020-07-20 MED ORDER — ACETAMINOPHEN 500 MG OR TABS
1000.0000 mg | ORAL_TABLET | Freq: Once | ORAL | Status: AC
Start: 2020-07-20 — End: 2020-07-20
  Administered 2020-07-20: 1000 mg via ORAL

## 2020-07-20 MED ORDER — ACETAMINOPHEN 500 MG OR TABS
ORAL_TABLET | ORAL | Status: AC
Start: 2020-07-20 — End: ?
  Filled 2020-07-20: qty 2

## 2020-07-20 NOTE — Progress Notes (Deleted)
Blood thinner may  2nd blood thinner in febfrrary

## 2020-07-20 NOTE — Progress Notes (Signed)
75 year old woman with a past medical history of right MCA (on clopidogrel and apixaban), CAD, and HTN presents emergency department with vaginal bleeding.      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     She started blood thinners in May.   She was seen in the ED on 2/4 for vaginal bleeding. GYN was called and recommended TVUS.   TVUS ultrasound: Multiple fibroids--two largest 6.3 x 5.6 x 5.35mm SM/IM, 5.5 x 4.6 x 5.7cm pedunculated, right.  EMS 60mm, enlarged, cystic lesions without well defined contours.     Patient reports she went into menopause in her 23s and has not any vaginal bleeding until now. She has had significant bleeding with clots and blood into the toilet.     OB HISTORY     OB History     Gravida   3    Para   2    Term   2    Preterm   0    AB   1    Living   2       SAB        IAB        Ectopic        Multiple        Live Births               # Date GA Outcome Labor Sex Weight Anes    1   Term            2   Term            3   Abortion                  GYN HISTORY   Menstrual Hx: Menarche in Junior [employer]. Menopause in her 11s.   Sexual history: Not currently sexually active.   Fertility plans: N/A  Cervical cancer screening hx: Reports has not had pap smears since her 60s  History of YPP:JKDTOI  History of sexual assault/abuse: Denies  History of trauma or severe pain with pelvic exams: NO  Breast health: Not discussed today, mammogram ordered currently by PCP  GYN surgery: H/o D&C, CSX x2, "fibroid surgery"       Objective:    Vitals: BP (!) 140/59    Pulse 64    Temp 36 C (Temporal)    Wt 97.1 kg  (214 lb)    BMI 40.43 kg/m   Physical Exam  General: alert, obese  Cardiac: Normal pulses, normal capillary refill,  Respiratory: Normal respiratory effort and chest wall movement with respiration.   Abdomen: soft, diffusely tender to palpation in lower abdomen in bilateral quadrants. No masses, but exam limited 2/2 habitis   Psychiatric:  Affect: Appropriate, Difficult to direct with questioning  Neurologic:  Gait:  Able to ambulate slowly.  Skin: Skin color, texture, turgor normal. No rashes or concerning lesions on visible areas.  Pelvic Exam: External genitalia normal, Cervix  with large mass prolapsing out of os. Mass appeared dense and was biopsied. EMB pipelle was threaded superior to mass to attempt to biopsy cavity. Uterus myomatous and she had bilateral adnexal tenderness  Exam chaperoned by Dr. Sadie [name]    Assessment and Plan:      Wendy [name] is a 75 year old P2 with postmenopausal bleeding, thickened endometrial stripe and prolapsing uterine mass on exam today.      #. Postmenopausal bleeding: Menopause at 75yo. No VB until recently. On clopidogrel and apixaban.   - Prolapsing uterine mass on exam, biopsied  - EMB performed and sent to path  - Discussed concerning findings given her age and US findings  - Case request placed for Wendy [name] and hysteroscopy, polypectomy, however, if pathology returns positive for malignancy will refer to onc. If path returns negative will have path re-review slides prior to hysteroscopy given high risk imaging findings. Patient is not an ideal surgical candidate given surgical history, however hysterectomy would be treatment of choice in the setting of malignancy.     # HCM:  - Cervical cancer screening: Has not had pap since 75yo, did not perform today given age >17    Follow Up Plan: PRN    Patient was seen with Dr. Sadie [name],, attending physician.     Wendy Hoit, MD  OB/GYN Resident Physician

## 2020-07-21 ENCOUNTER — Encounter (HOSPITAL_BASED_OUTPATIENT_CLINIC_OR_DEPARTMENT_OTHER): Payer: Self-pay | Admitting: Internal Medicine

## 2020-07-21 ENCOUNTER — Telehealth (HOSPITAL_BASED_OUTPATIENT_CLINIC_OR_DEPARTMENT_OTHER): Payer: Self-pay | Admitting: Internal Medicine

## 2020-07-21 ENCOUNTER — Telehealth (HOSPITAL_BASED_OUTPATIENT_CLINIC_OR_DEPARTMENT_OTHER): Payer: Medicare HMO | Admitting: Internal Medicine

## 2020-07-21 ENCOUNTER — Encounter (HOSPITAL_BASED_OUTPATIENT_CLINIC_OR_DEPARTMENT_OTHER): Payer: Self-pay

## 2020-07-21 ENCOUNTER — Other Ambulatory Visit (HOSPITAL_BASED_OUTPATIENT_CLINIC_OR_DEPARTMENT_OTHER): Payer: Self-pay | Admitting: Internal Medicine

## 2020-07-21 ENCOUNTER — Encounter (HOSPITAL_BASED_OUTPATIENT_CLINIC_OR_DEPARTMENT_OTHER): Payer: Self-pay | Admitting: Unknown Physician Specialty

## 2020-07-21 DIAGNOSIS — E042 Nontoxic multinodular goiter: Secondary | ICD-10-CM

## 2020-07-21 DIAGNOSIS — E782 Mixed hyperlipidemia: Secondary | ICD-10-CM

## 2020-07-21 DIAGNOSIS — N950 Postmenopausal bleeding: Secondary | ICD-10-CM

## 2020-07-21 DIAGNOSIS — I69318 Other symptoms and signs involving cognitive functions following cerebral infarction: Secondary | ICD-10-CM

## 2020-07-21 NOTE — Result Encounter Note (Signed)
T-spine XR thoracic kyphosis and findings suggestive of DISH.

## 2020-07-21 NOTE — Result Encounter Note (Signed)
Per endocrine repeat thyroid U/S 1 year (07/2021).

## 2020-07-22 ENCOUNTER — Encounter (HOSPITAL_BASED_OUTPATIENT_CLINIC_OR_DEPARTMENT_OTHER): Payer: Self-pay | Admitting: Unknown Physician Specialty

## 2020-07-22 ENCOUNTER — Encounter (HOSPITAL_BASED_OUTPATIENT_CLINIC_OR_DEPARTMENT_OTHER): Payer: Self-pay | Admitting: Internal Medicine

## 2020-07-22 ENCOUNTER — Ambulatory Visit: Payer: Medicare HMO | Attending: Pharmacist | Admitting: Pharmacist

## 2020-07-22 DIAGNOSIS — I10 Essential (primary) hypertension: Secondary | ICD-10-CM | POA: Insufficient documentation

## 2020-07-22 DIAGNOSIS — I480 Paroxysmal atrial fibrillation: Secondary | ICD-10-CM | POA: Insufficient documentation

## 2020-07-22 LAB — PATHOLOGY, SURGICAL

## 2020-07-22 NOTE — Result Encounter Note (Signed)
Bri, Can you see her.  Note OBGYN note too.  Nonfxn chronic obstr right kidney.  Following left status.  Maybe get creatinine prior.

## 2020-07-22 NOTE — Telephone Encounter (Signed)
[facility]: please scan FMLA form so I can complete hopefully on 2/14. I didn't see any FMLA form yesterday 2/10 when at [facility]. Thanks.

## 2020-07-22 NOTE — Result Encounter Note (Signed)
Renal function decreased but stable. Encourage fluids.

## 2020-07-22 NOTE — Progress Notes (Signed)
Medication Review-- HTN/anticoagulation     75 yo pt with recent hospital admission for ischemic cerebrovascular accident (CVA) involving right middle cerebral artery territory  thought to be 2/2 afib. PCP referred pt for DOAC choice and intiation pt was started on apixaban 7m BID- confirmed stopping ASA 86mdaily. Bleeding issue discussed at last phone call. ( blood on sheets  and she had some blood on toilet paper after wiping) likely d/t polyps per recent provider visit. Understands that clopidogrel 75 mg daily is to be continued.   Pt reports blood on sheets this am and she had some blood on toilet paper after wiping. Son sent pictures for PCP to review.   Otherwise pt feeling ok. No dizziness, CP, SOB or falls.  States that she had a rx to a statin in the past  (ear ache with subsequent foggy thinking) so not willing to start prescribed atorvastatin - asked that it be removed from her medication profile.   Prefers to take medications at night.   BPs wnl-- will record for visit with Cards next week.      Labs:  Results for orders placed or performed during the hospital encounter of 07/15/20   Comprehensive Metabolic Panel   Result Value Ref Range    Sodium 140 135 - 145 meq/L    Potassium 4.0 3.6 - 5.2 meq/L    Chloride 104 98 - 108 meq/L    Carbon Dioxide, Total 28 22 - 32 meq/L    Anion Gap 8 4 - 12    Glucose 87 62 - 125 mg/dL    Urea Nitrogen 18 8 - 21 mg/dL    Creatinine 1.14 (H) 0.38 - 1.02 mg/dL    Protein (Total) 6.7 6.0 - 8.2 g/dL    Albumin 4.0 3.5 - 5.2 g/dL    Bilirubin (Total) 0.5 0.2 - 1.3 mg/dL    Calcium 9.6 8.9 - 10.2 mg/dL    AST (GOT) 16 9 - 38 U/L    Alkaline Phosphatase (Total) 66 38 - 172 U/L    ALT (GPT) 12 7 - 33 U/L    eGFR, Calculated 48 (L) >59 mL/min/[1.73_m2]    GFR, Information       Calculated GFR by CKD-EPI equation. Inaccurate with changing renal function. See [email or web address].labmed.[email or web address]://wilkins.[email or web address]       A/P:  Afib/ s/p CVA: currently rate controlled on BB. Apixaban 2m56mID (  74, SCr 1.07, wt 105kg) started. Understands that blood on sheets 2/2 to polyps etc per provider explanation.  CHADS2Vasc score: 5, HASBLEED score: 4  Clopidogrel to continue until 11/2020 per Cards.  -- reviewed MOA, benefit/risk with current regimen.  Pt at higher risk of bleed given concurrent antiplatelet, age and htn- could consider PPI for GI protection.  -- Statin indicated s/p CVA and lipid panel elevated- pt states that she cannot tolerate and asked that it be removed from her medication profile.  - BP control goal <130/80- clinic number within goal.    F/U: [facility] 2/22, 07/28/20 M C[place]fieldarmD BCPS     This visit is being conducted over the telephone at the patients request: Yes  Patient gives verbal consent to proceed and knows there may be a copay/deductible: No deductible    Time spent on this telephone visit: 15 minutes  .

## 2020-07-23 NOTE — Progress Notes (Signed)
Attending addendum   I personally evaluated the patient, reviewed the chart and discussed the course and plan of care with the resident. I agree with the above documentation with the following additions/clarifications: agree with documented exam, there was a large, prolapsing mass out of the cerivx, red/purple in color, approx 4x3cm, vascular in appearance. Patient on blood thinners and exam was somewhat limited by patient discomfort. Multiple biopsies obtained from mass. Difficult to thread EMB Pipelle around this mass but ultimately we were able to obtain a sample. Will f/u with path. Will send to onc if malignant, will recommend hysteroscopy D&C with polypectomy in OR if benign.  Pamala Hurry, MD

## 2020-07-25 ENCOUNTER — Encounter (HOSPITAL_BASED_OUTPATIENT_CLINIC_OR_DEPARTMENT_OTHER): Payer: Self-pay | Admitting: Adult Health

## 2020-07-25 NOTE — Telephone Encounter (Signed)
Following forms completed for son:   [place] PFML form completed for continuous leave 1/18-3/15/22 (8 weeks).   [place] certification family member's serious health condition continuous leave 1/13-3/15/22    Forms faxed to [facility] to send to son Vilinda Flake.

## 2020-07-26 ENCOUNTER — Other Ambulatory Visit (HOSPITAL_BASED_OUTPATIENT_CLINIC_OR_DEPARTMENT_OTHER): Payer: Self-pay | Admitting: Internal Medicine

## 2020-07-26 DIAGNOSIS — I10 Essential (primary) hypertension: Secondary | ICD-10-CM

## 2020-07-26 NOTE — Telephone Encounter (Signed)
Completed FMLA form scanned to pt's chart. Called son Tawnya Crook who stated he and pt will be at clinic on Thursday 2/17 for an appt and will pick up then. Informed son Probation officer will leave the form at the front desk for him to pick up.

## 2020-07-28 ENCOUNTER — Ambulatory Visit: Payer: Medicare HMO | Attending: Cardiovascular Disease | Admitting: Cardiovascular Disease

## 2020-07-28 VITALS — BP 130/58 | HR 52 | Temp 96.8°F | Wt 213.8 lb

## 2020-07-28 DIAGNOSIS — I2510 Atherosclerotic heart disease of native coronary artery without angina pectoris: Secondary | ICD-10-CM | POA: Insufficient documentation

## 2020-07-28 DIAGNOSIS — Z951 Presence of aortocoronary bypass graft: Secondary | ICD-10-CM

## 2020-07-28 DIAGNOSIS — I480 Paroxysmal atrial fibrillation: Secondary | ICD-10-CM | POA: Insufficient documentation

## 2020-07-28 MED ORDER — AMLODIPINE BESYLATE 10 MG OR TABS
ORAL_TABLET | ORAL | 1 refills | Status: DC
Start: 2020-07-28 — End: 2021-03-27

## 2020-07-28 MED ORDER — ISOSORBIDE MONONITRATE ER 30 MG OR TB24
30.0000 mg | EXTENDED_RELEASE_TABLET | Freq: Every morning | ORAL | 3 refills | Status: DC
Start: 2020-07-28 — End: 2020-10-27

## 2020-07-28 NOTE — Telephone Encounter (Signed)
Left VM for pt and family- and sent mychart message.    Can plan for follow up with Sweet, MD for review of recent imaging.

## 2020-07-28 NOTE — Patient Instructions (Signed)
Glad to see that you are recoving from the stroke w/ an excellent attitude.  We will add Imdur (Isosoribide mononitrate) 30mg  a day, each morning.  You can let me know if it affects your ability to walk.  I will plan to see you in 3 months if everything is going fine.

## 2020-07-28 NOTE — Progress Notes (Signed)
Cardiology Follow-up    CC: Follow-up CAD/PCI.    Interval/HISTORY OF PRESENT ILLNESS:  Since I last saw her Wendy [name] was diagnosed with a CVA and then w/ Pafib (on an outpatient monitor).  She was started on Apixaban.  Wendy [name] was stopped and she will continue Clopidogrel until 11/2020.  Doing somewhat better.  No palpitations.  Has been having some of her DOE, she slows down and takes a few minutes of rest and then it's ok.  However it is difficult to say how similar or different the sensation is c/w prior to PCI because of cognitive problems she is having d/t the stroke.    Past History: Wendy [name] is a 75 y/o W with h/o CAD, s/p CABG (2006.  LIMA-LAD, SVG-dRCA (with endarterectomy of RPDA and PL), SeqSVG-RI-D1, SVG-D3).  Back in 2006 she had DOE and was worked up for CAD, ultimately had a cath and then CABG.  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  She has a h/o statin intolerance.  More recently she has developed DOE and has been limiting her activity.  As a result her PCP, Dr. Ronalee Red, ordered a DSE.  This showed inducible ischemia and reproduced her symptoms.  This led to PCI of SVG-->D1.  THis relieved her sx and allowed her to enjoy her dtrs wedding.  She did CR.    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41] 07/16/2019    Primary osteoarthritis of right knee [M17.11] 07/16/2019    Hx of CABG [Z95.1]      CABG (2006.  LIMA-LAD, SVG-dRCA (with endarterectomy of RPDA and PL), SeqSVG-RI-D1, SVG-D3)   Cath 2021 with lesion in SVG-Diagonal s/p PCI, occluded graft to ramus/diagonal, disease at the bifurcation of RCA graft insertion managed medically at that time as sx improving.       Essential hypertension [I10] 06/08/2019    Asthma [J45.909] 06/08/2019    Bipolar disorder without psychotic features ([place]) [F31.9] 06/08/2019    Coronary artery disease [I25.10] 06/08/2019     Multiple vessel 2006  Abnormal DSE 10/2019  Cath 11/18/19 PCI SVG-diagonal  Clopidogrel through 11/2020      Colon polyps [K63.5] 06/08/2019    Depression [F32.A] 06/08/2019    Generalized anxiety disorder [F41.1] 06/08/2019    Migraine [G43.909] 06/08/2019    Multinodular goiter [E04.2] 06/08/2019     07/15/20 thyroid U/S multiple thyroid nodules, R lobe one nodule TIRADS 4 --> per endocrine repeat U/S 1 year (07/2021)      Vitamin D deficiency [E55.9] 06/08/2019    Mixed hyperlipidemia [E78.2]      Cannot tolerate statins      Hyperparathyroidism ([place]) [E21.3]     Osteoporosis without current pathological fracture [M81.0]     Nephrolithiasis [N20.0] 04/27/2019    Non-functioning R kidney [N28.9] 01/22/2019  Due to ureteral stone      Pulmonary nodule less than 6 cm determined by computed tomography of lung [R91.1] 01/09/2019     R lung 5 mm nodule incidental finding [facility])      Abnormal computed tomography of pelvis [R93.5] 01/09/2019     Probable fibroid      Diastolic dysfunction [U27.25] 08/17/2017     Formatting of this note might be different from the original.   Normal left ventricular systolic function, ejection fraction 65 to 36%   Diastolic dysfunction - grade II (elevated filling pressures)   Dilated left atrium - mild   Degenerative mitral valve  disease   Mitral annular calcification   Aortic stenosis - mild   Aortic regurgitation - moderate   Normal right ventricular systolic function    ECHO 2017      History of R parathyroidectomy (2017) [E89.2] 02/07/2016    Aortic stenosis, mild [I35.0] 01/08/2016     02/25/18 TTE Mild aortic stenosis with mild-moderate aorticregurgitation  06/25/20 TTE mild-moderate AS      Aortic regurgitation [I35.1] 01/08/2016     02/25/18 TTE mild-moderate      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    History of R metatarsal fractures [Z87.81] 2017     2017 R 2nd, 3rd, 4th proximal metatarsals       ALL:  Review of patient's allergies indicates:  Allergies   Allergen Reactions    Hydrochlorothiazide Skin: Itching and Throat Swelling    Lidocaine Other     Hallucinations    Penicillins Skin: Hives     Reaction in [place] (??)  Has patient had a PCN reaction causing immediate rash, facial/tongue/throat swelling, SOB or lightheadedness with hypotension: Yes  Has patient had a PCN reaction causing severe rash involving mucus membranes or skin necrosis: No  Has patient had a PCN reaction that required hospitalization: No  Has patient had a PCN reaction occurring within the last 10 years: No  If all of the above answers are "NO", then may proceed with Cephalosporin use.      Coconut Fatty Acids      Other reaction(s): Other (See Comments)  Reaction??    Latex Skin: Itching    Losartan Headache and Other     Other reaction(s): Other (See Comments), Unknown  Pt doesn't remember.  High blood pressure    Statins Headache and Other     Other reaction(s): Other (See Comments), Other (See Comments) high blood pressure, ear ache and foggy thinking      MEDS:  Outpatient Medications Prior to Visit   Medication Sig Dispense Refill    acetaminophen 500 MG tablet Take 500 mg by mouth every 6 hours as needed.      ACETAMINOPHEN OR Take by mouth. as needed      amLODIPine 10 MG tablet take 1 tablet by mouth once  daily 90 tablet 1    apixaban (Eliquis) 5 MG tablet Take 1 tablet (5 mg) by mouth 2 times a day. Blood thinner for stroke prevention. Stop aspirin after starting apixaban 180 tablet 3    clopidogrel 75 MG tablet Take 1 tablet (75 mg) by mouth daily. 30 tablet 0    lisinopril 5 MG tablet Take 1 tablet (5 mg) by mouth daily. For heart and blood pressure 90 tablet 3    metoprolol succinate ER 25 MG 24 hr tablet Take 0.5 tablets (12.5 mg) by mouth daily. Do not chew or crush. 15  tablet 0     No facility-administered medications prior to visit.     PHYSICAL EXAM:  Vitals:    07/28/20 1500   BP: (!) 130/58   BP Cuff Size: Large   BP Site: Right Arm   BP Position: Sitting   Pulse: (!) 32   Temp: 36 C   TempSrc: Temporal     Gen: WD, WN woman, NAD  E: Anicteric  Neuro: Grossly nonfocal  CV: RRR SEM, soft diastolic murmur in aortic area, PMI not displaced, no LEE.  P: Normal M & A  S: No rashes    OTHER DATA:    Results for orders placed or performed in visit on 62/83/15   Basic Metabolic Panel   Result Value Ref Range    Sodium 140 135 - 145 meq/L    Potassium 4.4 3.6 - 5.2 meq/L    Chloride 103 98 - 108 meq/L    Carbon Dioxide, Total 30 22 - 32 meq/L    Anion Gap 7 4 - 12    Glucose 103 62 - 125 mg/dL    Urea Nitrogen 16 8 - 21 mg/dL    Creatinine 1.13 (H) 0.38 - 1.02 mg/dL    Calcium 9.7 8.9 - 10.2 mg/dL    eGFR, Calculated 48 (L) >59 mL/min/[1.73_m2]    GFR, Information       Calculated GFR by CKD-EPI equation. Inaccurate with changing renal function. See [email or web address]://wilkins.[email or web address]     DSE 11/03/2019:  1. Patient developed chest pain at peak stress, described to be similar to  the pain she felt before having her CABG, that resolved during recovery.   2. Baseline ECG shows normal sinus rhythm with nonspecific ST/T-wave changes.  At peak stress, there are inferior (III, aVF) ST elevations with reciprocal  ST depressions in leads I and aVL.   3. Baseline TTE shows normal LV size and systolic function (biplane  EF 65%)  with no regional wall motion abnormalities. There is mild mitral annular  calcification, mild-moderate aortic stenosis, and mild-moderate aortic regurgitation.  4. At peak stress, there is hypokinesis to akinesis in the myocardial  segments as diagrammed.   5. In summary, there is ECG and echocardiographic evidence of inducible ischemia.    11/18/19:  Impression:  1. Successful IVUS guided PCI of SVG-diagonal with 3.0(30) mm Resolute Onyx DES.  2. Diagnostic angiography demonstrating severe disease in SVG-diagonal and SVG-rPDA, occluded SVG-RI-diagonal, and patent LIMA-LAD.  3. Successful ultrasound guided 71F RFA access.  4. Perclose to the RFA.    Recommendations:  1. Patient initially loaded with ticagrelor 180 mg intraprocedure. Reloaded with 300 mg Plavix PO prior to discharge this evening. Continue Plavix 75 mg daily for at least 1 year.  2. Continue ASA 81 mg daily indefinitely.  3. Follow up visits with Dr. Bridgett Larsson  in 2 weeks to discuss further interventions (CTO PCI of ramus, CTO PCI of RCA) and we can rediscuss with her if clinically indicated.    TTE 06/23/20:  1. The left ventricle is normal in size. There is mild concentric increase in the wall thickness of the left ventricle. Global left ventricular function is normal (biplane EF 65%). The left ventricular average longitudinal peak  systolic strain is borderline (-17%). No regional wall motion abnormalities are present.  2. The right ventricle is normal size. The right ventricular systolic function is normal.  3. The aortic valve is mildly calcified. There is mild to moderate valvular aortic stenosis (Vmax 2.9 m/s, MG 20  mmHg, AVA 1.4 cm2). Mild to moderate aortic regurgitation is present.  4. Mild mitral annular calcification is present. The mitral annular calcification is posterior.  5. Agitated saline contrast study was negative at rest, with Valsalva, and with cough.  6. Pulmonary artery systolic pressure could not be estimated due to an  insufficient tricuspid regurgitant jet.  7. There is no pericardial effusion.    Compared with the baseline TTE from 11/03/2019, no significant changes are noted.    IMPRESSION/RECOMMENDATIONS: 75 y/o W with CAD s/p CABG with recurrent anginal equivalent DOE and abnormal DSE now s/p PCI to SVG-->Diagonal on 11/18/19.    CAD: Seems stable, but may be having some exertional angina.  Will continue current meds and add low dose Imdur (30qd).  Can stop Plavix 11/2020.  If sx don't improve may do a stress prior to re-referral for cath/PCI.    HTN: Is just about at goal, < 032 mmHg systolic.  Is on Lisinopril 85m po qd.  Will see what happens when Imdur is added.    AS/AR: Both mild-mod on 06/23/20.  Next TTE in 2-3 years.    Lipids.  Statin intolerant.  Will re-fer to Lipid clinic ([facility]).  Not sure what happened to prior referral.    Follow-up 3 mo.    I spent a total of 30 minutes for the patient's care on the date of the service.

## 2020-07-29 ENCOUNTER — Other Ambulatory Visit (HOSPITAL_BASED_OUTPATIENT_CLINIC_OR_DEPARTMENT_OTHER): Payer: Self-pay | Admitting: Pharmacist

## 2020-07-29 ENCOUNTER — Telehealth (HOSPITAL_BASED_OUTPATIENT_CLINIC_OR_DEPARTMENT_OTHER): Payer: Self-pay | Admitting: Cardiovascular Disease

## 2020-07-29 DIAGNOSIS — I480 Paroxysmal atrial fibrillation: Secondary | ICD-10-CM

## 2020-07-29 MED ORDER — METOPROLOL SUCCINATE ER 25 MG OR TB24
12.5000 mg | EXTENDED_RELEASE_TABLET | Freq: Every day | ORAL | 3 refills | Status: DC
Start: 2020-07-29 — End: 2020-10-27

## 2020-07-29 NOTE — Telephone Encounter (Signed)
RETURN CALL: Voicemail - Detailed Message      SUBJECT:  Refill Request    NAME OF MEDICATION(S): metoprolol succinate  DATE NEEDED BY: 07/30/20  PRESCRIBING PROVIDER: Theresa Mulligan  PHARMACY NAME/LOCATION: [facility] in [facility], on [facility] changed prescription dosing to 12.5 mg, but patient has 1 pill left for 2/19. Marking high priority because refill needed in 1 day.

## 2020-07-29 NOTE — Telephone Encounter (Signed)
Marvell Fuller, pharmacist, prefilled rx.   Called pts son and notified him

## 2020-08-05 ENCOUNTER — Telehealth (HOSPITAL_BASED_OUTPATIENT_CLINIC_OR_DEPARTMENT_OTHER): Payer: Self-pay | Admitting: Obstetrics & Gynecology

## 2020-08-05 NOTE — Addendum Note (Signed)
Addended by: Kathlene November on: 08/05/2020 08:46 AM     Modules accepted: Orders

## 2020-08-05 NOTE — Telephone Encounter (Signed)
Received call back from [facility], son - states pt had a stroke about one month ago and overall, recovering well.  She is not having any urinary symptoms at this time.  Plan will be for BMP check and f/u telemed with Sweet, MD on 3/7 to review CT from 2/4.    Note- pt is having surgery for uterine mass and will need anesthesia     BMP at [facility] next week

## 2020-08-05 NOTE — Telephone Encounter (Signed)
Unable to reach patient per Dr. Sadie Haber requests regarding coordination of care and scheduling surgery. I left a detailed messages for patient to return my call. Hoping to offer sooner surgery date 3/25 with Dr. Delorse Lek at [facility] or [place].     I'm waiting for patient to response.

## 2020-08-06 ENCOUNTER — Encounter (HOSPITAL_BASED_OUTPATIENT_CLINIC_OR_DEPARTMENT_OTHER): Payer: Self-pay | Admitting: Unknown Physician Specialty

## 2020-08-09 ENCOUNTER — Encounter (HOSPITAL_BASED_OUTPATIENT_CLINIC_OR_DEPARTMENT_OTHER): Payer: Self-pay | Admitting: Internal Medicine

## 2020-08-10 ENCOUNTER — Other Ambulatory Visit (HOSPITAL_BASED_OUTPATIENT_CLINIC_OR_DEPARTMENT_OTHER): Payer: Self-pay | Admitting: Internal Medicine

## 2020-08-10 ENCOUNTER — Other Ambulatory Visit (HOSPITAL_BASED_OUTPATIENT_CLINIC_OR_DEPARTMENT_OTHER): Payer: Self-pay | Admitting: Urology

## 2020-08-10 ENCOUNTER — Ambulatory Visit: Payer: Medicare HMO | Attending: Internal Medicine

## 2020-08-10 DIAGNOSIS — E042 Nontoxic multinodular goiter: Secondary | ICD-10-CM | POA: Insufficient documentation

## 2020-08-10 DIAGNOSIS — R7989 Other specified abnormal findings of blood chemistry: Secondary | ICD-10-CM | POA: Insufficient documentation

## 2020-08-10 DIAGNOSIS — N200 Calculus of kidney: Secondary | ICD-10-CM

## 2020-08-10 DIAGNOSIS — E782 Mixed hyperlipidemia: Secondary | ICD-10-CM | POA: Insufficient documentation

## 2020-08-10 LAB — THYROID STIMULATING HORMONE: Thyroid Stimulating Hormone: 1.908 u[IU]/mL (ref 0.400–5.000)

## 2020-08-10 LAB — BASIC METABOLIC PANEL
Anion Gap: 7 (ref 4–12)
Calcium: 9.6 mg/dL (ref 8.9–10.2)
Carbon Dioxide, Total: 30 meq/L (ref 22–32)
Chloride: 105 meq/L (ref 98–108)
Creatinine: 1.02 mg/dL (ref 0.38–1.02)
Glucose: 91 mg/dL (ref 62–125)
Potassium: 4.1 meq/L (ref 3.6–5.2)
Sodium: 142 meq/L (ref 135–145)
Urea Nitrogen: 13 mg/dL (ref 8–21)
eGFR by CKD-EPI: 55 mL/min/{1.73_m2} — ABNORMAL LOW (ref >59)

## 2020-08-10 LAB — T4, FREE: Thyroxine (Free): 0.9 ng/dL (ref 0.6–1.2)

## 2020-08-10 NOTE — Telephone Encounter (Signed)
Plan complete Thursday 3/3 AM when next @ [facility].

## 2020-08-10 NOTE — Telephone Encounter (Signed)
Form printed and placed in provider's box.

## 2020-08-11 ENCOUNTER — Encounter (HOSPITAL_BASED_OUTPATIENT_CLINIC_OR_DEPARTMENT_OTHER): Payer: Self-pay

## 2020-08-11 ENCOUNTER — Telehealth (HOSPITAL_BASED_OUTPATIENT_CLINIC_OR_DEPARTMENT_OTHER): Payer: Self-pay | Admitting: Obstetrics & Gynecology

## 2020-08-11 NOTE — Telephone Encounter (Signed)
Called son to clarify FMLA intermittent leave: 5 hours/day 5 days/week mid-January to mid-March. Revised form completed and given to [facility].

## 2020-08-11 NOTE — Telephone Encounter (Signed)
Contacted patient, scheduled HYSTEROSCOPY, WITH DILATION AND CURETTAGE OF UTERUS surgery.    Pre-op date: 10/23/20 at [place]: 09/02/20 at at [place]: 09/20/20 at [place] test: 08/30/20 at [facility]  RN teaching: 08/23/20 at [place]: please place an order of covid test.

## 2020-08-12 ENCOUNTER — Encounter (HOSPITAL_BASED_OUTPATIENT_CLINIC_OR_DEPARTMENT_OTHER): Payer: Self-pay | Admitting: Urology

## 2020-08-12 NOTE — Result Encounter Note (Signed)
Normal TFTs

## 2020-08-14 NOTE — Progress Notes (Deleted)
[facility] OUTPATIENT FOLLOW-UP VISIT  Distant Site Telemedicine Encounter    I conducted this encounter from [facility] via secure, live, face-to-face video conference with the patient. Wendy [name] was located at home with son.  I reviewed the risks and benefits of telemedicine as pertinent to this visit and the patient agreed to proceed.      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 Primary stone risk is hypocitraturia and low urine volume.   She was last seen in 03/2020 with goal of increasing optimizing her lifestyle and diet to decrease stone risks.   She returns today to discuss recent CT.     She has been feeling slightly fatigued, with back and joint issues. Her back and is located midline near the spine. She is drinking well. No dysuria or hematuria. No recent urinary tract infections.  She was recently seen in the ED for pelvic pain which they thought was likely due to uterine fibroids.     Active Meds:    No outpatient medications have been marked as taking for the 08/15/20 encounter (Appointment) with Sweet, Tye [place], MD.       Allergies:    Allergies as of 08/15/2020 - Reviewed 07/28/2020   Allergen Reaction Noted    Hydrochlorothiazide Skin: Itching and Throat Swelling 07/25/2019    Lidocaine Other 01/04/2016    Penicillins Skin: Hives 01/04/2016    Coconut fatty acids  02/20/2018    Latex Skin: Itching 01/04/2016    Losartan Headache and Other 01/04/2016    Statins Headache and Other 01/04/2016       Review of Systems   14 pt ROS was completed which was negative except as outlined in the HPI above      OBJECTIVE:  Physical Exam - not done d/t telemed    Labs reviewed today with the patient include:     Results for orders placed or performed in visit on 58/09/98   Basic Metabolic Panel   Result Value Ref Range    Sodium 142 135 - 145  meq/L    Potassium 4.1 3.6 - 5.2 meq/L    Chloride 105 98 - 108 meq/L    Carbon Dioxide, Total 30 22 - 32 meq/L    Anion Gap 7 4 - 12    Glucose 91 62 - 125 mg/dL    Urea Nitrogen 13 8 - 21 mg/dL    Creatinine 1.02 0.38 - 1.02 mg/dL    Calcium 9.6 8.9 - 10.2 mg/dL    eGFR by CKD-EPI 55 (L) >59 mL/min/[1.73_m2]   }    Imaging reviewed today with patient include:       ASSESSMENT  Wendy [name] is a 75 year old female with 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 Primary stone risk is hypocitraturia and low urine volume.   She has not been drinking the litholyte, but is amenable to trying it. She has also not done the 24hour urine.   The CT scan today demonstrated the same right sided stone with nonfunctioning right kidney and it demonstrated 4 small nonobstructing left renal stones (largest 40m). We discussed that we could go up and treat these small stones, but given their small size and the fact that she is asymptomatic, we can just do surveillance for now.  We discussed due to her solitary kidney status, if she were to feel like she is passing a stone, with flank pain or decreased urine output,she should present to the ER for evaluation immediately to prevent causing permanent renal damage.      PLAN:   - Increase fluid intake, drink litholyte or lemon juice with water  - complete 24hour urine study in 2-3 months after making the above changes  - repeat CTKUB.in 3-4 months to evaluate stone burden  - return precautions provided

## 2020-08-15 ENCOUNTER — Encounter (HOSPITAL_BASED_OUTPATIENT_CLINIC_OR_DEPARTMENT_OTHER): Payer: Self-pay | Admitting: Internal Medicine

## 2020-08-15 ENCOUNTER — Ambulatory Visit: Payer: Medicare HMO | Attending: Urology | Admitting: Urology

## 2020-08-15 ENCOUNTER — Encounter (HOSPITAL_BASED_OUTPATIENT_CLINIC_OR_DEPARTMENT_OTHER): Payer: Self-pay | Admitting: Urology

## 2020-08-15 DIAGNOSIS — N200 Calculus of kidney: Secondary | ICD-10-CM | POA: Insufficient documentation

## 2020-08-15 DIAGNOSIS — I63511 Cerebral infarction due to unspecified occlusion or stenosis of right middle cerebral artery: Secondary | ICD-10-CM

## 2020-08-15 DIAGNOSIS — N950 Postmenopausal bleeding: Secondary | ICD-10-CM

## 2020-08-15 DIAGNOSIS — F32A Depression, unspecified: Secondary | ICD-10-CM

## 2020-08-15 DIAGNOSIS — F411 Generalized anxiety disorder: Secondary | ICD-10-CM

## 2020-08-15 DIAGNOSIS — F319 Bipolar disorder, unspecified: Secondary | ICD-10-CM

## 2020-08-15 NOTE — Patient Instructions (Signed)
Hi Wendy [name],    It was good to see you today. As we discussed our main goal will be increased fluid intake and using litholyte. We will plan to do another 24H urine in 2-3 months to see if there is improvement after making these changes. We will then get a CTKUB in 3-4 months evaluate for stone growth.  Remember that if you feel like you are passing a stone an/or you have decreased urine output, you should go to the ER emergently given the fact that you only have one kidney.    Thanks,    Meta Hatchet, MD  Urology Resident    Jani Files, MD  Urology Attending

## 2020-08-15 NOTE — Progress Notes (Signed)
[facility] OUTPATIENT FOLLOW-UP VISIT  Distant Site Telemedicine Encounter    I conducted this encounter from [facility] via secure, live, face-to-face video conference with the patient. Wendy [name] was located at home with son.  I reviewed the risks and benefits of telemedicine as pertinent to this visit and the patient agreed to proceed.      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 Primary stone risk is hypocitraturia and low urine volume.   She was last seen in 03/2020 with goal of increasing optimizing her lifestyle and diet to decrease stone risks.   She returns today to discuss recent CT.     She has been feeling slightly fatigued, with back and joint issues. Her back and is located midline near the spine. She is drinking well. No dysuria or hematuria. No recent urinary tract infections.  She was recently seen in the ED for pelvic pain which they thought was likely due to uterine fibroids.     Active Meds:    No outpatient medications have been marked as taking for the 08/15/20 encounter (Telemedicine) with Gweneth Fredlund, Tye [place], MD.       Allergies:    Allergies as of 08/15/2020 - Reviewed 08/15/2020   Allergen Reaction Noted    Hydrochlorothiazide Skin: Itching and Throat Swelling 07/25/2019    Lidocaine Other 01/04/2016    Penicillins Skin: Hives 01/04/2016    Coconut fatty acids  02/20/2018    Latex Skin: Itching 01/04/2016    Losartan Headache and Other 01/04/2016    Statins Headache and Other 01/04/2016       Review of Systems   14 pt ROS was completed which was negative except as outlined in the HPI above      OBJECTIVE:  Physical Exam - not done d/t telemed    Labs reviewed today with the patient include:     Results for orders placed or performed in visit on 62/69/48   Basic Metabolic Panel   Result Value Ref Range    Sodium 142 135 - 145  meq/L    Potassium 4.1 3.6 - 5.2 meq/L    Chloride 105 98 - 108 meq/L    Carbon Dioxide, Total 30 22 - 32 meq/L    Anion Gap 7 4 - 12    Glucose 91 62 - 125 mg/dL    Urea Nitrogen 13 8 - 21 mg/dL    Creatinine 1.02 0.38 - 1.02 mg/dL    Calcium 9.6 8.9 - 10.2 mg/dL    eGFR by CKD-EPI 55 (L) >59 mL/min/[1.73_m2]   }    Imaging reviewed today with patient include:       ASSESSMENT  Wendy [name] is a 75 year old female with 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 Primary stone risk is hypocitraturia and low urine volume.   She has not been drinking the litholyte, but is amenable to trying it. She has also not done the 24hour urine.   The CT scan today demonstrated the same right sided stone with nonfunctioning right kidney and it demonstrated 4 small nonobstructing left renal stones (largest 57m). We discussed that we could go up and treat these small stones, but given their small size and the fact that she is asymptomatic, we can just do surveillance for now.  We discussed due to her solitary kidney status, if she were to feel like she is passing a stone, with flank pain or decreased urine output,she should present to the ER for evaluation immediately to prevent causing permanent renal damage.      PLAN:   - Increase fluid intake, drink litholyte or lemon juice with water  - complete 24hour urine study in 2-3 months after making the above changes  - repeat CTKUB.in 3-4 months to evaluate stone burden  - return precautions provided     I, Dr. Jani Files, saw and evaluated this patient in a combined visit with Dr. Meta Hatchet, MD. I participated in all key parts of the visit including the medical decision making. I reviewed the documentation provided by Dr. Mariel Kansky, and I agree with the plan of care as outlined.

## 2020-08-16 NOTE — Telephone Encounter (Signed)
BHIP referral ordered. MyChart reply to son.

## 2020-08-17 ENCOUNTER — Telehealth (HOSPITAL_BASED_OUTPATIENT_CLINIC_OR_DEPARTMENT_OTHER): Payer: Self-pay | Admitting: Nurse Practitioner

## 2020-08-17 NOTE — Progress Notes (Signed)
Chief Complaint: Hospital discharge follow up for Right MCA Ischemic Infarct  Date of hospitalization(s): 06/23/20- 06/28/20  PCP: Dr. Ether Griffins @ [facility]     HPI:  Wendy [name] is a 75 y/o RH female with hx of CAD s/p CABG in 2006, migraines with aura, parathyroidectomy 2019, and HTN. Per admit note on 06/23/20 by Dr. Lurline Hare, Wendy [name] presented with confusion and dizziness. She had reportedly had trouble talking, dizziness, and gait instability earlier in the day and EMS was not strongly indicated and therefore she and her daughter deferred. Ms. Pitones had PCP f/u the next day who was concerned for TIA/stroke, her BP was 140s and labs suggested UTI. CTA head/neck showed new hypodensity in the right temporoparietal region and confirmed on MRI brain. Imaging was notable for calcified vessel disease intracranially. Etiology unclear with concerns for cardioembolic vs athero narrowing.     Today, Wendy [name] presents via telemed with her son [name]. [name] states to be doing well, she states to be independent in ADLs her son helps manage her pills and pill boxes., planning, food planning, appts. Appetite has been "on and off" and eats when she's hungry "now" and historically chose thealthy food. She reports to cough "a lot" but doesn't relate it to the stroke. She denies added salt to meals but eats packaged foods. For exercise, she admits to not doing much. She used to do [place] but is limited in options of getting there, and will walk around her building sometimes. [name] states her sleep "is not good at all", she states to sleep on a wedge pillow because of GERD and has a hard time turning her brain off. She will fall asleep during the day and fall asleep in the chair/couch. She does wake during the night to use the bathroom, she does not wake rested. [name] feels cognitivey better but depressed and in back pain.Overall, [name] feels approx 50% back to baseline but limited by depression, back pain and not driving.      Has the patient seen their PCP or a specialist since discharge? Yes If yes, When?  Has the patient been monitoring their blood pressure outside of their HCP Office Visit? (At home or in their community)  YES   Range#: 125/65  Is the patient currently receiving therapy services? YES If yes, type & frequency?PT, SLP, OT    Previous workup-  CTA head/neck 06/23/20-1. CTA head: Large area of loss of gray-white differentiation within the right temporoparietal region suspicious for acute infarct concerning for acute infarct. No evidence of hemorrhagic conversion. 2. CTA head: Multiple punctate calcified lesions noted through the left MCA artery, most notable within the right M2 branch with decreased density of contrast filling distally near the region of infarct, likely reflecting nonocclusive calcified thrombus. Additional diffuse multifocal luminal irregularities involving the right greater than intracranial vessels, likely secondary to atherosclerotic disease 3. CTA neck: No high-grade stenosis, dissection or occlusion.  MRI brain WO 06/23/20-1.  Acute right MCA territory infarct. No evidence of hemorrhagic transformation.  EKG 06/24/20- Sinus rhythm with 1st degree AV block. Possible Left atrial enlargement. Anterior infarct , age undetermined. Abnormal ECG. When compared with ECG of 15-Oct-2019 13:05, Anterior infarct is now present. T wave inversion less evident in Lateral leads  TTE 06/25/20- 1. The left ventricle is normal in size. There is mild concentric increase in the wall thickness of the left ventricle. Global left ventricular function is normal (biplane EF 65%). The left ventricular average longitudinal peak  systolic strain is borderline (-17%). No regional wall motion abnormalities are present. 2. The right ventricle is normal size. The right ventricular systolic function is normal.3. The aortic valve is mildly calcified. There is mild to moderate valvular aortic stenosis (Vmax 2.9 m/s, MG 20 mmHg, AVA 1.4  cm2). Mild to moderate aortic regurgitation is present.4. Mild mitral annular calcification is present. The mitral annular calcification is posterior. 5. Agitated saline contrast study was negative at rest, with Valsalva, and with cough.6. Pulmonary artery systolic pressure could not be estimated due to an insufficient tricuspid regurgitant jet. 7. There is no pericardial effusion. Compared with the baseline TTE from 11/03/2019, no significant changes are noted.  30 day cardiac event monitor 06/28/20-07/28/20:This Wireless Event study reveals a Sinus Rhythm and Atrial Fibrillation/Flutter with rates ranging from 38bpm to 116 bpm. 1st Degree AVB was represented. PACs and Blocked PAC were noted. 30 days of monitoring. Atrial fibrillation and atrial flutter noted by auto trigger as high as 116 bpm. 94 patient triggerred events associated with normal sinus rythm, PAC, blocked PAC, sinus bradycardia, and sinus tachycardia.    Labs:  06/23/20-  Tot Cholest 283  Triglyc 168  HDL 46  LDL 203  HgbA1c 5.7%  BNP 174    OSA Screen STOP-BANG  Do you SNORE loudly (louder than talking or loud enough to be heard through closed doors)? no  Do you often feel TIRED, fatigued, or sleepy during daytime?  yes  Has anyone OBSERVED you stop breathing during your sleep? no  Do you have or are you being treated for high blood PRESSURE?  yes  BMI more than 35kg/m2? yes  AGE over 30 years old? yes  NECK circumference > 16 inches (40cm)? yes  GENDER: Female? no    TOTAL SCORE: high  High risk of OSA: Yes 5 - 8  Intermediate risk of OSA: Yes 3 - 4   Low risk of OSA: Yes 0 - 2    mRS=2  1) Could you live alone without any help from another person? This means being able to bathe, use the toilet, shop, prepare or get meals, and manage finances? Yes, go to question #2   No, go to question #3  2) Can you do everything that you were doing right before your stroke, even if slower and not as much? Yes, go to question #4   No(2) then stop  3) Can you walk form  one room to another without help from another person?  Yes (3) then stop   No, go to question #5  4) Are you completely back to the way you were right before your stroke?  Yes (0)  No (1)  5) Can you sit up in bed without any help? Yes (4)  No (5)     PHQ-9 (if yes to either, positive screen for depression)  Over the past 2 weeks, how often have you been bothered by any of the following problems: Not at all, several days, more than half the days, and nearly every day.  1. Little interest or pleasure in doing things? sometimes  2. Feeling down, depressed, or hopeless? Most of the time  3. Trouble falling asleep, staying asleep, or sleeping too much? Falling and staying sleep all the time  4. Feeling tired of having little energy? Most of the time  5. Poor appetite or overeating? Poor appetite sometimes  6. Feeling bad about yourself or that you're a failure or have let yourself or your family down? Sometimes  7. Trouble concentrating on things, such as reading the newspaper or watching television?sometimes   8. Moving or speaking so slowly that others could have noticed. Or, the opposite, being so fidgety or restless that you have been moving around more than usual? Not at all   9. Thoughts that you would be better off dead or of hurting yourself in some way? Sometimes self, but never others    PMH:  Outpatient Medications Prior to Visit   Medication Sig Dispense Refill   • acetaminophen 500 MG tablet Take 500 mg by mouth every 6 hours as needed.     • ACETAMINOPHEN OR Take by mouth. as needed     • amLODIPine 10 MG tablet take 1 tablet by mouth once daily 90 tablet 1   • apixaban (Eliquis) 5 MG tablet Take 1 tablet (5 mg) by mouth 2 times a day. Blood thinner for stroke prevention. Stop aspirin after starting apixaban 180 tablet 3   • clopidogrel 75 MG tablet Take 1 tablet (75 mg) by mouth daily. 30 tablet 0   • isosorbide mononitrate ER 30 MG 24 hr tablet Take 1 tablet (30 mg) by mouth every morning. 90 tablet 3    • lisinopril 5 MG tablet Take 1 tablet (5 mg) by mouth daily. For heart and blood pressure 90 tablet 3   • metoprolol succinate ER 25 MG 24 hr tablet Take 0.5 tablets (12.5 mg) by mouth daily. Do not chew or crush. 45 tablet 3     No facility-administered medications prior to visit.     Review of patient's allergies indicates:  Allergies   Allergen Reactions   • Hydrochlorothiazide Skin: Itching and Throat Swelling   • Lidocaine Other     Hallucinations   • Penicillins Skin: Hives     Reaction in [place] (??)  Has patient had a PCN reaction causing immediate rash, facial/tongue/throat swelling, SOB or lightheadedness with hypotension: Yes  Has patient had a PCN reaction causing severe rash involving mucus membranes or skin necrosis: No  Has patient had a PCN reaction that required hospitalization: No  Has patient had a PCN reaction occurring within the last 10 years: No  If all of the above answers are "NO", then may proceed with Cephalosporin use.     • Coconut Fatty Acids      Other reaction(s): Other (See Comments)  Reaction??   • Latex Skin: Itching   • Losartan Headache and Other     Other reaction(s): Other (See Comments), Unknown  Pt doesn't remember.  High blood pressure   • Statins Headache and Other     Other reaction(s): Other (See Comments), Other (See Comments) high blood pressure, ear ache and foggy thinking      Patient Active Problem List   Diagnosis   • Nephrolithiasis   • Hyperparathyroidism (HCC)   • Osteoporosis without current pathological fracture   • Non-functioning R kidney   • Essential hypertension   • Aortic stenosis, mild   • Asthma   • Bipolar disorder without psychotic features (HCC)   • Coronary artery disease   • Colon polyps   • Depression   • Generalized anxiety disorder   • Mixed hyperlipidemia   • Migraine   • History of R parathyroid adenoma   • History of R parathyroidectomy (2017)   • Multinodular goiter   • Vitamin D deficiency   • Hx of CABG   • Aortic  regurgitation   •   Pulmonary nodule less than 6 cm determined by computed tomography of lung    Abnormal computed tomography of pelvis    Body mass index 40.0-44.9, adult (HCC)    History of R metatarsal fractures    Primary osteoarthritis of right knee    Esophageal dysphagia    Gastroesophageal reflux disease without esophagitis    Diastolic dysfunction    Acute ischemic cerebrovascular accident (CVA) involving right middle cerebral artery territory ([place])    Paroxysmal atrial fibrillation (HCC)    Postmenopausal bleeding    DISH (diffuse idiopathic skeletal hyperostosis)    Chronic anticoagulation       ROS:  Gen: denies fatigue, fever, chills, flu-like symptoms  HEENT: +wears glasses, tinnitus. Denies vision changes- blurred, double, spotted  Neuro: +occ sinus HA., occ numbness/tingling left arm, occ dizziness with exertion  CV: denies CP or palpitations  Resp: +SOB with exertion, denies at rest  MSK: +back pain, occ joint pains  Psych: +depression, anxiety    Family Hx:  Family History     Problem (# of Occurrences) Relation (Name,Age of Onset)    Ankylosing Spondylitis (1) Maternal Uncle    Arthritis (1) Maternal Grandmother    Bipolar Disorder (2) Mother: Per outside records, Brother: Per outside records    Brain Cancer (1) Sister    Breast Cancer (1) Paternal Aunt    Hypertension (2) Sister: Per outside records, Mother    Myocardial Infarction (1) Father (84)       Negative family history of: Osteoporosis, Stroke        Social:  Social History     Socioeconomic History    Marital status: Divorced     Spouse name: Not on file    Number of children: 2   Tobacco Use    Smoking status: Never Smoker    Smokeless tobacco: Never Used   Substance and Sexual Activity    Alcohol use: Not Currently    Drug use: Not Currently    Sexual activity: Not on file   Other Topics Concern    Not on file   Social History Narrative    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 Originally  from [place]. 2 children. Enjoys tai chi.     Objective:  Vitals:    08/18/20 1051   Weight: 94.8 kg (209 lb)   Height: 5' 1" (1.549 m)     ++Unable to conduct complete neuro exam for telemed visit++    Gen: Appears well-groomed, comfortable, no acute distress  Neuro:   Mental status- A/Ox4, conversant   Cranial nerves-      I: smell-not assessed      II: Full visual fields - unable to test       III/IV/VI: Lids, EOM intact w/o nystagmus        V: Trigeminal- facial sensations- unable to test       VII: Facial expressions- smile/puffs cheeks equal bilat       VIII: hearing- mild HOH in video conversation       IX/X: swallow/palate- unable to test       XI: strength equal bilat shoulder shrug       XII: tongue/speech- protrudes midline   Motor-       Romberg: negative       Pronator drift: negative   Higher cortical-       Language: clear       Repeating: follows commands       Agnosia/recognition: intact (pen,  ruler, scissors)   Cerebellum/Gait-         Fine motor: finger-to-nose & finger tapping equal bilat.  tandem gait- slight unsteadiness       Gait: normal, equal arm swing, no shuffled steps/wide walk or leg lag.   Resp: no acute distress  MSK: symmetric, equal strength, normal bulk and tone  Skin: Warm, dry, pink, visibly intact.   Psych: pleasant, calm, cooperative    Assessment:  [name] is a 74 y/o RH female with hx of CAD s/p CABG in 2006, migraines with aura, parathyroidectomy 2019, and HTN who presented with confusion and dizziness. She had reportedly had trouble talking, dizziness, and gait instability earlier in the day, see by PCP the next day who was concerned for TIA/stroke, her BP was 140s and labs suggested UTI. CTA head/neck showed new hypodensity in the right temporoparietal region and confirmed on MRI brain. Imaging was notable for calcified vessel disease intracranially. Etiology unclear with concerns for cardioembolic vs athero narrowing. She was dc'd with cardiac event monitoring and found  to have AFib. Since started on Eliquis and continues on Plavix for CAD s/p CABG presumably. Kyana appears well but is very tangential, and easily distractible. She reports baseline "ADHD" not on medications, as well as depression and anxiety not currently managed, much support and redirection provided. Mersadie appears to have difficulty understanding some simple questions. Virtual physical exam notable for some unsteadiness on tandem gait but grossly unremarkable. We discussed what a stroke is, types of strokes, risk factors, her specific risk factors, post-stroke fatigue, approximate recovery timeline of 6 months to one year with risk of recurrence highest in the first 3-6 months, and prevention- stressing lifestyle modifications. Reviewed BEFAST and the importance of calling 911 immediately.     Impression/Plan:  #Right MCA Ischemic Infarct- etiology likely cardioembolic in setting of new Afib detected on event monitoring, now on Eliquis, continues on Plavix.   #BP- therapies check BP, reports <130/80. On Amlodipine, Isosorbide, Lisinopril, Metoprolol. Encouraged home self monitoring BP for long term goal <130/80 consistently.   #Lipids- elevated LDL, CTA notable for "Multiple punctate calcified lesions noted through the left MCA artery, most notable within the right M2 branch with decreased density of contrast filling distally near the region of infarct, likely reflecting nonocclusive calcified thrombus. Additional diffuse multifocal luminal irregularities involving the right greater than intracranial vessels, likely secondary to atherosclerotic disease". Not on statin d/t allergies and intolerance. PCP to f/u, may benefit from Lipid Clinic consult.   #OSA screen- discussed s/sx of OSA and correlation with HTN for increased stroke risk. STOPBANG=high risk, recommend sleep med f/u, she is amenable, referral placed to [facility].   #Diet/exercise- Discussed heart healthy diet, DASH diet info provided on electronic  AVS. She has help with her meal planning, works with HH PT/OT/SLP. Is independent in physical function but has cognitive difficulties.  #Mood- Endorses depression/anxiety and sometimes self harm thoughts without a plan or action. Positive PHQ9, not on antidepressants or anxiolytics. Behavioral health referral in place, awaiting scheduling. Referral placed to [facility] for further support and training for community skills.     Follow-up:  [facility]   Ob/Gyn 08/23/20  Stroke Attending 10/06/20  PCP 10/27/20  [facility] 10/31/20    Pt verbalized understanding and agreed to plan. All questions addressed at this time.     Distant Site Telemedicine Encounter    I conducted this encounter from [facility] via secure, live, face-to-face video   conference with the patient. Brylei was located at private residence with her son.  I reviewed the risks and benefits of telemedicine as pertinent to this visit and the patient agreed to proceed.

## 2020-08-17 NOTE — Telephone Encounter (Signed)
RETURN CALL: Voicemail - Detailed Message      SUBJECT:  Cancellation/Reschedule Request     REASON: Patient has Tai Chi Scheduled at the same time as her appointment on 08/18/20 and wants to know what her appointment was for before she cancelled.   ADDITIONAL INFORMATION: Spine issues for the past couple weeks, wants doctor to know.

## 2020-08-18 ENCOUNTER — Encounter (HOSPITAL_BASED_OUTPATIENT_CLINIC_OR_DEPARTMENT_OTHER): Payer: Self-pay | Admitting: Nurse Practitioner

## 2020-08-18 ENCOUNTER — Ambulatory Visit: Payer: Medicare HMO | Attending: Nurse Practitioner | Admitting: Nurse Practitioner

## 2020-08-18 VITALS — Ht 61.0 in | Wt 209.0 lb

## 2020-08-18 DIAGNOSIS — I480 Paroxysmal atrial fibrillation: Secondary | ICD-10-CM | POA: Insufficient documentation

## 2020-08-18 DIAGNOSIS — I639 Cerebral infarction, unspecified: Secondary | ICD-10-CM

## 2020-08-18 DIAGNOSIS — I10 Essential (primary) hypertension: Secondary | ICD-10-CM | POA: Insufficient documentation

## 2020-08-18 DIAGNOSIS — F411 Generalized anxiety disorder: Secondary | ICD-10-CM | POA: Insufficient documentation

## 2020-08-18 DIAGNOSIS — E782 Mixed hyperlipidemia: Secondary | ICD-10-CM | POA: Insufficient documentation

## 2020-08-18 DIAGNOSIS — Z7901 Long term (current) use of anticoagulants: Secondary | ICD-10-CM | POA: Insufficient documentation

## 2020-08-18 DIAGNOSIS — F32A Depression, unspecified: Secondary | ICD-10-CM | POA: Insufficient documentation

## 2020-08-18 NOTE — Patient Instructions (Addendum)
You were seen in the Stroke Clinic at [facility] today. We discussed your stroke and how to prevent another one.     Please continue ALL your medications as prescribed    Please START: nothing new today    Please STOP: --    The following referrals have been made for you: [facility] to evaluate for possible Sleep Apnea. [facility] for support with stroke recovery, community engagement and skills training.     +Check your blood pressure daily, write it down. Long term goal blood pressure is less than 130/80 consistently.   +Continue to work on eating a heart healthy diet low in fat, salt, and cholesterol.   +Gently increase your daily exercise as tolerated. Pace yourself.  +Keep home free and clear of loose rugs, wires, or things you could trip on.  +Continue follow up with your Primary Care Provider.    Discharge Instructions for Stroke  You have a high risk for a stroke, ora TIA (transient ischemic attack).During a stroke, blood stops flowing to part of your brain. This can damage areas in the brain that control other parts of the body. Symptoms from a stroke depend on which part of the brain has been affected.   Stroke risk factors  Once youve had a stroke, youre at greater risk for another one. Listed below are some other factors that can raise your risk for a stroke:    High blood pressure   High cholesterol   Cigarette or cigar smoking   Diabetes   Carotid or other artery disease   Atrial fibrillation, atrial flutter,or other heart disease   Not being physically active   Obesity   Certain blood disorders such as sickle cell anemia   Drinking too much alcohol   Abusing street drugs   Race   Gender   Family history of stroke   Diet high in salty, fried, or greasy foods  Changes in daily living  Doingsome everyday tasks may be hard after youve had a stroke. But you can learn new ways to manage your daily  activities. In fact, doing daily activities may help you to regain muscle strength. This can also help your affected arm or leg work more normally. Be patient. Give yourself time to adjust. And appreciate the progress you make.   Daily activities  You may be at risk of falling. Make changes to your home to help you walk more easily. A therapist will decide if you need an assistive device, such as a cane or walker, to walk safely.   You may need to see an occupational therapist (OT). Or you may see a physical therapist (PT). These healthcare providers can help you to learn new ways of doing things. For example, you may need to make changes in how you bathe or dress. You may also need a speech therapist. This is someone who helps you speak normally again and be able to swallow.   Tips for showering or bathing   Test the water temperature with a hand or foot that was not affected by the stroke.   Use grab bars, a shower seat, a hand-held showerhead, and a long-handled brush.   Use any other assistive device as advised by your therapists.  Tips for getting dressed   Dress while sitting, starting with the affected side or limb.   Wear shirts that pull easily over your head. Wear pants or skirts with elastic waistbands.  Use zippers with loops attached to the pull tabs.    Lifestyle changes   Take your medicines exactly as directed. Dont skip doses.   Begin an exercise program. Ask your provider how to get started. Ask how much activity you should try to get every day or week. You can benefit from simple activities such as walking or gardening.   Limit how much alcohol you drink.   Control your cholesterol level. Follow your providers advice about how to do this.   If you are a smoker, quit now. Join a stop-smoking program to improve your chances of success. Ask your provider about medicines or other methods to help you quit.   Learn stress management methods. These can help you deal with stress in your  home and work life.  Diet  Your healthcare provider will guide you on changes you may need to make to your diet. He or she may advise that you see a registered dietitian for help with diet changes. The changes can improve your cholesterol, blood pressure, and blood sugar. Changes may include:    Reducing the amount of fat and cholesterol you eat   Reducing the amount of salt (sodium) in your diet, especially if you have high blood pressure   Eating more fresh vegetables and fruits   Eating more lean proteins, such as fish, poultry, and beans and peas (legumes)   Eating less red meat and processed meats   Using low-fat dairy products   Limiting vegetable oils and nut oils   Limiting sweets and processed foods such as chips, cookies, and baked goods   Not eating trans fats. These are often found in processed foods. Don't eat any food that has hydrogenated listed in its ingredients.  Follow-up care   Keep your medical appointments. Close follow-up is important to stroke rehabilitation and recovery.   Some medicines require blood tests to check for progress or problems. Keep follow-up appointments for any blood tests ordered by your providers.    Call 911  Call 911 right awayif you have any of the following symptoms of stroke:    Weakness, tingling, or loss of feeling on one side of your face or body   Sudden double vision or trouble seeing in one or both eyes   Sudden trouble talking or slurred speech   Trouble understanding others   Sudden, severe headache   Dizziness, loss of balance, or a sense of falling   Blackouts or seizures  StayWell last reviewed this educational content on 06/11/2018   2000-2020 [employer]. [address] [place] [address]. All rights reserved. This information is not intended as a substitute for professional medical care. Always follow your healthcare professional's instructions.      Eating Heart-Healthy Food: Using the [place] for your  heart doesnt have to be hard or boring. You just need to know how to make healthier choices. The DASH eating plan has been developed to help you do just that. DASH stands for Dietary Approaches to Stop Hypertension. It is a plan that has been proven to be healthier for your heart and to lower your risk for high blood pressure. It can also help lower your risk for cancer, heart disease, osteoporosis, and diabetes.  Choosing from each food group  Choose foods from each of the food groups below each day. Try to get the recommended number of servings for each food group. The serving numbers are based on a  diet of 2,000 calories a day.Talk with your healthcare provider if youre not sure about your calorie needs. Along with getting the correct servings, the DASH plan also advises less than 2,300 mg of salt (sodium) per day. Lowering sodium intake to 1,500 mg per day lowers blood pressure even more. (There's about 2,300 mg of sodium in 1 teaspoon of salt.)    Grains  Servings: 6 to 8 a day  A serving is:   1 slice bread   1 ounce dry cereal   Half a cup cooked rice, pasta or cereal  Best choices: Whole grains and any grains high in fiber. Vegetables  Servings: 4 to 5 a day  A serving is:   1 cup raw leafy vegetable   Half a cup cut-up raw or cooked vegetable   Half a cup vegetable juice  Best choices: Fresh or frozen vegetables prepared without added salt or fat.   Fruits  Servings: 4 to 5 a day  A serving is:   1 medium fruit   One-quarter cup dried fruit   Half a cup fresh, frozen, or canned fruit   Half a cup of 100% fruit juices  Best choices: A variety of fresh fruits of different colors. Whole fruits are abetter choice than fruit juices. Low-fat or fat-free dairy  Servings: 2 to 3 a day  A serving is:   1 cup milk   1 cup yogurt   One and a half ounces cheese  Best choices: Skim or 1% milk, low-fat or fat-free yogurt or buttermilk, and low-fat cheeses.       Lean meats, poultry, fish  Servings:6  or fewer a day  A serving is:   1ounce cooked meats, poultry, or fish   1 egg  Best choices: Leanpoultry and fish. Trim away visible fat. Broil, grill, roast, or boil instead of frying. Remove skin from poultry before eating. Limit how much red meat you eat. Nuts, seeds, beans  Servings: 4 to 5 a week  A serving is:   One-third cup nuts (one and a half ounces)   2 tablespoons nut butter or seeds   Half a cup cooked dry beans or legumes  Best choices: Dry roasted nuts with no salt added, lentils, kidney beans, garbanzo beans, and whole pinto beans.   Fats and oils  Servings: 2 to 3 a day  A serving is:   1 teaspoon vegetable oil   1 teaspoon soft margarine   1 tablespoonmayonnaise   2 tablespoons salad dressing  Best choices: Nut and vegetable oils(nontropical vegetable oils), such as olive and canola oil. Sweets  Servings: 5 a week or fewer  A serving is:   1 tablespoon sugar, maple syrup, or honey   1 tablespoon jam or jelly   1 half-ounce jelly beans (about 15)   1 cup lemonade  Best choices: Dried fruit can be a satisfying sweet. Choose low-fat sweets. And watch your serving sizes!     For more on the DASH eating plan, visit:  https://www.reyes.com/   StayWell last reviewed this educational content on 12/09/2017   2000-2020 [employer]. [address], [place] [address]. All rights reserved. This information is not intended as a substitute for professional medical care. Always follow your healthcare professional's instructions.      Taking Your Blood Pressure  Blood pressure is the force of blood against the artery wall as it moves from the heart through the blood vessels. You can take  your own blood pressure reading using a digital monitor. Take your readings the same each time, using the same arm. Take readings as often as your healthcare provider advises.  About blood pressure monitors  Blood pressure monitors are designed for certain ages and  cases. You can find monitors for older adults, for pregnant women, and for children. Make sure the one you choose is the right one for your age and situation.  The American Heart Association advises an automatic cuff monitor that fits on your upper arm (bicep). The cuff should fit your arm size. A cuff thats too large or too small will not give an accurate reading. Measure around your upper arm to find your size.  Monitors that attach to your finger or wrist are not as accurate as monitors for your upper arm.  Ask your healthcare provider for help in choosing a monitor. Bring your monitor to your next provider visit if you need help in using it the correct way.  The steps below are general instructions for using an automatic digital monitor.  Step 1. Relax     Take your blood pressure at the same time every day, such as in the morning or evening. Or take it at the time your healthcare provider advises.   Wait at least30 minutes after smoking, eating, or exercising. Don't drink coffee, tea, soda, or other caffeinated drinks before checking your blood pressure. If needed, use the restroom beforehand.   Sit comfortably at a table with both feet on the floor. Don't cross your legs or feet. Place the monitor near you.   Rest for a few minutes before you begin. Make sure there are no distractions. This includes TV, cell phones, and other electronics. Wait to have conversations with others until after you measure you blood pressure.  Step 2. Wrap the cuff     Place your arm on the table, palm up. Your arm should be at the level of your heart. Wrap the cuff around your upper arm, just above your elbow. Its best done on bare skin, not over clothing. Most cuffs will show you where the blood vessel in the middle of the arm at the inner side of the elbow (the brachial artery) should line up with the cuff. Look in your monitor's instruction booklet for an illustration. You can also bring your cuff to your healthcare  provider and have them show you how to correctly place the cuff.  Step 3. Inflate the cuff     Push the button that starts the pump.   The cuff will tighten, then loosen.   The numbers will change. When they stop changing, your blood pressure reading will appear.   Take 2 or 3 readings 1 minute apart.  Step 4. Write down the results of each reading     Write down your blood pressure numbers for each reading. Note the date and time. Keep your results in one place, such as a notebook. Even if your monitor has a built-in memory, keep a hard copy of the readings.   Remove the cuff from your arm. Turn off the machine.   Bring your blood pressure records with you to each healthcare provider visit.   If you start a new blood pressure medicine, note the day you started the new medicine. Also note the day if you change the dose of your medicine. Measure your blood pressure before your take your medicine. This information goes on your blood pressure recording sheet. This will help  your healthcare provider check how well the medicine changes are working.   Ask your provider what numbers mean that you should call him or her. Also ask what numbers mean you should get help right away.  StayWell last reviewed this educational content on 12/09/2017   2000-2020 [employer]. [address], [place] [address]. All rights reserved. This information is not intended as a substitute for professional medical care. Always follow your healthcare professional's instructions.    Thank you for entrusting your medical care with me, it is a privilege working with you. If you have any questions, or concerns, please do not hesitate to contact us. Continue to monitor for signs and symptoms of stroke including, but not limited to, and call 911 immediately for-  Facial Droop  Weakness/Numbness/Tingling on one side of your body  Difficulty Talking  Sudden severe headache  Sudden vision changes in one or both eyes  Sudden  confusion  Chest pain  Palpitations  Shortness of breath  Dizziness/Lightheadedness  Nausea and/or vomiting     Sincerely,    Wendy Poet, [facility]
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
## 2020-08-23 ENCOUNTER — Ambulatory Visit (HOSPITAL_BASED_OUTPATIENT_CLINIC_OR_DEPARTMENT_OTHER): Payer: Medicare HMO

## 2020-08-23 ENCOUNTER — Other Ambulatory Visit (HOSPITAL_BASED_OUTPATIENT_CLINIC_OR_DEPARTMENT_OTHER): Payer: Self-pay | Admitting: Unknown Physician Specialty

## 2020-08-23 ENCOUNTER — Ambulatory Visit: Payer: Medicare HMO | Attending: Obstetrics & Gynecology | Admitting: Unknown Physician Specialty

## 2020-08-23 ENCOUNTER — Encounter (HOSPITAL_BASED_OUTPATIENT_CLINIC_OR_DEPARTMENT_OTHER): Payer: Self-pay | Admitting: Unknown Physician Specialty

## 2020-08-23 ENCOUNTER — Encounter (HOSPITAL_BASED_OUTPATIENT_CLINIC_OR_DEPARTMENT_OTHER): Payer: Self-pay

## 2020-08-23 VITALS — BP 125/60 | HR 72 | Temp 96.8°F | Ht 61.0 in | Wt 214.0 lb

## 2020-08-23 DIAGNOSIS — Z01818 Encounter for other preprocedural examination: Secondary | ICD-10-CM

## 2020-08-23 DIAGNOSIS — Z719 Counseling, unspecified: Secondary | ICD-10-CM

## 2020-08-23 DIAGNOSIS — R300 Dysuria: Secondary | ICD-10-CM | POA: Insufficient documentation

## 2020-08-23 DIAGNOSIS — N950 Postmenopausal bleeding: Secondary | ICD-10-CM

## 2020-08-23 LAB — URINALYSIS WITH REFLEX CULTURE

## 2020-08-23 NOTE — Progress Notes (Signed)
"  Wendy [name]" in here with son [name] for her pre op teachings. She's schedule for Hysteroscopy on September 02, 2020 @ [facility]. Gave patient and son a copy of the map and instructions to the [facility] in [facility]. COVID testing is scheduled on 08/30/2020 @ 1445. Gave her a copy of her appointment for COVID testing.   NPO, skin preparations for CHG and pain medications information discussed to patient and son. Patient requested no narcotics for her pain.   Informed patient and son that [facility] or Gwyndolyn Saxon will call them for patient's check in time. Gave William's phone number [phone] if in case they have not heard from them. Both of them verbalized understanding.

## 2020-08-24 NOTE — Telephone Encounter (Signed)
[facility] pt

## 2020-08-25 NOTE — Telephone Encounter (Signed)
[facility]: should BHIP referral be routed to different department? Thanks.

## 2020-08-25 NOTE — Progress Notes (Signed)
I saw and evaluated the patient with the resident. I have reviewed the resident's documentation and agree with it.    Daffney Greenly, MD

## 2020-08-25 NOTE — Addendum Note (Signed)
Addended by: Carolanne Grumbling on: 08/25/2020 04:10 PM     Modules accepted: Level of Service

## 2020-08-26 NOTE — Telephone Encounter (Signed)
Will request SW to assist with locating outside behavioral health provider due to long waiting time for BHIP.

## 2020-08-27 ENCOUNTER — Encounter (HOSPITAL_BASED_OUTPATIENT_CLINIC_OR_DEPARTMENT_OTHER): Payer: Self-pay | Admitting: Internal Medicine

## 2020-08-29 ENCOUNTER — Encounter (HOSPITAL_BASED_OUTPATIENT_CLINIC_OR_DEPARTMENT_OTHER): Payer: Self-pay | Admitting: Student in an Organized Health Care Education/Training Program

## 2020-08-29 DIAGNOSIS — Z299 Encounter for prophylactic measures, unspecified: Secondary | ICD-10-CM

## 2020-08-29 NOTE — H&P (Signed)
Added [facility] for anticoagulation. Remainder of pre-op orders previously placed.

## 2020-08-30 ENCOUNTER — Telehealth (HOSPITAL_BASED_OUTPATIENT_CLINIC_OR_DEPARTMENT_OTHER): Payer: Self-pay

## 2020-08-30 ENCOUNTER — Ambulatory Visit: Payer: Medicare HMO | Attending: Internal Medicine

## 2020-08-30 ENCOUNTER — Ambulatory Visit (HOSPITAL_COMMUNITY): Payer: Medicare HMO

## 2020-08-30 DIAGNOSIS — Z20822 Contact with and (suspected) exposure to covid-19: Secondary | ICD-10-CM | POA: Insufficient documentation

## 2020-08-30 DIAGNOSIS — Z01812 Encounter for preprocedural laboratory examination: Secondary | ICD-10-CM | POA: Insufficient documentation

## 2020-08-30 NOTE — Progress Notes (Signed)
Patient was seen today at the [facility] COVID-19 testing site where a dual collection sample was taken from the anterior nares. The specimen was sent to the [facility] lab for COVID-19 testing. Results will be available within 48 hours. Patient received informational instructions on self-care. The specimen was collected by designated RN/LPN/MA per standing order.

## 2020-08-30 NOTE — Telephone Encounter (Signed)
SOCIAL WORK FOLLOW-UP NOTE    08/30/2020  Brief description: Care coordination and transportation resources    Patient description, presenting problem, patient / family goals: Patient is a English speaking 75 year old female, receiving medical care with the [facility]. SW referred by the medical team for support with connecting to mental health provider, pt referred to [facility] program and placed on wait list.     Pt lives independently in [place] apartments. Pt identifies her children as very supportive, shares they assist with her paperwork and transportation to medical appointments since her stroke. Pt expressed concern that she is a burden on her children, concerned about them having to take time off work and provide transportation.     Pt describes previously engaging with [facility], shares she would like to re-engage in their activities, but would like transportation resources.     Pt shares she earns SS and a small pension each month, believes her monthly income is around $2000.00. Pt has applied to [facility] for additional caregiver support, states she has a phone intake appointment scheduled for tomorrow. Pt has Medicare and [place] insurance coverage.    Intervention / Outcome / Plan:   1. SW spoke with pt, provided assessment, support and discussed resources.     2. SW coordinated with BHIP providers regarding pts status on wait list. Per BHIP, pt is first on their wait list, anticipated to be scheduled in 1 month.     SW informed pt of the anticipated wait time, pt shares being able to wait another month for her appointment; shares she prefers to have all her care through [facility]/ systems.    SW provided information about [facility] [employer]. Per pt request, this SW mailed Human resources officer and [employer] brochure to her home. Pt shares she will review and let this SW know if additional support is needed.    3. SW and pt talked about in  home care services available through [facility] through [place] program. Pt shares she will talk with her daughter about the program tomorrow. Pt agreeable with having additional help at home.    SW contact information provided and will remain available for continued support/assistance as needed.     Loretha Brasil, MSW  [place]  [employer]  [phone]

## 2020-08-31 LAB — COVID-19 CORONAVIRUS QUALITATIVE PCR: COVID-19 Coronavirus Qual PCR Result: NOT DETECTED

## 2020-09-01 ENCOUNTER — Ambulatory Visit (HOSPITAL_BASED_OUTPATIENT_CLINIC_OR_DEPARTMENT_OTHER): Payer: Medicare HMO | Admitting: Pre Anesthesia
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
## 2020-09-01 NOTE — Result Encounter Note (Signed)
eCare message sent for negative COVID-19 test result

## 2020-09-01 NOTE — Progress Notes (Incomplete)
{  Vanishing Tip   In the "Insert SmartText" search bar , search for    Daily Progress Note   [facility] IP PROGRESS NOTE     Post-op Progress Note   [facility] IP GYNECOLOGY POST OP PROGRESS NOTE   :999}

## 2020-09-02 ENCOUNTER — Ambulatory Visit (HOSPITAL_BASED_OUTPATIENT_CLINIC_OR_DEPARTMENT_OTHER): Payer: Medicare HMO | Admitting: Pain Medicine

## 2020-09-02 ENCOUNTER — Encounter (HOSPITAL_COMMUNITY): Payer: Self-pay | Admitting: Obstetrics & Gynecology

## 2020-09-02 ENCOUNTER — Encounter (HOSPITAL_COMMUNITY)
Admission: RE | Disposition: A | Discharge status: Home / Self Care | Payer: Self-pay | Source: Home / Self Care | Attending: Obstetrics & Gynecology

## 2020-09-02 ENCOUNTER — Inpatient Hospital Stay
Admission: RE | Admit: 2020-09-02 | Discharge: 2020-09-02 | Disposition: A | Discharge status: Home / Self Care | Payer: Medicare HMO | Attending: Obstetrics & Gynecology | Admitting: Obstetrics & Gynecology

## 2020-09-02 ENCOUNTER — Encounter (HOSPITAL_COMMUNITY): Payer: Self-pay | Admitting: Pain Medicine

## 2020-09-02 DIAGNOSIS — I10 Essential (primary) hypertension: Secondary | ICD-10-CM | POA: Insufficient documentation

## 2020-09-02 DIAGNOSIS — I480 Paroxysmal atrial fibrillation: Secondary | ICD-10-CM | POA: Insufficient documentation

## 2020-09-02 DIAGNOSIS — I2510 Atherosclerotic heart disease of native coronary artery without angina pectoris: Secondary | ICD-10-CM | POA: Insufficient documentation

## 2020-09-02 DIAGNOSIS — E785 Hyperlipidemia, unspecified: Secondary | ICD-10-CM | POA: Insufficient documentation

## 2020-09-02 DIAGNOSIS — N950 Postmenopausal bleeding: Secondary | ICD-10-CM

## 2020-09-02 DIAGNOSIS — J45909 Unspecified asthma, uncomplicated: Secondary | ICD-10-CM | POA: Insufficient documentation

## 2020-09-02 DIAGNOSIS — N840 Polyp of corpus uteri: Secondary | ICD-10-CM | POA: Insufficient documentation

## 2020-09-02 DIAGNOSIS — K219 Gastro-esophageal reflux disease without esophagitis: Secondary | ICD-10-CM | POA: Insufficient documentation

## 2020-09-02 DIAGNOSIS — Z299 Encounter for prophylactic measures, unspecified: Secondary | ICD-10-CM

## 2020-09-02 HISTORY — PX: HYSTEROSCOPY W/ POLYPECTOMY: SHX1775

## 2020-09-02 LAB — PROTHROMBIN TIME
Prothrombin INR: 1.1 (ref 0.8–1.3)
Prothrombin Time Patient: 13.5 s (ref 10.7–15.6)

## 2020-09-02 LAB — GLUCOSE POC, ~~LOC~~: Glucose (POC): 108 mg/dL (ref 62–125)

## 2020-09-02 SURGERY — HYSTEROSCOPY, WITH DILATION AND CURETTAGE OF UTERUS
Anesthesia: General | Site: Uterus | Laterality: Bilateral | Wound class: Class II/ Clean Contaminated

## 2020-09-02 MED ORDER — DEXAMETHASONE SODIUM PHOSPHATE 4 MG/ML IJ SOLN
4.0000 mg | Freq: Once | INTRAMUSCULAR | Status: DC | PRN
Start: 2020-09-02 — End: 2020-09-02

## 2020-09-02 MED ORDER — LACTATED RINGERS BOLUS
500.0000 mL | Freq: Once | INTRAVENOUS | Status: DC | PRN
Start: 2020-09-02 — End: 2020-09-02

## 2020-09-02 MED ORDER — ETOMIDATE 2 MG/ML IV SOLN
INTRAVENOUS | Status: DC | PRN
Start: 2020-09-02 — End: 2020-09-02
  Administered 2020-09-02: 12 mg via INTRAVENOUS

## 2020-09-02 MED ORDER — LABETALOL HCL 5 MG/ML IV SOLN
2.5000 mg | INTRAVENOUS | Status: DC | PRN
Start: 2020-09-02 — End: 2020-09-02

## 2020-09-02 MED ORDER — FENTANYL CITRATE (PF) 250 MCG/5ML IJ SOLN
12.5000 ug | INTRAMUSCULAR | Status: DC | PRN
Start: 2020-09-02 — End: 2020-09-02

## 2020-09-02 MED ORDER — ONDANSETRON HCL 4 MG/2ML IJ SOLN
4.0000 mg | INTRAMUSCULAR | Status: DC | PRN
Start: 2020-09-02 — End: 2020-09-02

## 2020-09-02 MED ORDER — MENTHOL 3.3 MG MT LOZG
1.0000 | LOZENGE | OROMUCOSAL | Status: DC | PRN
Start: 2020-09-02 — End: 2020-09-02
  Administered 2020-09-02: 1 via OROMUCOSAL
  Filled 2020-09-02: qty 1

## 2020-09-02 MED ORDER — LACTATED RINGERS IV SOLN
100.0000 mL/h | INTRAVENOUS | Status: DC
Start: 2020-09-02 — End: 2020-09-02

## 2020-09-02 MED ORDER — PLASMA-LYTE A IV SOLN
10.0000 mL/h | INTRAVENOUS | Status: DC
Start: 2020-09-02 — End: 2020-09-02

## 2020-09-02 MED ORDER — EPHEDRINE SULFATE-NACL 25-0.9 MG/5ML-% IV SOSY
PREFILLED_SYRINGE | INTRAVENOUS | Status: DC | PRN
Start: 2020-09-02 — End: 2020-09-02
  Administered 2020-09-02: 5 mg via INTRAVENOUS

## 2020-09-02 MED ORDER — ACETAMINOPHEN 325 MG OR TABS
650.0000 mg | ORAL_TABLET | ORAL | Status: DC
Start: 2020-09-02 — End: 2020-09-02
  Administered 2020-09-02: 650 mg via ORAL

## 2020-09-02 MED ORDER — GLYCOPYRROLATE 0.2 MG/ML IJ SOLN
0.2000 mg | INTRAMUSCULAR | Status: DC | PRN
Start: 2020-09-02 — End: 2020-09-02

## 2020-09-02 MED ORDER — LACTATED RINGERS IV SOLN
INTRAVENOUS | Status: DC | PRN
Start: 2020-09-02 — End: 2020-09-02

## 2020-09-02 MED ORDER — ONDANSETRON HCL 4 MG/2ML IJ SOLN
INTRAMUSCULAR | Status: DC | PRN
Start: 2020-09-02 — End: 2020-09-02
  Administered 2020-09-02: 4 mg via INTRAVENOUS

## 2020-09-02 MED ORDER — PROPOFOL 10 MG/ML IV EMUL WRAPPER (OSM ONLY)
INTRAVENOUS | Status: DC | PRN
Start: 2020-09-02 — End: 2020-09-02
  Administered 2020-09-02: 30 mg via INTRAVENOUS

## 2020-09-02 MED ORDER — HEPARIN SODIUM (PORCINE) 5000 UNIT/ML IJ SOLN
INTRAMUSCULAR | Status: AC
Start: 2020-09-02 — End: ?
  Filled 2020-09-02: qty 1

## 2020-09-02 MED ORDER — LIDOCAINE HCL 1 % IJ SOLN
INTRAMUSCULAR | Status: AC
Start: 2020-09-02 — End: ?
  Filled 2020-09-02: qty 20

## 2020-09-02 MED ORDER — OXYCODONE HCL 5 MG OR TABS
5.0000 mg | ORAL_TABLET | ORAL | Status: DC | PRN
Start: 2020-09-02 — End: 2020-09-02

## 2020-09-02 MED ORDER — LIDOCAINE HCL 1 % IJ SOLN
INTRAMUSCULAR | Status: DC | PRN
Start: 2020-09-02 — End: 2020-09-02
  Administered 2020-09-02: 20 mL via INTRAMUSCULAR

## 2020-09-02 MED ORDER — HEPARIN SODIUM (PORCINE) 5000 UNIT/ML IJ SOLN
5000.0000 [IU] | Freq: Once | INTRAMUSCULAR | Status: AC
Start: 2020-09-02 — End: 2020-09-02
  Administered 2020-09-02: 5000 [IU] via SUBCUTANEOUS

## 2020-09-02 SURGICAL SUPPLY — 9 items
CANISTER SUCTION 3000CC HIGH FLOW (Other) ×4 IMPLANT
DEVICE REM 357MM 2.9MM TRUCLEAR TISSUE WINDOW (Blade) ×2 IMPLANT
GLOVE SURG 6.5 BIOGEL MICRO INDICATOR PF (Glove) ×2 IMPLANT
GLOVE SURG BIOGEL 6 ULTRATOUCH PF (Glove) ×2 IMPLANT
GOWN SIRUS AAMI L3 XL BLUE ×6 IMPLANT
HYSTEROSCOPE RIGID TRUCLEAR ELITE PLUS FLUID (Other) ×2 IMPLANT
KIT FLUID MGMT HYSTEROLUX HYSTEROSCOPIC (Pack) ×2 IMPLANT
PACK CUSTOM MINOR LITHOTOMY (Pack) ×2 IMPLANT
SEAL HYSTEROSCOPE RIGID TRUCLEAR ELITE PLUS FLUID (Other) ×1 IMPLANT

## 2020-09-02 NOTE — Op Note (Signed)
Gynecology Operative Note   Wendy [name] - DOB: [birth year] 75 year old female) MRN: [id number]  Procedure Date: 09/02/2020 [place] MAIN OR       Preoperative Diagnosis:   Postmenopausal bleeding  History of chronic anticoagulation  Thickened endometrium  Concern for endometrial polyps    Post Operative Diagnosis:    Postmenopausal bleeding  History of chronic anticoagulation  Multiple endometrial polyps    Procedures Performed:      EXAM UNDER ANESTHESIA, HYSTEROSCOPY, POLYPECTOMY      Surgeons:     Delorse Lek, Melissa Noon, MD - Primary     Sandria Bales, MD - Resident - Assisting    Anesthesia:  General, paracervical block with 20 cc of 1% lidocaine   EBL: 5 mL   Specimens: Endometrial curettings    Indication(s): Wendy [name] is an 75 year old [id number] female with a complex medical history including CAD, HTN and prior CVA who presented with postmenopausal bleeding in the setting of chronic anticoagulation with clopidogrel and apixaban.  At time of initial evaluation, she was noted to have a prolapsing uterine mass that was biopsied and came back negative for neoplasm.  A pelvic ultrasound showed a thickened endometrium of 18 mm with cystic lesions without well-defined contours, concerning for endometrial polyps.  She was counseled on management options and she was recommended to proceed with surgical management.    Finding(s): Exam under anesthesia revealed normal external genitalia, vagina with decreased rugation and small cervix without abnormal lesions, partially flush with the vagina. Bimanual exam revealed slightly enlarged, mobile anteverted uterus with smooth contours though exam was limited secondary to body habitus. No adnexal masses were palpated. Hysteroscopy revealed 5 large endometrial polyps filling approximately 90% of the uterine cavity. After the polypectomy, both tubal ostia were visualized.       Fluid media used: Normal saline  Fluid deficit: 155cc  Procedure Details:   The risks,  benefits, indications and alternatives of the procedure were reviewed with the patient and informed consent was obtained. Final verification was performed. The patient was taken to the Operating Room where general anesthesia was achieved without difficulty.    Examination under anesthesia was performed with findings noted above. The patient was positioned in the dorsal lithotomy position in yellow fin stirrups, and prepped and draped in the usual sterile fashion. A sterile speculum was placed in the patients vagina, and single toothed tenaculum on the posterior cervical lip. The hysteroscope was advanced through the cervix with gradual dilation using hydrodissection until the cavity was visualized. Hysteroscopic findings are detailed above. An operative hysteroscopy was performed using the TruClear soft tissue shaver device with removal of the endometrial polyps under direct visualization.  Following polypectomy, the uterine Cavity was normal in appearance with no further visible polyps.    At the end of the case, a paracervical block was performed with 20cc of 1% lidocaine without epinephrine. The tenaculum was removed from the cervix and good hemostasis was noted. Hemostasis was achieved using pressure. The patient tolerated the procedure well and was taken to the recovery room in stable condition.    Complications: None.     Condition: stable     Sandria Bales, MD // OBGYN PGY-2

## 2020-09-02 NOTE — Interval H&P Note (Signed)
The linked note is still valid and up to date; no changes are required. All patient questions were answered. Previously signed consent for was confirmed. Plan to proceed to OR for scheduled case.

## 2020-09-02 NOTE — Anesthesia Procedure Notes (Signed)
Airway Placement    Staff:  Performing Provider: Judi Cong, CRNA  Authorizing Provider: Mertha Baars, MD    Airway management:   Patient location: OR/Procedural area  Final airway type: supraglottic airway  Intubation reason: general anesthesia    Induction:  Positioning: supine  Patient was pre-oxygenated: yes  Mask Ventilation: Grade 1 - Ventilated by mask     Intubation:    Final Attempt   Airway Type: SGA  LMA Type: iGel  LMA Size: LMA Size:3    Number of Attempts: 1    Assessment:  Confirmation: waveform capnography and auscultation  Procedure Abandoned: no    Date / Time Airway Secured / Re-Secured:  09/02/2020 12:42 PM

## 2020-09-02 NOTE — Anesthesia Postprocedure Evaluation (Addendum)
Patient: Wendy [name]    Procedure Summary     Date: 09/02/20 Room / Location: [facility] / [place] [facility] OR    Anesthesia Start: 1231 Anesthesia Stop: 1194    Procedure: HYSTEROSCOPY, WITH DILATION AND CURETTAGE OF UTERUS (Bilateral Uterus) Diagnosis:       Postmenopausal bleeding      (Postmenopausal bleeding [N95.0])    Surgeons: Kathryne Gin, MD Responsible Provider: Mertha Baars, MD    Anesthesia Type: general ASA Status: 4        Final Anesthesia Type: general    Vitals Value Taken Time   BP 128/74 09/02/20 1415   Temp 37.5 C 09/02/20 1415   Pulse 60 09/02/20 1415   SpO2 99 % 09/02/20 1415       Place of evaluation: PACU    Patient participation: patient participated    Level of consciousness: fully conscious    Patient pain control satisfaction: patient is satisfied with level of pain control    Airway patency: patent    Cardiovascular status during assessment: stable    Respiratory status during assessment: breathing comfortably    Anesthetic complications: no    Intravascular volume status assessment: euvolemic    Nausea / vomiting: patient is not experiencing nausea      Planned post-operative disposition at time of assessment: hospital discharge

## 2020-09-02 NOTE — Discharge Instructions (Signed)
GYN DISCHARGE INSTRUCTIONS - HYSTEROSCOPY    . Nothing in the vagina for 2 weeks (no sex or tampons)  . You can take up to 1000mg of tylenol every 6 hours as needed for pain. You can take up to 600mg of ibuprofen every 6 hours as needed for pain as well.  . Light vaginal bleeding and cramping is to be expected today and tomorrow. Should resolve in next few days.    Call M.D. If:   Temperature above 101.5 degrees   Vaginal bleeding greater than one pad per hour   Nausea/vomiting and unable to tolerate liquids   Pain not controlled by oral medications   Swelling, pain or tenderness in a calf   Increase in redness, drainage, pus, swelling, warmth, and/or odor from incision/wound   Call 911 if unrelieved chest pain or shortness of breath, nausea vomiting or pain that is not controlled by your oral medications, vaginal bleeding soaking more than 1 pad per hour    To speak to a provider (clinic during business hours or provider on call outside of business hours), please call [facility]: [phone]

## 2020-09-02 NOTE — Procedure Nursing Note (Signed)
PACU Summary, Outpatient Discharge:    Patient summary/synopsis  Level of consciousness: awake, alert and oriented  Nausea:None  Dressing(s) are: peripad with small amount serosang drainage  Pain is present: throat pain, denies surgical pain  Oral fluids taken: Yes  Voiding Status: Voided  Precautions or restrictions: Fall     Per attending anesthesia request: patient to stay until 1630 for observation due to co-morbidities.  She has done well, up to BR with out complaints. No dizziness, chest pain or shortness of breath.    Home with: Written instructions.   Escort present:Yes

## 2020-09-06 ENCOUNTER — Encounter (HOSPITAL_BASED_OUTPATIENT_CLINIC_OR_DEPARTMENT_OTHER): Payer: Self-pay | Admitting: Internal Medicine

## 2020-09-07 LAB — PATHOLOGY, SURGICAL

## 2020-09-07 NOTE — Telephone Encounter (Signed)
Encounter for billing home health initial certification and plan of care. Form scanned and billed into encounter. Services ordered: OT, SLP, SN    Start date: 07/05/20   End Date: 09/02/20       Home Health certification/recertification faxed to [facility] at [phone]. Fax confirmation received. Documents scanned and attached to encounter.

## 2020-09-17 ENCOUNTER — Encounter (HOSPITAL_BASED_OUTPATIENT_CLINIC_OR_DEPARTMENT_OTHER): Payer: Self-pay

## 2020-09-17 NOTE — Progress Notes (Signed)
[place] VISIT    ID/CHIEF COMPLAINT  Wendy [name] is a 76 year old [id number] who presents for postoperative evaluation.   Interpreter not needed for this visit.     Subjective    HISTORY OF PRESENT ILLNESS  Wendy underwent an uncomplicated hysteroscopy/D&C with Dr. Delorse Lek on 09/02/20, she was discharged from PACU.    Operative Findings:   Exam under anesthesia revealed normal external genitalia, vagina with decreased rugation and small cervix without abnormal lesions, partially flush with the vagina. Bimanual exam revealed slightly enlarged, mobile anteverted uterus with smooth contours though exam was limited secondary to body habitus. No adnexal masses were palpated. Hysteroscopy revealed 5 large endometrial polyps filling approximately 90% of the uterine cavity. After the polypectomy, both tubal ostia were visualized.         Pathology:  A) Endometrium, curettage:  - Weakly proliferative endometrium with features of polyp, negative for neoplasm.    Today, she reports she is doing well. She has a slightly upset stomach today. She initially had bleeding after the procedure which tapered off. The past few days she has had some more spotting that is darker. She wonders if this is coming from the rectum.     OB/GYN HISTORY   OB History   Gravida Para Term Preterm AB Living   3 2 2  0 1 2   SAB IAB Ectopic Multiple Live Births   0 0 0 0 0      # Outcome Date GA Lbr Len/2nd Weight Sex Delivery Anes PTL Lv   3 AB            2 Term            1 Term               Obstetric Comments   Menstrual NU:[id number] in Junior High. Menopause in her 19s.   Sexual history:Not currently sexually active.   Fertility plans:N/A   Cervical cancer screening QI:[id number] has not had pap smears since her 101s   History ofStI:Denies   History of sexual assault/abuse:Denies   History of trauma or severe pain with pelvic exams:NO   Breast health:Not discussed today, mammogram ordered currently by PCP   GYN  surgery:H/o D&C, CSX x2, "fibroid surgery"     Past Medical Hx, Past Surgical Hx, Family Hx, Social Hx, Medications, and Allergies reviewed with patient and are updated in epic fields.  Problem List also updated in Epic.    Objective   PHYSICAL EXAM  There were no vitals taken for this visit.  General: healthy, alert, no distress.  Respiratory: Normal respiratory effort and chest wall movement with respiration.   Abdomen: Soft, non-tender. No masses or organomegaly.   Psychiatric: Mood/affect: Normal. Orientation: oriented to time, person and place  Neurologic: Gait:  Normal.  Pelvic Exam: Vagina atrophic in appearance. Cervix normal without visible lesions. Small amount of dark blood in vaginal vault. No evidence of external hemorrhoids.     Assessment/Plan   ASSESSMENT/PLAN  75 year old [id number] presents for postoperative evaluation after hysteroscopy/D&C with Dr. Delorse Lek on 3/25.     #. Postoperative Care:   - Recovering appropriately, no acute concerns  - Reviewed pathology results with patient  - Advised patient bleeding should continue to subside. If increasing or bright red bleeding, would recommend scheduling an additional follow up appointment.     #. Routine GYN:  - Cervical Cancer Screening: no longer screening  - STI Testing: not indicated  -  Contraception: postmenopausal  - Breast Health: MMG ordered  - Vaccines: [] ?Gardasil, [x] ?Influenza, [x] ?COVID    Follow Up Plan: Annually or PRN    [name], MD PGY3  Patient seen and discussed with Dr. Mariea Clonts.

## 2020-09-20 ENCOUNTER — Ambulatory Visit: Payer: Medicare HMO | Attending: Obstetrics & Gynecology | Admitting: Unknown Physician Specialty

## 2020-09-20 VITALS — BP 138/61 | HR 76 | Temp 96.8°F | Ht 61.5 in | Wt 213.0 lb

## 2020-09-20 DIAGNOSIS — Z09 Encounter for follow-up examination after completed treatment for conditions other than malignant neoplasm: Secondary | ICD-10-CM | POA: Insufficient documentation

## 2020-09-20 DIAGNOSIS — N840 Polyp of corpus uteri: Secondary | ICD-10-CM | POA: Insufficient documentation

## 2020-09-20 NOTE — Progress Notes (Signed)
I saw and evaluated the patient and agree with the report above.

## 2020-09-26 ENCOUNTER — Encounter (HOSPITAL_BASED_OUTPATIENT_CLINIC_OR_DEPARTMENT_OTHER): Payer: Self-pay | Admitting: Internal Medicine

## 2020-10-05 ENCOUNTER — Encounter (HOSPITAL_BASED_OUTPATIENT_CLINIC_OR_DEPARTMENT_OTHER): Payer: Self-pay

## 2020-10-05 NOTE — Telephone Encounter (Signed)
SOCIAL WORK FOLLOW-UP NOTE    10/05/2020  Brief description: Care coordination    Patient description, presenting problem, patient / family goals: Patient is a English speaking 75 year old female, receiving medical care with the [facility].     SW outreach to pt for assistance with scheduling BHIP appointment. [facility] Psychiatry department has made 2 attempts to contact pt, and pts referral will be closed on Friday, April 29th if they do not hear back from pt.     Intervention / Outcome / Plan:   1. SW called patient and reached their voicemail. SW left a message, encouraged her to call back for assistance with schedule BHIP appointment.    2. SW sent pt a Mychart message.    SW will remain available for continued support/assistance as needed.     Loretha Brasil, MSW  [place]  [employer]  [phone]

## 2020-10-06 ENCOUNTER — Encounter (HOSPITAL_BASED_OUTPATIENT_CLINIC_OR_DEPARTMENT_OTHER): Payer: Self-pay

## 2020-10-06 ENCOUNTER — Ambulatory Visit: Payer: Medicare HMO | Attending: Neurology | Admitting: Neurology

## 2020-10-06 VITALS — Ht 61.0 in | Wt 209.0 lb

## 2020-10-06 DIAGNOSIS — I6930 Unspecified sequelae of cerebral infarction: Secondary | ICD-10-CM | POA: Insufficient documentation

## 2020-10-06 NOTE — Progress Notes (Signed)
[facility] Telemedicine Encounter    I conducted this encounter from [facility] via secure, live, face-to-face video conference with the patient. Wendy [name] was located at home with son.  Prior to the interview, the risks and benefits of telemedicine were discussed with the patient and verbal consent was obtained.      HPI:  Wendy [name] is a 75 year old R hand dominant female with right temporoparietal ischemic stroke etiology cardioembolic (newly diagnosed afib). She was last [facility] by Aldona LentoKelsey Hagstrand, ARNP on 08/18/2020.      She has a history of CAD s/p CABG and PCI, migraines with aura, parathyroidectomy 2019, and HTN. She is currently on clopidogrel (per prior notes until 11/2020 for PCI one year ago) and apixaban for afib.  She is overall doing well, reports some issues with superior left quadrant visual issues for which she has an appointment with neuro-opthalmology (Dr. Thelma BargeFrancis).  She uses a cane, mostly due to shortness of breath, reports balance and gait is stable from prior to stroke.  She does report anxiety and depression and is interested in rehab psychology referral for assistance with management.  She is tolerating apixaban well with no complaints.     Medication Compliance: yes  Symptom Recurrence: no  Cryptogenic Stroke:  no    Obstructive sleep apnea screening:              -Unexplained excessive daytime sleepiness: yes              Snoring: yes              -Resistant hypertension: no    mRS = 2  0 - No symptoms at all  1 - No significant disability despite symptoms; able to carry out all usual duties and activities    2 - Slight disability; unable to carry out all previous activities, but able to look after own affairs without assistance     3 - Moderate disability; requiring some help, but able to walk without assistance     4 - Moderately severe disability; unable to walk without assistance and unable to attend to own bodily needs without assistance     5 - Severe disability;  bedridden, incontinent and requiring constant nursing care and attention    6 - Dead     PHQ-2 = Positive  1.During the past month, have you often been bothered by feeling down, depressed, or hopeless? YES  2.During the past month, have you often been bothered by little interest or pleasure in doing things? YES    Past Medical History  Patient Active Problem List    Diagnosis Date Noted    Chronic anticoagulation [Z79.01] 08/18/2020    DISH (diffuse idiopathic skeletal hyperostosis) [M48.10] 07/20/2020    Postmenopausal bleeding [N95.0] 07/15/2020     07/15/20 pelvic U/S endometrium thickened with cystic lesions thickness 18 mm, multiple fibroids      Paroxysmal atrial fibrillation (HCC) [I48.0] 07/11/2020    Acute ischemic cerebrovascular accident (CVA) involving right middle cerebral artery territory [facility](HCC) [I63.511] 06/22/2020     Likely due to paroxysmal atrial fibrillation      Esophageal dysphagia [R13.19] 08/24/2019    Gastroesophageal reflux disease without esophagitis [K21.9] 08/24/2019    Body mass index 40.0-44.9, adult (HCC) [Z68.41] 07/16/2019    Primary osteoarthritis of right knee [M17.11] 07/16/2019    Hx of CABG [Z95.1]      CABG (2006.  LIMA-LAD, SVG-dRCA (with endarterectomy of RPDA and PL),  SeqSVG-RI-D1, SVG-D3)   Cath 2021 with lesion in SVG-Diagonal s/p PCI, occluded graft to ramus/diagonal, disease at the bifurcation of RCA graft insertion managed medically at that time as sx improving.       Essential hypertension [I10] 06/08/2019    Asthma [J45.909] 06/08/2019    Bipolar disorder without psychotic features ([place]) [F31.9] 06/08/2019    Coronary artery disease [I25.10] 06/08/2019     Multiple vessel 2006  Abnormal DSE 10/2019  Cath 11/18/19 PCI SVG-diagonal  Clopidogrel through 11/2020      Colon polyps [K63.5] 06/08/2019    Depression [F32.A] 06/08/2019    Generalized anxiety disorder [F41.1] 06/08/2019    Migraine [G43.909] 06/08/2019    Multinodular goiter [E04.2] 06/08/2019      07/15/20 thyroid U/S multiple thyroid nodules, R lobe one nodule TIRADS 4 --> per endocrine repeat U/S 1 year (07/2021)      Vitamin D deficiency [E55.9] 06/08/2019    Mixed hyperlipidemia [E78.2]      Cannot tolerate statins      Hyperparathyroidism ([place]) [E21.3]     Osteoporosis without current pathological fracture [M81.0]     Nephrolithiasis [N20.0] 04/27/2019    Non-functioning R kidney [N28.9] 01/22/2019     Due to ureteral stone      Pulmonary nodule less than 6 cm determined by computed tomography of lung [R91.1] 01/09/2019     R lung 5 mm nodule incidental finding [facility] [place])      Abnormal computed tomography of pelvis [R93.5] 01/09/2019     Probable fibroid      Diastolic dysfunction [Y40.34] 08/17/2017     Formatting of this note might be different from the original.   Normal left ventricular systolic function, ejection fraction 65 to 74%   Diastolic dysfunction - grade II (elevated filling pressures)   Dilated left atrium - mild   Degenerative mitral valve disease   Mitral annular calcification   Aortic stenosis - mild   Aortic regurgitation - moderate   Normal right ventricular systolic function    ECHO 2017      History of R parathyroidectomy (2017) [E89.2] 02/07/2016    Aortic stenosis, mild [I35.0] 01/08/2016     02/25/18 TTE Mild aortic stenosis with mild-moderate aorticregurgitation  06/25/20 TTE mild-moderate AS      Aortic regurgitation [I35.1] 01/08/2016     02/25/18 TTE mild-moderate      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      amLODIPine 10 MG tablet take 1 tablet by mouth once daily 90 tablet 1    apixaban (Eliquis) 5 MG  tablet Take 1 tablet (5 mg) by mouth 2 times a day. Blood thinner for stroke prevention. Stop aspirin after starting apixaban 180 tablet 3    clopidogrel 75 MG tablet Take 1 tablet (75 mg) by mouth daily. 30 tablet 0    isosorbide mononitrate ER 30 MG 24 hr tablet Take 1 tablet (30 mg) by mouth every morning. 90 tablet 3    lisinopril 5 MG tablet Take 1 tablet (5 mg) by mouth daily. For heart and blood pressure 90 tablet 3  metoprolol succinate ER 25 MG 24 hr tablet Take 0.5 tablets (12.5 mg) by mouth daily. Do not chew or crush. 45 tablet 3       Allergies:  Review of patient's allergies indicates:  Allergies   Allergen Reactions    Hydrochlorothiazide Skin: Itching and Throat Swelling    Lidocaine Other     Hallucinations    Penicillins Skin: Hives     Reaction in [place] (??)  Has patient had a PCN reaction causing immediate rash, facial/tongue/throat swelling, SOB or lightheadedness with hypotension: Yes  Has patient had a PCN reaction causing severe rash involving mucus membranes or skin necrosis: No  Has patient had a PCN reaction that required hospitalization: No  Has patient had a PCN reaction occurring within the last 10 years: No  If all of the above answers are "NO", then may proceed with Cephalosporin use.      Coconut Fatty Acids      Other reaction(s): Other (See Comments)  Reaction??    Latex Skin: Itching    Losartan Headache and Other     Other reaction(s): Other (See Comments), Unknown  Pt doesn't remember.  High blood pressure    Statins Headache and Other     Other reaction(s): Other (See Comments), Other (See Comments) high blood pressure, ear ache and foggy thinking        Review of Systems:    Constitutional: NEGATIVE    Eyes: NEGATIVE    Ears, Nose, Mouth, Throat: NEGATIVE    Cardiovascular: NEGATIVE    Respiratory: NEGATIVE    Gastrointestinal: NEGATIVE    Genitourinary: NEGATIVE    Musculoskeletal: NEGATIVE    Skin: NEGATIVE     Neurological: As noted in HPI above    Psychiatric: NEGATIVE     Endocrine: NEGATIVE     Hematologic/Lymphatic: NEGATIVE    Allergic/Immunologic: NEGATIVE     Family History:  No cerebrovascular disease    Social History:  Residence/Occupation - retired  EtOH - denies  Tobacco - denies   Illicits - denies     EXAM  Gen: Patient seated comfortably in chair, talkative, pleasant, in NAD.    MS: awake, alert, oriented to April 28, place, time and situation, speech fluent with no dysarthria, no paraphasic errors. Comprehension intact. Attention intact with ability to state days of week backwards.     CN: EOMI w/ no nystagmus, no diplopia, facial motor strength is full, tongue midline.    Motor: No pronator drift, no orbiting.     Coordination: FNF intact with no dysmetria.    Movement: no abnormal movements noted.    Gait/stance: Posture is normal. Gait is steady and narrow based.     Work up  CTA H/N   1. CT head: Large area of loss of gray-white differentiation within the right temporoparietal region suspicious for acute infarct concerning for acute infarct. No evidence of hemorrhagic conversion.  2. CTA head: Multiple punctate calcified lesions noted through the left MCA artery, most notable within the right M2 branch with decreased density of contrast filling distally near the region of infarct, likely reflecting nonocclusive calcified thrombus. Additional diffuse multifocal luminal irregularities involving the right greater than intracranial vessels, likely secondary to atherosclerotic disease  3. CTA neck: No high-grade stenosis, dissection or occlusion.    MRI Brain   Mild cerebral volume loss seen.  Numerous numerous punctate FLAIR hyperintensities in the periventricular and subcortical white matter consistent with microvascular ischemic change.  Diffuse T2 and FLAIR  hyperintensity is noted in the right temporal lobe with evidence of restricted diffusivity on DWI. No hypointensities encountered on SWI to suggest hemorrhagic transformation.    TTE   1.  The left ventricle is normal in size. There is mild concentric increase in  the wall thickness of the left ventricle. Global left ventricular function is  normal (biplane EF 65%). The left ventricular average longitudinal peak  systolic strain is borderline (-17%). No regional wall motion abnormalities  are present.  2. The right ventricle is normal size. The right ventricular systolic  function is normal.  3. The aortic valve is mildly calcified. There is mild to moderate valvular  aortic stenosis (Vmax 2.9 m/s, MG 20 mmHg, AVA 1.4 cm2). Mild to moderate  aortic regurgitation is present.  4. Mild mitral annular calcification is present. The mitral annular  calcification is posterior.  5. Agitated saline contrast study was negative at rest, with Valsalva, and  with cough.  6. Pulmonary artery systolic pressure could not be estimated due to an  insufficient tricuspid regurgitant jet.  7. There is no pericardial effusion.    HbA1c5.7%, fasting lipid panelT 283, TG 168, HDL 46, LDL 203, BNP174    ASSESSMENT/RECOMMENDATIONS:  1. History of stroke with residual deficit  - Referral to Rehab Counseling; Future    Ms. Racca presents with  right temporoparietal ischemic stroke etiology cardioembolic (newly diagnosed afib).  No further stroke work up is indicated.  I have placed a referral to rehab psychology. Defer to cardiology on the timing of clopidogrel use, asked patient and son to discuss at their next visit.  She will need to remain on anti-coagulation lifelong for stroke prevention.     We discussed secondary stroke prevention and modifiable risk factors. Goal blood pressure should be <130/80. Adequate glucose control to achieve a goal A1C value of less than 6.5%. We discussed the importance of not smoking and limiting alcohol consumption. She is on statin therapy for secondary stroke prevention. I encouraged healthy, well-balanced diet and at least 150 minutes of moderate physical activity a week.     I would  like to see the patient again in 6 months. Thank you for allowing me to participate in the care of this very pleasant patient.     Raylene Everts, MD, MPH  Neurology/Stroke Attending Physician    I spent a total time of 45 minutes face-to-face with the patient, of which more than 50% was spent counseling and coordinating care as outlined in this note.

## 2020-10-11 ENCOUNTER — Other Ambulatory Visit (HOSPITAL_BASED_OUTPATIENT_CLINIC_OR_DEPARTMENT_OTHER): Payer: Self-pay

## 2020-10-11 DIAGNOSIS — N200 Calculus of kidney: Secondary | ICD-10-CM

## 2020-10-12 ENCOUNTER — Encounter (HOSPITAL_BASED_OUTPATIENT_CLINIC_OR_DEPARTMENT_OTHER): Payer: Self-pay | Admitting: Internal Medicine

## 2020-10-18 ENCOUNTER — Other Ambulatory Visit (HOSPITAL_BASED_OUTPATIENT_CLINIC_OR_DEPARTMENT_OTHER): Payer: Self-pay | Admitting: Internal Medicine

## 2020-10-18 ENCOUNTER — Encounter (HOSPITAL_BASED_OUTPATIENT_CLINIC_OR_DEPARTMENT_OTHER): Payer: Self-pay | Admitting: Internal Medicine

## 2020-10-18 DIAGNOSIS — I10 Essential (primary) hypertension: Secondary | ICD-10-CM

## 2020-10-20 ENCOUNTER — Encounter (HOSPITAL_BASED_OUTPATIENT_CLINIC_OR_DEPARTMENT_OTHER): Payer: Self-pay | Admitting: Clinical

## 2020-10-20 ENCOUNTER — Ambulatory Visit: Payer: Medicare HMO | Attending: Clinical | Admitting: Clinical

## 2020-10-20 DIAGNOSIS — N950 Postmenopausal bleeding: Secondary | ICD-10-CM | POA: Insufficient documentation

## 2020-10-20 DIAGNOSIS — F419 Anxiety disorder, unspecified: Secondary | ICD-10-CM | POA: Insufficient documentation

## 2020-10-20 DIAGNOSIS — F411 Generalized anxiety disorder: Secondary | ICD-10-CM | POA: Insufficient documentation

## 2020-10-20 DIAGNOSIS — F319 Bipolar disorder, unspecified: Secondary | ICD-10-CM | POA: Insufficient documentation

## 2020-10-20 DIAGNOSIS — I63511 Cerebral infarction due to unspecified occlusion or stenosis of right middle cerebral artery: Secondary | ICD-10-CM

## 2020-10-20 DIAGNOSIS — F32A Depression, unspecified: Secondary | ICD-10-CM | POA: Insufficient documentation

## 2020-10-20 NOTE — Telephone Encounter (Signed)
Old dose requested, denied

## 2020-10-20 NOTE — Progress Notes (Signed)
BHIP INITIAL CLINICAL ASSESSMENT      CURRENT MENTAL HEALTH CONCERNS/REASON(S) FOR [name]:   Patient is a 75 year old woman who prefers to be called Wendy [name]. Wendy [name] was referred by Dr. Ronalee [name], her PCP in [place], to the [place] (MHIP/BHIP) for treatment of depression, with past diagnoses of GAD and bipolar disorder, recent R MCA CVA.    History of Present Illness: Wendy [name] is interested in counseling for a few reasons. Wendy [name] has a "mini stroke" in January and the psychological aspect of recovering from this has been hard. Wendy [name] was having cognitive difficulties after the stroke but says these have largely resolved and Wendy [name] is back to her previous baseline. Wendy [name] has been very fatigued however and has been trying to work out if this is related to depression vs medical condition(s). Wendy [name] has some anxiety. Neither depression or anxiety are as much of a concern for her, or impact her life, as much as ADD. Wendy [name] has never been diagnosed with ADD or taken medication for it. The problem is mostly with focus, scheduling and planning her day. "I did a process called [employer] for it that helped a lot." Wendy [name] is not interested in taking psychiatric medication for this or any other concern right now. Wendy [name] does not think ADD, anxiety or depression have significantly impacted her relationships or work over the course of her life. In terms of her current daily life Wendy [name] thinks Wendy [name] spends too much time watching tv. "More physical activity and socializing would help." "I don't sleep well." Wendy [name] has some anxiety about taking the bus.     PATIENT'S GOAL(S) FOR TREATMENT: Wendy [name] would like [place] to focus on planning her day, spending less time in front of the tv. Things Wendy [name] can do in the moment when anxious. Planning for an upcoming family event. "I would like homework."    PHQ9 & GAD7: Not completed today, sent via [place] HX:   HOSPITALIZATION(S):  One hospitalization in 2017 for one month in [place]. Wendy [name] was  diagnosed with bipolar I disorder with psychotic features at the time and prescribed lithium and olanzapine. There is some question about the accuracy of this diagnosis in her past medical record. 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  OUTPATIENT TREATMENT: None in the [facility] system. Wendy [name] participated in outpatient therapy in [place] but not regularly. "It was helpful." Her medical chart includes some past records from [place], including ED visits involving [place], a 05/11/16 [facility] outpatient psychiatry evaluation and mention of past care at [facility].  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   SUICIDE ATTEMPTS: Wendy [name] reports no history of SI or suicide attempts. Her brother died by suicide. "Because of my brother and what it did to our family, I couldn't do that to other people. There are days I think, 'This is too hard.' But I'm good at recognizing it and doing something different."  SAFETY CONCERNS (risk/protective factors):  No SI, HI or current safety concerns reported  FIREARMS: Not assessed  CURRENT/PAST MENTAL HEALTH MEDICATIONS: No current psychiatric medications. Wendy [name] is not interested in meds at this time. Past meds include lithium, olanzapine, depakote, abilify    MEDICAL CONCERNS:   Patient Active Problem List     Diagnosis Date Noted   . Chronic  anticoagulation [Z79.01] 08/18/2020   . DISH (diffuse idiopathic skeletal hyperostosis) [M48.10] 07/20/2020   . Postmenopausal bleeding [N95.0] 07/15/2020       07/15/20 pelvic U/S endometrium thickened with cystic lesions thickness 18 mm, multiple fibroids   . Paroxysmal atrial fibrillation ([place]) [I48.0] 07/11/2020   . Acute ischemic cerebrovascular accident (CVA) involving right middle  cerebral artery [facility]) [I63.511] 06/22/2020       Likely due to paroxysmal atrial fibrillation   . Esophageal dysphagia [R13.19] 08/24/2019   . Gastroesophageal reflux disease without esophagitis [K21.9] 08/24/2019   . Body mass index 40.0-44.9, adult ([place]) [Z68.41] 07/16/2019   . Primary osteoarthritis of right knee [M17.11] 07/16/2019   . 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  LIMA-LAD, SVG-dRCA (with endarterectomy of RPDA and PL), SeqSVG-RI-D1, SVG-D3)   Cath 2021 with lesion in SVG-Diagonal s/p PCI, occluded graft to ramus/diagonal, disease at the bifurcation of RCA graft insertion managed medically at that time as sx improving.    . Essential hypertension [I10] 06/08/2019   . Asthma [J45.909] 06/08/2019   . Bipolar disorder without psychotic features ([place]) [F31.9] 06/08/2019   . Coronary artery disease [I25.10] 06/08/2019       Multiple vessel 2006  Abnormal DSE 10/2019  Cath 11/18/19 PCI SVG-diagonal  Clopidogrel through 11/2020   . Colon polyps [K63.5] 06/08/2019   . Depression [F32.A] 06/08/2019   . Generalized anxiety disorder [F41.1] 06/08/2019   . Migraine [G43.909] 06/08/2019   . Multinodular goiter [E04.2] 06/08/2019       07/15/20 thyroid U/S multiple thyroid nodules, R lobe one nodule TIRADS 4 --> per endocrine repeat U/S 1 year (07/2021)   . Vitamin D deficiency [E55.9] 06/08/2019   . Mixed hyperlipidemia [E78.2]         Cannot tolerate statins   . Hyperparathyroidism ([place]) [E21.3]     . Osteoporosis without current pathological fracture [M81.0]     . Nephrolithiasis [N20.0] 04/27/2019   . Non-functioning R kidney [N28.9] 01/22/2019       Due to ureteral stone   . Pulmonary nodule less than 6 cm determined by computed tomography of lung [R91.1] 01/09/2019       R lung 5 mm nodule incidental finding [facility] [place])   . Abnormal computed tomography of pelvis [R93.5] 01/09/2019       Probable fibroid   . Diastolic dysfunction [K99.83] 08/17/2017       Formatting of this note might be different from  the original.   Normal left ventricular systolic function, ejection fraction 65 to 38%   Diastolic dysfunction - grade II (elevated filling pressures)   Dilated left atrium - mild   Degenerative mitral valve disease   Mitral annular calcification   Aortic stenosis - mild   Aortic regurgitation - moderate   Normal right ventricular systolic function    ECHO 2017     . History of R parathyroidectomy (2017) [E89.2] 02/07/2016   . Aortic stenosis, mild [I35.0] 01/08/2016       02/25/18 TTE Mild aortic stenosis with mild-moderate aortic regurgitation  06/25/20 TTE mild-moderate AS   . Aortic regurgitation [I35.1] 01/08/2016       02/25/18 TTE mild-moderate   . 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 History of R metatarsal fractures [Z87.81] 2017       2017 R 2nd, 3rd, 4th proximal metatarsals       SUBSTANCE USE/ABUSE:  None    SOCIAL/FAMILY HISTORY:  Pt. was born and raised in [place], [place]. Her father died when Wendy [name] was an infant. Her mother was often working. Wendy [name] moved to [facility] after high school where Wendy [name] lived most of her adult life. Wendy [name] moved to [facility] in Oct 2020 when Wendy [name] was having kidney problems and her daughter, who lives here, wanted her close.   MARITAL STATUS/RELATIONSHIPS: Divorced, currently single. Wendy [name] has two kids. Her daughter, a Education officer, museum, lives 5 minutes away from her and her son lives 10 minutes away. Wendy [name] lives in [place] and has become friends with several neighbors. They get together to talk in the lobby, Wendy [name] participates in a laughing group, and the building sometimes organizes outings.   EDUCATION:  2 classes short of a graduate degree in rehab counseling.  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 Wendy [name] retired a year  after having a heart bypass.  MILITARY HX: Not assessed  SIGNIFICANT EVENTS/TRAUMA HX: Not assessed  LEGAL HX: Not assessed  FAMILY HX OF PSYCH.SUBSTANCE ABUSE:  Mother was diagnosed with bipolar disorder. Brother died by suicide.  RACE/CULTURE/ETHNICITY/RELIGION: [place] was raised Mormon. At 75yo Wendy [name] joined the [employer]. Through the pandemic Wendy [name] was part of a phone prayer ring which was very helpful.    ADLS:  Eating Habits: Wendy [name] says Wendy [name] needed some help with meal planning and preparation. Now some friends (her son's mother-in-law and father-in-law) come over once a week. They plan and cook meals for the week together, talk and play cards.  Sleeping Habits: Poor sleep  Self-Care: Has home health care. Per medical record her son helps her manage her medications and appointments.   Exercise Habits: Not assessed  Concentration: Poor per self report    MENTAL STATUS:  ORIENTATION: A&O x 3    ATTITUDE:  Attentive, engaged  APPEARANCE: Appropriate hygiene and grooming;    PSYCHOMOTOR: WNL  AFFECT:  Appropriate , congruent to topic  MOOD:  Euthymic  SUICIDALITY: Denies  HOMICIDALITY: Denies    SPEECH: Appropriate Rate and Rhythm  THOUGHT PROCESS: Linear and Sequential  THOUGHT CONTENT:  Appropriate (no evidence of symptoms of psychosis)  COGNITION:  Within normal limits  JUDGEMENT:  Good  INSIGHT: Good    STRESSORS, STRENGTHS, RESOURCES:  Stressors: Stroke earlier this year  Strengths: Strong sense of self efficacy, good family support, has developed a community network despite having moved to [place] in the middle of the pandemic  Resources: Son and daughter  Interests/Hobbies: Not assessed    BARRIERS TO TREATMENT: None identified    Impression:   Jiselle is a 75 year old woman presenting with feelings of anxiety, rumination, poor sleep, less daily activity that Wendy [name] would like, difficulty with focus and structuring her time, which Wendy [name] characterizes as ADD (no formal diagnosis of this in the past). Wendy [name] agrees with  past diagnoses of anxiety and depression though Wendy [name] identifies these as a more minor concern. Wendy [name] had some cognitive difficulty following her stroke in January and reports this has largely resolved. Wendy [name] needs some support with ADLs, needs are being met between family, friends and home health. 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 Can continue to clarify diagnoses over time.    DSM5 Diagnoses: Anxiety and depression unspecified (provisional diagoses). (R/o ADD, bipolar I vs history of psychosis due to a medical condition)  Intervention/Plan: Wendy [name] will enroll in BHIP, appointments every other week for up to a year (May of 2023).   Wendy [name] is not interested in medication or meeting with a psychiatrist at this time. In the future if Wendy [name] and her team do consider medication this should be done with psychiatric consultation given her history (Dr. Georgette Dover in [place]).   Anhelica would like to have homework. In the past a home health worker suggested Wendy [name] keep a daily log of who Wendy [name] talked to and what about. Wendy [name] resisted strongly but once Wendy [name] started doing it found it helpful. Wendy [name] plans to do this over the next two weeks.  Care Coordinator discussed [facility] [facility]) and treatment alternatives.   No current psychiatric medication.  Substance use interventions (if applicable): N/A  Patient will follow-up with: Care Coordinator in 14 Days    Long term goals and Prognosis: Likely to benefit from outpatient therapy    Anticipated Treatment Duration: Up to one year    The patient is able to participate in treatment and consents to the [facility] treatment plan.

## 2020-10-27 ENCOUNTER — Encounter (HOSPITAL_BASED_OUTPATIENT_CLINIC_OR_DEPARTMENT_OTHER): Payer: Self-pay | Admitting: Cardiovascular Disease

## 2020-10-27 ENCOUNTER — Ambulatory Visit: Payer: Medicare HMO | Attending: Cardiovascular Disease | Admitting: Cardiovascular Disease

## 2020-10-27 DIAGNOSIS — I2510 Atherosclerotic heart disease of native coronary artery without angina pectoris: Secondary | ICD-10-CM | POA: Insufficient documentation

## 2020-10-27 DIAGNOSIS — I480 Paroxysmal atrial fibrillation: Secondary | ICD-10-CM

## 2020-10-27 MED ORDER — ISOSORBIDE MONONITRATE ER 60 MG OR TB24
60.0000 mg | EXTENDED_RELEASE_TABLET | Freq: Every morning | ORAL | 6 refills | Status: DC
Start: 2020-10-27 — End: 2021-04-25

## 2020-10-27 MED ORDER — METOPROLOL SUCCINATE ER 25 MG OR TB24
25.0000 mg | EXTENDED_RELEASE_TABLET | Freq: Every day | ORAL | 6 refills | Status: DC
Start: 2020-10-27 — End: 2020-12-23

## 2020-10-27 NOTE — Patient Instructions (Signed)
In order to try to reduce the chest pressure, we are increasing your Metoprolol to 25 mg a day (from 12.5), and also increasing your Isosorbide mononitrate to 60 mg a day.      You may stop Clopidogrel/Plavix June 20th or so.    Let me know how your BP and symptoms respond to these changes.  I would like your systolic (first number) BP to be <130 mmHg almost all the time, unless that make you dizzy or feel badly which I don't think it will.    I will be interested in the results of the sleep clinic evaluation.    We will try to get you into the Lipid clinic.

## 2020-10-27 NOTE — Progress Notes (Signed)
Cardiology Follow-up    CC: Follow-up CAD/PCI.    Interval/HISTORY OF PRESENT ILLNESS:  Since I last saw her in 07/2020, Wendy [name] has been doing ok overall.  She has no palpitations. She has some limitation due to chest pressure/dyspnea, but it's not as bad as it was before her PCI and may be a bit better w/ the addition of Imdur.    Has been having some of her DOE, she slows down and takes a few minutes of rest and then it's ok.  No syncope, Orthopnea, she has a sleep study pending.  She hasn't been to Lipid clinic yet b/c of difficulty getting in touch with her.    Past History: Wendy [name] is a 75 y/o W with h/o CAD, s/p CABG (2006.  LIMA-LAD, SVG-dRCA (with endarterectomy of RPDA and PL), SeqSVG-RI-D1, SVG-D3).  Back in 2006 she had DOE and was worked up for CAD, ultimately had a cath and then CABG.  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  She has a h/o statin intolerance.  More recently she has developed DOE and has been limiting her activity.  As a result her PCP, Dr. Ronalee Red, ordered a DSE.  This showed inducible ischemia and reproduced her symptoms.  This led to PCI of SVG-->D1.  THis relieved her sx and allowed her to enjoy her dtrs wedding.  She did CR.  She then suffered a CVA and was dx on Afib and started on Apixaban.  I saw her in 07/2020 after this.      PAST MEDICAL HISTORY/PROBLEM LIST:  Patient Active Problem List    Diagnosis Date Noted   . Chronic anticoagulation [Z79.01] 08/18/2020   . DISH (diffuse idiopathic skeletal hyperostosis) [M48.10] 07/20/2020   . Postmenopausal bleeding [N95.0] 07/15/2020     07/15/20 pelvic U/S endometrium thickened with cystic lesions thickness 18 mm, multiple fibroids     . Paroxysmal atrial fibrillation ([place]) [I48.0] 07/11/2020   . History of R MCA cerebrovascular accident (CVA) with residual deficit [I69.30] 06/22/2020     06/22/20 R MCA cardioembolic CVA likely due to  paroxysmal atrial fibrillation     . Esophageal dysphagia [R13.19] 08/24/2019   . Gastroesophageal reflux disease without esophagitis [K21.9] 08/24/2019   . Body mass index 40.0-44.9, adult ([place]) [Z68.41] 07/16/2019   . Primary osteoarthritis of right knee [M17.11] 07/16/2019   . Hx of CABG [Z95.1]      CABG (2006.  LIMA-LAD, SVG-dRCA (with endarterectomy of RPDA and PL), SeqSVG-RI-D1, SVG-D3)   Cath 2021 with lesion in SVG-Diagonal s/p PCI, occluded graft to ramus/diagonal, disease at the bifurcation of RCA graft insertion managed medically at that time as sx improving.      . Essential hypertension [I10] 06/08/2019   . Asthma [J45.909] 06/08/2019   . Bipolar disorder without psychotic features ([place]) [F31.9] 06/08/2019   . Coronary artery disease [I25.10] 06/08/2019     Multiple vessel 2006  Abnormal DSE 10/2019  Cath 11/18/19 PCI SVG-diagonal  Clopidogrel through 11/2020     . Colon polyps [K63.5] 06/08/2019   . Depression [F32.A] 06/08/2019   . Generalized anxiety disorder [F41.1] 06/08/2019   . Migraine [G43.909] 06/08/2019   . Multinodular goiter [E04.2] 06/08/2019     07/15/20 thyroid U/S multiple thyroid nodules, R lobe one nodule TIRADS 4 --> per endocrine repeat U/S 1 year (07/2021)     . Vitamin D deficiency [E55.9] 06/08/2019   . Mixed hyperlipidemia [E78.2]  Cannot tolerate statins     . Hyperparathyroidism ([place]) [E21.3]    . Osteoporosis without current pathological fracture [M81.0]    . Nephrolithiasis [N20.0] 04/27/2019   . Non-functioning R kidney [N28.9] 01/22/2019     Due to ureteral stone     . Pulmonary nodule less than 6 cm determined by computed tomography of lung [R91.1] 01/09/2019     R lung 5 mm nodule incidental finding [facility] [place])     . Abnormal computed tomography of pelvis [R93.5] 01/09/2019     Probable fibroid     . Diastolic dysfunction [K16.01] 08/17/2017     Formatting of this note might be different from the original.   Normal left ventricular systolic function, ejection fraction  65 to 09%   Diastolic dysfunction - grade II (elevated filling pressures)   Dilated left atrium - mild   Degenerative mitral valve disease   Mitral annular calcification   Aortic stenosis - mild   Aortic regurgitation - moderate   Normal right ventricular systolic function    ECHO 2017     . History of R parathyroidectomy (2017) [E89.2] 02/07/2016   . Aortic stenosis, mild [I35.0] 01/08/2016     02/25/18 TTE Mild aortic stenosis with mild-moderate aorticregurgitation  06/25/20 TTE mild-moderate AS     . Aortic regurgitation [I35.1] 01/08/2016     02/25/18 TTE mild-moderate     . 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 History of R metatarsal fractures [Z87.81] 2017     2017 R 2nd, 3rd, 4th proximal metatarsals       ALL:  Review of patient's allergies indicates:  Allergies   Allergen Reactions   . Hydrochlorothiazide Skin: Itching and Throat Swelling   . Lidocaine Other     Hallucinations   . Penicillins Skin: Hives     Reaction in [place] (??)  Has patient had a PCN reaction causing immediate rash, facial/tongue/throat swelling, SOB or lightheadedness with hypotension: Yes  Has patient had a PCN reaction causing severe rash involving mucus membranes or skin necrosis: No  Has patient had a PCN reaction that required hospitalization: No  Has patient had a PCN reaction occurring within the last 10 years: No  If all of the above answers are "NO", then may proceed with Cephalosporin use.     . Coconut Fatty Acids      Other reaction(s): Other (See Comments)  Reaction??   . Latex Skin: Itching   . Losartan Headache and Other     Other reaction(s): Other (See Comments), Unknown  Pt doesn't remember.  High blood pressure   . Statins Headache and Other     Other reaction(s): Other (See Comments), Other (See Comments) high blood pressure, ear ache and foggy thinking      MEDS:  Outpatient Medications Prior to Visit   Medication Sig Dispense Refill   . acetaminophen 500 MG tablet Take 500 mg by  mouth every 6 hours as needed.     Marland Kitchen amLODIPine 10 MG tablet take 1 tablet by mouth once daily 90 tablet 1   . apixaban (Eliquis) 5 MG tablet Take 1 tablet (5 mg) by mouth 2 times a day. Blood thinner for stroke prevention. Stop aspirin after starting apixaban 180 tablet 3   . clopidogrel 75 MG tablet Take 1 tablet (75 mg) by mouth daily. 30 tablet 0   . isosorbide mononitrate ER 30 MG 24 hr tablet Take 1 tablet (30 mg) by mouth every morning.  90 tablet 3   . lisinopril 5 MG tablet Take 1 tablet (5 mg) by mouth daily. For heart and blood pressure 90 tablet 3   . metoprolol succinate ER 25 MG 24 hr tablet Take 0.5 tablets (12.5 mg) by mouth daily. Do not chew or crush. 45 tablet 3     No facility-administered medications prior to visit.     PHYSICAL EXAM:  Vitals:    10/27/20 1435   BP: (!) 145/63   BP Cuff Size: Regular Long   BP Site: Left Arm   BP Position: Sitting   Pulse: 76   Temp: (!) 35.6 C   TempSrc: Temporal   SpO2: 97%   Weight: 95.7 kg (211 lb)     Gen: WD, WN woman, NAD  E: Anicteric  HENT: Mask not removed.  CV: RRR SEM, soft diastolic murmur in aortic area, PMI not displaced, no LEE.  P: Normal M & A  S: No rashes    OTHER DATA:    Results for orders placed or performed in visit on 46/96/29   Basic Metabolic Panel   Result Value Ref Range    Sodium 142 135 - 145 meq/L    Potassium 4.1 3.6 - 5.2 meq/L    Chloride 105 98 - 108 meq/L    Carbon Dioxide, Total 30 22 - 32 meq/L    Anion Gap 7 4 - 12    Glucose 91 62 - 125 mg/dL    Urea Nitrogen 13 8 - 21 mg/dL    Creatinine 1.02 0.38 - 1.02 mg/dL    Calcium 9.6 8.9 - 10.2 mg/dL    eGFR by CKD-EPI 55 (L) >59 mL/min/[1.73_m2]     DSE 11/03/2019:  1. Patient developed chest pain at peak stress, described to be similar to  the pain she felt before having her CABG, that resolved during recovery.   2. Baseline ECG shows normal sinus rhythm with nonspecific ST/T-wave changes.  At peak stress, there are inferior (III, aVF) ST elevations with reciprocal  ST  depressions in leads I and aVL.   3. Baseline TTE shows normal LV size and systolic function (biplane EF 65%)  with no regional wall motion abnormalities. There is mild mitral annular  calcification, mild-moderate aortic stenosis, and mild-moderate aortic regurgitation.  4. At peak stress, there is hypokinesis to akinesis in the myocardial  segments as diagrammed.   5. In summary, there is ECG and echocardiographic evidence of inducible ischemia.    11/18/19:  Impression:  1. Successful IVUS guided PCI of SVG-diagonal with 3.0(30) mm Resolute Onyx DES.  2. Diagnostic angiography demonstrating severe disease in SVG-diagonal and SVG-rPDA, occluded SVG-RI-diagonal, and patent LIMA-LAD.  3. Successful ultrasound guided 13F RFA access.  4. Perclose to the RFA.    Recommendations:  1. Patient initially loaded with ticagrelor 180 mg intraprocedure. Reloaded with 300 mg Plavix PO prior to discharge this evening. Continue Plavix 75 mg daily for at least 1 year.  2. Continue ASA 81 mg daily indefinitely.  3. Follow up visits with Dr. Bridgett Larsson  in 2 weeks to discuss further interventions (CTO PCI of ramus, CTO PCI of RCA) and we can rediscuss with her if clinically indicated.    TTE 06/23/20:  1. The left ventricle is normal in size. There is mild concentric increase in the wall thickness of the left ventricle. Global left ventricular function is normal (biplane EF 65%). The left ventricular average longitudinal peak  systolic strain is borderline (-17%). No regional  wall motion abnormalities are present.  2. The right ventricle is normal size. The right ventricular systolic function is normal.  3. The aortic valve is mildly calcified. There is mild to moderate valvular aortic stenosis (Vmax 2.9 m/s, MG 20 mmHg, AVA 1.4 cm2). Mild to moderate aortic regurgitation is present.  4. Mild mitral annular calcification is present. The mitral annular calcification is posterior.  5. Agitated saline contrast study was negative at rest, with  Valsalva, and with cough.  6. Pulmonary artery systolic pressure could not be estimated due to an insufficient tricuspid regurgitant jet.  7. There is no pericardial effusion.    Compared with the baseline TTE from 11/03/2019, no significant changes are noted.    IMPRESSION/RECOMMENDATIONS: 75 y/o W with CAD s/p CABG with recurrent anginal equivalent DOE and abnormal DSE now s/p PCI to SVG-->Diagonal on 11/18/19.    CAD: Seems stable, but may be having some exertional angina.  Will increase Metop to 25 qd and increase Imdur (to 60qd). Stop Plavix 11/28/2020.  If sx don't improve w/ increased antianginals and better BP control, may do a stress prior to re-referral for cath/PCI.    HTN: Above goal of < 757 mmHg systolic.  Will see what happens when Imdur & Metop are increased.  She has more room on Metop from HR point of view and could also increase ACEi, although not an antianginal per se.    AS/AR: Both mild-mod on 06/23/20.  Next TTE in 2-3 years.    Lipids.  Statin intolerant.  Will try to help get her Lipid clinic appt (phone issue).    Follow-up 6 mo. Or sooner prn.    I spent a total of 30 minutes for the patient's care on the date of the service.

## 2020-10-31 ENCOUNTER — Ambulatory Visit (HOSPITAL_BASED_OUTPATIENT_CLINIC_OR_DEPARTMENT_OTHER): Payer: Medicare HMO | Admitting: Ophthalmology

## 2020-11-03 ENCOUNTER — Ambulatory Visit: Payer: Medicare HMO | Attending: Clinical | Admitting: Clinical

## 2020-11-03 DIAGNOSIS — F419 Anxiety disorder, unspecified: Secondary | ICD-10-CM

## 2020-11-03 DIAGNOSIS — F32A Depression, unspecified: Secondary | ICD-10-CM | POA: Insufficient documentation

## 2020-11-03 DIAGNOSIS — I63511 Cerebral infarction due to unspecified occlusion or stenosis of right middle cerebral artery: Secondary | ICD-10-CM | POA: Insufficient documentation

## 2020-11-03 DIAGNOSIS — N950 Postmenopausal bleeding: Secondary | ICD-10-CM | POA: Insufficient documentation

## 2020-11-03 DIAGNOSIS — F411 Generalized anxiety disorder: Secondary | ICD-10-CM | POA: Insufficient documentation

## 2020-11-03 DIAGNOSIS — F319 Bipolar disorder, unspecified: Secondary | ICD-10-CM | POA: Insufficient documentation

## 2020-11-03 NOTE — Progress Notes (Signed)
BHIP FOLLOW-UP NOTE      Type of Service: Psychotherapy  Duration of session: 46min     Distant Site Telemedicine Encounter  I conducted this encounter from [facility] via secure, live, face-to-face video conference with the patient. Wendy [name] was located at home (see address in Epic).  I have reviewed the risks and benefits of telemedicine as pertinent to this visit and the patient agreed to proceed.  Patient's location during encounter: Home, see address in [facility]  Emergency contact name and telephone number reviewed in patient chart and is up to date: Emergency contact and LNOK are listed in patient chart. Not reviewed with patient today.  In addition to the patient and the provider, the following others were present during the encounter: None  Prior to initial interview, the provider verified the patient's identity by asking for his or her name and date of birth. The provider informed the patient of their physical location and showed his or her badge. No recordings are kept from this encounter.   Emergency plan:  In the event of an emergency, the provider may ask the patient and/or family member/caregiver to contact 911. If it is not possible for the patient or someone at their location to contact 911, the provider will contact 911 and provide the patient's location. The patient has been informed of this safety plan and verbally consented to it.   This visit was conducted via telehealth instead of face to face because of the risk of COVID-19 exposure inherent in being physically present in the company of others.     CURRENT MENTAL HEALTH CONCERNS/REASON(S) FOR VISIT:   Patient is a 75 year old woman who prefers to be called Wendy [name]. She was referred by Dr. 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    Interval History:  Wendy [name] says she would  like to have her son join for the first 5-10 minutes of our appointments (all by telemedicine) moving forward. He would be dialing in separately from his home. He has access to her MyChart since he helps her manage her medical care and appointments. We agree I will confirm with my department if he can use the same link to log in from his home or if we'll need to generate him a separate link. Wendy [name] says it is fine for me to schedule appointments directly with her at the end of each visit.    Wendy [name] and her son had a disagreement. She says she and her son love each other but tiffs happen sometimes because of different personality styles. It's important to her not to be a burden on her son.    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 She feels criticized, judged, more by other people than by herself. "I know I'm enough."    PHQ9: 4 (completed 10/30/20 on MyChart)  GAD7: Not completed    Risk Assessment: No SI, HI or current safety concerns reported    Assessment of Progress or Regression and Expected Treatment Outcomes: Patient would benefit from continued treatment    DSM5 Diagnoses: Anxiety and depression unspecified (provisional diagoses). (r/o bipolar I with psychotic features vs history of psychosis due to a medical condition)    Intervention/Plan:  Wendy [name] didn't do her planned homework over  the last two weeks, she's not sure what got in the way. We agree to try something different. For the next two weeks she wants to try walking every day. (There is no amount required, even walking for just one minute is fine, though she's welcome to do more if she likes.) Her homework is to notice what thoughts get in the way of doing this. And to practice noticing without judgement.   Care Coordinator discussed [facility]) and treatment alternatives.   No current psychiatric medication. If patient and her team  decide to try psychiatric meds in the future she should be scheduled with [facility] Dr. Georgette [name], given some complicated experiences in the past with psychiatric meds.   Counseling interventions: Supportive Psychotherapy   Substance use interventions (if applicable): None  Patient will follow-up with: Care Coordinator in 14 Days    The patient is able to participate in treatment and consents to the BHIP treatment plan.

## 2020-11-08 ENCOUNTER — Encounter (HOSPITAL_BASED_OUTPATIENT_CLINIC_OR_DEPARTMENT_OTHER): Payer: Self-pay

## 2020-11-08 ENCOUNTER — Encounter (HOSPITAL_BASED_OUTPATIENT_CLINIC_OR_DEPARTMENT_OTHER): Payer: Self-pay | Admitting: Clinical

## 2020-11-17 ENCOUNTER — Ambulatory Visit: Payer: Medicare HMO | Attending: Clinical | Admitting: Clinical

## 2020-11-17 DIAGNOSIS — F411 Generalized anxiety disorder: Secondary | ICD-10-CM | POA: Insufficient documentation

## 2020-11-17 DIAGNOSIS — N950 Postmenopausal bleeding: Secondary | ICD-10-CM | POA: Insufficient documentation

## 2020-11-17 DIAGNOSIS — I63511 Cerebral infarction due to unspecified occlusion or stenosis of right middle cerebral artery: Secondary | ICD-10-CM | POA: Insufficient documentation

## 2020-11-17 DIAGNOSIS — F419 Anxiety disorder, unspecified: Secondary | ICD-10-CM | POA: Insufficient documentation

## 2020-11-17 DIAGNOSIS — F319 Bipolar disorder, unspecified: Secondary | ICD-10-CM | POA: Insufficient documentation

## 2020-11-17 DIAGNOSIS — F32A Depression, unspecified: Secondary | ICD-10-CM | POA: Insufficient documentation

## 2020-11-17 NOTE — Progress Notes (Signed)
BHIP FOLLOW-UP NOTE      Type of Service: Psychotherapy  Duration of session: 27min     Distant Site Telemedicine Encounter  {Vanishing  We made a change - Learn & Vote Up  Down (expires 11/29/20):999}   I conducted this encounter via secure, live, face-to-face video conference with the patient. I reviewed the risks and benefits of telemedicine as pertinent to this visit and the patient agreed to proceed.    Provider Location: On-site location (clinic, hospital, on-site office) [place]  Patient Location: At home  Present with patient: No one else present   Patient's location during encounter: Home, see address in [facility]  Emergency contact name and telephone number reviewed in patient chart and is up to date: Emergency contact and LNOK are listed in patient chart. Not reviewed with patient today.  In addition to the patient and the provider, the following others were present during the encounter: None  Prior to initial interview, the provider verified the patient's identity by asking for his or her name and date of birth. The provider informed the patient of their physical location and showed his or her badge. No recordings are kept from this encounter.  Emergency plan:  In the event of an emergency, the provider may ask the patient and/or family member/caregiver to contact 911. If it is not possible for the patient or someone at their location to contact 911, the provider will contact 911 and provide the patient's location. The patient has been informed of this safety plan and verbally consented to it.   This visit was conducted via telehealth instead of face to face because of the risk of COVID-19 exposure inherent in being physically present in the company of others.     CURRENT MENTAL HEALTH CONCERNS/REASON(S) FOR VISIT:   Patient is a 75 year old woman who prefers to be called Wendy [name]. She wasreferred by Dr. 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    Interval History:  Wendy [name] has been challenging herself to walk each day, "to at least go downstairs." Her neighbor has been pushing her which helps. Her building has activities scheduled every day, she's been participating in some of them. She's been thinking about habits, judgement, how her medical conditions impact self-trust. Nikea's son's in-laws are going to stop coming over to help her. "I need help but I also feel undermined by it." This doesn't feel like a black and white issue, she enjoys their company and playing cards with them, says it was a lot of time and effort on their part. "She didn't feel appreciated. I felt judged." She sees both sides.    PHQ9: Not completed  GAD7: Not completed  Sent via MyChart    Risk Assessment: No SI, HI or current safety concerns reported    Assessment of Progress or Regression and Expected Treatment Outcomes: Patient would benefit from continued treatment    DSM5Diagnoses:Anxiety and depression unspecified (provisional diagoses). (r/o bipolar I with psychotic features vs history of psychosis due to a medical condition)    Intervention/Plan:  Over the next two weeks Wendy [name] wants to reflect on her habits, what habits she wants to develop or change and why.  Wendy [name] is thinking about driving again. She stopped after the stroke. She doesn't have any appointments on the books with a medical provider apart from the  sleep clinic. She may schedule an appointment to discuss this.   Care Coordinator discussed [facility]) and treatment alternatives.   No current psychiatric medication. If patient and her team decide to try psychiatric meds in the future she should be scheduled with [facility] psychiatrist Dr. Georgette Dover, given some complicated experiences in the past with psychiatric meds.   Counseling interventions: Supportive  Psychotherapy   Substance use interventions (if applicable): None  Patient will follow-up with:Care Coordinatorin 14 Days    The patient is able to participate in treatment and consents to the [facility] treatment plan.

## 2020-11-18 ENCOUNTER — Encounter (HOSPITAL_BASED_OUTPATIENT_CLINIC_OR_DEPARTMENT_OTHER): Payer: Self-pay | Admitting: Cardiovascular Disease

## 2020-11-21 NOTE — Telephone Encounter (Signed)
[facility] please contact Ms. Eastep or son to schedule follow up appointment including driving safety evaluation thanks.

## 2020-11-24 NOTE — Telephone Encounter (Signed)
Attempted to call [facility] but it went straight to VM. Left msg requesting call back to schedule f/u + safety eval. Direct CB# provided.

## 2020-12-01 ENCOUNTER — Ambulatory Visit: Payer: Medicare HMO | Attending: Clinical | Admitting: Clinical

## 2020-12-01 DIAGNOSIS — I63511 Cerebral infarction due to unspecified occlusion or stenosis of right middle cerebral artery: Secondary | ICD-10-CM | POA: Insufficient documentation

## 2020-12-01 DIAGNOSIS — F411 Generalized anxiety disorder: Secondary | ICD-10-CM | POA: Insufficient documentation

## 2020-12-01 DIAGNOSIS — F419 Anxiety disorder, unspecified: Secondary | ICD-10-CM | POA: Insufficient documentation

## 2020-12-01 DIAGNOSIS — F32A Depression, unspecified: Secondary | ICD-10-CM | POA: Insufficient documentation

## 2020-12-01 DIAGNOSIS — N950 Postmenopausal bleeding: Secondary | ICD-10-CM | POA: Insufficient documentation

## 2020-12-01 DIAGNOSIS — F319 Bipolar disorder, unspecified: Secondary | ICD-10-CM | POA: Insufficient documentation

## 2020-12-01 NOTE — Progress Notes (Signed)
BHIP FOLLOW-UP NOTE   Type of Service:Psychotherapy  Duration of session:5min  Distant Site Telemedicine Encounterpires 11/29/20):999}   I conducted this encounter via secure, live, face-to-face video conference with the patient. I reviewed the risks and benefits of telemedicine as pertinent to this visit and the patient agreed to proceed.    Provider Location: On-site location (clinic, hospital, on-site office) [place]  Patient Location: At home  Present with patient: No one else present   Patient's location during encounter:Home, see address in [facility]  Emergency contact name and telephone number reviewed in patient chart and is up to date:Emergency contact is listed in patient chart. Not reviewed with patient today.  In addition to the patient and the provider, the following others were present during the encounter:None  Prior toinitialinterview, the provider verified the patient's identity by asking for his or her name and date of birth. The provider informed the patient of their physical location and showed his or her badge. No recordings are kept from this encounter.  Emergency plan:  In the event of an emergency, the provider may ask the patient and/or family member/caregiver to contact 911. If it is not possible for the patient or someone at their location to contact 911, the provider will contact 911 and provide the patient's location. The patienthas beeninformed of this safety plan and verbally consented to it.   This visit was conducted via telehealth instead of face to face because of the risk of COVID-19 exposure inherent in being physically present in the company of others.    CURRENT MENTAL HEALTH CONCERNS/REASON(S) FOR VISIT:  Patient is a 75 year old woman who prefers to be called Wendy [name]. She wasreferred by Dr. Ronalee Red, [facility] PCP in [facility]for treatment of depression, with past diagnoses of GAD and Bipolar I disorder with psychotic features vs Psychotic disorder due to a  medical condition, recent R MCA CVA.    Interval History:Wendy [name] has been thinking about habits, has a part of her that reacts negatively to the idea of habits, another part that thinks she wants to work on them. She participated in more activities in the building over the last few weeks. She thinks that she can be, "needy, wanting others to solve my issues," and was able to push through that this week and complete the task she was working on herself which felt good.     PHQ9: Not completed  GAD7: Not completed  Sent via MyChart    Risk Assessment:No SI, HI or current safety concerns reported    Assessment of Progress or Regression and Expected Treatment Outcomes:Patient would benefit from continued treatment    DSM5Diagnoses:Anxiety and depression unspecified (provisional diagoses). (r/o bipolar Iwith psychotic featuresvs history of psychosis due to a medical condition)    Intervention/Plan:[name]'s goals for the next two weeks include journaling more about her life history, going through old pictures like her son requested, cooking her own meals, doing her own grocery shopping, going once a day to the exercise room in her building, finding someone to date. She narrows this down to cooking and grocery shopping as priorities, the others being extra. Next appt complete PHQ/GAD  Care Coordinator discussed [facility]) and treatment alternatives.  No currentpsychiatric medication. If patient and her team decide to try psychiatric meds in the future she should be scheduled with [facility] psychiatrist Dr. Georgette Dover, given some complicated experiences in the past with psychiatric meds.  Counseling interventions:Supportive Psychotherapy  Substance use interventions (if applicable):None  Patient will  follow-up with:Care Coordinatorin 14 Days    The patient is able to participate in treatment and consents to the [facility] treatment plan.

## 2020-12-13 ENCOUNTER — Encounter (HOSPITAL_BASED_OUTPATIENT_CLINIC_OR_DEPARTMENT_OTHER): Payer: Self-pay

## 2020-12-15 ENCOUNTER — Ambulatory Visit: Payer: Medicare HMO | Attending: Clinical | Admitting: Clinical

## 2020-12-15 ENCOUNTER — Other Ambulatory Visit (HOSPITAL_BASED_OUTPATIENT_CLINIC_OR_DEPARTMENT_OTHER): Payer: Self-pay | Admitting: Internal Medicine

## 2020-12-15 DIAGNOSIS — I63511 Cerebral infarction due to unspecified occlusion or stenosis of right middle cerebral artery: Secondary | ICD-10-CM | POA: Insufficient documentation

## 2020-12-15 DIAGNOSIS — F32A Depression, unspecified: Secondary | ICD-10-CM

## 2020-12-15 DIAGNOSIS — F319 Bipolar disorder, unspecified: Secondary | ICD-10-CM | POA: Insufficient documentation

## 2020-12-15 DIAGNOSIS — I2510 Atherosclerotic heart disease of native coronary artery without angina pectoris: Secondary | ICD-10-CM

## 2020-12-15 DIAGNOSIS — F419 Anxiety disorder, unspecified: Secondary | ICD-10-CM

## 2020-12-15 DIAGNOSIS — N950 Postmenopausal bleeding: Secondary | ICD-10-CM | POA: Insufficient documentation

## 2020-12-15 DIAGNOSIS — F411 Generalized anxiety disorder: Secondary | ICD-10-CM | POA: Insufficient documentation

## 2020-12-15 MED ORDER — LISINOPRIL 5 MG OR TABS
5.0000 mg | ORAL_TABLET | Freq: Every day | ORAL | 1 refills | Status: DC
Start: 2020-12-15 — End: 2021-06-21

## 2020-12-15 NOTE — Progress Notes (Signed)
BHIP FOLLOW-UP NOTE      Type of Service: Psychotherapy  Duration of session: 45min   Distant Site Telemedicine Encounterpires 11/29/20):999}   I conducted this encounter via secure, live, face-to-face video conference with the patient. I reviewed the risks and benefits of telemedicine as pertinent to this visit and the patient agreed to proceed.     Provider Location: On-site location (clinic, hospital, on-site office) [place]  Patient Location: At home  Present with patient: No one else present   Patient's location during encounter: Home, see address in EPIC  Emergency contact name and telephone number reviewed in patient chart and is up to date: Emergency contact is listed in patient chart. Not reviewed with patient today.  In addition to the patient and the provider, the following others were present during the encounter: None  Prior to initial interview, the provider verified the patient's identity by asking for his or her name and date of birth. The provider informed the patient of their physical location and showed his or her badge. No recordings are kept from this encounter.   Emergency plan:  In the event of an emergency, the provider may ask the patient and/or family member/caregiver to contact 911. If it is not possible for the patient or someone at their location to contact 911, the provider will contact 911 and provide the patient's location. The patient has been informed of this safety plan and verbally consented to it.   This visit was conducted via telehealth instead of face to face because of the risk of COVID-19 exposure inherent in being physically present in the company of others.      CURRENT MENTAL HEALTH CONCERNS/REASON(S) FOR VISIT:   Patient is a 74 year old woman who prefers to be called Wendy [name]. She was referred by Dr. 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     Interval History:  Nyah's goals for the last two weeks were to focus on cooking and grocery shopping. She went to the grocery store on the [employer] bus. Actually enjoyed the trip more than she used to when driving. She thinks goal setting has been helpful. She has been going down to the lobby more to socialize with the group of residents who spend time there. She used to go with a neighbor but is now comfortable going by herself. She's noticed a high school type dynamic in her building, thinks this is a function of age. Two other residents have talked to her about having SI. She feels she handled these conversations well. She thinks the building staff (who she alerted) also handled it well. During the recent hot day she fell asleep and left the tap running, it overflowed.     PHQ9: 8  GAD7: 2  We do these together and discuss completing them monthly, either through MyChart or together     Risk Assessment: No SI, HI or current safety concerns reported     Assessment of Progress or Regression and Expected Treatment Outcomes: Patient would benefit from continued treatment     DSM5 Diagnoses: Anxiety and depression unspecified (provisional diagoses). (r/o bipolar I with psychotic features vs history of psychosis due to a medical condition)     Intervention/Plan:  Kea thinks that, rather than the symptoms included in the PHQ9 and GAD7, a better measure of improvement for her would be "isolating less" and "being less needy." For the next two weeks her goal is to figure out a good rhythm for physical exercise and Tai Chi.  Care Coordinator discussed [facility] ([facility]) and treatment alternatives.   No   currentpsychiatric medication. If patient and her team decide to try psychiatric meds in the future she should be scheduled with [facility] psychiatrist Dr. Georgette Dover, given some complicated experiences in the past with psychiatric meds.  Counseling  interventions:Supportive Psychotherapy  Substance use interventions (if applicable):None  Patient will follow-up with:Care Coordinatorin 14 Days    The patient is able to participate in treatment and consents to the [facility] treatment plan.

## 2020-12-16 ENCOUNTER — Encounter (HOSPITAL_BASED_OUTPATIENT_CLINIC_OR_DEPARTMENT_OTHER): Payer: Self-pay | Admitting: Cardiovascular Disease

## 2020-12-19 NOTE — Telephone Encounter (Signed)
Called and left VM w/ son [name] to assist pt with scheduling an appt w/ [facility], # provided. It appears they tried to reach the pt a few times without success.

## 2020-12-19 NOTE — Telephone Encounter (Signed)
Hi there, I had referred Wendy [name] to Lipid clinic at [facility] a while back, can you see if it's in the works?  I know she's hard to get a hold of.

## 2020-12-20 ENCOUNTER — Ambulatory Visit (HOSPITAL_BASED_OUTPATIENT_CLINIC_OR_DEPARTMENT_OTHER): Payer: Medicare HMO | Admitting: Family

## 2020-12-20 NOTE — Patient Instructions (Signed)
Hello,     It was nice to meet you today. As we discussed, I am ordering a sleep study for you to complete. Getting set up for the study does require a 15 minute appointment with our sleep technologist to review how to use the equipment.     You may contact the [place] [facility] [place] at [phone] and select option #2 to be directly connected with one of our coordinators who will help you schedule this appointment.     Thank you,   Talbert Forest  Nurse Practitioner  [facility]
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
## 2020-12-21 ENCOUNTER — Telehealth (HOSPITAL_BASED_OUTPATIENT_CLINIC_OR_DEPARTMENT_OTHER): Payer: Self-pay | Admitting: Nursing

## 2020-12-21 NOTE — Telephone Encounter (Signed)
Thanks, likely stress vs. Cath.   --[facility]

## 2020-12-21 NOTE — Telephone Encounter (Signed)
LVM to triage and offer appts to address increasing SOB and CP "over the past few weeks".    Appts available -  7/14 at 8:15am w/ Dr. Gus Height   7/15 at 10:15am w/ Seme

## 2020-12-21 NOTE — Telephone Encounter (Signed)
Increasing SOB and CP over past 2-3 weeks. Pt can only walk a couple of mins before having to rest. Describes SOB as feeling like "lava lungs". Does not want to go to ED.  Seemed to be worse when it was hot last week, and son [name] bought [employer] farm manager.  Son wondering if it's not time for another cardiac workup.    RN recommended ED if sx become worse, or if Arianah feels uncomfortable managing at home.      Scheduled for appt with Seme this Friday.  Son will take pt to ED if needed.

## 2020-12-21 NOTE — Telephone Encounter (Signed)
S/O: Increased dyspnea on exertion and chest pain x2-3 weeks. Known CAD, aortic stenosis and aortic regurgitation. Also seen by cardiology Dr Bridgett Larsson.    A/P: Increased dyspnea on exertion and chest pain concern for increasing angina. Agree with RN advice and 7/15 appointment with ARNP Te[place].    Also notifying cardiology Dr Bridgett Larsson.    Thanks.

## 2020-12-23 ENCOUNTER — Ambulatory Visit (HOSPITAL_BASED_OUTPATIENT_CLINIC_OR_DEPARTMENT_OTHER): Payer: Medicare HMO | Admitting: Nursing

## 2020-12-23 ENCOUNTER — Ambulatory Visit: Payer: Medicare HMO | Attending: Adult Health | Admitting: Adult Health

## 2020-12-23 VITALS — BP 136/58 | HR 67 | Temp 97.2°F | Wt 217.0 lb

## 2020-12-23 DIAGNOSIS — R0789 Other chest pain: Secondary | ICD-10-CM

## 2020-12-23 DIAGNOSIS — I2510 Atherosclerotic heart disease of native coronary artery without angina pectoris: Secondary | ICD-10-CM

## 2020-12-23 DIAGNOSIS — R059 Cough, unspecified: Secondary | ICD-10-CM | POA: Insufficient documentation

## 2020-12-23 MED ORDER — METOPROLOL SUCCINATE ER 50 MG OR TB24
50.0000 mg | EXTENDED_RELEASE_TABLET | Freq: Every day | ORAL | 3 refills | Status: DC
Start: 2020-12-23 — End: 2021-12-29

## 2020-12-23 MED ORDER — CETIRIZINE HCL 10 MG OR TABS
10.0000 mg | ORAL_TABLET | Freq: Every day | ORAL | 0 refills | Status: DC
Start: 2020-12-23 — End: 2021-03-03

## 2020-12-23 MED ORDER — NITROGLYCERIN 0.4 MG SL SUBL
0.4000 mg | SUBLINGUAL_TABLET | SUBLINGUAL | 1 refills | Status: DC | PRN
Start: 2020-12-23 — End: 2022-01-17

## 2020-12-23 NOTE — Progress Notes (Signed)
ECG performed. Provider handed report. Reading to be scanned into media.

## 2020-12-23 NOTE — Progress Notes (Signed)
[facility] CARE CLINIC SUBSEQUENT CARE VISIT NOTE         ID/CC:  Wendy [name] is a 75 year old community-dwelling female being seen for headache, sinus congestion, earache. Last visit with me 03/17/20.     HPI:  Baseline: lives alone at [facility] (senior housing)  Accompanied today by daughter  Interpreter or not: NO     Cough   Has been happening for a month productive cough with phlegm   Ongoing issue since stroke, but has gotten more lately   Reported dry feeling throat which feels as if closing on her  Usually experiences feeling of throat closing when she eats coconut     Chest pain   Sharp pain under left breast   Extreme SOB with chest pain associated with it   Takes long time to recover from normal physical exertion   Yesterday she had to take 8 steps that caused her to be winded for over 20 minutes   Today she does not have chest pain       Current Outpatient Medications   Medication Sig Dispense Refill   . acetaminophen 500 MG tablet Take 500 mg by mouth every 6 hours as needed.     Marland Kitchen amLODIPine 10 MG tablet take 1 tablet by mouth once daily 90 tablet 1   . apixaban (Eliquis) 5 MG tablet Take 1 tablet (5 mg) by mouth 2 times a day. Blood thinner for stroke prevention. Stop aspirin after starting apixaban 180 tablet 3   . clopidogrel 75 MG tablet Take 1 tablet (75 mg) by mouth daily. 30 tablet 0   . isosorbide mononitrate ER 60 MG 24 hr tablet Take 1 tablet (60 mg) by mouth every morning. 30 tablet 6   . lisinopril 5 MG tablet Take 1 tablet (5 mg) by mouth daily. For heart and blood pressure 100 tablet 1   . metoprolol succinate ER 25 MG 24 hr tablet Take 1 tablet (25 mg) by mouth daily. Do not chew or crush. 90 tablet 6     No current facility-administered medications for this visit.        Review of patient's allergies indicates:  Allergies   Allergen Reactions   . Hydrochlorothiazide Skin: Itching and Throat Swelling   . Lidocaine Other     Hallucinations   . Penicillins Skin: Hives     Reaction  in [place] (??)  Has patient had a PCN reaction causing immediate rash, facial/tongue/throat swelling, SOB or lightheadedness with hypotension: Yes  Has patient had a PCN reaction causing severe rash involving mucus membranes or skin necrosis: No  Has patient had a PCN reaction that required hospitalization: No  Has patient had a PCN reaction occurring within the last 10 years: No  If all of the above answers are "NO", then may proceed with Cephalosporin use.     . Coconut Fatty Acids      Other reaction(s): Other (See Comments)  Reaction??   . Latex Skin: Itching   . Losartan Headache and Other     Other reaction(s): Other (See Comments), Unknown  Pt doesn't remember.  High blood pressure   . Statins Headache and Other     Other reaction(s): Other (See Comments), Other (See Comments) high blood pressure, ear ache and foggy thinking        Geriatric ROS: per MS4 note 03/17/20  "ADLs: independent  IADLS: independent  Cognition: no acute concerns  Mood: not assessed  Mobility: no  acute concerns  Vision: not assessed  Hearing: not assessed  Continence: not assessed, no issues previously"  CFS-9: 3-4    Social History:  Social History     Tobacco Use   . Smoking status: Never Smoker   . Smokeless tobacco: Never Used   Substance Use Topics   . Alcohol use: Not Currently   . Drug use: Not Currently   DPOA/Legal Next of Kin/main contact person/main contact #s: daughter Serene    Immunization History   Administered Date(s) Administered   . COVID-19 Moderna mRNA 12 yrs and older 07/03/2019, 08/11/2019, 04/14/2020, 10/04/2020   . Influenza quadrivalent MDCK PF (Flucelvax) 04/18/2019   . Influenza quadrivalent PF 07/11/2017   . Influenza quadrivalent adjuvanted (Fluad) 03/17/2020   . Influenza trivalent high-dose 03/04/2018   . Pneumococcal conjugate PCV13 (Prevnar 13) 05/21/2014   . Pneumococcal polysaccharide PPSV23 (Pneumovax 23) 11/14/2012   . Tdap 11/14/2012           PHYSICAL EXAMINATION:  BP (!) 136/58   Pulse  67   Temp 36.2 C (Temporal)   Wt 98.4 kg (217 lb)   SpO2 98%   BMI 41.00 kg/m    Wt Readings from Last 5 Encounters:   12/23/20 98.4 kg (217 lb)   10/27/20 95.7 kg (211 lb)   10/06/20 94.8 kg (209 lb)   09/20/20 96.6 kg (213 lb)   09/02/20 95.9 kg (211 lb 6.7 oz)     General: Appears well, NAD  Eyes: No conjunctival injection, no scleral icterus  Ears/Nose/Mouth/Throat: oropharynx moist and without swelling  Respiratory: CTAB, no wheezes, no crackles  Cardiovascular: RRR, diastolic murmur. No leg swelling   Psychiatric: Affect normal, linear thinking        Assessment and plan     Other chest pain  Coronary artery disease involving native coronary artery of native heart without angina pectoris  Pt reported ongoing chest pain and SOB with fatigue when exerting   EKG showed changes from previous EKG with ST elevation on one lead, but pt does not have chest pain right now.   After consulting with her Cardiologist   - placed order for ASAP cath lab case request  - started pt with metoprolol  - advised to take nitroglycerine when she has chest pain as prescribed   - advised pt to have low treshold to call 911 for chest pain not responding to rest and nitro.   -     EKG 12-Lead; Future  -     nitroGLYCERIN 0.4 MG SL tablet; Place 1 tablet (0.4 mg) under the tongue every 5 minutes as needed for chest pain for up to 3 doses. If no relief, call 911.        -     Case Request Cath Lab        -     metoprolol succinate ER 50 MG 24 hr tablet; Take 1 tablet (50 mg) by mouth daily.    Cough  Pt reported that she has ongoing cough since her stroke.   This time the cough is with dry feeling in her throat.   Lung exam unremarkable   The dry feeling on her throat can be from allergy.   -     cetirizine 10 MG tablet; Take 1 tablet (10 mg) by mouth daily. For allergy symptoms.      I spent a total of 35 minutes for the patient's care on the date of the service.  [place] [facility]    [phone]

## 2020-12-23 NOTE — Patient Instructions (Signed)
It was a pleasure talking to you. Please follow up on the following items.     Dose of metoprolol is increased to 50 mg daily. Also take nitroglycerine as prescribed for chest pain.    2. Pick up medication from pharmacy    3. You will get a call from [facility] cardiac catheterization lab next week.         Thank you   Wendy [name], [facility]   [phone]

## 2020-12-26 LAB — EKG 12 LEAD
Atrial Rate: 55 {beats}/min
P Axis: 58 degrees
P-R Interval: 214 ms
Q-T Interval: 444 ms
QRS Duration: 84 ms
QTC Calculation: 424 ms
R Axis: 61 degrees
T Axis: 139 degrees
Ventricular Rate: 55 {beats}/min

## 2020-12-28 ENCOUNTER — Telehealth (HOSPITAL_BASED_OUTPATIENT_CLINIC_OR_DEPARTMENT_OTHER): Payer: Self-pay | Admitting: Cardiovascular Disease

## 2020-12-28 ENCOUNTER — Other Ambulatory Visit (HOSPITAL_BASED_OUTPATIENT_CLINIC_OR_DEPARTMENT_OTHER): Payer: Self-pay | Admitting: Internal Medicine

## 2020-12-28 DIAGNOSIS — I350 Nonrheumatic aortic (valve) stenosis: Secondary | ICD-10-CM

## 2020-12-28 DIAGNOSIS — R0609 Other forms of dyspnea: Secondary | ICD-10-CM

## 2020-12-28 DIAGNOSIS — R0600 Dyspnea, unspecified: Secondary | ICD-10-CM | POA: Insufficient documentation

## 2020-12-28 DIAGNOSIS — I351 Nonrheumatic aortic (valve) insufficiency: Secondary | ICD-10-CM

## 2020-12-28 DIAGNOSIS — I25118 Atherosclerotic heart disease of native coronary artery with other forms of angina pectoris: Secondary | ICD-10-CM

## 2020-12-28 NOTE — Telephone Encounter (Signed)
MRN: [id number]  NAME:Wendy [name]  DOB:[birth year]  OMBTD:[phone]  At your earliest convenience, please call      LVM for the patient to call me back for a consultation with Dr[name] orders are already on file for the angiogram

## 2020-12-29 ENCOUNTER — Ambulatory Visit: Payer: Medicare HMO | Attending: Clinical | Admitting: Clinical

## 2020-12-29 ENCOUNTER — Encounter (HOSPITAL_BASED_OUTPATIENT_CLINIC_OR_DEPARTMENT_OTHER): Payer: Self-pay | Admitting: Cardiovascular Disease

## 2020-12-29 DIAGNOSIS — N950 Postmenopausal bleeding: Secondary | ICD-10-CM | POA: Insufficient documentation

## 2020-12-29 DIAGNOSIS — F319 Bipolar disorder, unspecified: Secondary | ICD-10-CM | POA: Insufficient documentation

## 2020-12-29 DIAGNOSIS — I63511 Cerebral infarction due to unspecified occlusion or stenosis of right middle cerebral artery: Secondary | ICD-10-CM | POA: Insufficient documentation

## 2020-12-29 DIAGNOSIS — F419 Anxiety disorder, unspecified: Secondary | ICD-10-CM | POA: Insufficient documentation

## 2020-12-29 DIAGNOSIS — F32A Depression, unspecified: Secondary | ICD-10-CM

## 2020-12-29 DIAGNOSIS — F411 Generalized anxiety disorder: Secondary | ICD-10-CM | POA: Insufficient documentation

## 2020-12-29 NOTE — Progress Notes (Signed)
BHIP FOLLOW-UP NOTE      Type of Service: Psychotherapy  Duration of session: 45min   Distant Site Telemedicine Encounterpires 11/29/20):999}   I conducted this encounter via secure, live, face-to-face video conference with the patient. I reviewed the risks and benefits of telemedicine as pertinent to this visit and the patient agreed to proceed.     Provider Location: On-site location (clinic, hospital, on-site office) [place]  Patient Location: At home  Present with patient: No one else present   Patient's location during encounter: Home, see address in EPIC  Emergency contact name and telephone number reviewed in patient chart and is up to date: Emergency contact is listed in patient chart. Not reviewed with patient today.  In addition to the patient and the provider, the following others were present during the encounter: None  Prior to initial interview, the provider verified the patient's identity by asking for his or her name and date of birth. The provider informed the patient of their physical location and showed his or her badge. No recordings are kept from this encounter.   Emergency plan:  In the event of an emergency, the provider may ask the patient and/or family member/caregiver to contact 911. If it is not possible for the patient or someone at their location to contact 911, the provider will contact 911 and provide the patient's location. The patient has been informed of this safety plan and verbally consented to it.   This visit was conducted via telehealth instead of face to face because of the risk of COVID-19 exposure inherent in being physically present in the company of others.      CURRENT MENTAL HEALTH CONCERNS/REASON(S) FOR VISIT:   Patient is a 75 year old woman who prefers to be called Wendy [name]. She was referred by Dr. 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     Interval History:  Wendy [name] has been dealing with cardiac concerns recently. Her son bought her an A/C unit which has helped. She has been doing early morning chair yoga/tai chi/breathing/muscle relaxation exercises, has been able to incorporate that into part of her daily rhythm. Sometimes she does it in the evenings as well. She thinks it has helped with her breathing. She's going to the store once a week in the van that leaves from her building which she's enjoyed. Still doing her laughter class. She feels "too ADHD for reading." Facebook and twitter work better because it's short. She used to have a daily planner that had prompting questions in it (ie 'what is your goal for today?') that she really liked, she is thinking of buying it again. She's thinking about dating. Overall her mood is good and she is happy with the success she has had incorporating the habits of her choosing.     PHQ9: Not completed  GAD7: Not completed     Risk Assessment: No SI, HI or current safety concerns reported     Assessment of Progress or Regression and Expected Treatment Outcomes: Patient would benefit from continued treatment     DSM5 Diagnoses: Anxiety and depression unspecified (provisional diagoses). (r/o bipolar I with psychotic features vs history of psychosis due to a medical condition)     Intervention/Plan:  Over the next two weeks Wendy [name] wants to continue focusing on her habits.  Care Coordinator discussed Behavioral Health Integration Program (BHIP) and treatment alternatives.   No current psychiatric medication. If patient and her team decide to try psychiatric meds in the future she should be scheduled with [facility] psychiatrist Dr. Snowden, given some complicated experiences in the past with psychiatric meds.     Counseling interventions: Behavioral Activation  Substance use interventions (if applicable): None  Patient will follow-up with: Care Coordinator in 14 Days     The  patient is able to participate in treatment and consents to the BHIP treatment plan.   

## 2020-12-29 NOTE — Telephone Encounter (Signed)
Responded to the patients mychart message.  (Vahid) awaiting for a response

## 2020-12-29 NOTE — Telephone Encounter (Signed)
Responding to the patients ecare message

## 2020-12-29 NOTE — Telephone Encounter (Signed)
Okay for 1st available provider if Dr [name] has limited availability, thanks.

## 2020-12-29 NOTE — Telephone Encounter (Signed)
Please call son Tawnya Crook [phone] to schedule, thanks. See 12/29/20 MyChart message.

## 2020-12-30 NOTE — Telephone Encounter (Signed)
Possible to schedule with available provider? Thanks.

## 2021-01-02 ENCOUNTER — Encounter (HOSPITAL_BASED_OUTPATIENT_CLINIC_OR_DEPARTMENT_OTHER): Payer: Self-pay | Admitting: Internal Medicine

## 2021-01-02 NOTE — Telephone Encounter (Signed)
S/O: Increasing dyspnea on exertion, EKG changes, known coronary artery disease. Urgent cardiac catheterization recommended by cardiology Dr Bridgett Larsson 7/15. Not scheduled yet due to Dr Angelyn Punt limited availability, [facility] cardiology PC trying to reach son or daughter (son coordinates appointments).    A/P: [facility] RN: please call son Tawnya Crook -- should call [facility] cardiology PC [phone] to schedule with available provider.    MyChart message also sent.    FYI to cardiology team.    Thanks.
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
## 2021-01-12 ENCOUNTER — Ambulatory Visit: Payer: Medicare HMO | Attending: Clinical | Admitting: Clinical

## 2021-01-12 ENCOUNTER — Ambulatory Visit: Payer: Medicare HMO | Attending: Cardiovascular Disease | Admitting: Cardiovascular Disease

## 2021-01-12 DIAGNOSIS — N950 Postmenopausal bleeding: Secondary | ICD-10-CM | POA: Insufficient documentation

## 2021-01-12 DIAGNOSIS — F319 Bipolar disorder, unspecified: Secondary | ICD-10-CM | POA: Insufficient documentation

## 2021-01-12 DIAGNOSIS — Z951 Presence of aortocoronary bypass graft: Secondary | ICD-10-CM | POA: Insufficient documentation

## 2021-01-12 DIAGNOSIS — F419 Anxiety disorder, unspecified: Secondary | ICD-10-CM | POA: Insufficient documentation

## 2021-01-12 DIAGNOSIS — F32A Depression, unspecified: Secondary | ICD-10-CM

## 2021-01-12 DIAGNOSIS — I63511 Cerebral infarction due to unspecified occlusion or stenosis of right middle cerebral artery: Secondary | ICD-10-CM | POA: Insufficient documentation

## 2021-01-12 DIAGNOSIS — F411 Generalized anxiety disorder: Secondary | ICD-10-CM | POA: Insufficient documentation

## 2021-01-12 DIAGNOSIS — I25118 Atherosclerotic heart disease of native coronary artery with other forms of angina pectoris: Secondary | ICD-10-CM | POA: Insufficient documentation

## 2021-01-12 NOTE — Patient Instructions (Signed)
It was a pleasure seeing you today.    We have ordered a stress test to further evaluate your symptoms.    We will be in touch with you with these results and the next steps in your plan of care.
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
## 2021-01-12 NOTE — Progress Notes (Signed)
INTERVENTIONAL CARDIOLOGY CONSULT NOTE    Consult for: angina  Requested by: [name]. 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    History of Present Illness:  Wendy [name] returns to complex CAD clinic. Last seen in 2021. At that time referred for coronary catheterization demonstrating lesion in SVG-diagonal s/p PCI, occluded graft to ramus/diagonal, and disease at the bifurcation of RCA graft insertion, with decision to manage CTOs medically. In January 2022 she suffered CVA and was diagnosed with new atrial fibrillation and subsequently started on apixaban. She was recently evaluated by her primary cardiologist in May 2022.  At that visit patient reported having some exertional angina.  Her metoprolol was increased to 25 mg daily and her Imdur was increased to 60 mg daily. Clopidogrel was stopped on 11/28/2020.  She has a history of statin intolerance and was referred to the lipid clinic. BP 145/63, above reported goal of SBP <130. At her 12/23/2020 visit with internal medicine, she reported "sharp pain under her left breast, extreme SOB with chest pain associated, increasing recovery time from normal physical exertion".  She was subsequently referred to Dr. [name] for further evaluation and treatment.    Today, she reports dyspnea on exertion when walking 100-200 ft that requires her to stop and catch her breath for 5-10 min. This occurs daily when leaving her apartment to use the elevator to go downstairs for her mail. She otherwise is sedentary and mostly stays at home. 2-3 weeks ago she had left-sided chest pain that accompanied her shortness of breath and was relieved with NTG. She saw. Dr. Bridgett Larsson in clinic and her anti-anginal therapy was escalated. Following this she has had no more episodes of  chest pain and no further use of NTG, but her dyspnea has continued. She has no known history of lung disease. She states that her symptoms are worse than she experienced 6 months ago. Her son, who participates in the interview today, shares his concern that she is minimizing her symptoms. She has been unable to leave her home in 2 weeks to visit a park or do other activities out of concern for her symptoms.    Cardiac Studies:     Coronary angiography 11/18/2019  Left main: moderate caliber vessel which tapers rapidly by 50% to give rise to trifurcation of LAD, LCX, and Ramus.  LAD: small vessel occluded in the proximal segment, with competitive flow seen from the LIMA.  LCX: small vessel with luminal irregularities.   Ramus: moderate-large vessel supplying the lateral wall, occluded in the proximal segment. There is perhaps some antegrade filling which then sluggishly fills skip portion of SVG-RI-diag graft.   RCA: small vessel occluded in the proximal segment with right to right collaterals.    LIMA-LAD: small graft with minimal luminal irregularities, widely patent.  SVG-diagonal: severely diseased in the proximal-mid segment.   SVG-Ramus-diagonal: occluded just after take off from the aorta.  SVG-rPDA: widely patent but with severe disease at the insertion and proximal to RCA bifurcation.    Percutaneous coronary intervention 11/18/19:  1. Successful IVUS guided PCI of SVG-diagonal with 3.0(30) mm Resolute Onyx DES.  2. Diagnostic angiography demonstrating severe disease in SVG-diagonal and SVG-rPDA, occluded SVG-RI-diagonal, and patent LIMA-LAD.  3. Successful ultrasound guided 54F RFA access.  4. Perclose to the RFA.  Dobutamine stress TTE 11/03/2019:  1. Patient developed chest pain at peak stress, described to be similar to  the pain she felt before having her CABG, that resolved during recovery.   2. Baseline ECG shows normal sinus rhythm with nonspecific ST/T-wave changes.  At peak stress, there are inferior  (III, aVF) ST elevations with reciprocal  ST depressions in leads I and aVL.   3. Baseline TTE shows normal LV size and systolic function (biplane EF 65%)  with no regional wall motion abnormalities. There is mild mitral annular  calcification, mild-moderate aortic stenosis, and mild-moderate aortic regurgitation.  4. At peak stress, there is hypokinesis to akinesis in the myocardial  segments as diagrammed.   5. In summary, there is ECG and echocardiographic evidence of inducible ischemia.    Echocardiogram 06/23/20:  1. The left ventricle is normal in size. There is mild concentric increase in the wall thickness of the left ventricle. Global left ventricular function is normal (biplane EF 65%). The left ventricular average longitudinal peak systolic strain is borderline (-17%). No regional wall motion abnormalities are present.  2. The right ventricle is normal size. The right ventricular systolic function is normal.  3. The aortic valve is mildly calcified. There is mild to moderate valvular aortic stenosis (Vmax 2.9 m/s, MG 20 mmHg, AVA 1.4 cm2). Mild to moderate aortic regurgitation is present.  4. Mild mitral annular calcification is present. The mitral annular calcification is posterior.  5. Agitated saline contrast study was negative at rest, with Valsalva, and with cough.  6. Pulmonary artery systolic pressure could not be estimated due to an insufficient TR jet.  7. There is no pericardial effusion.  Compared with the baseline TTE from 11/03/2019, no significant changes are noted.    Medical History:   Patient Active Problem List    Diagnosis Date Noted    Dyspnea on exertion [R06.00] 12/28/2020     Added automatically from request for surgery [id number]      Other chest pain [R07.89] 12/23/2020     Added automatically from request for surgery [id number]      Chronic anticoagulation [Z79.01] 08/18/2020    DISH (diffuse idiopathic skeletal hyperostosis) [M48.10] 07/20/2020    Postmenopausal bleeding [N95.0]  07/15/2020     07/15/20 pelvic U/S endometrium thickened with cystic lesions thickness 18 mm, multiple fibroids      Paroxysmal atrial fibrillation ([place]) [I48.0] 07/11/2020    History of R MCA cerebrovascular accident (CVA) with residual deficit [I69.30] 06/22/2020     06/22/20 R MCA cardioembolic CVA likely due to paroxysmal atrial fibrillation      Esophageal dysphagia [R13.19] 08/24/2019    Gastroesophageal reflux disease without esophagitis [K21.9] 08/24/2019    Body mass index 40.0-44.9, adult ([place]) [Z68.41] 07/16/2019    Primary osteoarthritis of right knee [M17.11] 07/16/2019    Hx of CABG [Z95.1]      CABG (2006.  LIMA-LAD, SVG-dRCA (with endarterectomy of RPDA and PL), SeqSVG-RI-D1, SVG-D3)   Cath 2021 with lesion in SVG-Diagonal s/p PCI, occluded graft to ramus/diagonal, disease at the bifurcation of RCA graft insertion managed medically at that time as sx improving.       Essential hypertension [I10] 06/08/2019    Asthma [J45.909] 06/08/2019    Bipolar disorder without psychotic features ([place]) [F31.9] 06/08/2019    Coronary artery disease [I25.10] 06/08/2019     Multiple vessel 2006  Abnormal DSE 10/2019  Cath 11/18/19 PCI SVG-diagonal  Clopidogrel through 11/2020      Colon polyps [K63.5] 06/08/2019  Depression [F32.A] 06/08/2019    Generalized anxiety disorder [F41.1] 06/08/2019    Migraine [G43.909] 06/08/2019    Multinodular goiter [E04.2] 06/08/2019     07/15/20 thyroid U/S multiple thyroid nodules, R lobe one nodule TIRADS 4 --> per endocrine repeat U/S 1 year (07/2021)      Vitamin D deficiency [E55.9] 06/08/2019    Mixed hyperlipidemia [E78.2]      Cannot tolerate statins      Hyperparathyroidism ([place]) [E21.3]     Osteoporosis without current pathological fracture [M81.0]     Nephrolithiasis [N20.0] 04/27/2019    Non-functioning R kidney [N28.9] 01/22/2019     Due to ureteral stone      Pulmonary nodule less than 6 cm determined by computed tomography of lung [R91.1] 01/09/2019      R lung 5 mm nodule incidental finding [facility] [place])      Abnormal computed tomography of pelvis [R93.5] 01/09/2019     Probable fibroid      Diastolic dysfunction [W97.98] 08/17/2017     Formatting of this note might be different from the original.   Normal left ventricular systolic function, ejection fraction 65 to 92%   Diastolic dysfunction - grade II (elevated filling pressures)   Dilated left atrium - mild   Degenerative mitral valve disease   Mitral annular calcification   Aortic stenosis - mild   Aortic regurgitation - moderate   Normal right ventricular systolic function    ECHO 2017      History of R parathyroidectomy (2017) [E89.2] 02/07/2016    Aortic stenosis, mild [I35.0] 01/08/2016     02/25/18 TTE Mild aortic stenosis with mild-moderate aorticregurgitation  06/25/20 TTE mild-moderate AS      Aortic regurgitation [I35.1] 01/08/2016     02/25/18 TTE mild-moderate      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    History of R metatarsal fractures [Z87.81] 2017     2017 R 2nd, 3rd, 4th proximal metatarsals       Allergies:  Review of patient's allergies indicates:  Allergies   Allergen Reactions    Hydrochlorothiazide Skin: Itching and Throat Swelling    Lidocaine Other     Hallucinations    Penicillins Skin: Hives     Reaction in [place] (??)  Has patient had a PCN reaction causing immediate rash, facial/tongue/throat swelling, SOB or lightheadedness with hypotension: Yes  Has patient had a PCN reaction causing severe rash involving mucus membranes or skin necrosis: No  Has patient had a PCN reaction that required hospitalization: No  Has patient had a PCN reaction occurring within the last 10 years: No  If all of the above answers are "NO", then may proceed with Cephalosporin use.      Coconut Fatty Acids      Other reaction(s): Other (See Comments)  Reaction??    Latex Skin: Itching    Losartan Headache and Other     Other reaction(s): Other (See Comments),  Unknown  Pt doesn't remember.  High blood pressure    Statins Headache and Other     Other reaction(s): Other (See Comments), Other (See Comments) high blood pressure, ear ache and foggy thinking      Medications:  Current Outpatient Medications   Medication Instructions    acetaminophen (TYLENOL) 500 mg, Every 6 hours PRN    amLODIPine 10 MG tablet take 1 tablet by mouth once daily    apixaban (ELIQUIS) 5 mg, Oral, 2 times daily, Blood thinner for stroke prevention.  Stop aspirin after starting apixaban    cetirizine (ZYRTEC) 10 mg, Oral, Daily, For allergy symptoms.    clopidogrel (PLAVIX) 75 mg, Oral, Daily    isosorbide mononitrate ER (IMDUR) 60 mg, Oral, Every morning    lisinopril (PRINIVIL; ZESTRIL) 5 mg, Oral, Daily, For heart and blood pressure    metoprolol succinate ER (TOPROL XL) 50 mg, Oral, Daily    nitroGLYCERIN (NITROSTAT) 0.4 mg, Sublingual, Every 5 min PRN, If no relief, call 911.      Social History:  Lives in [place].  Social EtOH, no tobacco, denies illicit drugs    Family History:  Non-contributory.    Review of Systems:   All systems were reviewed and negative except as noted above.     Exam:  General: awake, alert, no acute distress  Skin: no visible rashes or lesions  Head: eyes white, moist mucous membranes   Neuro: awake, alert, appropriate, no focal deficits  Psych: answers questions appropriately     Laboratory Results:  Lab Results   Component Value Date    WBC 5.06 07/15/2020    HEMOGLOBIN 14.6 07/15/2020    HEMATOCRIT 43 07/15/2020    PLATELET 190 07/15/2020    SODIUM 142 08/10/2020    POTASSIUM 4.1 08/10/2020    BUN 13 08/10/2020    CREATININE 1.02 08/10/2020    GLUCOSE 108 09/02/2020    INR 1.1 09/02/2020    PROTIME 13.5 09/02/2020    PTT 33 07/15/2020    AST 16 07/15/2020    ALT 12 07/15/2020    ALK 66 07/15/2020    ALBUMIN 4.0 07/15/2020     Assessment:  Dyspnea on exertion  Coronary artery disease   S/p CABG 2006 (LIMA-lAD, SVG-RCA, SVG-RI-D1 [occluded], SVG-D3),  Mild-to-moderate aortic stenosis  Paroxysmal atrial fibrillation  Chronic anticoagulation  Hypertension  Hyperlipidemia  Prior stroke 06/2020  Non-function right kidney  Bipolar disorder    Ms. Danh returns to complex CAD clinic with progressive dyspnea on exertion that is most likely related to progression of severe coronary artery disease. She has experienced some relief with medical therapy, which is reassuring, but her symptoms of dyspnea with minimal exertion are life-limiting and persist.     I personally reviewed her previous angiography today which demonstrates a 50% LM stenosis, proximal occlusion of LAD,  proximally occluded ramus, small LCx with minimal disease, and proximally occluded RCA with a patent LIMA-LAD graft, patent SVG-D graft stented in the mid vessel, and patent SVG-PDA with severe disease at the anastomosis and in the distal vessel. The SVG-ramus-diag graft is occluded. Thus, if we performed coronary angiography now I anticipate that we would identify or re-identify multiple severe lesions with significant lesion complexity and procedural risk. To minimize potential risk and identify a lesion(s) that are most likely to provide benefit to the patient with intervention, we will proceed with a pharmocologic SPECT MPI to identify an ischemic territory.     Upon review of this study we are prepared to offer coronary angiography with staged PCI and did briefly discuss risks and benefits of these procedures today in clinic including bleeding, infection, vascular or nerve injury, renal injury, anaphylaxis, intubation, arrhythmia, stroke, myocardial infarction, need for emergent surgery, and death.     In the interim, ongoing escalation of medical therapy is recommended. Return precautions for chest pain refractory to NTG or increasing frequency of NTG use were reviewed.     Plan:  1. Refer for pharmacologic SPECT MPI.  2. Pending stress test result,  plan for diagnostic coronary  angiography.    Staffed with Dr. [name].    Wendy Freestone, MD, MSc  Interventional Cardiology Fellow  [phone]

## 2021-01-14 ENCOUNTER — Encounter (HOSPITAL_BASED_OUTPATIENT_CLINIC_OR_DEPARTMENT_OTHER): Payer: Self-pay | Admitting: Internal Medicine

## 2021-01-16 NOTE — Telephone Encounter (Signed)
Seen by Dr [name] 01/12/21 (telemedicine). Greatly appreciate care coordination.

## 2021-01-17 NOTE — Telephone Encounter (Signed)
Recommend contact home health agency directly to obtain copies of prior [facility] OT assessments.

## 2021-01-18 ENCOUNTER — Other Ambulatory Visit: Payer: Self-pay

## 2021-01-23 ENCOUNTER — Encounter (HOSPITAL_BASED_OUTPATIENT_CLINIC_OR_DEPARTMENT_OTHER): Payer: Self-pay | Admitting: Internal Medicine

## 2021-01-23 DIAGNOSIS — I25118 Atherosclerotic heart disease of native coronary artery with other forms of angina pectoris: Secondary | ICD-10-CM

## 2021-01-23 DIAGNOSIS — R0600 Dyspnea, unspecified: Secondary | ICD-10-CM

## 2021-01-23 DIAGNOSIS — R0609 Other forms of dyspnea: Secondary | ICD-10-CM

## 2021-01-23 DIAGNOSIS — I6930 Unspecified sequelae of cerebral infarction: Secondary | ICD-10-CM

## 2021-01-23 NOTE — Telephone Encounter (Signed)
Continuation from 8/6 MyChart encounter.    S/O: Questions about ability to drive. Note CVA earlier this year with deficits and ASCVD with angina (pending further testing). Transportation barriers.    A/P: Driving safety concerns considering overall health.  1) Home health OT referral ordered for IADL assessment  2) SW referral for transportation options  3) Will need in-person appointment for driving safety evaluation, currently scheduled 9/8    MyChart message sent to Wendy [name] and family.

## 2021-01-23 NOTE — Telephone Encounter (Signed)
Started new MyChart encounter 01/23/21 as this encounter is closed.

## 2021-01-26 ENCOUNTER — Encounter (HOSPITAL_BASED_OUTPATIENT_CLINIC_OR_DEPARTMENT_OTHER): Payer: Self-pay

## 2021-01-26 ENCOUNTER — Telehealth (HOSPITAL_BASED_OUTPATIENT_CLINIC_OR_DEPARTMENT_OTHER): Payer: Self-pay | Admitting: Internal Medicine

## 2021-01-26 ENCOUNTER — Ambulatory Visit: Payer: Medicare HMO | Attending: Clinical | Admitting: Clinical

## 2021-01-26 ENCOUNTER — Other Ambulatory Visit (HOSPITAL_BASED_OUTPATIENT_CLINIC_OR_DEPARTMENT_OTHER): Payer: Self-pay | Admitting: Internal Medicine

## 2021-01-26 DIAGNOSIS — F411 Generalized anxiety disorder: Secondary | ICD-10-CM | POA: Insufficient documentation

## 2021-01-26 DIAGNOSIS — M810 Age-related osteoporosis without current pathological fracture: Secondary | ICD-10-CM

## 2021-01-26 DIAGNOSIS — F319 Bipolar disorder, unspecified: Secondary | ICD-10-CM | POA: Insufficient documentation

## 2021-01-26 DIAGNOSIS — E782 Mixed hyperlipidemia: Secondary | ICD-10-CM

## 2021-01-26 DIAGNOSIS — F4323 Adjustment disorder with mixed anxiety and depressed mood: Secondary | ICD-10-CM | POA: Insufficient documentation

## 2021-01-26 DIAGNOSIS — I6930 Unspecified sequelae of cerebral infarction: Secondary | ICD-10-CM

## 2021-01-26 DIAGNOSIS — N950 Postmenopausal bleeding: Secondary | ICD-10-CM | POA: Insufficient documentation

## 2021-01-26 DIAGNOSIS — F32A Depression, unspecified: Secondary | ICD-10-CM | POA: Insufficient documentation

## 2021-01-26 DIAGNOSIS — I63511 Cerebral infarction due to unspecified occlusion or stenosis of right middle cerebral artery: Secondary | ICD-10-CM | POA: Insufficient documentation

## 2021-01-26 DIAGNOSIS — I25118 Atherosclerotic heart disease of native coronary artery with other forms of angina pectoris: Secondary | ICD-10-CM

## 2021-01-26 DIAGNOSIS — N200 Calculus of kidney: Secondary | ICD-10-CM

## 2021-01-26 NOTE — Telephone Encounter (Signed)
Responded to pts request via mychart message      Loretha Brasil, MSW  [address] [place]  [employer]  [phone]

## 2021-01-26 NOTE — Progress Notes (Signed)
BHIP FOLLOW-UP NOTE   Type of Service:Psychotherapy  Duration of session:51mn  Distant Site Telemedicine Encounterpires 11/29/20):999}  I conducted this encounter via secure, live, face-to-face video conference with the patient. I reviewed the risks and benefits of telemedicine as pertinent to this visit and the patient agreed to proceed.    Provider [place] (clinic, hospital, on-site office)[place]  Patient Location:At home  Present with patient:None   Patient's location during encounter:Home, [facility] contact name and telephone number reviewed in patient chart and is up to date:Emergency contactislisted in patient chart. Not reviewed with patient today.  In addition to the patient and the provider, the following others were present during the encounter:None  Prior toinitialinterview, the provider verified the patient's identity by asking for his or her name and date of birth. The provider informed the patient of their physical location and showed his or her badge. No recordings are kept from this encounter.  Emergency plan:  In the event of an emergency, the provider may ask the patient and/or family member/caregiver to contact 911. If it is not possible for the patient or someone at their location to contact 911, the provider will contact 911 and provide the patient's location. The patienthas beeninformed of this safety plan and verbally consented to it.   This visit was conducted via telehealth instead of face to face because of the risk of COVID-19 exposure inherent in being physically present in the company of others.    CURRENT MENTAL HEALTH CONCERNS/REASON(S) FOR VISIT:  Patient is a 75year old woman who prefers to be called [name] She wasreferred by Dr. 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    Interval History:Wendy [name] is still participating in activities in her senior building and using her new planner which she's very happy with. Driving has been a point of contention with her kids recently. After the stroke she gave her car to her daughter who now is not giving it back, saying that PAlfredianeeds to see a doctor first to see if it's ok to drive. She says [facility] is [facility]. PLaydenfinds [facility] [employer]stoo clunky to use. There is an electric car in her building, she plans to ask if she has access to it. She says she could take an UMelburn Popperto important appointments. [place] the [place] for grocery shopping. Focus today is how the power dynamic with her kids has changed in the context of aging, health problems they have had to step in for, but also the fact that she's older and more experienced than them and wants to maintain her independence. Her daughter is pregnant, due in early September. Wendy [name] her daughter's anxiety is understandable and also a factor.     PHQ9:4  GAD7:Not completed    Risk Assessment:No SI, HI or current safety concerns reported    Assessment of Progress or Regression and Expected Treatment Outcomes:Patient would benefit from continued treatment. Plan is to meet through May 2023.    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 She does not think she is clinically depressed and I would tend to agree. Can spend more time talking about this at our next visit. 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 I have not seen any evidence to date that would contradict this.  Care Coordinator discussed  [facility]) and treatment alternatives.  No currentpsychiatric medication. If patient and her team decide to try psychiatric meds in the future she should be scheduled with [facility] psychiatrist Dr. Georgette Dover, given her complicated experiences in the past with psychiatric meds.  Counseling interventions:Supportive psychotherapy  Substance use interventions (if applicable):None  Patient will follow-up with:Care Coordinatorin 14 Days    The patient is able to participate in treatment and consents to the [name] treatment plan.

## 2021-01-26 NOTE — Telephone Encounter (Signed)
RETURN CALL: Voicemail - Detailed Message      SUBJECT:  General Message     MESSAGE:   [facility] states that they received the referral from Dr. Ronalee Red and wanted to let provider know that the start of care will be 08.24.22 and frequency is 1-3 times per week for 2 weeks

## 2021-01-30 NOTE — Telephone Encounter (Signed)
Noted and start of care 02/01/21 is okay. Thanks.

## 2021-02-01 ENCOUNTER — Telehealth (HOSPITAL_BASED_OUTPATIENT_CLINIC_OR_DEPARTMENT_OTHER): Payer: Self-pay | Admitting: Internal Medicine

## 2021-02-01 NOTE — Telephone Encounter (Signed)
RETURN CALL: No Call Back Needed      SUBJECT:  General Message     MESSAGE: Estill Bamberg from [place] states that request for both OT and PT has been received.     The Home Health agency is attempting to reach patient and is waiting on patient to respond to confirm and schedule an appointment with her.     Due to this they are anticipating an appointment to be scheduled on 02/07/21.

## 2021-02-06 ENCOUNTER — Telehealth (HOSPITAL_BASED_OUTPATIENT_CLINIC_OR_DEPARTMENT_OTHER): Payer: Medicare HMO | Admitting: Internal Medicine

## 2021-02-09 ENCOUNTER — Ambulatory Visit: Payer: Medicare HMO | Attending: Clinical | Admitting: Clinical

## 2021-02-09 DIAGNOSIS — F32A Depression, unspecified: Secondary | ICD-10-CM | POA: Insufficient documentation

## 2021-02-09 DIAGNOSIS — F411 Generalized anxiety disorder: Secondary | ICD-10-CM | POA: Insufficient documentation

## 2021-02-09 DIAGNOSIS — N950 Postmenopausal bleeding: Secondary | ICD-10-CM | POA: Insufficient documentation

## 2021-02-09 DIAGNOSIS — I63511 Cerebral infarction due to unspecified occlusion or stenosis of right middle cerebral artery: Secondary | ICD-10-CM | POA: Insufficient documentation

## 2021-02-09 DIAGNOSIS — F319 Bipolar disorder, unspecified: Secondary | ICD-10-CM | POA: Insufficient documentation

## 2021-02-09 DIAGNOSIS — F4323 Adjustment disorder with mixed anxiety and depressed mood: Secondary | ICD-10-CM | POA: Insufficient documentation

## 2021-02-09 NOTE — Progress Notes (Signed)
BHIP FOLLOW-UP NOTE   Type of Service:Psychotherapy  Duration of session:20mn  Distant Site Telemedicine Encounterpires 11/29/20):999}  I conducted this encounter via secure, live, face-to-face video conference with the patient. I reviewed the risks and benefits of telemedicine as pertinent to this visit and the patient agreed to proceed.    [place] office[place]  Patient Location:At home  Present with patient:None   Patient's location during encounter:[facility] contact name and telephone number reviewed in patient chart and is up to date:Emergency contactislisted in patient chart. Not reviewed with patient today.  In addition to the patient and the provider, the following others were present during the encounter:None  Prior toinitialinterview, the provider verified the patient's identity by asking for his or her name and date of birth. The provider informed the patient of their physical location and showed his or her badge. No recordings are kept from this encounter.  Emergency plan:  In the event of an emergency, the provider may ask the patient and/or family member/caregiver to contact 911. If it is not possible for the patient or someone at their location to contact 911, the provider will contact 911 and provide the patient's location. The patienthas beeninformed of this safety plan and verbally consented to it.   This visit was conducted via telehealth instead of face to face because of the risk of COVID-19 exposure inherent in being physically present in the company of others.    CURRENT MENTAL HEALTH CONCERNS/REASON(S) FOR VISIT:  Patient is a 75year old woman who prefers to be called Wendy [name] She wasreferred by Dr. 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    Interval History:Wendy [name] is doing well, some ups and downs that feel within normal limits, overall feeling grounded. Her first grandchild was born Saturday, a boy named FMultimedia programmer    We discuss Shenelle's diagnosis. When she was referred to me she thought she was depressed but now she's not sure that was right. She has been thinking a lot about how her health, mood, nutrition, water intake, sleep, balance of activities and social interactions affect her mood. We discuss a diagnosis of adjustment disorder. "I think I do have anxiety issues." These involve questions about if others will like her, believe her, how they are judging her, about her own behavior, if she is acting in line with her values. She does not think anxiety negatively interferes with her life in significant ways. She thinks about the serenity prayer, focuses on what is in her control, problem solving. She does not think the stroke is affecting her anymore. She thinks some of the things she struggles with, like organizing things, were problems before the stroke.     PHQ9:Not completed  GAD7:Not completed  Sent via MyChart    Risk Assessment:No SI, HI or current safety concerns reported    Assessment of Progress or Regression and Expected Treatment Outcomes:Patient would benefit from continued treatment. Plan is to meet through May 2023.    DSM5Diagnoses:Adjustment disorder     Intervention/Plan:Next appt in two weeks  Care Coordinator discussed [place]([facility] and treatment alternatives.  No currentpsychiatric medication. If patient and her team decide to try psychiatric meds in the future she should be scheduled with [facility] psychiatrist Dr. SGeorgette Dover givenhercomplicated experiences in the past with  psychiatric meds.  Counseling interventions:Supportive psychotherapy  Substance use interventions (if applicable):None  Patient will follow-up with:Care Coordinatorin 14  Days    The patient is able to participate in treatment and consents to the [facility] treatment plan.

## 2021-02-15 NOTE — Progress Notes (Deleted)
[facility] CLINIC SUBSEQUENT CARE VISIT NOTE       No chief complaint on file.      ID/CC:  Wendy [name] is a 75 year old community-dwelling female being seen for ***. Last visit with me 07/07/20 (telemedicine), with ARNP [place] 12/23/20.     HPI:  Baseline: lives alone at ***apartment in [place] today by {FAMILYMEMBERS:103024::"spouse"}  Transportation: ***  Interpreter or not: NO    Interim history:  01/12/21 cardiology (interventional) follow up  BHIP  ***     Concerns today: ***    Patient Active Problem List   Diagnosis    Nephrolithiasis    Hyperparathyroidism ([place])    Osteoporosis without current pathological fracture    Non-functioning R kidney    Essential hypertension    Aortic stenosis, mild    Asthma    Bipolar disorder without psychotic features ([place])    Coronary artery disease    Colon polyps    Depression    Generalized anxiety disorder    Mixed hyperlipidemia    Migraine    History of R parathyroid adenoma    History of R parathyroidectomy (2017)    Multinodular goiter    Vitamin D deficiency    Hx of CABG    Aortic regurgitation    Pulmonary nodule less than 6 cm determined by computed tomography of lung    Abnormal computed tomography of pelvis    Body mass index 40.0-44.9, adult (HCC)    History of R metatarsal fractures    Primary osteoarthritis of right knee    Esophageal dysphagia    Gastroesophageal reflux disease without esophagitis    Diastolic dysfunction    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 (Patient not taking: Reported on 01/11/2021)      amLODIPine 10 MG tablet take 1  tablet by mouth once daily 90 tablet 1    apixaban (Eliquis) 5 MG tablet Take 1 tablet (5 mg) by mouth 2 times a day. Blood thinner for stroke prevention. Stop aspirin after starting apixaban 180 tablet 3    cetirizine 10 MG tablet Take 1 tablet (10 mg) by mouth daily. For allergy symptoms. (Patient not taking: Reported on 01/11/2021) 30 tablet 0    clopidogrel 75 MG tablet Take 1 tablet (75 mg) by mouth daily. 30 tablet 0    isosorbide mononitrate ER 60 MG 24 hr tablet Take 1 tablet (60 mg) by mouth every morning. 30 tablet 6    lisinopril 5 MG tablet Take 1 tablet (5 mg) by mouth daily. For heart and blood pressure 100 tablet 1    metoprolol succinate ER 50 MG 24 hr tablet Take 1 tablet (50 mg) by mouth daily. (Patient taking differently: Take 25 mg by mouth daily.) 90 tablet 3    nitroGLYCERIN 0.4 MG SL tablet Place 1 tablet (0.4 mg) under the tongue every 5 minutes as needed for chest pain for up to 3 doses. If no relief, call 911. 25 tablet 1     No current facility-administered medications for this visit.  Review of patient's allergies indicates:  Allergies   Allergen Reactions    Hydrochlorothiazide Skin: Itching and Throat Swelling    Lidocaine Other     Hallucinations    Penicillins Skin: Hives     Reaction in [place] (??)  Has patient had a PCN reaction causing immediate rash, facial/tongue/throat swelling, SOB or lightheadedness with hypotension: Yes  Has patient had a PCN reaction causing severe rash involving mucus membranes or skin necrosis: No  Has patient had a PCN reaction that required hospitalization: No  Has patient had a PCN reaction occurring within the last 10 years: No  If all of the above answers are "NO", then may proceed with Cephalosporin use.      Coconut Fatty Acids      Other reaction(s): Other (See Comments)  Reaction??    Latex Skin: Itching    Losartan Headache and Other     Other reaction(s): Other (See Comments), Unknown  Pt doesn't remember.  High blood  pressure    Statins Headache and Other     Other reaction(s): Other (See Comments), Other (See Comments) high blood pressure, ear ache and foggy thinking        Geriatric ROS: (last 03/17/20 MS4***)  "ADLs: independent  IADLS: independent  Cognition: no acute concerns  Mood: not assessed  Mobility: no acute concerns  Vision: not assessed  Hearing: not assessed  Continence: not assessed, no issues previously"  ADLs: ***  IADLs: ***  Cognition: ***  Mood: ***  Mobility: ***  Falls in past year: {Y/N:101112}***  Vision: ***  Hearing: ***  Continence: {Y/N:101112}***  Goals of Care: ***  CFS-9: {NUMBERS 0-10:102198} {https://[email or web address].org.uk/sites/default/files/content/attachment/[email or web address]/rockwood_cfs.pdf}     Social History:  Social History     Tobacco Use    Smoking status: Never Smoker    Smokeless tobacco: Never Used   Substance Use Topics    Alcohol use: Not Currently    Drug use: Not Currently     Lives: ***  Marital Status: ***  Children: ***  Substance Use: ***  Nutrition: ***  Education: ***  Health Literacy: ***  Occupation: ***  DPOA/Legal Next of Kin/main contact person/main contact #s: *** daughter Hulan Saas, son Vahid    Immunization History   Administered Date(s) Administered    COVID-19 Moderna mRNA 12 yrs and older 07/03/2019, 08/11/2019, 04/14/2020, 10/04/2020    Influenza quadrivalent MDCK PF (Flucelvax) 04/18/2019    Influenza quadrivalent PF 07/11/2017    Influenza quadrivalent adjuvanted (Fluad) 03/17/2020    Influenza trivalent high-dose 03/04/2018    Pneumococcal conjugate PCV13 (Prevnar 13) 05/21/2014    Pneumococcal polysaccharide PPSV23 (Pneumovax 23) 11/14/2012    Tdap 11/14/2012              PHYSICAL EXAMINATION:  There were no vitals taken for this visit. There is no height or weight on file to calculate BMI.  ***  Wt Readings from Last 3 Encounters:   12/23/20 98.4 kg (217 lb)   10/27/20 95.7 kg (211 lb)   10/06/20 94.8 kg (209 lb)       GENERAL: *** older female. Appearance:  ***-groomed, {CONSTITUTIONAL APPEARANCE:103682::"stated age, normal affect and in no acute distress"}.  HEENT: EYES: {EYES - LIDS AND CONJUNCTIVA:103794::"Conjunctivae and lids are normal."} Pupils: {EYES - PUPILS:103683::"Pupils equal, round and reactive to light."} Mouth and oropharynx: ***.  CV: ***regular rate and rhythm, normal S1, S2, no ***murmurs/rubs/S3S4  LUNGS: ***unlabored breathing, clear to auscultation ***and percussion bilaterally  ABDOMEN: {GASTROENTESTINAL -  ABDOMINAL EXAM:103706::"normal in  appearance with normal bowel sounds and no masses or tenderness."}. ***No hepatosplenomegaly.  Extremities: ***  Neurologic: ***  Gait: {MUSCULOSKELETAL - GAIT AND STATION:103720::"Normal."}     Labs/Other Results:  Office Visit on 12/23/2020   Component Date Value    Ventricular Rate 12/23/2020 55     Atrial Rate 12/23/2020 55     P-R Interval 12/23/2020 214     QRS Duration 12/23/2020 84     Q-T Interval 12/23/2020 444     QTC Calculation 12/23/2020 424     P Axis 12/23/2020 58     R Axis 12/23/2020 61     T Axis 12/23/2020 139     Diagnosis 12/23/2020                      Value:Sinus bradycardia with 1st degree AV block  Possible Left atrial enlargement  ST elevation, consider inferior injury or acute infarct  ** ** ACUTE MI / STEMI ** **  Consider right ventricular involvement in acute inferior infarct  Abnormal ECG  When compared with ECG of 15-Jul-2020 12:55,  T wave inversion now evident in Lateral leads  Confirmed by Burt Knack, MD, [place] ([id number]) on 12/26/2020 2:41:33 PM              ASSESSMENT/PLAN:  ***    Relevant Health Care Maintenance:  - Labs: diabetes screening glucose 108 09/02/20; lipids high 06/23/20; TSH ***normal 08/10/20; 25-OH vitamin D ***normal 07/16/19; vitamin B-12 ***normal 03/04/18 ([facility])  - Supplements: ***  - Immunizations: ***  - Osteoporosis screening (DEXA): indicated N/A known osteoporosis. Last DEXA 07/16/19 next due ~07/2021.  - Abdominal aortic aneurysm screening:  indicated NO (USPSTF guideline: female, never smoked)  - Colon cancer screening: indicated YES ***  - Mammogram/breast cancer screening: indicated YES ***    Advance care planning/POLST: ***    No follow-ups on file., sooner if needed for other questions or concerns.              02/16/2021 8:45 AM (Arrive by 8:30 AM) Debroah Loop, MD [facility]    02/16/2021 11:00 AM (Arrive by 10:45 AM) [facility]    02/23/2021 9:30 AM (Arrive by 9:15 AM) Arman Bogus, MSW [facility]    03/09/2021 1:00 PM (Arrive by 12:45 PM) Arman Bogus, MSW [facility]    04/03/2021 10:00 AM (Arrive by 9:45 AM) Shirline Frees, MD [facility]

## 2021-02-16 ENCOUNTER — Encounter (HOSPITAL_BASED_OUTPATIENT_CLINIC_OR_DEPARTMENT_OTHER): Payer: Self-pay | Admitting: Internal Medicine

## 2021-02-16 ENCOUNTER — Ambulatory Visit: Payer: Medicare HMO | Attending: Internal Medicine | Admitting: Registered"

## 2021-02-16 ENCOUNTER — Encounter (HOSPITAL_BASED_OUTPATIENT_CLINIC_OR_DEPARTMENT_OTHER): Payer: Medicare HMO | Admitting: Internal Medicine

## 2021-02-16 ENCOUNTER — Ambulatory Visit (HOSPITAL_BASED_OUTPATIENT_CLINIC_OR_DEPARTMENT_OTHER): Payer: Medicare HMO

## 2021-02-16 ENCOUNTER — Telehealth (HOSPITAL_BASED_OUTPATIENT_CLINIC_OR_DEPARTMENT_OTHER): Payer: Self-pay | Admitting: Internal Medicine

## 2021-02-16 DIAGNOSIS — N200 Calculus of kidney: Secondary | ICD-10-CM | POA: Insufficient documentation

## 2021-02-16 DIAGNOSIS — I6930 Unspecified sequelae of cerebral infarction: Secondary | ICD-10-CM

## 2021-02-16 DIAGNOSIS — M810 Age-related osteoporosis without current pathological fracture: Secondary | ICD-10-CM | POA: Insufficient documentation

## 2021-02-16 DIAGNOSIS — E782 Mixed hyperlipidemia: Secondary | ICD-10-CM | POA: Insufficient documentation

## 2021-02-16 DIAGNOSIS — F4323 Adjustment disorder with mixed anxiety and depressed mood: Secondary | ICD-10-CM | POA: Insufficient documentation

## 2021-02-16 DIAGNOSIS — Z713 Dietary counseling and surveillance: Secondary | ICD-10-CM | POA: Insufficient documentation

## 2021-02-16 DIAGNOSIS — I25118 Atherosclerotic heart disease of native coronary artery with other forms of angina pectoris: Secondary | ICD-10-CM | POA: Insufficient documentation

## 2021-02-16 NOTE — Telephone Encounter (Signed)
S/O: Missed appointment (in-person) this morning. Recent multiple concerns including chest pain/dyspnea on exertion, ability to drive. Home health recent visit, I haven't received any faxed information yet.    Tried calling Ms. Hengst to ask about any new concerns, call went straight to voice mail.    A/P: [facility] RN: please check with Ms. Chiusano if any new concerns?    [facility]: please assist with re-scheduling with tag-on RN/MA visit for [facility] or [facility]. Note my appointment availability is limited.    Thanks.

## 2021-02-16 NOTE — Telephone Encounter (Signed)
RETURN CALL: Voicemail - Detailed Message      SUBJECT:  Appointment Request     REASON FOR VISIT: follow up  PREFERRED DATE/TIME:asap  ADDITIONAL INFORMATION:patient would like for someone to call to reschedule her appointment she missed on 02/16/21

## 2021-02-16 NOTE — Progress Notes (Signed)
Unable to reach [place] at either phone number in her chart.  Left vm at each and let her know someone will reach out to reschedule    Ed Blalock, MS, RD, CD, [facility]  Department mobile: [phone]. [phone]  Email: [email or web address]

## 2021-02-16 NOTE — Telephone Encounter (Signed)
Attempted to call pt to reschedule her f/u but reached VM. Left msg requesting call back. Direct CB# provided. Will also reply to pt's MyChart message.

## 2021-02-17 NOTE — Telephone Encounter (Signed)
Pt called back. We rescheduled her f/u with PCP to 10/27 at 8:45am. Pt would like a sooner appt for throat pain - reports "pain on inside of either side of pharynx." Scheduled pt to see ARNP on  9/12 at 2:30pm.

## 2021-02-20 ENCOUNTER — Encounter (HOSPITAL_BASED_OUTPATIENT_CLINIC_OR_DEPARTMENT_OTHER): Payer: Medicare HMO | Admitting: Adult Health

## 2021-02-20 ENCOUNTER — Encounter (HOSPITAL_BASED_OUTPATIENT_CLINIC_OR_DEPARTMENT_OTHER): Payer: Self-pay | Admitting: Clinical

## 2021-02-20 ENCOUNTER — Encounter (HOSPITAL_BASED_OUTPATIENT_CLINIC_OR_DEPARTMENT_OTHER): Payer: Self-pay

## 2021-02-23 ENCOUNTER — Ambulatory Visit (HOSPITAL_BASED_OUTPATIENT_CLINIC_OR_DEPARTMENT_OTHER): Payer: Medicare HMO | Admitting: Clinical

## 2021-03-02 ENCOUNTER — Telehealth (HOSPITAL_BASED_OUTPATIENT_CLINIC_OR_DEPARTMENT_OTHER): Payer: Self-pay | Admitting: Adult Health

## 2021-03-02 NOTE — Telephone Encounter (Signed)
Called pt and informed her no other TEs made today so the call she received was likely just a reminder for her appt tomorrow. Pt verbalized understanding.

## 2021-03-02 NOTE — Progress Notes (Signed)
Left without being seen.

## 2021-03-02 NOTE — Telephone Encounter (Signed)
RETURN CALL: Voicemail - Detailed Message      SUBJECT:  General Message     MESSAGE: Patient stated she was returning a call to the clinic that they had placed regarding her appointment on 03/03/21. She stated she believed had more than an appointment reminder. CCR attempted to warm transfer to clinic. Clinic was assisting other patients.

## 2021-03-03 ENCOUNTER — Telehealth (HOSPITAL_BASED_OUTPATIENT_CLINIC_OR_DEPARTMENT_OTHER): Payer: Self-pay | Admitting: Adult Health

## 2021-03-03 ENCOUNTER — Ambulatory Visit (HOSPITAL_COMMUNITY): Payer: Self-pay

## 2021-03-03 ENCOUNTER — Other Ambulatory Visit (HOSPITAL_BASED_OUTPATIENT_CLINIC_OR_DEPARTMENT_OTHER): Payer: Self-pay | Admitting: Nursing

## 2021-03-03 ENCOUNTER — Ambulatory Visit: Payer: Medicare HMO | Attending: Adult Health | Admitting: Adult Health

## 2021-03-03 ENCOUNTER — Ambulatory Visit (HOSPITAL_BASED_OUTPATIENT_CLINIC_OR_DEPARTMENT_OTHER): Payer: Medicare HMO | Admitting: Nursing

## 2021-03-03 VITALS — BP 112/56 | HR 59 | Temp 97.0°F | Wt 218.4 lb

## 2021-03-03 DIAGNOSIS — Z748 Other problems related to care provider dependency: Secondary | ICD-10-CM | POA: Insufficient documentation

## 2021-03-03 DIAGNOSIS — Z20822 Contact with and (suspected) exposure to covid-19: Secondary | ICD-10-CM | POA: Insufficient documentation

## 2021-03-03 DIAGNOSIS — J029 Acute pharyngitis, unspecified: Secondary | ICD-10-CM | POA: Insufficient documentation

## 2021-03-03 DIAGNOSIS — H60503 Unspecified acute noninfective otitis externa, bilateral: Secondary | ICD-10-CM

## 2021-03-03 DIAGNOSIS — R059 Cough, unspecified: Secondary | ICD-10-CM | POA: Insufficient documentation

## 2021-03-03 LAB — COVID-19 CORONAVIRUS QUALITATIVE PCR: COVID-19 Coronavirus Qual PCR Result: NOT DETECTED

## 2021-03-03 MED ORDER — CETIRIZINE HCL 10 MG OR TABS
10.0000 mg | ORAL_TABLET | Freq: Every day | ORAL | 0 refills | Status: DC
Start: 2021-03-03 — End: 2021-04-03

## 2021-03-03 MED ORDER — CIPROFLOXACIN-DEXAMETHASONE 0.3-0.1 % OT SUSP
4.0000 [drp] | Freq: Two times a day (BID) | OTIC | 1 refills | Status: AC
Start: 2021-03-03 — End: 2021-03-10

## 2021-03-03 MED ORDER — FLUTICASONE PROPIONATE 50 MCG/ACT NA SUSP
2.0000 | Freq: Two times a day (BID) | NASAL | 1 refills | Status: DC
Start: 2021-03-03 — End: 2021-04-06

## 2021-03-03 NOTE — Patient Instructions (Addendum)
It was a pleasure talking to you. Please follow up on the following items.     Pick up medication from pharmacy.     2. Follow up with Dr.Huang as scheduled.       Thank you   Morgann Woodburn, [facility]   [phone]

## 2021-03-03 NOTE — Telephone Encounter (Signed)
RETURN CALL: Voicemail - Detailed Message      SUBJECT:  Medication Questions     NAME OF MEDICATION(S): Ciptofloxacin-Dexamethasone 0.3-0.1% otic Suspension   CONCERNS/QUESTIONS: Per her pharmacy they can not fill this medication   ADDITIONAL INFORMATION: Can this be sent to another pharmacy? Can you please assist in finding a pharmacy that has this? Please contact patient's son. Okay to leave a detailed VM if no answer. Thank you.

## 2021-03-03 NOTE — Progress Notes (Signed)
Wendy [name] wishes to be tested because six people in her building have [place]. Pt is asymptomatic.    NP swab obtained w/o incident, taken to lab.

## 2021-03-03 NOTE — Progress Notes (Signed)
[facility] CARE CLINIC SUBSEQUENT CARE VISIT NOTE         ID/CC:  Wendy [name] is a 75 year old community-dwelling female being seen for earache, and sore throat.      HPI:  Baseline: lives alone at [facility] (senior housing)  Accompanied today by son  Interpreter or not: NO     Ear infection   Was seen at [facility] in clinic about two weeks ago for ear pain and was given ear drops  The ear drops helped but after med run out pain comes back   Pain is not all the time. Tends to be more at night. Pain is mainly on right side, but left ear also feels tender sometimes.   Has been applying heat on the jaw that was helpful.   Denies fever, chills, nausea, and vomiting     Sore throat   For a couple of weeks. Has been gargling with veniger   Has chronic cough and post nasal drip.   There was covid in the building she lives two weeks ago  Six people who live in the building were diagnosed      Current Outpatient Medications   Medication Sig Dispense Refill    acetaminophen 500 MG tablet Take 500 mg by mouth every 6 hours as needed. (Patient not taking: Reported on 01/11/2021)      amLODIPine 10 MG tablet take 1 tablet by mouth once daily 90 tablet 1    apixaban (Eliquis) 5 MG tablet Take 1 tablet (5 mg) by mouth 2 times a day. Blood thinner for stroke prevention. Stop aspirin after starting apixaban 180 tablet 3    cetirizine 10 MG tablet Take 1 tablet (10 mg) by mouth daily. For allergy symptoms. (Patient not taking: Reported on 01/11/2021) 30 tablet 0    clopidogrel 75 MG tablet Take 1 tablet (75 mg) by mouth daily. 30 tablet 0    isosorbide mononitrate ER 60 MG 24 hr tablet Take 1 tablet (60 mg) by mouth every morning. 30 tablet 6    lisinopril 5 MG tablet Take 1 tablet (5 mg) by mouth daily. For heart and blood pressure 100 tablet 1    metoprolol succinate ER 50 MG 24 hr tablet Take 1 tablet (50 mg) by mouth daily. (Patient taking differently: Take 25 mg by mouth daily.) 90 tablet 3    nitroGLYCERIN 0.4 MG SL  tablet Place 1 tablet (0.4 mg) under the tongue every 5 minutes as needed for chest pain for up to 3 doses. If no relief, call 911. 25 tablet 1     No current facility-administered medications for this visit.        Review of patient's allergies indicates:  Allergies   Allergen Reactions    Hydrochlorothiazide Skin: Itching and Throat Swelling    Lidocaine Other     Hallucinations    Penicillins Skin: Hives     Reaction in [place] (??)  Has patient had a PCN reaction causing immediate rash, facial/tongue/throat swelling, SOB or lightheadedness with hypotension: Yes  Has patient had a PCN reaction causing severe rash involving mucus membranes or skin necrosis: No  Has patient had a PCN reaction that required hospitalization: No  Has patient had a PCN reaction occurring within the last 10 years: No  If all of the above answers are "NO", then may proceed with Cephalosporin use.      Coconut Fatty Acids      Other reaction(s): Other (See Comments)  Reaction??    Latex Skin: Itching    Losartan Headache and Other     Other reaction(s): Other (See Comments), Unknown  Pt doesn't remember.  High blood pressure    Statins Headache and Other     Other reaction(s): Other (See Comments), Other (See Comments) high blood pressure, ear ache and foggy thinking        Geriatric ROS: per MS4 note 03/17/20  "ADLs: independent  IADLS: independent  Cognition: no acute concerns  Mood: not assessed  Mobility: no acute concerns  Vision: not assessed  Hearing: not assessed  Continence: not assessed, no issues previously"  CFS-9: 3-4    Social History:  Social History     Tobacco Use    Smoking status: Never Smoker    Smokeless tobacco: Never Used   Substance Use Topics    Alcohol use: Not Currently    Drug use: Not Currently   DPOA/Legal Next of Kin/main contact person/main contact #s: daughter Serene    Immunization History   Administered Date(s) Administered    COVID-19 Moderna mRNA 12 yrs and older 07/03/2019,  08/11/2019, 04/14/2020, 10/04/2020    Influenza quadrivalent MDCK PF (Flucelvax) 04/18/2019    Influenza quadrivalent PF 07/11/2017    Influenza quadrivalent adjuvanted (Fluad) 03/17/2020    Influenza trivalent high-dose 03/04/2018    Pneumococcal conjugate PCV13 (Prevnar 13) 05/21/2014    Pneumococcal polysaccharide PPSV23 (Pneumovax 23) 11/14/2012    Tdap 11/14/2012       PHYSICAL EXAM:  BP (!) 112/56    Pulse (!) 59    Temp 36.1 C (Temporal)    Wt 99.1 kg (218 lb 6.4 oz)    SpO2 97%    BMI 41.27 kg/m     Wt Readings from Last 5 Encounters:   03/03/21 99.1 kg (218 lb 6.4 oz)   12/23/20 98.4 kg (217 lb)   10/27/20 95.7 kg (211 lb)   10/06/20 94.8 kg (209 lb)   09/20/20 96.6 kg (213 lb)       BP Readings from Last 5 Encounters:   03/03/21 (!) 112/56   12/23/20 (!) 136/58   10/27/20 (!) 145/63   09/20/20 138/61   09/02/20 (!) 123/56       GEN: Well appearing, sitting in chair, NAD   HEENT: no conjunctival icterus, pallor or injection, erythema of ear canal on both ears. TM appear intact and without bulging. Oropharynx without swelling, redness, and exudates   CV: RRR, Normal S1 & S2, with no R/M/G. No  edema.  RESP: Easy work of breathing. CTAB, without crackles, wheezes, or rhonchi   MSK: Normal muscle bulk and tone   SKIN: No rashes, no nail changes   Neuro: Alert and fully oriented, CN II-XII grossly intact  PSYCH:   Appropriate and euthymic         Assessment and plan     75 y.o. female with CAD, diastolic dysfunction, afib, GERD, esophageal dysphagia, asthma, dyspnea on exertion, CVA with residual effect, and depression seen for ear pain and sore throat.     Acute otitis externa of both ears, unspecified type  Pt reported ear pain for over two weeks. Was seen at [facility] and given ear drop.  Not clear what type of ear drop she was given. Reported mainly pain is on right ear with some pain on left too.   On exam the ear canals on both sides have erythema but no swelling and discharge.   There was some  tenderness on right  side with pulling the ear. Treated for ear canal infection/inflammation.   Advised pt to use med on both sides with possibility of left ear progressing/worsening.   -     ciprofloxacin-dexamethasone 0.3-0.1 % otic suspension; Place 4 drops into each ear 2 times a day for 7 days.    Sore throat  Cough  Pt also reported sore throat with ongoing cough   Oropharynx clear of swelling, exudate and erythema. No lymph node swelling   Lungs are clear. Likely cough is might be triggered due to allergy. Allergy med refilled.   Tested negative for covid.   -     cetirizine 10 MG tablet; Take 1 tablet (10 mg) by mouth daily. For allergy symptoms.  -     fluticasone propionate 50 MCG/ACT nasal spray; Spray 2 sprays into each nostril 2 times a day    Assistance needed with transportation  Filled and signed [employer] paper work. Pt needs metro ride b/c of limited ability to use public transportation by herself due to CVA with residual deficit on cognition. Pt reported concern struggling with her cognition after CVA. Pt's son stated that pt is receiving assistance from family with planning and making things happen that require planning series of events and activities.     I spent a total of 31 minutes for the patient's care on the date of the service.        Sour John, [facility]   [phone]

## 2021-03-09 ENCOUNTER — Ambulatory Visit (HOSPITAL_BASED_OUTPATIENT_CLINIC_OR_DEPARTMENT_OTHER): Payer: Medicare HMO | Admitting: Clinical

## 2021-03-09 ENCOUNTER — Ambulatory Visit: Payer: Medicare HMO | Attending: Clinical | Admitting: Clinical

## 2021-03-09 DIAGNOSIS — F32A Depression, unspecified: Secondary | ICD-10-CM | POA: Insufficient documentation

## 2021-03-09 DIAGNOSIS — F4323 Adjustment disorder with mixed anxiety and depressed mood: Secondary | ICD-10-CM | POA: Insufficient documentation

## 2021-03-09 DIAGNOSIS — F411 Generalized anxiety disorder: Secondary | ICD-10-CM | POA: Insufficient documentation

## 2021-03-09 DIAGNOSIS — F319 Bipolar disorder, unspecified: Secondary | ICD-10-CM | POA: Insufficient documentation

## 2021-03-09 DIAGNOSIS — N950 Postmenopausal bleeding: Secondary | ICD-10-CM | POA: Insufficient documentation

## 2021-03-09 DIAGNOSIS — I63511 Cerebral infarction due to unspecified occlusion or stenosis of right middle cerebral artery: Secondary | ICD-10-CM | POA: Insufficient documentation

## 2021-03-10 NOTE — Telephone Encounter (Signed)
Per outside dispense history, appears this Rx was transferred/filled at a [employer].
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
## 2021-03-18 ENCOUNTER — Ambulatory Visit (HOSPITAL_BASED_OUTPATIENT_CLINIC_OR_DEPARTMENT_OTHER): Payer: Medicare HMO

## 2021-03-23 ENCOUNTER — Ambulatory Visit: Payer: Medicare HMO | Attending: Clinical | Admitting: Clinical

## 2021-03-23 ENCOUNTER — Other Ambulatory Visit: Payer: Self-pay

## 2021-03-23 DIAGNOSIS — F4323 Adjustment disorder with mixed anxiety and depressed mood: Secondary | ICD-10-CM | POA: Insufficient documentation

## 2021-03-23 DIAGNOSIS — F411 Generalized anxiety disorder: Secondary | ICD-10-CM | POA: Insufficient documentation

## 2021-03-23 DIAGNOSIS — I63511 Cerebral infarction due to unspecified occlusion or stenosis of right middle cerebral artery: Secondary | ICD-10-CM | POA: Insufficient documentation

## 2021-03-23 DIAGNOSIS — F32A Depression, unspecified: Secondary | ICD-10-CM | POA: Insufficient documentation

## 2021-03-23 DIAGNOSIS — N950 Postmenopausal bleeding: Secondary | ICD-10-CM | POA: Insufficient documentation

## 2021-03-23 DIAGNOSIS — F319 Bipolar disorder, unspecified: Secondary | ICD-10-CM | POA: Insufficient documentation
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
## 2021-03-26 ENCOUNTER — Other Ambulatory Visit (HOSPITAL_BASED_OUTPATIENT_CLINIC_OR_DEPARTMENT_OTHER): Payer: Self-pay | Admitting: Internal Medicine

## 2021-03-26 DIAGNOSIS — I10 Essential (primary) hypertension: Secondary | ICD-10-CM

## 2021-03-27 MED ORDER — AMLODIPINE BESYLATE 10 MG OR TABS
10.0000 mg | ORAL_TABLET | Freq: Every day | ORAL | 0 refills | Status: DC
Start: 2021-03-27 — End: 2021-04-03

## 2021-03-27 NOTE — Telephone Encounter (Signed)
This request requires your approval:       Amlodipine -last blood pressure reading on 03/03/21 was 112/56,continue the same?  defer to provider to review.

## 2021-03-28 ENCOUNTER — Encounter (HOSPITAL_BASED_OUTPATIENT_CLINIC_OR_DEPARTMENT_OTHER): Payer: Self-pay

## 2021-04-01 ENCOUNTER — Other Ambulatory Visit (HOSPITAL_BASED_OUTPATIENT_CLINIC_OR_DEPARTMENT_OTHER): Payer: Self-pay | Admitting: Internal Medicine

## 2021-04-01 ENCOUNTER — Other Ambulatory Visit (HOSPITAL_BASED_OUTPATIENT_CLINIC_OR_DEPARTMENT_OTHER): Payer: Self-pay | Admitting: Adult Health

## 2021-04-01 DIAGNOSIS — I10 Essential (primary) hypertension: Secondary | ICD-10-CM

## 2021-04-01 DIAGNOSIS — R059 Cough, unspecified: Secondary | ICD-10-CM

## 2021-04-03 ENCOUNTER — Ambulatory Visit: Payer: Medicare HMO | Attending: Neurology | Admitting: Neurology

## 2021-04-03 VITALS — Ht 60.0 in | Wt 214.0 lb

## 2021-04-03 DIAGNOSIS — I639 Cerebral infarction, unspecified: Secondary | ICD-10-CM | POA: Insufficient documentation

## 2021-04-03 DIAGNOSIS — G4733 Obstructive sleep apnea (adult) (pediatric): Secondary | ICD-10-CM | POA: Insufficient documentation

## 2021-04-03 MED ORDER — CETIRIZINE HCL 10 MG OR TABS
ORAL_TABLET | ORAL | 0 refills | Status: DC
Start: 2021-04-03 — End: 2021-11-23

## 2021-04-03 MED ORDER — AMLODIPINE BESYLATE 10 MG OR TABS
ORAL_TABLET | ORAL | 0 refills | Status: DC
Start: 2021-04-03 — End: 2021-07-03
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
## 2021-04-04 ENCOUNTER — Ambulatory Visit (HOSPITAL_BASED_OUTPATIENT_CLINIC_OR_DEPARTMENT_OTHER): Payer: Medicare HMO | Admitting: Clinical

## 2021-04-05 NOTE — Progress Notes (Addendum)
[name] SUBSEQUENT CARE VISIT NOTE       Chief Complaint   Patient presents with    Follow-Up        ID/CC:  Wendy [name] is a 75 year old community-dwelling female being seen for follow up of sinus symptoms, dyspnea on exertion and other concerns. Last visit with me 07/07/20, saw ARNP [place] 03/23/21.     HPI:  Baseline: lives alone at senior housing apartment  Accompanied today by son  Transportation: via son  Interpreter or not: NO     Concerns today: #1 Continued sinus congestion and constant post nasal drip. +cough. Neck/throat/jaw aches. Would like me to check ears. Also has ongoing reflux symptoms so not sure how much is contributing. Using cetirizine daily + fluticasone nasal spray once daily per ARNP [place]ne at last visit. Used to sniff menthol drops, found it is habit-forming. Has Neti pot, hasn't started using it yet.    #2 Shortness of breath with walking: can walk about 250 steps then needs to rest for about 5 to 10 minutes. Then can start walking again. Followed by cardiology here, saw interventional cardiology 01/12/21 (telemedicine) with recommendation for cardiac stress test. She would prefer not to do the test at this time. Prior cardiac rehab @ [facility] when able to drive.    Sometimes feels dizzy.    #3 Previously asked if okay to drive. Known coronary artery disease, prior R MCA CVA earlier this year.    #4 Recent sleep medicine appointment (telemedicine). No known snoring, but she lives alone. Sleep medicine planning HSAT.    #5 Pain at base of skull for some weeks. Check scalp due to pain since CVA in January. Family gave her gift certificate to massage therapist, she hasn't followed through yet.    #6 R eye gland check. Sometimes gets swollen.    #7 Transportation and driving: family available, applied for [employer] (form completed by [employer] at last visit). Her building has a bus for grocery store, etc.    Patient Active Problem List    Diagnosis    Nephrolithiasis    Hyperparathyroidism ([place])    Osteoporosis without current pathological fracture    Non-functioning R kidney    Essential hypertension    Aortic stenosis, mild    Asthma    Bipolar disorder without psychotic features ([place])    Coronary artery disease    Colon polyps    Depression    Generalized anxiety disorder    Mixed hyperlipidemia    Migraine    History of R parathyroid adenoma    History of R parathyroidectomy (2017)    Multinodular goiter    Vitamin D deficiency    Hx of CABG    Aortic regurgitation    Pulmonary nodule less than 6 cm determined by computed tomography of lung    Abnormal computed tomography of pelvis    Body mass index 40.0-44.9, adult (HCC)    History of R metatarsal fractures    Primary osteoarthritis of right knee    Esophageal dysphagia    Gastroesophageal reflux disease without esophagitis    Diastolic dysfunction    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      amLODIPine 10 MG tablet TAKE ONE TABLET BY MOUTH ONE TIME DAILY 90 tablet 0    apixaban (Eliquis) 5 MG tablet Take 1 tablet (5 mg) by mouth 2 times a day. Blood thinner for stroke prevention. Stop aspirin after starting apixaban 180 tablet 3    cetirizine 10 MG tablet Take 1 tablet by mouth daily For allergy symptoms 90 tablet 0    clopidogrel 75 MG tablet Take 1 tablet (75 mg) by mouth daily. (Patient not taking: Reported on 04/03/2021) 30 tablet 0    fluticasone propionate 50 MCG/ACT nasal spray Spray 2 sprays into each nostril 2 times a day. 15 mL 1    isosorbide mononitrate ER 60 MG 24 hr tablet  Take 1 tablet (60 mg) by mouth every morning. 30 tablet 6    lisinopril 5 MG tablet Take 1 tablet (5 mg) by mouth daily. For heart and blood pressure 100 tablet 1    metoprolol succinate ER 50 MG 24 hr tablet Take 1 tablet (50 mg) by mouth daily. (Patient taking differently: Take 25 mg by mouth daily.) 90 tablet 3    nitroGLYCERIN 0.4 MG SL tablet Place 1 tablet (0.4 mg) under the tongue every 5 minutes as needed for chest pain for up to 3 doses. If no relief, call 911. 25 tablet 1     No current facility-administered medications for this visit.        Review of patient's allergies indicates:  Allergies   Allergen Reactions    Hydrochlorothiazide Skin: Itching and Throat Swelling    Lidocaine Other     Hallucinations    Penicillins Skin: Hives     Reaction in [place] (??)  Has patient had a PCN reaction causing immediate rash, facial/tongue/throat swelling, SOB or lightheadedness with hypotension: Yes  Has patient had a PCN reaction causing severe rash involving mucus membranes or skin necrosis: No  Has patient had a PCN reaction that required hospitalization: No  Has patient had a PCN reaction occurring within the last 10 years: No  If all of the above answers are "NO", then may proceed with Cephalosporin use.      Coconut Fatty Acids      Other reaction(s): Other (See Comments)  Reaction??    Latex Skin: Itching    Losartan Headache and Other     Other reaction(s): Other (See Comments), Unknown  Pt doesn't remember.  High blood pressure    Statins Headache and Other     Other reaction(s): Other (See Comments), Other (See Comments) high blood pressure, ear ache and foggy thinking        Geriatric ROS:  ADLs: independent  IADLs: transportation as above, food preparation doesn't like to cook, primarily uses microwave. Okay with shopping, managing medications (medi-set) and finances.  Mood: prior diagnosis  Vision: eyeglasses  Hearing: some concerns  Continence: Yes  CFS-9: 4     Social  History:  DPOA/Legal Next of Kin/main contact person/main contact #s: daughter Wendy [name], son Wendy [name]    Immunization History   Administered Date(s) Administered    COVID-19 Moderna mRNA 12 yrs and older 07/03/2019, 08/11/2019, 04/14/2020, 10/04/2020    COVID-19 Murray mRNA bivalent booster 12 yrs and older 03/14/2021    Influenza quadrivalent MDCK PF (Flucelvax) 04/18/2019    Influenza quadrivalent PF 07/11/2017  Influenza quadrivalent adjuvanted (Fluad) 03/17/2020, 03/14/2021    Influenza trivalent high-dose 03/04/2018    Pneumococcal conjugate PCV13 (Prevnar 13) 05/21/2014    Pneumococcal polysaccharide PPSV23 (Pneumovax 23) 11/14/2012    Tdap 11/14/2012              PHYSICAL EXAMINATION:  BP (!) 152/63    Pulse 80    Temp 36.2 C (Temporal)    Wt 98.9 kg (218 lb)    SpO2 97%    BMI 42.58 kg/m    Wt Readings from Last 5 Encounters:   04/06/21 98.9 kg (218 lb)   04/03/21 97.1 kg (214 lb)   03/03/21 99.1 kg (218 lb 6.4 oz)   12/23/20 98.4 kg (217 lb)   10/27/20 95.7 kg (211 lb)     Both eyes 20/25 R eye 20/30 L eye 20/50    GENERAL: pleasant older female. Appearance: well-groomed, stated age, sitting comfortably in chair.  HEENT: Head: scalp no lesions. Ears - normal bilateral TM's and external ear canals Nose: bilateral nasal turbinate swelling. Pharynx: clear.  Neck: no cervical lymphadenopathy   CV: regular rate and rhythm, normal S1, S2, 3/6 systolic murmur, no [id number]  LUNGS: unlabored breathing, clear to auscultation bilaterally  Neurologic: word-finding difficulty  Gait: slowed    Maze test: abnormal -- errors in dead ends  MoCA administered by RN: 27/30, reported missed items on recall           ASSESSMENT/PLAN:  1. Dyspnea on exertion  - Needs cardiology follow up, declined repeat stress test  - Recommend retire from driving  - CBC; Future  - Iron Binding Capacity (Includes Total Iron); Future  - Ferritin; Future    2. Allergic rhinitis due to pollen, unspecified seasonality  - Stop fluticasone  nasal spray  - Continue cetirizine  - ipratropium 0.03 % nasal spray; Spray 2 sprays into each nostril 2 times a day. For nasal congestion. Replaces fluticasone  Dispense: 30 mL; Refill: 1  - Consider Neti pot -- has available, doesn't use; alternate option nasal saline rinses    3. Hyperparathyroidism ([place])  - Basic Metabolic Panel; Future    4. Coronary artery disease involving native coronary artery of native heart with other form of angina pectoris [facility])  - Needs cardiology follow up as above  - Continue beta-blocker, isosorbide mononitrate, ACEI; doesn't tolerate statins  - Basic Metabolic Panel; Future  - CBC; Future  - Iron Binding Capacity (Includes Total Iron); Future  - Ferritin; Future    5. Memory difficulties: MoCA 27/30 today, abnormal Maze test. Likely multi-factorial.  - Prior normal TSH 08/2020  - Vitamin B12 (Cobalamin); Future    6. Scalp pain  - Unclear etiology, noticed when had CVA 06/2020. Will monitor    7. Non-functioning R kidney  8. Nephrolithiasis  - Basic Metabolic Panel; Future lab monitoring    9. Meibomian gland disease of right eye  - Recommend warm moist compresses when symptomatic    10. 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 History of R parathyroid adenoma  - Basic Metabolic Panel; Future    12. Need for hepatitis B screening test  - Hepatitis B Surface Antibody; Future to determine if needs hepatitis B vaccination    Relevant Health Care Maintenance:  - Labs: diabetes screening HbA1c 5.7% 06/23/20; lipids 06/23/20; TSH normal 08/10/20; 25-OH vitamin D normal 07/16/19; vitamin B-12 re-check today, prior  normal 03/04/18 ([facility])  - Supplements: none  - Immunizations: defer additional vaccines at this time  - Osteoporosis screening (DEXA): indicated N/A known osteoporosis. Last DEXA 05/2020 next due ~05/2022.  - Abdominal aortic aneurysm screening: indicated NO per USPSTF guideline (female,  never smoked)  - Colon cancer screening: indicated YES, defer discussion to next visit  - Mammogram/breast cancer screening: indicated YES, defer discussion to next visit -- needs to arrange transportation    Advance care planning/POLST: DPOA-HC daughter Wendy [name], son Tawnya Crook -- available in EHR    Return in about 3 months (around 07/07/2021).     I spent a total of 64 minutes for the patient's care on the date of the service.       ADDENDUM  Orders Only on 04/06/21   1. Ferritin   Result Value Ref Range    Ferritin 50 10 - 180 ng/mL   2. Basic Metabolic Panel   Result Value Ref Range    Sodium 140 135 - 145 meq/L    Potassium 3.8 3.6 - 5.2 meq/L    Chloride 103 98 - 108 meq/L    Carbon Dioxide, Total 29 22 - 32 meq/L    Anion Gap 8 4 - 12    Glucose 115 62 - 125 mg/dL    Urea Nitrogen 16 8 - 21 mg/dL    Creatinine 1.09 (H) 0.38 - 1.02 mg/dL    Calcium 9.4 8.9 - 10.2 mg/dL    eGFR by CKD-EPI 2021 53 (L) >59 mL/min/[1.73_m2]   3. Vitamin B12 (Cobalamin)   Result Value Ref Range    Vitamin B12 (Cobalamin) 220 180 - 914 pg/mL   4. Hepatitis B Surface Antibody   Result Value Ref Range    Hepatitis B Surface Ab Nonreactive     Hepatitis B Surf Antibody Intl Units <8.00 m[IU]/mL    Hepatitis B Surface Antibody Interp       Antibody to the Hepatitis B surface antigen (anti-HBs) was not detected. Anti-HBs develops  after Hepatitis B infection, persist for years, and is the protective antibody to the Hepatitis B virus.   Persons with recent Hepatitis B infection may only have antibody to the Hepatitis B core antigen (anti-HBc) as the sole serologic marker of infection.     5. CBC   Result Value Ref Range    WBC 6.21 4.3 - 10.0 10*3/uL    RBC 4.93 3.80 - 5.00 10*6/uL    Hemoglobin 15.0 11.5 - 15.5 g/dL    Hematocrit 46 (H) 36.0 - 45.0 %    MCV 93 81 - 98 fL    MCH 30.4 27.3 - 33.6 pg    MCHC 32.8 32.2 - 36.5 g/dL    Platelet Count 226 150 - 400 10*3/uL    RDW-CV 13.2 11.0 - 14.5 %   6. Iron Binding Capacity (Includes Total Iron)    Result Value Ref Range    Transferrin 259 192 - 382 mg/dL    Iron, SRM 86 31 - 171 ug/dL    Total Iron Binding Capacity 363 270 - 535 ug/dL    Transferrin Saturation 24 10 - 45 %      CBC mildly elevated hematocrit, previously noted back to 06/2017 per [facility] Everywhere records   BMP stable CKD stage 3a   Vitamin B-12 low normal, recommend supplementation   Vitamin D within normal limits    Fe panel within normal limits    Hepatitis B non-immune recommend future vaccination  MyChart result comments and result letter sent.

## 2021-04-06 ENCOUNTER — Other Ambulatory Visit (HOSPITAL_BASED_OUTPATIENT_CLINIC_OR_DEPARTMENT_OTHER): Payer: Self-pay | Admitting: Internal Medicine

## 2021-04-06 ENCOUNTER — Ambulatory Visit (HOSPITAL_COMMUNITY): Payer: Self-pay

## 2021-04-06 ENCOUNTER — Ambulatory Visit: Payer: Medicare HMO | Attending: Internal Medicine | Admitting: Internal Medicine

## 2021-04-06 ENCOUNTER — Encounter (HOSPITAL_BASED_OUTPATIENT_CLINIC_OR_DEPARTMENT_OTHER): Payer: Self-pay | Admitting: Internal Medicine

## 2021-04-06 ENCOUNTER — Ambulatory Visit (HOSPITAL_BASED_OUTPATIENT_CLINIC_OR_DEPARTMENT_OTHER): Payer: Medicare HMO | Admitting: Nursing

## 2021-04-06 VITALS — BP 152/63 | HR 80 | Temp 97.2°F | Wt 218.0 lb

## 2021-04-06 DIAGNOSIS — R413 Other amnesia: Secondary | ICD-10-CM | POA: Insufficient documentation

## 2021-04-06 DIAGNOSIS — Z1159 Encounter for screening for other viral diseases: Secondary | ICD-10-CM

## 2021-04-06 DIAGNOSIS — I25118 Atherosclerotic heart disease of native coronary artery with other forms of angina pectoris: Secondary | ICD-10-CM

## 2021-04-06 DIAGNOSIS — Z86018 Personal history of other benign neoplasm: Secondary | ICD-10-CM

## 2021-04-06 DIAGNOSIS — H02883 Meibomian gland dysfunction of right eye, unspecified eyelid: Secondary | ICD-10-CM

## 2021-04-06 DIAGNOSIS — J301 Allergic rhinitis due to pollen: Secondary | ICD-10-CM | POA: Insufficient documentation

## 2021-04-06 DIAGNOSIS — N1831 Chronic kidney disease, stage 3a: Secondary | ICD-10-CM | POA: Insufficient documentation

## 2021-04-06 DIAGNOSIS — N289 Disorder of kidney and ureter, unspecified: Secondary | ICD-10-CM

## 2021-04-06 DIAGNOSIS — I6930 Unspecified sequelae of cerebral infarction: Secondary | ICD-10-CM | POA: Insufficient documentation

## 2021-04-06 DIAGNOSIS — N200 Calculus of kidney: Secondary | ICD-10-CM

## 2021-04-06 DIAGNOSIS — E213 Hyperparathyroidism, unspecified: Secondary | ICD-10-CM

## 2021-04-06 DIAGNOSIS — R519 Headache, unspecified: Secondary | ICD-10-CM

## 2021-04-06 DIAGNOSIS — R0609 Other forms of dyspnea: Secondary | ICD-10-CM

## 2021-04-06 LAB — IRON BINDING CAPACITY (W/IRON, TRANSFERRIN & TRANSF SAT)
Iron, SRM: 86 ug/dL (ref 31–171)
Total Iron Binding Capacity: 363 ug/dL (ref 270–535)
Transferrin Saturation: 24 % (ref 10–45)
Transferrin: 259 mg/dL (ref 192–382)

## 2021-04-06 LAB — BASIC METABOLIC PANEL
Anion Gap: 8 (ref 4–12)
Calcium: 9.4 mg/dL (ref 8.9–10.2)
Carbon Dioxide, Total: 29 meq/L (ref 22–32)
Chloride: 103 meq/L (ref 98–108)
Creatinine: 1.09 mg/dL — ABNORMAL HIGH (ref 0.38–1.02)
Glucose: 115 mg/dL (ref 62–125)
Potassium: 3.8 meq/L (ref 3.6–5.2)
Sodium: 140 meq/L (ref 135–145)
Urea Nitrogen: 16 mg/dL (ref 8–21)
eGFR by CKD-EPI 2021: 53 mL/min/{1.73_m2} — ABNORMAL LOW (ref >59)

## 2021-04-06 LAB — HEPATITIS B SURFACE AB
Hepatitis B Surf Antibody Intl Units: <8 m[IU]/mL
Hepatitis B Surface Ab: NONREACTIVE

## 2021-04-06 LAB — CBC (HEMOGRAM)
Hematocrit: 46 % — ABNORMAL HIGH (ref 36.0–45.0)
Hemoglobin: 15 g/dL (ref 11.5–15.5)
MCH: 30.4 pg (ref 27.3–33.6)
MCHC: 32.8 g/dL (ref 32.2–36.5)
MCV: 93 fL (ref 81–98)
Platelet Count: 226 10*3/uL (ref 150–400)
RBC: 4.93 10*6/uL (ref 3.80–5.00)
RDW-CV: 13.2 % (ref 11.0–14.5)
WBC: 6.21 10*3/uL (ref 4.3–10.0)

## 2021-04-06 LAB — VITAMIN B12 (COBALAMIN): Vitamin B12 (Cobalamin): 220 pg/mL (ref 180–914)

## 2021-04-06 LAB — FERRITIN: Ferritin: 50 ng/mL (ref 10–180)

## 2021-04-06 MED ORDER — IPRATROPIUM BROMIDE 0.03 % NA SOLN
2.0000 | Freq: Two times a day (BID) | NASAL | 1 refills | Status: DC
Start: 2021-04-06 — End: 2022-05-15

## 2021-04-06 MED ORDER — CYANOCOBALAMIN 1000 MCG OR TABS
1000.0000 ug | ORAL_TABLET | Freq: Every day | ORAL | 3 refills | Status: DC
Start: 2021-04-06 — End: 2022-01-17

## 2021-04-06 NOTE — Progress Notes (Signed)
Montreal Cognitive Assessment [facility]) administered.  Patient scored 27/30.    Form will be scanned into the chart.

## 2021-04-06 NOTE — Patient Instructions (Signed)
Thank you for choosing [facility] for your care. You saw Ether Griffins, MD, MPH today at [facility]. Please call [phone] option #2 with any urgent questions or concerns.      Lab tests today ground floor    Allergies and sinuses: STOP fluticasone nasal spray  - START ipratropium nasal spray 2 sprays twice daily in morning and evening  - Continue cetirizine (Zyrtec) once daily     Breathing and heart: will work on follow up with Dr Bridgett Larsson cardiology    **RECOMMEND RETIRING FROM DRIVING DUE TO YOUR HEART**    Agree with sleep apnea testing    I'm not sure why your scalp hurts, but okay to go to acupuncture    Eye gland: can use warm moist compresses a few minutes twice daily

## 2021-04-07 ENCOUNTER — Telehealth (HOSPITAL_BASED_OUTPATIENT_CLINIC_OR_DEPARTMENT_OTHER): Payer: Self-pay

## 2021-04-07 NOTE — Telephone Encounter (Signed)
Left message for patient to call [phone] to schedule follow-up appointment.    When patient calls back: please schedule return appt with Dr. Bridgett Larsson.  schedule with Dr. Bridgett Larsson in [facility]

## 2021-04-12 ENCOUNTER — Other Ambulatory Visit (HOSPITAL_BASED_OUTPATIENT_CLINIC_OR_DEPARTMENT_OTHER): Payer: Self-pay | Admitting: Internal Medicine

## 2021-04-12 ENCOUNTER — Encounter (HOSPITAL_BASED_OUTPATIENT_CLINIC_OR_DEPARTMENT_OTHER): Payer: Self-pay | Admitting: Internal Medicine

## 2021-04-12 DIAGNOSIS — I480 Paroxysmal atrial fibrillation: Secondary | ICD-10-CM

## 2021-04-12 MED ORDER — APIXABAN 5 MG OR TABS
5.0000 mg | ORAL_TABLET | Freq: Two times a day (BID) | ORAL | 1 refills | Status: DC
Start: 2021-04-12 — End: 2021-11-03

## 2021-04-16 ENCOUNTER — Other Ambulatory Visit: Payer: Self-pay

## 2021-04-19 ENCOUNTER — Encounter (HOSPITAL_BASED_OUTPATIENT_CLINIC_OR_DEPARTMENT_OTHER): Payer: Self-pay

## 2021-04-24 ENCOUNTER — Encounter (HOSPITAL_COMMUNITY): Payer: Self-pay

## 2021-04-25 ENCOUNTER — Encounter (HOSPITAL_BASED_OUTPATIENT_CLINIC_OR_DEPARTMENT_OTHER): Payer: Self-pay | Admitting: Internal Medicine

## 2021-04-25 ENCOUNTER — Other Ambulatory Visit (HOSPITAL_BASED_OUTPATIENT_CLINIC_OR_DEPARTMENT_OTHER): Payer: Self-pay | Admitting: Cardiovascular Disease

## 2021-04-25 DIAGNOSIS — I2510 Atherosclerotic heart disease of native coronary artery without angina pectoris: Secondary | ICD-10-CM

## 2021-04-27 MED ORDER — ISOSORBIDE MONONITRATE ER 60 MG OR TB24
60.0000 mg | EXTENDED_RELEASE_TABLET | Freq: Every morning | ORAL | 1 refills | Status: DC
Start: 2021-04-27 — End: 2021-09-05

## 2021-04-27 NOTE — Telephone Encounter (Signed)
S/O: Reported 11/15 waking up that morning having had fecal incontinence episode that night. Somewhat difficult to understand recent symptoms per MyChart messages.    Vitamin B12 (Cobalamin)   Date Value Ref Range Status   04/06/2021 220 180 - 914 pg/mL Final     Comment:            Normal:  180-914 pg/mL  Indeterminate:  145-179 pg/mL      Deficient:  <145 pg/mL       No results found for: MMA     A/P: [facility] RN please call to triage, additional history prior to 11/15, recent vital signs; may need to be evaluated in-person (note needs transportation, family available).    Thanks.

## 2021-04-27 NOTE — Telephone Encounter (Signed)
Last visit 10/27/20, RTC in 6 month with Dr. Bridgett Larsson, cardiology.      Please schedule appointment for patient.     Thank you for your assistance.

## 2021-04-28 ENCOUNTER — Telehealth (HOSPITAL_BASED_OUTPATIENT_CLINIC_OR_DEPARTMENT_OTHER): Payer: Self-pay | Admitting: Internal Medicine

## 2021-04-28 NOTE — Telephone Encounter (Signed)
RETURN CALL: Voicemail - General Message      SUBJECT:  General Message     MESSAGE: Patient states she received a call from [facility] at the senior care clinic, please call again per patient request.    Thank you.

## 2021-04-28 NOTE — Telephone Encounter (Signed)
Called to pt and pt's daughter, no answer, LVM with brief message to f/u on pt's condition. Request call back. Will f/u with Mychart mesg.

## 2021-05-15 ENCOUNTER — Encounter (HOSPITAL_COMMUNITY): Payer: Self-pay

## 2021-05-17 ENCOUNTER — Encounter (HOSPITAL_BASED_OUTPATIENT_CLINIC_OR_DEPARTMENT_OTHER): Payer: Self-pay | Admitting: Clinical

## 2021-05-24 NOTE — Telephone Encounter (Signed)
Called and LVM to schedule f/u with Dr. Bridgett Larsson and Dr. Ronalee Red. Direct CB# provided.

## 2021-06-04 ENCOUNTER — Encounter (HOSPITAL_BASED_OUTPATIENT_CLINIC_OR_DEPARTMENT_OTHER): Payer: Self-pay | Admitting: Internal Medicine

## 2021-06-06 ENCOUNTER — Other Ambulatory Visit (HOSPITAL_BASED_OUTPATIENT_CLINIC_OR_DEPARTMENT_OTHER): Payer: Self-pay | Admitting: Cardiovascular Disease

## 2021-06-09 ENCOUNTER — Encounter (HOSPITAL_BASED_OUTPATIENT_CLINIC_OR_DEPARTMENT_OTHER): Payer: Self-pay | Admitting: Cardiovascular Disease

## 2021-06-09 ENCOUNTER — Other Ambulatory Visit (HOSPITAL_BASED_OUTPATIENT_CLINIC_OR_DEPARTMENT_OTHER): Payer: Self-pay | Admitting: Cardiovascular Disease

## 2021-06-09 DIAGNOSIS — R0609 Other forms of dyspnea: Secondary | ICD-10-CM

## 2021-06-13 ENCOUNTER — Other Ambulatory Visit (HOSPITAL_BASED_OUTPATIENT_CLINIC_OR_DEPARTMENT_OTHER): Payer: Self-pay | Admitting: Cardiovascular Disease

## 2021-06-19 ENCOUNTER — Other Ambulatory Visit (HOSPITAL_BASED_OUTPATIENT_CLINIC_OR_DEPARTMENT_OTHER): Payer: Self-pay | Admitting: Internal Medicine

## 2021-06-19 DIAGNOSIS — I2510 Atherosclerotic heart disease of native coronary artery without angina pectoris: Secondary | ICD-10-CM

## 2021-06-21 MED ORDER — LISINOPRIL 5 MG OR TABS
ORAL_TABLET | ORAL | 2 refills | Status: DC
Start: 2021-06-21 — End: 2021-08-31

## 2021-06-21 NOTE — Progress Notes (Addendum)
[facility] SUBSEQUENT CARE VISIT NOTE       Chief Complaint   Patient presents with    Shortness of Breath    Neck Pain    Follow-Up        ID/CC:  Wendy [name] is a 76 year old community-dwelling female being seen for follow up of shortness of breath, also neck pain and other concerns. Last visit with me 04/06/21.     HPI:  Baseline: lives alone at apartment (senior housing) in [place] today by none, daughter on phone for part of visit  Transportation: [place] or not: NO     Concerns today: #1 Continued shortness of breath with walking about 250 feet then needs to stop to rest. Now has wheeled walker with seat, so she is walking more. Can sit down on walker seat when needed. Scheduled for nuclear stress test later this month. Daughter asks about possible next steps. Wendy [name] asks if shortness of breath could be lung-related.    #2 Neck and throat pain. Feels below jaw, behind ears R>L. +post nasal drip. Cetirizine helps. Sometimes uses ipratropium nasal spray.    #3 GERD: more at night. May get up and eat during night, so time between eating and lying down varies. Sleeps on wedge pillow plus has neck cushion, may slip down. Asks about hospital bed. BMs okay. Passes gas frequently. Doesn't feel more hungry than usual.    #4 Daughter asks about medication bubble packs. Wendy [name] generally prefers not to take medications. Sometimes more organized about medications and appointments, sometimes less. Interested in acupuncture.    #5 Scalp itching. Sometimes notices bump/pimple.    Grandchild born 4 months ago.    Patient Active Problem List   Diagnosis    Nephrolithiasis    Hyperparathyroidism ([place])    Osteoporosis without current pathological fracture    Non-functioning R kidney    Essential hypertension    Aortic stenosis, mild    Asthma    Bipolar disorder without psychotic features ([place])    Coronary artery disease    Colon polyps     Depression    Generalized anxiety disorder    Mixed hyperlipidemia    Migraine    History of R parathyroid adenoma    History of R parathyroidectomy (2017)    Multinodular goiter    Vitamin D deficiency    Hx of CABG    Aortic regurgitation    Pulmonary nodule less than 6 cm determined by computed tomography of lung    Abnormal computed tomography of pelvis    Body mass index 40.0-44.9, adult (HCC)    History of R metatarsal fractures    Primary osteoarthritis of right knee    Esophageal dysphagia    Gastroesophageal reflux disease without esophagitis    Diastolic dysfunction    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      amLODIPine 10 MG tablet TAKE ONE TABLET BY  MOUTH ONE TIME DAILY 90 tablet 0    apixaban (Eliquis) 5 MG tablet Take 1 tablet (5 mg) by mouth 2 times a day. Blood thinner for stroke prevention. 180 tablet 1    cetirizine 10 MG tablet Take 1 tablet by mouth daily For allergy symptoms 90 tablet 0    cyanocobalamin 1000 MCG tablet Take 1 tablet (1,000 mcg) by mouth daily. For vitamin B-12 and memory 90 tablet 3    ipratropium 0.03 % nasal spray Spray 2 sprays into each nostril 2 times a day. For nasal congestion. Replaces fluticasone 30 mL 1    isosorbide mononitrate ER 60 MG 24 hr tablet Take 1 tablet (60 mg) by mouth every morning. 30 tablet 1    lisinopril 5 MG tablet TAKE ONE TABLET BY MOUTH ONE TIME DAILY 90 tablet 2    metoprolol succinate ER 50 MG 24 hr tablet Take 1 tablet (50 mg) by mouth daily. (Patient taking differently: Take 25 mg by  mouth daily.) 90 tablet 3    nitroGLYCERIN 0.4 MG SL tablet Place 1 tablet (0.4 mg) under the tongue every 5 minutes as needed for chest pain for up to 3 doses. If no relief, call 911. 25 tablet 1     No current facility-administered medications for this visit.        Review of patient's allergies indicates:  Allergies   Allergen Reactions    Hydrochlorothiazide Skin: Itching and Throat Swelling    Lidocaine Other     Hallucinations    Penicillins Skin: Hives     Reaction in [place] (??)  Has patient had a PCN reaction causing immediate rash, facial/tongue/throat swelling, SOB or lightheadedness with hypotension: Yes  Has patient had a PCN reaction causing severe rash involving mucus membranes or skin necrosis: No  Has patient had a PCN reaction that required hospitalization: No  Has patient had a PCN reaction occurring within the last 10 years: No  If all of the above answers are "NO", then may proceed with Cephalosporin use.      Coconut Fatty Acids      Other reaction(s): Other (See Comments)  Reaction??    Latex Skin: Itching    Losartan Headache and Other     Other reaction(s): Other (See Comments), Unknown  Pt doesn't remember.  High blood pressure    Statins Headache and Other     Other reaction(s): Other (See Comments), Other (See Comments) high blood pressure, ear ache and foggy thinking        Geriatric ROS: per my 04/06/21 note, carried forward  "ADLs: independent  IADLs: transportation as above [paratransit, recommended retiring from driving 46/96/29], food preparation doesn't like to cook, primarily uses microwave. Okay with shopping, managing medications (medi-set) and finances.  Mood: prior diagnosis  Vision: eyeglasses  Hearing: some concerns  Continence: Yes"  CFS-9: 4-5     Social History:  DPOA/Legal Next of Kin/main contact person/main contact #s: daughter Wendy [name], son Vahid    Immunization History   Administered Date(s) Administered    COVID-19 Moderna mRNA 12 yrs and older  07/03/2019, 08/11/2019, 04/14/2020, 10/04/2020    COVID-19 [place] mRNA bivalent booster 12 yrs and older 03/14/2021    Influenza quadrivalent MDCK PF (Flucelvax) 04/18/2019    Influenza quadrivalent PF 07/11/2017    Influenza quadrivalent adjuvanted (Fluad) 03/17/2020, 03/14/2021    Influenza trivalent high-dose 03/04/2018    Pneumococcal conjugate PCV13 (Prevnar 13) 05/21/2014    Pneumococcal polysaccharide PPSV23 (Pneumovax 23) 11/14/2012  Tdap 11/14/2012              PHYSICAL EXAMINATION:  BP (!) 140/55    Pulse 60    Temp (!) 35.9 C (Temporal)    Wt (!) 99.4 kg (219 lb 3.2 oz)    SpO2 98%    BMI 42.81 kg/m    Wt Readings from Last 5 Encounters:   06/22/21 (!) 99.4 kg (219 lb 3.2 oz)   04/06/21 98.9 kg (218 lb)   04/03/21 97.1 kg (214 lb)   03/03/21 99.1 kg (218 lb 6.4 oz)   12/23/20 98.4 kg (217 lb)       GENERAL: pleasant older female. Appearance: well-groomed, stated age, sitting comfortably in chair.  HEENT: HEAD: Scalp dry, flaking, one papule R frontal just lateral to vertex. EARS: bilateral auditory canals clear.  NECK: mild tenderness lateral to bilateral submandibular glands, palpable R thyroid nodule  CV: regular rate and rhythm, normal S1, S2, 3/6 systolic murmur, no QAST/M1D6  LUNGS: unlabored breathing, clear to auscultation bilaterally  Gait: 4-wheeled walker     Labs/Other Results:  Orders Only on 04/06/2021   Component Date Value    Ferritin 04/06/2021 50     Sodium 04/06/2021 140     Potassium 04/06/2021 3.8     Chloride 04/06/2021 103     Carbon Dioxide, Total 04/06/2021 29     Anion Gap 04/06/2021 8     Glucose 04/06/2021 115     Urea Nitrogen 04/06/2021 16     Creatinine 04/06/2021 1.09 (H)     Calcium 04/06/2021 9.4     eGFR by CKD-EPI 2021 04/06/2021 53 (L)     Vitamin B12 (Cobalamin) 04/06/2021 220     Hepatitis B Surface Ab 04/06/2021 Nonreactive     Hepatitis B Surf Antibod* 04/06/2021 <8.00     Hepatitis B Surface Anti* 04/06/2021                       Value:Antibody to the Hepatitis B surface antigen (anti-HBs) was not detected. Anti-HBs develops  after Hepatitis B infection, persist for years, and is the protective antibody to the Hepatitis B virus. Persons with recent Hepatitis B infection may only have antibody to the Hepatitis B core antigen (anti-HBc) as the sole serologic marker of infection.      WBC 04/06/2021 6.21     RBC 04/06/2021 4.93     Hemoglobin 04/06/2021 15.0     Hematocrit 04/06/2021 46 (H)     MCV 04/06/2021 93     MCH 04/06/2021 30.4     MCHC 04/06/2021 32.8     Platelet Count 04/06/2021 226     RDW-CV 04/06/2021 13.2     Transferrin 04/06/2021 259     Iron, SRM 04/06/2021 86     Total Iron Binding Capac* 04/06/2021 363     Transferrin Saturation 04/06/2021 24             ASSESSMENT/PLAN:  1. Dyspnea on exertion: most likely cardiac etiology, 03/2021 lab no anemia, normal ferritin  - Pending cardiac nuclear stress imaging later this month  - Will follow up with cardiology next month  - Briefly d/w daughter stress test will show any changes from 2021 and help determine possible next best steps  - CBC; Future re-check  - XR Chest 2 View; Future    2. Coronary artery disease involving native coronary artery of native heart with other form of angina pectoris ([place])  - Walking as  tolerated, now has wheeled walker with seat  - Pending nuclear stress test later this month  - Continue ACEI, beta-blocker and nitrate  - Note doesn't tolerate statins  - Basic Metabolic Panel; Future  - Lipid Panel; Future  - CBC; Future  - Cardiology follow up next month    3. Gastroesophageal reflux disease without esophagitis  - Recommended minimum 2-hour interval between eating and lying down  - Briefly discussed difficult to get insurance coverage for hospital bed. Family can look into options.    4. Neck pain  - Most likely MSK etiology use warm compresses as needed     5. Hyperparathyroidism ([place])  - Basic Metabolic Panel; Future -- monitor for  hypercalcemia, last seen by endocrine 2021  - US Soft Tissue Head and Neck; Future  - Parathyroid Hormone; Future    6. Multinodular goiter  - TSH w/Reflexive Free T4; Future  - US Soft Tissue Head and Neck; Future    7. Memory difficulties: 04/06/21 MoCA 27/30, abnormal Maze test  - Discussed options for pill packaging, check with insurance for covered pharmacies. Options include [facility], [facility], [facility] (locally), possibly [facility]. Notify me which pharmacy would prefer to use and will send rx.  - Methylmalonic Acid; Future    8. Essential hypertension  - SBP somewhat elevated, not re-checked; continue ACEI, beta-blocker and nitrate  - Basic Metabolic Panel; Future  - Lipid Panel; Future    9. Stage 3a chronic kidney disease [facility]): also note non-functioning R kidney  - Basic Metabolic Panel; Future  - CBC; Future    10. Abnormal glucose  - Basic Metabolic Panel; Future  - Hemoglobin A1C, HPLC; Future    11. Mixed hyperlipidemia  - Doesn't tolerate statins  - Lipid Panel; Future    12. Postsurgical aortocoronary bypass status  - Lipid Panel; Future    13. History of R parathyroidectomy (2017)  14. History of R parathyroid adenoma  - Parathyroid Hormone; Future     Relevant Health Care Maintenance:  - Labs: diabetes screening HbA1c 5.7% 06/23/20 repeat due today; lipids 06/23/20 repeat due today; TSH 08/10/20 repeat today; 25-OH vitamin D normal 07/16/19; vitamin B-12 220 04/06/21 check methylmalonic acid  - Supplements: vitamin B-12  - Immunizations: defer discussion recombinant zoster vaccine to future appointment   - Osteoporosis screening (DEXA): indicated N/A known osteoporosis. Last DEXA 05/2020 next due ~05/2022.  - Abdominal aortic aneurysm screening: indicated NO per USPSTF guideline (female, never smoked)  - Colon cancer screening: indicated YES defer discussion to future appointment due to dyspnea on exertion   - Mammogram/breast cancer screening: indicated YES defer discussion to future appointment due to  dyspnea on exertion     Advance care planning/POLST: DPOA-HC son Wendy [name] and son Tawnya Crook available in EHR    Return in about 2 months (around 08/20/2021) for routine follow-up.               07/05/2021 8:30 AM (Arrive by 8:00 AM) [facility]    07/05/2021 9:30 AM (Arrive by 9:00 AM) [facility]    07/06/2021 8:30 AM (Arrive by 8:00 AM) [facility]    07/06/2021 2:00 PM (Arrive by 1:45 PM) Arman Bogus, MSW [facility]    07/20/2021 3:00 PM (Arrive by 2:45 PM) Martyn Ehrich, MD [facility]         I spent a total of 66 minutes for the patient's care on the date  of the service.         ADDENDUM 06/28/21  Hospital Encounter on 06/22/21   1. XR Chest 2 View    Narrative    EXAMINATION:  XR CHEST 2 VW    CLINICAL INDICATION:   Dyspnea on exertion      Impression    FINDINGS AND IMPRESSION:    Compared to July 18, 2020, is persistent elevation right hemidiaphragm. There is also right base atelectasis.    Lungs are otherwise clear.    Heart and mediastinum are unchanged.    No effusion or pneumothorax.    No acute bony abnormality.       Orders Only on 06/22/21   2. Parathyroid Hormone   Result Value Ref Range    Parathyroid Hormone 106 (H) 12 - 88 pg/mL   3. CBC   Result Value Ref Range    WBC 6.32 4.3 - 10.0 10*3/uL    RBC 4.64 3.80 - 5.00 10*6/uL    Hemoglobin 14.6 11.5 - 15.5 g/dL    Hematocrit 43 36.0 - 45.0 %    MCV 93 81 - 98 fL    MCH 31.5 27.3 - 33.6 pg    MCHC 33.7 32.2 - 36.5 g/dL    Platelet Count 209 150 - 400 10*3/uL    RDW-CV 13.9 11.0 - 14.5 %   4. TSH w/Reflexive Free T4   Result Value Ref Range    TSH with Reflexive Free T4 2.011 0.400 - 5.000 u[IU]/mL   5. Hemoglobin A1C, HPLC   Result Value Ref Range    Hemoglobin A1C 5.8 4.0 - 6.0 %   6. Lipid Panel   Result Value Ref Range    Total Cholesterol 271 (H) <200 mg/dL    Triglyceride 155 (H) <150 mg/dL    HDL Cholesterol 47 >39 mg/dL    Non-HDL  Cholesterol 224 (H) 0 - 159 mg/dL    LDL Cholesterol, NIH Equation 195 (H) <130 mg/dL    Cholesterol/HDL Ratio 5.8     Lipid Panel, Additional Info. (NOTE)    7. Basic Metabolic Panel   Result Value Ref Range    Sodium 139 135 - 145 meq/L    Potassium 3.9 3.6 - 5.2 meq/L    Chloride 105 98 - 108 meq/L    Carbon Dioxide, Total 27 22 - 32 meq/L    Anion Gap 7 4 - 12    Glucose 99 62 - 125 mg/dL    Urea Nitrogen 18 8 - 21 mg/dL    Creatinine 1.12 (H) 0.38 - 1.02 mg/dL    Calcium 9.4 8.9 - 10.2 mg/dL    eGFR by CKD-EPI 2021 52 (L) >59 mL/min/1.73_m2      Known mixed hyperlipidemia, doesn't tolerate statins. Lipid clinic referral re-ordered.   PTH stable, no hypercalcemia at this time. Continue to monitor.   CKD stage 3a stable   HbA1c in pre-diabetes range similar to last year. Re-check 1 year, limit added sugars.   CBC and TSH within normal limits    CXR lungs clear    MyChart result comments sent.     ADDENDUM 06/29/21  Lab Results   Component Value Date    MMA 0.19 06/22/2021     Normal methylmalonic acid level. MyChart result comment sent.

## 2021-06-22 ENCOUNTER — Ambulatory Visit (HOSPITAL_COMMUNITY): Payer: Self-pay

## 2021-06-22 ENCOUNTER — Ambulatory Visit
Admission: RE | Admit: 2021-06-22 | Discharge: 2021-06-22 | Disposition: A | Discharge status: Home / Self Care | Payer: Medicare HMO | Attending: Diagnostic Radiology | Admitting: Diagnostic Radiology

## 2021-06-22 ENCOUNTER — Ambulatory Visit (HOSPITAL_BASED_OUTPATIENT_CLINIC_OR_DEPARTMENT_OTHER): Payer: Medicare HMO | Admitting: Internal Medicine

## 2021-06-22 ENCOUNTER — Other Ambulatory Visit (HOSPITAL_BASED_OUTPATIENT_CLINIC_OR_DEPARTMENT_OTHER): Payer: Self-pay | Admitting: Internal Medicine

## 2021-06-22 ENCOUNTER — Ambulatory Visit (HOSPITAL_BASED_OUTPATIENT_CLINIC_OR_DEPARTMENT_OTHER): Payer: Medicare HMO | Admitting: Clinical

## 2021-06-22 VITALS — BP 140/55 | HR 60 | Temp 96.6°F | Wt 219.2 lb

## 2021-06-22 DIAGNOSIS — R413 Other amnesia: Secondary | ICD-10-CM

## 2021-06-22 DIAGNOSIS — E042 Nontoxic multinodular goiter: Secondary | ICD-10-CM

## 2021-06-22 DIAGNOSIS — F32A Depression, unspecified: Secondary | ICD-10-CM | POA: Insufficient documentation

## 2021-06-22 DIAGNOSIS — R7309 Other abnormal glucose: Secondary | ICD-10-CM | POA: Insufficient documentation

## 2021-06-22 DIAGNOSIS — F4323 Adjustment disorder with mixed anxiety and depressed mood: Secondary | ICD-10-CM

## 2021-06-22 DIAGNOSIS — R0609 Other forms of dyspnea: Secondary | ICD-10-CM

## 2021-06-22 DIAGNOSIS — Z86018 Personal history of other benign neoplasm: Secondary | ICD-10-CM

## 2021-06-22 DIAGNOSIS — M542 Cervicalgia: Secondary | ICD-10-CM

## 2021-06-22 DIAGNOSIS — I25118 Atherosclerotic heart disease of native coronary artery with other forms of angina pectoris: Secondary | ICD-10-CM

## 2021-06-22 DIAGNOSIS — I10 Essential (primary) hypertension: Secondary | ICD-10-CM | POA: Insufficient documentation

## 2021-06-22 DIAGNOSIS — I63511 Cerebral infarction due to unspecified occlusion or stenosis of right middle cerebral artery: Secondary | ICD-10-CM | POA: Insufficient documentation

## 2021-06-22 DIAGNOSIS — N950 Postmenopausal bleeding: Secondary | ICD-10-CM | POA: Insufficient documentation

## 2021-06-22 DIAGNOSIS — E213 Hyperparathyroidism, unspecified: Secondary | ICD-10-CM

## 2021-06-22 DIAGNOSIS — N1831 Chronic kidney disease, stage 3a: Secondary | ICD-10-CM | POA: Insufficient documentation

## 2021-06-22 DIAGNOSIS — K219 Gastro-esophageal reflux disease without esophagitis: Secondary | ICD-10-CM | POA: Insufficient documentation

## 2021-06-22 DIAGNOSIS — E892 Postprocedural hypoparathyroidism: Secondary | ICD-10-CM

## 2021-06-22 DIAGNOSIS — Z951 Presence of aortocoronary bypass graft: Secondary | ICD-10-CM | POA: Insufficient documentation

## 2021-06-22 DIAGNOSIS — F411 Generalized anxiety disorder: Secondary | ICD-10-CM | POA: Insufficient documentation

## 2021-06-22 DIAGNOSIS — E782 Mixed hyperlipidemia: Secondary | ICD-10-CM | POA: Insufficient documentation

## 2021-06-22 DIAGNOSIS — F319 Bipolar disorder, unspecified: Secondary | ICD-10-CM | POA: Insufficient documentation

## 2021-06-22 LAB — BASIC METABOLIC PANEL
Anion Gap: 7 (ref 4–12)
Calcium: 9.4 mg/dL (ref 8.9–10.2)
Carbon Dioxide, Total: 27 meq/L (ref 22–32)
Chloride: 105 meq/L (ref 98–108)
Creatinine: 1.12 mg/dL — ABNORMAL HIGH (ref 0.38–1.02)
Glucose: 99 mg/dL (ref 62–125)
Potassium: 3.9 meq/L (ref 3.6–5.2)
Sodium: 139 meq/L (ref 135–145)
Urea Nitrogen: 18 mg/dL (ref 8–21)
eGFR by CKD-EPI 2021: 52 mL/min/{1.73_m2} — ABNORMAL LOW (ref >59)

## 2021-06-22 LAB — CBC (HEMOGRAM)
Hematocrit: 43 % (ref 36.0–45.0)
Hemoglobin: 14.6 g/dL (ref 11.5–15.5)
MCH: 31.5 pg (ref 27.3–33.6)
MCHC: 33.7 g/dL (ref 32.2–36.5)
MCV: 93 fL (ref 81–98)
Platelet Count: 209 10*3/uL (ref 150–400)
RBC: 4.64 10*6/uL (ref 3.80–5.00)
RDW-CV: 13.9 % (ref 11.0–14.5)
WBC: 6.32 10*3/uL (ref 4.3–10.0)

## 2021-06-22 LAB — LIPID PANEL
Cholesterol/HDL Ratio: 5.8
HDL Cholesterol: 47 mg/dL (ref >39)
LDL Cholesterol, NIH Equation: 195 mg/dL — ABNORMAL HIGH (ref <130)
Non-HDL Cholesterol: 224 mg/dL — ABNORMAL HIGH (ref 0–159)
Total Cholesterol: 271 mg/dL — ABNORMAL HIGH (ref <200)
Triglyceride: 155 mg/dL — ABNORMAL HIGH (ref <150)

## 2021-06-22 LAB — PARATHYROID HORMONE: Parathyroid Hormone: 106 pg/mL — ABNORMAL HIGH (ref 12–88)

## 2021-06-22 LAB — TSH WITH REFLEXIVE FREE T4: TSH with Reflexive Free T4: 2.011 u[IU]/mL (ref 0.400–5.000)

## 2021-06-22 LAB — HEMOGLOBIN A1C, HPLC: Hemoglobin A1C: 5.8 % (ref 4.0–6.0)

## 2021-06-22 NOTE — Patient Instructions (Addendum)
Thank you for choosing [facility] for your care. You saw Ether Griffins, MD, MPH today at [facility]. Please call [phone] option #2 with any urgent questions or concerns.      After your visit:  - Stop by x-ray on 1st floor for chest X-ray and schedule ultrasound  - Then lab on ground floor    Breathing: chest X-ray today  Stress test as scheduled   Follow up with cardiology Dr Bridgett Larsson as scheduled next month    Neck pain: toward back is muscle-related. Can use something warm as needed (don't burn yourself).  - Ultrasound to be scheduled to check thyroid nodules and glands in neck    GERD: recommend about 2 hours between eating and lying down    Hospital bed: difficult to get Medicare coverage, can keep in mind for future. Reasonable to look into donation options.    Acupuncture: check with your Medicare Advantage plan if they cover acupuncture -- possible they do    Pharmacy medication pill packs: check which pharmacies are contracted with your insurance  Options may include  [facility]: [email or web address]  [facility]? (I'm having difficulty finding information currently)  [facility] (local pharmacy): [email or web address]  [facility]: http://cole-[email or web address]    Scalp: can look for [employer]'s tea tree tingle shampoo with tea tree oil  https://www.[email or web address]/product/trader-joes-tea-tree-tingle-shampoo-[email or web address]

## 2021-06-22 NOTE — Progress Notes (Signed)
BHIP FOLLOW-UP NOTE   Type of Service:Psychotherapy  Duration of session:41mn  In-person appointment    CURRENT MENTAL HEALTH CONCERNS/REASON(S) FOR VISIT:  Patient is a 76year old woman who prefers to be called Wendy [name] She wasreferred by Dr. HRonalee Red her PCP in [facility],toBHIPfor treatment of depression. 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    Interval History:Today is our first visit for several months. PCyarahas hard a hard time sleeping, felt anxious, and has been struggling with rumination about a variety of topics. Her ex-husband [place] his current wife [place]. She, and the rest of her family, are still processing this.     PHQ9:12  GAD7:11    Risk Assessment:No SI, HI or current safety concerns reported    Assessment of Progress or Regression and Expected Treatment Outcomes:Patient would benefit from continued treatment. Plan is to meet through May 2023. Reconfirmed today.    DSM5Diagnoses:Adjustment disorder     Intervention/Plan:Over the next two weeks PMargrettwants to work on processing the news about her ex-husband. She does not want psychiatric medication. She wants to think about actions and habits within her control that are good for her brain and her mood. She's considering a mScientist, research (physical sciences) She's done Tai Chi in the past. She would like to eat a little healthier. She wants to think about whether she has a good balance of time watching tv, time spent with others in her building, time spent with family/friends outside of her building.  Care Coordinator discussed [facility] and treatment alternatives.  No currentpsychiatric medication. If patient and her team decide to try psychiatric meds in the future she should be scheduled with [facility] Dr. SGeorgette Dover  givenhercomplicated experiences in the past with psychiatric meds.  Counseling interventions:Supportive psychotherapy  Substance use interventions (if applicable):None  Patient will follow-up with:Care Coordinatorin 14 Days    The patient is able to participate in treatment and [facility].

## 2021-06-28 ENCOUNTER — Other Ambulatory Visit (HOSPITAL_BASED_OUTPATIENT_CLINIC_OR_DEPARTMENT_OTHER): Payer: Self-pay | Admitting: Internal Medicine

## 2021-06-28 ENCOUNTER — Encounter (HOSPITAL_BASED_OUTPATIENT_CLINIC_OR_DEPARTMENT_OTHER): Payer: Self-pay | Admitting: Internal Medicine

## 2021-06-28 DIAGNOSIS — I25118 Atherosclerotic heart disease of native coronary artery with other forms of angina pectoris: Secondary | ICD-10-CM

## 2021-06-28 DIAGNOSIS — E782 Mixed hyperlipidemia: Secondary | ICD-10-CM

## 2021-06-28 LAB — METHYLMALONIC ACID: Methylmalonic Acid: 0.19 mcmol/L (ref <0.30)

## 2021-06-28 NOTE — Assessment & Plan Note (Signed)
Lipid clinic referral

## 2021-07-02 ENCOUNTER — Encounter (HOSPITAL_BASED_OUTPATIENT_CLINIC_OR_DEPARTMENT_OTHER): Payer: Self-pay | Admitting: Internal Medicine

## 2021-07-02 DIAGNOSIS — I10 Essential (primary) hypertension: Secondary | ICD-10-CM

## 2021-07-03 ENCOUNTER — Encounter (HOSPITAL_BASED_OUTPATIENT_CLINIC_OR_DEPARTMENT_OTHER): Payer: Self-pay | Admitting: Internal Medicine

## 2021-07-03 MED ORDER — AMLODIPINE BESYLATE 10 MG OR TABS
10.0000 mg | ORAL_TABLET | Freq: Every day | ORAL | 2 refills | Status: DC
Start: 2021-07-03 — End: 2021-09-22

## 2021-07-03 NOTE — Telephone Encounter (Signed)
This encounter was opened in error.

## 2021-07-03 NOTE — Telephone Encounter (Signed)
RN please notify amlodipine was refilled today 07/03/21. No other more recent refill request for amlodipine.    Thanks.

## 2021-07-05 ENCOUNTER — Other Ambulatory Visit (HOSPITAL_COMMUNITY): Payer: Medicare HMO

## 2021-07-05 ENCOUNTER — Inpatient Hospital Stay (HOSPITAL_COMMUNITY)
Admit: 2021-07-05 | Discharge: 2021-07-05 | Disposition: A | Discharge status: Home / Self Care | Payer: Medicare HMO | Source: Home / Self Care

## 2021-07-05 ENCOUNTER — Other Ambulatory Visit (HOSPITAL_BASED_OUTPATIENT_CLINIC_OR_DEPARTMENT_OTHER): Payer: Medicare HMO

## 2021-07-05 ENCOUNTER — Inpatient Hospital Stay
Admission: RE | Admit: 2021-07-05 | Discharge: 2021-07-05 | Disposition: A | Discharge status: Home / Self Care | Payer: Medicare HMO | Attending: Nuclear Medicine | Admitting: Nuclear Medicine

## 2021-07-05 DIAGNOSIS — I10 Essential (primary) hypertension: Secondary | ICD-10-CM | POA: Insufficient documentation

## 2021-07-05 DIAGNOSIS — R9431 Abnormal electrocardiogram [ECG] [EKG]: Secondary | ICD-10-CM

## 2021-07-05 DIAGNOSIS — I440 Atrioventricular block, first degree: Secondary | ICD-10-CM

## 2021-07-05 DIAGNOSIS — Z951 Presence of aortocoronary bypass graft: Secondary | ICD-10-CM | POA: Insufficient documentation

## 2021-07-05 DIAGNOSIS — I25118 Atherosclerotic heart disease of native coronary artery with other forms of angina pectoris: Secondary | ICD-10-CM | POA: Insufficient documentation

## 2021-07-05 LAB — STRESS TEST (NUCMED ONLY)
Max HR (bpm): 146 {beats}/min
Min diagnostic HR (bpm): 124 {beats}/min
Min diagnostic HR (bpm): 88 {beats}/min

## 2021-07-05 MED ORDER — REGADENOSON 0.4 MG/5ML IV SOLN
0.4000 mg | Freq: Once | INTRAVENOUS | Status: AC | PRN
Start: 2021-07-05 — End: 2021-07-05
  Administered 2021-07-05: 0.4 mg via INTRAVENOUS

## 2021-07-05 MED ORDER — TECHNETIUM TC-99M TETROFOSMIN UWM INJECTION
48.0000 | Freq: Once | Status: DC | PRN
Start: 2021-07-05 — End: 2021-07-06
  Administered 2021-07-05: 48.6 via INTRAVENOUS

## 2021-07-05 NOTE — Brief Procedure Note (Signed)
Lexiscan Pharmacologic Stress Test Note    Type of Stress Test: Lexiscan (Regadenoson)    Wendy [name]  DOB: [birth year]  Age:  76 year old  Ordering Physician: Marisa Sprinkles    Indications: DOE, s/p CABG    WEIGHT:219#  Height:  60"    Medications:  ACEI/ARB  Beta blocker. Held Yes  NTG  Ca+ channel blocker     Risk Factors:  HTN  Prior Cath  Prior CABG  Prior PCI     Resting BP: 144/63         Min BP: 129/71                  Resting HR: 57                Max HR: 81                     Medications administered:  0.4mg  of Lexiscan (Regadenoson) infused over 10 seconds  48.6  mCi of Tc-40M (Myoview) injected 30 seconds following Lexiscan injection  0mg  Aminophylline infused     Patient asymptomatic at end of study: Yes  Comments: None

## 2021-07-06 ENCOUNTER — Ambulatory Visit (HOSPITAL_BASED_OUTPATIENT_CLINIC_OR_DEPARTMENT_OTHER): Payer: Medicare HMO | Admitting: Clinical

## 2021-07-06 ENCOUNTER — Inpatient Hospital Stay (HOSPITAL_COMMUNITY)
Admit: 2021-07-06 | Discharge: 2021-07-06 | Disposition: A | Discharge status: Home / Self Care | Payer: Medicare HMO | Source: Home / Self Care

## 2021-07-06 MED ORDER — TECHNETIUM TC-99M TETROFOSMIN UWM INJECTION
45.0000 | Freq: Once | Status: DC | PRN
Start: 2021-07-06 — End: 2021-07-07
  Administered 2021-07-06: 45 via INTRAVENOUS

## 2021-07-07 ENCOUNTER — Encounter (HOSPITAL_COMMUNITY): Payer: Self-pay | Admitting: Internal Medicine

## 2021-07-07 ENCOUNTER — Other Ambulatory Visit (HOSPITAL_COMMUNITY): Payer: Self-pay | Admitting: Internal Medicine

## 2021-07-07 ENCOUNTER — Encounter (HOSPITAL_BASED_OUTPATIENT_CLINIC_OR_DEPARTMENT_OTHER): Payer: Self-pay

## 2021-07-07 ENCOUNTER — Encounter (HOSPITAL_BASED_OUTPATIENT_CLINIC_OR_DEPARTMENT_OTHER): Payer: Self-pay | Admitting: Cardiovascular Disease

## 2021-07-07 DIAGNOSIS — I2510 Atherosclerotic heart disease of native coronary artery without angina pectoris: Secondary | ICD-10-CM

## 2021-07-07 LAB — EKG 12 LEAD
Atrial Rate: 65 {beats}/min
Atrial Rate: 78 {beats}/min
P Axis: 71 degrees
P Axis: 73 degrees
P-R Interval: 222 ms
P-R Interval: 224 ms
Q-T Interval: 394 ms
Q-T Interval: 432 ms
QRS Duration: 90 ms
QRS Duration: 92 ms
QTC Calculation: 449 ms
QTC Calculation: 449 ms
R Axis: 79 degrees
R Axis: 85 degrees
T Axis: 119 degrees
T Axis: 139 degrees
Ventricular Rate: 65 {beats}/min
Ventricular Rate: 78 {beats}/min

## 2021-07-07 NOTE — Progress Notes (Signed)
I called Ms. 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 She remains debilitated and can only walk 250 steps (with a walker) without stopping due to severe dyspnea.     We have discussed with risks and benefits of percutaneous revascularization with the patient. Risk of the procedure include bleeding, infection, vascular or nerve injury, renal injury, anaphylaxis, intubation, arrhythmia, stroke, myocardial infarction, need for emergent surgery, and death. Benefits include relief of symptoms, improvement in cardiac function, and potentially improved survival. The patient indicated understanding of these risks and benefits and wishes to proceed.    We will schedule a coronary angiogram as the next step. If her disease is readily approachable then we can proceed with PCI. If she is found to have more complex disease and/or CTO, then further consultations may be necessary to fully convey a procedural plan and the associated risks.    Discussed with Dr. [name].    Alexia Freestone, MD, MSc  Interventional Cardiology Fellow  [phone]

## 2021-07-07 NOTE — Telephone Encounter (Signed)
Called patient and left voicemail to call back to my direct number to schedule follow up with Dr. [name] to go over stress test results.     Abel Presto  Patient Services Specialist  [employer]  [phone]

## 2021-07-10 ENCOUNTER — Encounter (HOSPITAL_BASED_OUTPATIENT_CLINIC_OR_DEPARTMENT_OTHER): Payer: Self-pay | Admitting: Cardiovascular Disease

## 2021-07-10 ENCOUNTER — Other Ambulatory Visit: Payer: Self-pay

## 2021-07-10 NOTE — Telephone Encounter (Signed)
Thanks, I responded that they should not delay the procedure to see me.

## 2021-07-10 NOTE — Telephone Encounter (Addendum)
MRN: [id number]  NAME:Wendy [name]  DOB:[birth year]  IPPGF:[phone]   At your earliest convenience, please call      lvm FOR THE patient to call me back   Scheduled the patient next 0800 for a prcd at 1000     Communicated to the patient that no c19 is needed unless they have symptoms and they will be swabbed right here.    The nurse will call today through Friday

## 2021-07-10 NOTE — Nursing Note (Signed)
Pre-Procedure Instructions for Percutaneous Coronary Intervention     Thank you for selecting [facility]) for your procedure.     Our address: [place]    Procedure Date and Check-in Information    Date:  February 6                                  Check-in Time:  8:00 AM     Check-in Location: [facility]MIT[facility] check in - 2nd Floor   Address: [address], [place] [address]       Use the main hospital entrance on [address].   Once you enter the main lobby of [facility], turn right and take the  [place] to [address] down to the 2nd floor. Turn left as you exit the elevator.    Check in at [facility] .    General Instructions       ? PLEASE BRING A CURRENT LIST OF ALL MEDICATIONS THE DAY OF PROCEDURE  ?  Do not eat or drink 6 hours prior to your check in time. You may take sips of water until 2 hours prior to the check in time.   ? Do not eat high fat foods such as bacon,sausage, or eggs for at least 8 hrs before your check in time .   ? Please take your medications as prescribed .  ? Avoid any other over the counter supplements at least 5 days prior to procedure.   ? Do not take lisinopril morning of procedure .     ? If you are taking anticoagulants .   ? Hold  apixaban (Eliquis) 2 days (48 hrs)prior to your procedure. Do not take Eliquis on February 4, 5 and morning of 6th     ? If you use a CPAP machine at home, please bring this with you to use during the procedure and for your overnight stay.    ? Follow up appointments must be scheduled with your primary cardiologist within 2-4 weeks after your procedure. Please direct any medication refill requests to your primary cardiologist and procedure records and images will be sent directly to the referring cardiologist approximately 1 week after your procedure.   ? Most patients typically spend one night in the hospital after the procedure then Christinna Sprung home the next morning. You will need  someone to drive you home. Please wait 24-48 hrs after procedure before driving.  You cannot drive yourself home or take a bus, taxi or shuttle by yourself.      If you have any clinical questions regarding your procedure, please call [facility] at [phone].  Our afterhours number is [phone].       __________________________________________________________________________________  PRE CATH SCREENING QUESTIONS :      YES NO Notes    []   []       []   [x]      Any travel (Domestic or international) in the last month? []   [x]      Any Allergy to contrast dye/iodine?   If yes: Pretreatment ordered? YES []   NO []   []   [x]      History or suspected OSA? (Snore, Tired, Apnea, HTN, BMI>35, Age >97, Female) [x]   []   Pending sleep study fitting for CPAP   Uses/Compliant with CPAP?   If yes: Bring CPAP  If no: Consult Anesthesia on day of procedure  []   [  x]     Home O2? []   [x]      History of COPD?  If with advanced lung disease on Home O2, consult Anesthesia on day of procedure. []   [x]      Able to walk 2 blocks or climb 2 flights of stairs without getting SOB? []   [x]   SOB    Able to lay flat without getting SOB? [x]   []   Needs wedge under her knees   Any problems with sedation or anesthesia in the past? []   [x]      Have you been told by an anesthesiologist that you have a difficult airway or that they consider you to be a difficult intubation? []   [x]      Long term opioids for chronic pain? []   [x]      EF >20%? []   []   Most Recent EF:59%    ESRD? []   [x]   Only has left kidney functioning    ESLD? []   [x]      Pulmonary HTN? (PAS >40mmHg) []   [x]      Neurological or neuromuscular disorders compromising respiratory or swallowing functions? []   [x]      Are you taking any Antiplatelets?  ASA, Clopidogrel (Plavix), prasugrel (Effient), Ticagrelor (Brilinta) []   [x]      Are you taking any Anticoagulants?  Warfarin (Coumadin), dabigatran (Pradaxa), apixaban (Eliquis), rivaroxaban (xarelto) [x]   []   Eliquis- PAF    Are you taking any Diabetic medications?  Insulin, Metformin []   [x]      Are you taking any diuretics? []   [x]      Have you fallen in the last 12 months? []   [x]      Do you use any assistive devices with ambulation?  Cane, walker, wheelchair [x]   []      Do you have a reliable ride home? [x]   []            Estimated body mass index is 42.81 kg/m as calculated from the following:    Height as of 04/03/21: 5' (1.524 m).    Weight as of 06/22/21: 99.4 kg (219 lb 3.2 oz).       BMI 40-45 = sedating RN to assess ( if anesthesia needed , will be scheduled day of)      BMI > 45 = anesthesia required ( schedule w/ pre-anesthesia clinic)        Screening Tool for Moderate Sedation Anesthesia Consultation      Absolute Contraindications: Schedule Procedure with Anesthesia!    []  Morbid Obesity BMI >45    []  Pregnancy >[redacted] weeks gestation    []  Previous adverse reaction to sedation / anesthesia    []  Known difficult airway for ventilation or intubation     []  Neurological or neuromuscular disorders compromising respiratory or swallowing function     []  ASA Class V                    Mallampati Class III & IV = Potential difficult intubation (Consider anesthesia consult)    Mandatory Consults: If Consult indicated: Consult will be done day of procedure.     []  OSA non-compliant with CPAP treatment    [x]  BMI 40-45 (flag for on arrival stop bang) and STOP-BANG score >4    []  Advanced lung disease on home oxygen    []  Severe Aortic Stenosis (Valve Area <0.8 cm2)  =  EXCEPTION for CATH PROCEDURES     []  Unintubated TBI (GCS <15: proceed with caution, GCS 10-12: Anesthesia Consult    []   End-Stage Renal Failure with:  Known OSA and non-compliant with CPAP         OR Obesity (BMI 40-45 with STOP-BANG >4) OR Diabetes on insulin infusion    []  End-Stage Liver Disease with:  Encephalopathy requiring treatment          OR #2 of the following:  Childs C, MELD >15, Na <128       SCREENING OUTCOME:     [x]  Sedating RN to assess    []   Cleared for Moderate Sedation       []  Anesthesia Required    []  Anesthesia Consult Required

## 2021-07-11 ENCOUNTER — Ambulatory Visit: Payer: Medicare HMO | Attending: Clinical | Admitting: Clinical

## 2021-07-11 DIAGNOSIS — N950 Postmenopausal bleeding: Secondary | ICD-10-CM | POA: Insufficient documentation

## 2021-07-11 DIAGNOSIS — F319 Bipolar disorder, unspecified: Secondary | ICD-10-CM | POA: Insufficient documentation

## 2021-07-11 DIAGNOSIS — F32A Depression, unspecified: Secondary | ICD-10-CM | POA: Insufficient documentation

## 2021-07-11 DIAGNOSIS — F411 Generalized anxiety disorder: Secondary | ICD-10-CM | POA: Insufficient documentation

## 2021-07-11 DIAGNOSIS — F4323 Adjustment disorder with mixed anxiety and depressed mood: Secondary | ICD-10-CM | POA: Insufficient documentation

## 2021-07-11 DIAGNOSIS — I63511 Cerebral infarction due to unspecified occlusion or stenosis of right middle cerebral artery: Secondary | ICD-10-CM | POA: Insufficient documentation

## 2021-07-12 NOTE — Progress Notes (Signed)
BHIP FOLLOW-UP NOTE   Type of Service:Psychotherapy  Duration of session:76min  Distant Site Telemedicine Encounterpires 11/29/20):999}  I conducted this encounter via secure, live, face-to-face video conference with the patient. I reviewed the risks and benefits of telemedicine as pertinent to this visit and the patient agreed to proceed.    Provider [place] location (clinic, hospital, on-site office)[facility]  Patient Location:At home  Present with patient:None   Patient's location during encounter:Home, see address in [facility]  Emergency contact name and telephone number reviewed in patient chart and is up to date:Emergency contactislisted in patient chart. Not reviewed with patient today.  In addition to the patient and the provider, the following others were present during the encounter:None  Prior toinitialinterview, the provider verified the patient's identity by asking for his or her name and date of birth. The provider informed the patient of their physical location and showed his or her badge. No recordings are kept from this encounter.  Emergency plan:  In the event of an emergency, the provider may ask the patient and/or family member/caregiver to contact 911. If it is not possible for the patient or someone at their location to contact 911, the provider will contact 911 and provide the patient's location. The patienthas beeninformed of this safety plan and verbally consented to it.   This visit was conducted via telehealth instead of face to face because of the risk of COVID-19 exposure inherent in being physically present in the company of others.    CURRENT MENTAL HEALTH CONCERNS/REASON(S) FOR VISIT:  Patient is a 76year old woman who prefers to be called Wendy [name]. She wasreferred by Dr. Ronalee Red, [facility]CP in [facility],toBHIPfor treatment of depression. 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    Interval History:Wendy [name] feels out of place in her senior building. She thinks she takes it too personally when people don't like her. She thinks she crosses boundaries, naturally learning a lot of personal details about people quickly before they might be comfortable with it. She thinks she has difficulty trusting people due to some past experiences. She's reconnected with a [employer] group by phone which has been helpful. She has connected with an old friend.     PHQ9:Not completed  GAD7:Not completed  Sent via MyChart    Risk Assessment:No SI, HI or current safety concerns reported    Assessment of Progress or Regression and Expected Treatment Outcomes:Patient would benefit from continued treatment. Plan is to meet through May 2023.     DSM5Diagnoses:Adjustment disorder     Intervention/Plan:Next appt in two weeks  Care Coordinator discussed [place] [facility]) and treatment alternatives.  No currentpsychiatric medication. If patient and her team decide to try psychiatric meds in the future she should be scheduled with [facility] psychiatrist Dr. Georgette Dover, givenhercomplicated experiences in the past with psychiatric meds.  Counseling interventions:Supportive psychotherapy  Substance use interventions (if applicable):None  Patient will follow-up with:Care Coordinatorin 14 Days    The patient is able to participate in treatment and consents to the [facility] treatment plan.

## 2021-07-13 ENCOUNTER — Encounter (HOSPITAL_BASED_OUTPATIENT_CLINIC_OR_DEPARTMENT_OTHER): Payer: Self-pay | Admitting: Nurse Practitioner

## 2021-07-13 ENCOUNTER — Encounter (HOSPITAL_BASED_OUTPATIENT_CLINIC_OR_DEPARTMENT_OTHER): Payer: Self-pay | Admitting: Physician Assistant

## 2021-07-13 DIAGNOSIS — Z01810 Encounter for preprocedural cardiovascular examination: Secondary | ICD-10-CM

## 2021-07-17 ENCOUNTER — Other Ambulatory Visit: Payer: Self-pay

## 2021-07-17 ENCOUNTER — Encounter (HOSPITAL_BASED_OUTPATIENT_CLINIC_OR_DEPARTMENT_OTHER): Payer: Self-pay | Admitting: Internal Medicine

## 2021-07-17 ENCOUNTER — Other Ambulatory Visit (HOSPITAL_BASED_OUTPATIENT_CLINIC_OR_DEPARTMENT_OTHER): Payer: Medicare HMO

## 2021-07-17 ENCOUNTER — Observation Stay
Admission: RE | Admit: 2021-07-17 | Discharge: 2021-07-18 | Disposition: A | Discharge status: Home / Self Care | Payer: Medicare HMO | Attending: Cardiovascular Disease | Admitting: Cardiovascular Disease

## 2021-07-17 ENCOUNTER — Encounter (HOSPITAL_COMMUNITY)
Admission: RE | Disposition: A | Discharge status: Home / Self Care | Payer: Self-pay | Source: Home / Self Care | Attending: Cardiovascular Disease

## 2021-07-17 ENCOUNTER — Observation Stay (HOSPITAL_COMMUNITY): Payer: Medicare HMO | Admitting: Cardiovascular Disease

## 2021-07-17 DIAGNOSIS — R001 Bradycardia, unspecified: Secondary | ICD-10-CM

## 2021-07-17 DIAGNOSIS — I2510 Atherosclerotic heart disease of native coronary artery without angina pectoris: Secondary | ICD-10-CM | POA: Insufficient documentation

## 2021-07-17 DIAGNOSIS — Z9861 Coronary angioplasty status: Secondary | ICD-10-CM | POA: Insufficient documentation

## 2021-07-17 DIAGNOSIS — I25118 Atherosclerotic heart disease of native coronary artery with other forms of angina pectoris: Principal | ICD-10-CM | POA: Insufficient documentation

## 2021-07-17 DIAGNOSIS — E785 Hyperlipidemia, unspecified: Secondary | ICD-10-CM | POA: Insufficient documentation

## 2021-07-17 DIAGNOSIS — I480 Paroxysmal atrial fibrillation: Secondary | ICD-10-CM | POA: Insufficient documentation

## 2021-07-17 DIAGNOSIS — I25709 Atherosclerosis of coronary artery bypass graft(s), unspecified, with unspecified angina pectoris: Secondary | ICD-10-CM | POA: Insufficient documentation

## 2021-07-17 DIAGNOSIS — Z01810 Encounter for preprocedural cardiovascular examination: Secondary | ICD-10-CM | POA: Insufficient documentation

## 2021-07-17 DIAGNOSIS — I2582 Chronic total occlusion of coronary artery: Secondary | ICD-10-CM | POA: Insufficient documentation

## 2021-07-17 DIAGNOSIS — R69 Illness, unspecified: Secondary | ICD-10-CM | POA: Insufficient documentation

## 2021-07-17 DIAGNOSIS — I25718 Atherosclerosis of autologous vein coronary artery bypass graft(s) with other forms of angina pectoris: Secondary | ICD-10-CM | POA: Insufficient documentation

## 2021-07-17 DIAGNOSIS — R9431 Abnormal electrocardiogram [ECG] [EKG]: Secondary | ICD-10-CM | POA: Insufficient documentation

## 2021-07-17 DIAGNOSIS — Z20822 Contact with and (suspected) exposure to covid-19: Secondary | ICD-10-CM | POA: Insufficient documentation

## 2021-07-17 DIAGNOSIS — I10 Essential (primary) hypertension: Secondary | ICD-10-CM | POA: Insufficient documentation

## 2021-07-17 DIAGNOSIS — I440 Atrioventricular block, first degree: Secondary | ICD-10-CM | POA: Insufficient documentation

## 2021-07-17 HISTORY — PX: PCI (PERCUTANEOUS CORONARY INTERVENTION): CATH112

## 2021-07-17 LAB — ACT LOW RANGE (POC), ~~LOC~~
ACT Low Range (POC): 331 s
ACT Low Range (POC): >400 s
ACT Low Range (POC): 358 s
ACT Low Range (POC): 361 s
ACT Low Range (POC): 327 s
ACT Low Range (POC): 348 s
ACT Low Range (POC): 345 s
ACT Low Range (POC): 370 s
ACT Low Range (POC): 144 s

## 2021-07-17 LAB — CBC (HEMOGRAM)
Hematocrit: 40 % (ref 36.0–45.0)
Hemoglobin: 13.4 g/dL (ref 11.5–15.5)
MCH: 30.9 pg (ref 27.3–33.6)
MCHC: 33.7 g/dL (ref 32.2–36.5)
MCV: 92 fL (ref 81–98)
Platelet Count: 197 10*3/uL (ref 150–400)
RBC: 4.34 10*6/uL (ref 3.80–5.00)
RDW-CV: 13.6 % (ref 11.0–14.5)
WBC: 5.83 10*3/uL (ref 4.3–10.0)

## 2021-07-17 LAB — BASIC METABOLIC PANEL
Anion Gap: 9 (ref 4–12)
Calcium: 9.3 mg/dL (ref 8.9–10.2)
Carbon Dioxide, Total: 25 meq/L (ref 22–32)
Chloride: 106 meq/L (ref 98–108)
Creatinine: 1.03 mg/dL — ABNORMAL HIGH (ref 0.38–1.02)
Glucose: 103 mg/dL (ref 62–125)
Potassium: 4 meq/L (ref 3.6–5.2)
Sodium: 140 meq/L (ref 135–145)
Urea Nitrogen: 20 mg/dL (ref 8–21)
eGFR by CKD-EPI 2021: 57 mL/min/{1.73_m2} — ABNORMAL LOW (ref >59)

## 2021-07-17 LAB — PROTHROMBIN TIME
Prothrombin INR: 1 (ref 0.8–1.3)
Prothrombin Time Patient: 13.3 s (ref 10.7–15.6)

## 2021-07-17 LAB — EKG 12 LEAD
Atrial Rate: 61 {beats}/min
P Axis: -7 degrees
P-R Interval: 248 ms
Q-T Interval: 414 ms
QRS Duration: 92 ms
QTC Calculation: 416 ms
R Axis: -13 degrees
T Axis: 212 degrees
Ventricular Rate: 61 {beats}/min

## 2021-07-17 SURGERY — PCI/CTO
Anesthesia: Moderate Sedation | Site: Coronary

## 2021-07-17 MED ORDER — SODIUM CHLORIDE 0.9 % IV SOLN
3.0000 mL/h | INTRAVENOUS | Status: DC
Start: 2021-07-17 — End: 2021-07-18

## 2021-07-17 MED ORDER — IOHEXOL 350 MG/ML IV SOLN
INTRAVENOUS | Status: AC
Start: 2021-07-17 — End: 2021-07-17
  Filled 2021-07-17: qty 100

## 2021-07-17 MED ORDER — HEPARIN SODIUM (PORCINE) 1000 UNIT/ML IJ SOLN
INTRAMUSCULAR | Status: DC | PRN
Start: 2021-07-17 — End: 2021-07-17
  Administered 2021-07-17: 10000 [IU] via INTRAVENOUS
  Administered 2021-07-17: 3000 [IU] via INTRAVENOUS
  Administered 2021-07-17 (×3): 2000 [IU] via INTRAVENOUS

## 2021-07-17 MED ORDER — ATROPINE SULFATE 1 MG/10ML IJ SOSY
0.5000 mg | PREFILLED_SYRINGE | INTRAMUSCULAR | Status: DC | PRN
Start: 2021-07-17 — End: 2021-07-18

## 2021-07-17 MED ORDER — MIDAZOLAM HCL (PF) 2 MG/2ML IJ SOLN
0.5000 mg | INTRAMUSCULAR | Status: DC | PRN
Start: 2021-07-17 — End: 2021-07-17
  Administered 2021-07-17 (×3): 1 mg via INTRAVENOUS
  Administered 2021-07-17 (×2): .5 mg via INTRAVENOUS
  Administered 2021-07-17: 1 mg via INTRAVENOUS
  Administered 2021-07-17: .5 mg via INTRAVENOUS
  Administered 2021-07-17: 1 mg via INTRAVENOUS
  Administered 2021-07-17 (×5): .5 mg via INTRAVENOUS

## 2021-07-17 MED ORDER — PROTAMINE SULFATE 10 MG/ML IV SOLN
INTRAVENOUS | Status: AC
Start: 2021-07-17 — End: 2021-07-17
  Filled 2021-07-17: qty 5

## 2021-07-17 MED ORDER — FENTANYL CITRATE (PF) 100 MCG/2ML IJ SOLN
INTRAMUSCULAR | Status: AC
Start: 2021-07-17 — End: 2021-07-17
  Filled 2021-07-17: qty 2

## 2021-07-17 MED ORDER — MIDAZOLAM HCL (PF) 2 MG/2ML IJ SOLN
INTRAMUSCULAR | Status: AC
Start: 2021-07-17 — End: 2021-07-17
  Filled 2021-07-17: qty 2

## 2021-07-17 MED ORDER — SODIUM CHLORIDE 0.9 % IV SOLN
1.5000 mL/kg/h | INTRAVENOUS | Status: DC
Start: 2021-07-17 — End: 2021-07-17

## 2021-07-17 MED ORDER — ONDANSETRON HCL 4 MG OR TABS
4.0000 mg | ORAL_TABLET | Freq: Three times a day (TID) | ORAL | Status: DC | PRN
Start: 2021-07-17 — End: 2021-07-18

## 2021-07-17 MED ORDER — POLYETHYLENE GLYCOL 3350 17 G OR PACK
17.0000 g | PACK | Freq: Every day | ORAL | Status: DC | PRN
Start: 2021-07-17 — End: 2021-07-18

## 2021-07-17 MED ORDER — APIXABAN 5 MG OR TABS
5.0000 mg | ORAL_TABLET | Freq: Two times a day (BID) | ORAL | Status: DC
Start: 2021-07-18 — End: 2021-07-18

## 2021-07-17 MED ORDER — SODIUM CHLORIDE 0.9 % IV SOLN
INTRAVENOUS | Status: AC
Start: 2021-07-17 — End: 2021-07-17
  Filled 2021-07-17: qty 50

## 2021-07-17 MED ORDER — ONDANSETRON HCL 4 MG/2ML IJ SOLN
INTRAMUSCULAR | Status: AC
Start: 2021-07-17 — End: 2021-07-17
  Filled 2021-07-17: qty 2

## 2021-07-17 MED ORDER — ACETAMINOPHEN 325 MG OR TABS
325.0000 mg | ORAL_TABLET | ORAL | Status: DC | PRN
Start: 2021-07-17 — End: 2021-07-18
  Administered 2021-07-18: 325 mg via ORAL
  Filled 2021-07-17: qty 1

## 2021-07-17 MED ORDER — CLOPIDOGREL BISULFATE 75 MG OR TABS
ORAL_TABLET | ORAL | Status: AC
Start: 2021-07-17 — End: 2021-07-17
  Filled 2021-07-17: qty 8

## 2021-07-17 MED ORDER — VERAPAMIL HCL 2.5 MG/ML IV SOLN
INTRAVENOUS | Status: DC | PRN
Start: 2021-07-17 — End: 2021-07-17
  Administered 2021-07-17: 2.5 mg via INTRA_ARTERIAL

## 2021-07-17 MED ORDER — NITROGLYCERIN 200 MCG/ML 10 ML SYRINGE IN D5W (PROCEDURAL)
INTRAVENOUS | Status: AC
Start: 2021-07-17 — End: 2021-07-17
  Filled 2021-07-17: qty 10

## 2021-07-17 MED ORDER — PROCHLORPERAZINE EDISYLATE 10 MG/2ML IJ SOLN
10.0000 mg | INTRAMUSCULAR | Status: DC | PRN
Start: 2021-07-17 — End: 2021-07-18

## 2021-07-17 MED ORDER — BUPIVACAINE HCL (PF) 0.25 % IJ SOLN
INTRAMUSCULAR | Status: AC
Start: 2021-07-17 — End: 2021-07-17
  Filled 2021-07-17: qty 10

## 2021-07-17 MED ORDER — BUPIVACAINE HCL (PF) 0.25 % IJ SOLN
INTRAMUSCULAR | Status: DC | PRN
Start: 2021-07-17 — End: 2021-07-17
  Administered 2021-07-17: 10 mL via SUBCUTANEOUS

## 2021-07-17 MED ORDER — SENNOSIDES 8.6 MG OR TABS
17.2000 mg | ORAL_TABLET | Freq: Two times a day (BID) | ORAL | Status: DC | PRN
Start: 2021-07-17 — End: 2021-07-18

## 2021-07-17 MED ORDER — IOHEXOL 350 MG/ML IV SOLN
INTRAVENOUS | Status: DC | PRN
Start: 2021-07-17 — End: 2021-07-17
  Administered 2021-07-17: 170 mL via INTRA_ARTERIAL

## 2021-07-17 MED ORDER — LISINOPRIL 5 MG OR TABS
5.0000 mg | ORAL_TABLET | Freq: Every evening | ORAL | Status: DC
Start: 2021-07-18 — End: 2021-07-18

## 2021-07-17 MED ORDER — ASPIRIN 325 MG OR TABS
325.0000 mg | ORAL_TABLET | Freq: Every day | ORAL | Status: DC
Start: 2021-07-17 — End: 2021-07-17
  Administered 2021-07-17: 325 mg via ORAL

## 2021-07-17 MED ORDER — HYDRALAZINE HCL 20 MG/ML IJ SOLN
5.0000 mg | Freq: Four times a day (QID) | INTRAMUSCULAR | Status: DC | PRN
Start: 2021-07-17 — End: 2021-07-18

## 2021-07-17 MED ORDER — SODIUM CHLORIDE 0.9% IV BOLUS
250.0000 mL | Freq: Once | INTRAVENOUS | Status: DC | PRN
Start: 2021-07-17 — End: 2021-07-18

## 2021-07-17 MED ORDER — ONDANSETRON HCL 4 MG/2ML IJ SOLN
4.0000 mg | Freq: Three times a day (TID) | INTRAMUSCULAR | Status: DC | PRN
Start: 2021-07-17 — End: 2021-07-18
  Administered 2021-07-17: 4 mg via INTRAVENOUS

## 2021-07-17 MED ORDER — NITROGLYCERIN 0.4 MG SL SUBL
0.4000 mg | SUBLINGUAL_TABLET | SUBLINGUAL | Status: DC | PRN
Start: 2021-07-17 — End: 2021-07-17

## 2021-07-17 MED ORDER — FLUMAZENIL 0.5 MG/5ML IV SOLN
0.2000 mg | INTRAVENOUS | Status: DC | PRN
Start: 2021-07-17 — End: 2021-07-17

## 2021-07-17 MED ORDER — CLOPIDOGREL BISULFATE 75 MG OR TABS
75.0000 mg | ORAL_TABLET | Freq: Every day | ORAL | Status: DC
Start: 2021-07-18 — End: 2021-07-18
  Administered 2021-07-18: 75 mg via ORAL
  Filled 2021-07-17: qty 1

## 2021-07-17 MED ORDER — METOCLOPRAMIDE HCL 5 MG/ML IJ SOLN
10.0000 mg | Freq: Four times a day (QID) | INTRAMUSCULAR | Status: DC | PRN
Start: 2021-07-17 — End: 2021-07-18

## 2021-07-17 MED ORDER — METOPROLOL SUCCINATE ER 50 MG OR TB24
50.0000 mg | EXTENDED_RELEASE_TABLET | Freq: Every day | ORAL | Status: DC
Start: 2021-07-17 — End: 2021-07-18
  Administered 2021-07-17 – 2021-07-18 (×2): 50 mg via ORAL
  Filled 2021-07-17 (×2): qty 1

## 2021-07-17 MED ORDER — NALOXONE HCL 0.4 MG/ML IJ SOLN
0.0400 mg | INTRAMUSCULAR | Status: DC | PRN
Start: 2021-07-17 — End: 2021-07-17

## 2021-07-17 MED ORDER — HEPARIN SODIUM (PORCINE) 1000 UNIT/ML IJ SOLN
INTRAMUSCULAR | Status: AC
Start: 2021-07-17 — End: 2021-07-17
  Filled 2021-07-17: qty 20

## 2021-07-17 MED ORDER — CLOPIDOGREL BISULFATE 75 MG OR TABS
600.0000 mg | ORAL_TABLET | Freq: Once | ORAL | Status: AC
Start: 2021-07-17 — End: 2021-07-17
  Administered 2021-07-17: 600 mg via ORAL

## 2021-07-17 MED ORDER — FENTANYL CITRATE (PF) 100 MCG/2ML IJ SOLN
25.0000 ug | INTRAMUSCULAR | Status: DC | PRN
Start: 2021-07-17 — End: 2021-07-17
  Administered 2021-07-17: 25 ug via INTRAVENOUS
  Administered 2021-07-17: 50 ug via INTRAVENOUS
  Administered 2021-07-17 (×11): 25 ug via INTRAVENOUS
  Administered 2021-07-17: 50 ug via INTRAVENOUS

## 2021-07-17 MED ORDER — AMLODIPINE BESYLATE 10 MG OR TABS
10.0000 mg | ORAL_TABLET | Freq: Every day | ORAL | Status: DC
Start: 2021-07-18 — End: 2021-07-18
  Administered 2021-07-18: 10 mg via ORAL
  Filled 2021-07-17: qty 1

## 2021-07-17 MED ORDER — ISOSORBIDE MONONITRATE ER 60 MG OR TB24
60.0000 mg | EXTENDED_RELEASE_TABLET | Freq: Every morning | ORAL | Status: DC
Start: 2021-07-18 — End: 2021-07-18
  Administered 2021-07-18: 60 mg via ORAL
  Filled 2021-07-17: qty 1

## 2021-07-17 MED ORDER — IOHEXOL 300 MG/ML IJ SOLN
INTRAMUSCULAR | Status: AC
Start: 2021-07-17 — End: 2021-07-17
  Filled 2021-07-17: qty 50

## 2021-07-17 MED ORDER — ASPIRIN 325 MG OR TABS
ORAL_TABLET | ORAL | Status: AC
Start: 2021-07-17 — End: 2021-07-17
  Filled 2021-07-17: qty 1

## 2021-07-17 MED ORDER — BUPIVACAINE HCL (PF) 0.25 % IJ SOLN
INTRAMUSCULAR | Status: DC | PRN
Start: 2021-07-17 — End: 2021-07-17
  Administered 2021-07-17: 3 mL via SUBCUTANEOUS

## 2021-07-17 MED ORDER — LIDOCAINE HCL (PF) 1 % IJ SOLN
INTRAMUSCULAR | Status: DC | PRN
Start: 2021-07-17 — End: 2021-07-17

## 2021-07-17 MED ORDER — PROTAMINE SULFATE 10 MG/ML IV SOLN
INTRAVENOUS | Status: DC | PRN
Start: 2021-07-17 — End: 2021-07-17
  Administered 2021-07-17: 25 mg via INTRAVENOUS

## 2021-07-17 MED ORDER — IOHEXOL 350 MG/ML IV SOLN
INTRAVENOUS | Status: AC
Start: 2021-07-17 — End: 2021-07-17
  Filled 2021-07-17: qty 200

## 2021-07-17 SURGICAL SUPPLY — 75 items
BALLOON APEX 2.5MM 40MM 142CM (Balloon) ×6
BALLOON APEX 2.5MM 40MM 142CM MFR DISCONTINUED (Balloon) ×4 IMPLANT
BALLOON APEX 3MM 20MM 142CM (Balloon) ×3
BALLOON APEX 3MM 20MM 142CM MFR DISCONTINUED (Balloon) ×2 IMPLANT
BALLOON NC EUPHORA 3MM 8MM 142CM RX (Balloon) ×3 IMPLANT
BALLOON NC QUANTUM APEX 3.5MM 30MM 143CM (Balloon) ×3
BALLOON NC QUANTUM APEX 3.5MM 30MM 143CM MFR DISCONTINUED (Balloon) ×2 IMPLANT
BALLOON NC QUANTUM APEX 3MM 30MM 143CM (Balloon) ×3
BALLOON NC QUANTUM APEX 3MM 30MM 143CM MFR DISCONTINUED (Balloon) ×2 IMPLANT
CATHETER ANGIO DIAGNOSTIC OPTITORQUE PIGTAIL 5F 110CM 155 ANG (Catheter) ×3 IMPLANT
CATHETER CORSAIR PRO XS 150CM (Catheter) ×3 IMPLANT
CATHETER EXPO 5FR 100CM IMT (Catheter) ×3
CATHETER EXPO 5FR 100CM IMT MFR DISCONTINUED (Catheter) ×2 IMPLANT
CATHETER EXPO 6FR 100CM AL1 (Catheter) ×3
CATHETER EXPO 6FR 100CM AL1 MFR DISCONTINUED (Catheter) ×2 IMPLANT
CATHETER EXPO 6FR 100CM FL3.5 (Catheter) ×3
CATHETER EXPO 6FR 100CM FL3.5 MFR DISCONTINUED (Catheter) ×2 IMPLANT
CATHETER EXPO 6FR 100CM FL4 (Catheter) ×3
CATHETER EXPO 6FR 100CM FL4 MFR DISCONTINUED (Catheter) ×2 IMPLANT
CATHETER EXPO 6FR 100CM FR4 (Catheter) ×3
CATHETER EXPO 6FR 100CM FR4 MFR DISCONTINUED (Catheter) ×2 IMPLANT
CATHETER EXPO 6FR 100CM LCB (Catheter) ×3
CATHETER EXPO 6FR 100CM LCB MFR DISCONTINUED (Catheter) ×2 IMPLANT
CATHETER EXPO 6FR 100CM MPA1 (Catheter) ×3
CATHETER EXPO 6FR 100CM MPA1 MFR DISCONTINUED (Catheter) ×2 IMPLANT
CATHETER FIX TRAPPER 2FR EXCHANGE (Balloon) ×3 IMPLANT
CATHETER IVUS OPTICROSS HD 5FR (Imaging Device) ×3 IMPLANT
CATHETER SASUKE DUAL ACCESS .014IN X 145CM (Catheter) ×3 IMPLANT
DEVICE CLOSURE PERCLOSE PROSTYLE (Closure Device) ×6 IMPLANT
DEVICE COMPRESS TR BAND REG (Other) ×3 IMPLANT
DEVICE ULTRASOUND GALAXY IVUS SLED (Imaging Device) ×3 IMPLANT
DRAPE BRACHIAL/RADIAL (Drape) ×3 IMPLANT
GUIDE CATHETER CONVEY 8F 100CM AL 1 SH (Cardiac Catheter / Guide) ×3 IMPLANT
GUIDE CATHETER LAUNCHER 6FR 90CM MP1 (Cardiac Catheter / Guide) ×3 IMPLANT
GUIDE CATHETER LAUNCHER 7FR 100CM AL.75 SH (Cardiac Catheter / Guide) ×3 IMPLANT
GUIDE CATHETER LAUNCHER 7FR 3DRIGHT 100CM (Catheter) ×3 IMPLANT
GUIDE CATHETER LAUNCHER 8FR 100CM AL.75 SH (Cardiac Catheter / Guide) ×3 IMPLANT
GUIDE CATHETER LAUNCHER 8FR 100CM AL1 SH (Cardiac Catheter / Guide) ×3 IMPLANT
GUIDE CATHETER LAUNCHER 8FR 100CM AR1 SH (Cardiac Catheter / Guide) ×3 IMPLANT
GUIDE CATHETER LAUNCHER 8FR 100CM MP1 (Cardiac Catheter / Guide) ×3 IMPLANT
GUIDEWIRE ADX .035 260CM 3MM J-TIP FIXED-CORE (Guidewire) ×3 IMPLANT
GUIDEWIRE ADX .035 X 150CM 3MM J TIP FIXED CORE (Guidewire) ×3 IMPLANT
GUIDEWIRE AMPLATZ SS .035 X 180CM 3MM J TIP (Guidewire) ×3 IMPLANT
GUIDEWIRE ASAHI ASTATO XS 20 .014IN 180CM STRT (Interventional Guidewire) ×3 IMPLANT
GUIDEWIRE ASAHI GLADIUS .014IN 200CM STRT (Interventional Guidewire) ×6 IMPLANT
GUIDEWIRE ASAHI GLADIUS MONGO .014IN 190CM (Interventional Guidewire) ×3 IMPLANT
GUIDEWIRE ASAHI SION 190CM STRAIGHT (Interventional Guidewire) ×9 IMPLANT
GUIDEWIRE ASAHI SION BLACK .014IN 190CM (Interventional Guidewire) ×3 IMPLANT
GUIDEWIRE ASHAI SION BLUE 190CM STRAIGHT (Interventional Guidewire) ×3 IMPLANT
GUIDEWIRE FIELDER XT-R .014IN 190CM STRT (Interventional Guidewire) ×6 IMPLANT
GUIDEWIRE GLIDEWIRE ADV .035IN 180CM ANGLE 25CM (Guidewire) ×3 IMPLANT
GUIDEWIRE HI-TORQUE VERSACORE MODIFIED J .035IN X 145CM (Guidewire) ×3 IMPLANT
GUIDEWIRE RUNTHROUGH NS .014 X 180CM (Interventional Guidewire) ×12 IMPLANT
GUIDEWIRE SENTAI HORNET 14 .008IN 190CM (Interventional Guidewire) ×3 IMPLANT
GUIDEWIRE UE PILOT 200 .014IN 190CM STRT (Interventional Guidewire) ×12 IMPLANT
INTRODUCER KIT GLIDESHEATH SLENDER 7FR 10CM SHEATH (Sheath) ×3 IMPLANT
INTRODUCER KIT GLIDESHEATH SLENDER S/S 6FR 10CM SHEATH (Sheath) ×3 IMPLANT
KIT DRAPE RIGHT TABLE VERSION (Drape) ×3 IMPLANT
KIT MANIFOLD MTS LHK UW MEDICAL CTR (Other) ×1
KIT MANIFOLD MTS LHK ~~LOC~~ MEDICAL CTR (Other) ×2 IMPLANT
KIT MICRO INTRODUCER 5FR 10CM X 21G NEEDLE (Sheath) ×6 IMPLANT
MICROCATHETER CORSAIR PRO 135CM 2.6-2.8FR SPIRAL PROTECT (Catheter) ×3 IMPLANT
MICROCATHETER GUIDE 150CM .85-.62MM .48MM .55MM (Catheter) ×3 IMPLANT
PACK CUST LEFT HEART ANGIO (Pack) ×3 IMPLANT
SHEATH INTRODUCER PINNACLE  6F X 10CM .038 W/GUIDEWIRE (Sheath) ×3 IMPLANT
SHEATH INTRODUCER PINNACLE  8F X 10CM .038 W/GUIDEWIRE (Sheath) ×6 IMPLANT
SHEATH INTRODUCER PINNACLE  9F X 10CM .038 W/GUIDEWIRE (Sheath) ×3 IMPLANT
SHEATH SUPER ARROW-FLEX 8FR 17 3/4IN (Sheath) ×6 IMPLANT
STENT DES SYNERGY XD MR US 3.5 X 48MM (Drug Eluting Stent) ×3 IMPLANT
SYRINGE MEDALLION 3ML FIX MALE LUER CONNECT WHITE (Syringe) ×6 IMPLANT
SYSTEM PANNUS RETENTION W/ SPADS-1 STRAP (Other) ×3 IMPLANT
VALVE HEMOSTASIS PHD 7.3FR INSERT TOOL (Other) ×3 IMPLANT
VALVE HEMOSTASIS WATCHDOG KIT (Other) ×6 IMPLANT
VALVE HEMOSTAT 8FR WATCHDOG (Other) ×3 IMPLANT
WIRE SNARE STD 18-30MM 7F ENSNARE (Snare) ×3 IMPLANT

## 2021-07-17 NOTE — Brief Op Note (Signed)
Immediate Brief Operative Note   Wendy [name] - DOB: 1946/01/27 (76 year old female) MRN: [id number]  Procedure Date: 07/17/2021      [facility]         PROCEDURE DETAILS    Procedure Team:  Primary: Serena Colonel, MD  Fellow - Assisting: Vernie Ammons Suzanna Obey, MD; Adonis Housekeeper, MD     Procedure(s):   PCI/CTO   Pre Procedure Diagnosis:  CAD (coronary artery disease) [I25.10]     Post Procedure Diagnosis:        * CAD (coronary artery disease) [I25.10]       PROCEDURE SUMMARY     Procedural Description: Graft angiography, RCA CTO PCI, SVG-PDA PCI    Description of Findings: Occluded SVG-diagonal, occluded SVG-ramus-diagonal, heavily diseased SVG-PDA, RCA CTO, patent LIMA-LAD, LAD CTO, anomalous LCx. Unsuccessful RCA CTO PCI attempt. PCI of SVG-PDA performed. Uncomplicated. 8Fr bifemoral access with failed perclose on LFA and s/p successful perclose on RFA. 7Fr L radial access.     Estimated Blood Loss: < 100 cc    Specimens Removed: None    Post-operative Diagnosis: Obstructive CAD
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
## 2021-07-17 NOTE — Discharge Instructions (Addendum)
THINGS TO REMEMBER:  You had successful heart procedure on 07/17/21 with Dr Marisa Sprinkles, involving stent placement to vein graft (1 stent)    Appointments:  Please contact your primary cardiologist's office to coordinate a visit within one month of your hospital discharge.   You will need to return in 4 weeks for a staged PCI procedure. [facility] will call to schedule this for you.     Medications:  You will start back on Plavix 75mg  daily. We recommend Plavix for at least 12 months after your procedure) given your new stents.  We did not change any of other medications while you were here. Please continue to take them as previously prescribed, you can restart Eliquis on 2/7 evening.    Wound care  You may shower 24hr post-procedure. No scrubbing at site; let soapy water run over incision and pat dry with clean towel. Avoid soaking in pool or tub for 14 days.  Do not leave a wet dressing on because that will put you at a higher risk of infection.   Keep the area clean and dry. You do not need to re-cover it. Do not apply any type of creams and/or ointments to the site.   Contact your cardiologist if you develop painful swelling, firmness, or bleeding at procedure incision sites.    Activity  No lifting, pushing, pulling anything more than 10 lbs for 7 days if we accessed your groin.  No lifting, pushing, pulling anything more than 5 lbs for 7 days if we accessed your wrist.  Avoid straining with bowel movements for the next week.   Gradually resume your normal activities over the next 7 days. Gentle walking is ok.   If you should cough, laugh, sneeze and or strain in anyway place the flats of your 3 middle fingers over the site and apply moderate pressure.
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
## 2021-07-17 NOTE — Procedure Nursing Note (Signed)
ICRU RN Pre Procedure Summary Note    Planned Procedure:  PCI/CTO    Planned disposition:  Home vs 4SE overnight stay      Pt prepped per orders and standards of care in ICRU.  See pre procedure documentation for details of pt's prep.    Pre procedure education provided verbally with pt and their family member.  All questions answered.    Nursing Narrative: Pt is in ICRU for PCI procedure today; VSS; pt c/o some R flank pain 3/10 (which is very chronic per pt's son who is her caregiver at home); possible related to stress and the flat position: pt has hx of back pain ongoing. Pt took ASA and Plavix loading doses today. Verbalized understanding of pre procedure instructions/teaching; son at bedside--supportive.

## 2021-07-17 NOTE — Nursing Note (Signed)
ICRU RN Post Procedure Summary Note    Procedure Performed:  PCI    Patient Disposition: [place] with: na    Report Given to:  [place] RN     Discharge / Transfer Criteria:    x  VSS/within 20% of baseline  x  Pt meets goal for pain relief  x  Pt meets goal for nausea relief  x  Return to baseline respiratory status/minimal FiO2 requirements if transferred  x  Return to baseline neuro status  x  Tolerated Liquids  na  Tolerated Solids  na  Voided Post Procedure  na  Ambulated in halls without hematoma, bleeding, or other symptoms  na  If discharging post PCI:    na  Pt already on, or prescribed, DAP medication therapy.  na  Cardiac Rehab referral prescribed    Procedural Access Site Assessment:    Type / Location of Procedural Access Site: L radial, RFA, LFA  + Pulses Distal to Procedural Access Site at Time of Departure from ICRU: R radial and R ulnar per doppler. RDP, RPT, LDP, LPT per doppler  Final Evaluation of Procedural Access Site: R radial site softening hematoma extending to elbow, R and L femoral site clean, dry, intact. Soft.       Nursing Narrative: R radial site hematoma elevated above heart, ice pack placed. Softening compared to admission in ICRU post procedure. Applied pressure at 150 mm hg x10 minutes x3. Dr. Alfonse Spruce came to evaluate at 1800. R and L femoral site clean, dry, intact. Soft. Pedal pulses and radial pulses per doppler.

## 2021-07-18 ENCOUNTER — Other Ambulatory Visit (HOSPITAL_BASED_OUTPATIENT_CLINIC_OR_DEPARTMENT_OTHER): Payer: Self-pay

## 2021-07-18 ENCOUNTER — Other Ambulatory Visit (HOSPITAL_BASED_OUTPATIENT_CLINIC_OR_DEPARTMENT_OTHER): Payer: Self-pay | Admitting: Student in an Organized Health Care Education/Training Program

## 2021-07-18 DIAGNOSIS — I2510 Atherosclerotic heart disease of native coronary artery without angina pectoris: Secondary | ICD-10-CM

## 2021-07-18 DIAGNOSIS — R9431 Abnormal electrocardiogram [ECG] [EKG]: Secondary | ICD-10-CM

## 2021-07-18 DIAGNOSIS — I440 Atrioventricular block, first degree: Secondary | ICD-10-CM

## 2021-07-18 LAB — CBC (HEMOGRAM)
Hematocrit: 36 % (ref 36.0–45.0)
Hemoglobin: 11.4 g/dL — ABNORMAL LOW (ref 11.5–15.5)
MCH: 30.8 pg (ref 27.3–33.6)
MCHC: 32.1 g/dL — ABNORMAL LOW (ref 32.2–36.5)
MCV: 96 fL (ref 81–98)
Platelet Count: 175 10*3/uL (ref 150–400)
RBC: 3.7 10*6/uL — ABNORMAL LOW (ref 3.80–5.00)
RDW-CV: 13.7 % (ref 11.0–14.5)
WBC: 7.23 10*3/uL (ref 4.3–10.0)

## 2021-07-18 LAB — BASIC METABOLIC PANEL
Anion Gap: 5 (ref 4–12)
Calcium: 8.6 mg/dL — ABNORMAL LOW (ref 8.9–10.2)
Carbon Dioxide, Total: 29 meq/L (ref 22–32)
Chloride: 105 meq/L (ref 98–108)
Creatinine: 1.05 mg/dL — ABNORMAL HIGH (ref 0.38–1.02)
Glucose: 166 mg/dL — ABNORMAL HIGH (ref 62–125)
Potassium: 3.9 meq/L (ref 3.6–5.2)
Sodium: 139 meq/L (ref 135–145)
Urea Nitrogen: 18 mg/dL (ref 8–21)
eGFR by CKD-EPI 2021: 56 mL/min/{1.73_m2} — ABNORMAL LOW (ref >59)

## 2021-07-18 LAB — COVID-19 CORONAVIRUS QUALITATIVE PCR: COVID-19 Coronavirus Qual PCR Result: NOT DETECTED

## 2021-07-18 LAB — EKG 12 LEAD
Atrial Rate: 65 {beats}/min
P Axis: 57 degrees
P-R Interval: 226 ms
Q-T Interval: 396 ms
QRS Duration: 90 ms
QTC Calculation: 411 ms
R Axis: 49 degrees
T Axis: 137 degrees
Ventricular Rate: 65 {beats}/min

## 2021-07-18 MED ORDER — CLOPIDOGREL BISULFATE 75 MG OR TABS
75.0000 mg | ORAL_TABLET | Freq: Every day | ORAL | 11 refills | Status: DC
Start: 2021-07-18 — End: 2022-05-31

## 2021-07-18 NOTE — Nursing Note (Deleted)
Illness Severity  stable      Patient Summary  Wendy [name] with CAD s/p CABG 2006, AS, pAF on apixaban, HTN, HL, prior CVA 06/2020, non-functioning right kidney, bipolar disorder who had PCI with Dr. [name] 2/6, SVG-PDA stent x1, Unsuccessful RCA CTO PCI attempt.  0700-1100 am   - A&Ox4. VSS on RA, afebrile, Occasionally hypertensive  - Tele: NSR: episode of bradycardia; Sinus brady to Junctional rhythm 37-40 bpm, provider aware  - L radial site covered gauze and transparent dressing. Hematoma post procedure, pressure and ice, now soft   - bil groin sites dressing c/d/I, no bleeding or hematoma. Mild pain, ice-packs and scheduled Tylenol one tab given, help with the chronic back pain.   - No nausea  - OOB twice, SBA , voided to bathroom   - Patient was feeling dizzy this morning, vitals taken, notified provider, encouraged fluid and eating, patient felt better after drinking water and eating, denied feeling dizzy afterwards.  Patient stated she was having spots in her eyes since yesterday and that she had an occular migraine with prisms, but didn't let night shift nurse know. Notified MD, MD came to bedside to assess, no new orders, not of concern. Patient   stating the spots are getting better and she has not had a migraine since I have been here.          Action Items  - Assess left radial site and arm       Situational Awareness  -

## 2021-07-18 NOTE — Nursing Note (Signed)
Illness Severity  Stable      Patient Summary  76 year old female with CAD s/p CABG 2006 (LIMA-lAD, SVG-RCA, SVG-RI-D1 [occluded], SVG-D3), mild-to-moderate AS, pAF on apixaban, HTN, HL, prior CVA 06/2020, non-functioning right kidney, bipolar disorder with recent cardiac SPECT   MRI showing large area of severe ischemia now s/p SVG-PDA PCI with DESx1 [name] 07/17/21).    - VSS on RA. A&O x4.  - Tele: NSR/SB  - Bilateral groin sites CDI. CMS intact. Soft, no hematoma.  - L radial site CDI with gauze/tegaderm. Hematoma post-procedure. Now soft with swelling and bruising present.  - D/C teaching completed. D/C Rx sent to [facility] in [place].  - Pt. taken down to hospital entrance in wheelchair to wait for ride home.

## 2021-07-18 NOTE — Nursing Note (Signed)
Illness Severity  stable      Patient Summary  83F with CAD s/p CABG 2006, AS, pAF on apixaban, HTN, HL, prior CVA 06/2020, non-functioning right kidney, bipolar disorder who had PCI with Dr. [name] 2/6, SVG-PDA stent x1, Unsuccessful RCA CTO PCI attempt.     - VSS on RA, afebrile, Occasionally hypertensive  - Tele: NSR: episode of bradycardia; Sinus brady to Junctional rhythm 37-40 bpm, provider aware  - L radial site covered gauze and transparent dressing. Hematoma post procedure, pressure and ice, now soft   - bil groin sites dressing c/d/I, no bleeding or hematoma. Mild pain, ice-packs and scheduled Tylenol  - Chronic back pain, relieved by heat packs   - One episode of n/v with movement and food.  - OOB twice, SBA , voided to bathroom   -         Action Items  - Assess left radial site and arm       Situational Awareness  -

## 2021-07-18 NOTE — Nursing Note (Addendum)
Illness Severity  stable      Patient Summary  51F with CAD s/p CABG 2006, AS, pAF on apixaban, HTN, HL, prior CVA 06/2020, non-functioning right kidney, bipolar disorder who had PCI with Dr. [name] 2/6, SVG-PDA stent x1, Unsuccessful RCA CTO PCI attempt.  0700-1100 am   - A&Ox4. VSS on RA, afebrile, Occasionally hypertensive.   - Tele: NSR: episode of bradycardia; Sinus brady to Junctional rhythm 37-40 bpm, provider aware. Patient had one PAC this later morning, checked on patient, she said she was fine, then she said oh yeah I felt something in my chest   then it went it away, I am okay. Notified provider. No new orders.  - L radial site covered gauze and transparent dressing. Hematoma post procedure, pressure and ice, now soft   - bil groin sites dressing c/d/I, no bleeding or hematoma. Mild pain, ice-packs and scheduled Tylenol one tab given, help with the chronic back pain.   - No nausea  - OOB twice, SBA , voiding to bathroom   - Patient was feeling dizzy this morning, vitals taken, notified provider, encouraged fluid and eating, patient felt better after drinking water and eating, denied feeling dizzy afterwards.  Patient stated she was having spots in her eyes since yesterday and that she had an occular migraine with prisms, but didn't let night shift nurse know. Notified MD, MD came to bedside to assess, no new orders, not of concern. Patient   stating the spots are getting better and she has not had a migraine since I have been here.  -Patient expressing, feeling tearful, out of no where, she told this RN I have experienced depression before, I am not sure what this, I am just concerned about this. Patient denies suicidal thought. Saying, I just feel tearful out   of no where, I don't know why it is happening or what brings it on. Notified team asking if they could come to bedside to see patient to talk about this.   Resting comfortably other wise, tylenol helped for her back pain. No nausea. Daughter in  law will be he patient's ride this afternoon.        Action Items  - Assess left radial site and arm       Situational Awareness  -

## 2021-07-19 LAB — EKG 12 LEAD
Atrial Rate: 59 {beats}/min
P Axis: 66 degrees
P-R Interval: 200 ms
Q-T Interval: 438 ms
QRS Duration: 94 ms
QTC Calculation: 433 ms
R Axis: 61 degrees
T Axis: 131 degrees
Ventricular Rate: 59 {beats}/min

## 2021-07-20 ENCOUNTER — Encounter (HOSPITAL_BASED_OUTPATIENT_CLINIC_OR_DEPARTMENT_OTHER): Payer: Self-pay | Admitting: Cardiovascular Disease

## 2021-07-20 ENCOUNTER — Telehealth (HOSPITAL_BASED_OUTPATIENT_CLINIC_OR_DEPARTMENT_OTHER): Payer: Self-pay | Admitting: Cardiovascular Disease

## 2021-07-20 ENCOUNTER — Ambulatory Visit: Payer: Medicare HMO | Attending: Cardiovascular Disease | Admitting: Cardiovascular Disease

## 2021-07-20 DIAGNOSIS — I2510 Atherosclerotic heart disease of native coronary artery without angina pectoris: Secondary | ICD-10-CM | POA: Insufficient documentation

## 2021-07-20 DIAGNOSIS — Z9861 Coronary angioplasty status: Secondary | ICD-10-CM | POA: Insufficient documentation

## 2021-07-20 NOTE — Telephone Encounter (Signed)
Called and spoke to pt. She recently had PCI procedure done on 2/6 and currently has significant bruising on the operated L arm and feels slightly off. She does not feel stable enough to commute to clinic thus we converted her appt w/ Dr. Bridgett Larsson to telemed. Informed pt Zoom link will be on MyChart and asked if she wanted a copy via text. Pt stated yes - Zoom link sent via [employer] to her cell.

## 2021-07-20 NOTE — Progress Notes (Signed)
Cardiology Follow-up    CC: Follow-up CAD/PCI.    Interval/HISTORY OF PRESENT ILLNESS:  Since I last saw her in 10/2020, Wendy [name] had severe dyspnea/fatigue, and a markedly abn MPS and then Cath at [facility].  PCI was performed (see below) but CTO PCI repeat attempt planned.  She feels somewhat better/noticibly better since.  Feels better when she gets good sleep.  No syncope, orthopnea.    Past History: [name] is a 76 y/o W with h/o CAD, s/p CABG (2006.  LIMA-LAD, SVG-dRCA (with endarterectomy of RPDA and PL), SeqSVG-RI-D1, SVG-D3).  Back in 2006 she had DOE and was worked up for CAD, ultimately had a cath and then CABG.  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  She has a h/o statin intolerance.  More recently she has developed DOE and has been limiting her activity.  As a result her PCP, Dr. Ronalee Red, ordered a DSE.  This showed inducible ischemia and reproduced her symptoms.  This led to PCI of SVG-->D1.  This relieved her sx and allowed her to enjoy her dtrs wedding.  She did CR.  She then suffered a CVA and was dx on Afib and started on Apixaban.  I saw her in 07/2020 after this.      PAST MEDICAL HISTORY/PROBLEM LIST:  Patient Active Problem List    Diagnosis Date Noted    CAD S/P percutaneous coronary angioplasty [I25.10, Z98.61] 07/17/2021    Memory difficulties [R41.3] 04/06/2021     04/06/21 MoCA 27/30, abnormal Maze test  TSH 08/10/20 normal   Vitamin B-12 04/06/21 220      Stage 3a chronic kidney disease ([place]) [N18.31]     Dyspnea on exertion [R06.09] 12/28/2020     Added automatically from request for surgery [id number]      Other chest pain [R07.89] 12/23/2020     Added automatically from request for surgery [id number]      Chronic anticoagulation [Z79.01] 08/18/2020    DISH (diffuse idiopathic skeletal hyperostosis) [M48.10] 07/20/2020    Postmenopausal bleeding [N95.0] 07/15/2020     07/15/20 pelvic U/S  endometrium thickened with cystic lesions thickness 18 mm, multiple fibroids      Paroxysmal atrial fibrillation ([place]) [I48.0] 07/11/2020    Abnormal glucose [R73.09] 06/23/2020     06/2020 HbA1c 5.7%  06/2021 HbA1c 5.8%      History of R MCA cerebrovascular accident (CVA) with residual deficit [I69.30] 06/22/2020     06/22/20 R MCA cardioembolic CVA likely due to paroxysmal atrial fibrillation      Esophageal dysphagia [R13.19] 08/24/2019    Gastroesophageal reflux disease without esophagitis [K21.9] 08/24/2019    Body mass index 40.0-44.9, adult ([place]) [Z68.41] 07/16/2019    Primary osteoarthritis of right knee [M17.11] 07/16/2019    Hx of CABG [Z95.1]      CABG (2006.  LIMA-LAD, SVG-dRCA (with endarterectomy of RPDA and PL), SeqSVG-RI-D1, SVG-D3)   Cath 2021 with lesion in SVG-Diagonal s/p PCI, occluded graft to ramus/diagonal, disease at the bifurcation of RCA graft insertion managed medically at that time as sx improving.       Essential hypertension [I10] 06/08/2019    Asthma [J45.909] 06/08/2019    Bipolar disorder without psychotic features ([place]) [F31.9] 06/08/2019    Coronary artery disease [I25.10] 06/08/2019     Multiple vessel 2006  Abnormal DSE 10/2019  Cath 11/18/19 PCI SVG-diagonal  Clopidogrel through 11/2020      Colon polyps [K63.5] 06/08/2019  Depression [F32.A] 06/08/2019    Generalized anxiety disorder [F41.1] 06/08/2019    Migraine [G43.909] 06/08/2019    Multinodular goiter [E04.2] 06/08/2019     07/15/20 thyroid U/S multiple thyroid nodules, R lobe one nodule TIRADS 4 --> per endocrine repeat U/S 1 year (07/2021)      Vitamin D deficiency [E55.9] 06/08/2019    Mixed hyperlipidemia [E78.2]      Cannot tolerate statins      Hyperparathyroidism ([place]) [E21.3]     Osteoporosis without current pathological fracture [M81.0]      07/16/19 25-OH vitamin D normal; known hyperparathyroidism      Nephrolithiasis [N20.0] 04/27/2019    Non-functioning R kidney [N28.9] 01/22/2019     Due to  ureteral stone      Pulmonary nodule less than 6 cm determined by computed tomography of lung [IMO0001] 01/09/2019     R lung 5 mm nodule incidental finding [facility] [place])      Abnormal computed tomography of pelvis [R93.5] 01/09/2019     Probable fibroid      Diastolic dysfunction [P37.90] 08/17/2017     Formatting of this note might be different from the original.   Normal left ventricular systolic function, ejection fraction 65 to 24%   Diastolic dysfunction - grade II (elevated filling pressures)   Dilated left atrium - mild   Degenerative mitral valve disease   Mitral annular calcification   Aortic stenosis - mild   Aortic regurgitation - moderate   Normal right ventricular systolic function    ECHO 2017      History of R parathyroidectomy (2017) [E89.2] 02/07/2016    Aortic stenosis, mild [I35.0] 01/08/2016     02/25/18 TTE Mild aortic stenosis with mild-moderate aorticregurgitation  06/25/20 TTE mild-moderate AS      Aortic regurgitation [I35.1] 01/08/2016     02/25/18 TTE mild-moderate      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] 12/2015    History of R metatarsal fractures [Z87.81] 2017     2017 R 2nd, 3rd, 4th proximal metatarsals       ALL:  Review of patient's allergies indicates:  Allergies   Allergen Reactions    Hydrochlorothiazide Skin: Itching and Throat Swelling    Lidocaine Other     Hallucinations    Penicillins Skin: Hives     Reaction in [place] (??)  Has patient had a PCN reaction causing immediate rash, facial/tongue/throat swelling, SOB or lightheadedness with hypotension: Yes  Has patient had a PCN reaction causing severe rash involving mucus membranes or skin necrosis: No  Has patient had a PCN reaction that required hospitalization: No  Has patient had a PCN reaction occurring within the last 10 years: No  If all of the above answers are "NO", then may proceed with Cephalosporin use.      Coconut Fatty Acids      Other reaction(s): Other (See  Comments)  Reaction??    Latex Skin: Itching    Losartan Headache and Other     Other reaction(s): Other (See Comments), Unknown  Pt doesn't remember.  High blood pressure    Statins Headache and Other     Other reaction(s): Other (See Comments), Other (See Comments) high blood pressure, ear ache and foggy thinking      MEDS:  Outpatient Medications Prior to Visit   Medication Sig Dispense Refill    acetaminophen 500 MG tablet Take 1 tablet (500 mg) by mouth every 6 hours as needed.  amLODIPine 10 MG tablet Take 1 tablet (10 mg) by mouth daily. 90 tablet 2    apixaban (Eliquis) 5 MG tablet Take 1 tablet (5 mg) by mouth 2 times a day. Blood thinner for stroke prevention. 180 tablet 1    cetirizine 10 MG tablet Take 1 tablet by mouth daily For allergy symptoms 90 tablet 0    clopidogrel 75 MG tablet Take 1 tablet (75 mg) by mouth daily. 30 tablet 11    cyanocobalamin 1000 MCG tablet Take 1 tablet (1,000 mcg) by mouth daily. For vitamin B-12 and memory 90 tablet 3    ipratropium 0.03 % nasal spray Spray 2 sprays into each nostril 2 times a day. For nasal congestion. Replaces fluticasone 30 mL 1    isosorbide mononitrate ER 60 MG 24 hr tablet Take 1 tablet (60 mg) by mouth every morning. 30 tablet 1    lisinopril 5 MG tablet TAKE ONE TABLET BY MOUTH ONE TIME DAILY (Patient taking differently: Take 1 tablet (5 mg) by mouth every evening.) 90 tablet 2    metoprolol succinate ER 50 MG 24 hr tablet Take 1 tablet (50 mg) by mouth daily. (Patient taking differently: Take 1 tablet (50 mg) by mouth daily. Nightly) 90 tablet 3    nitroGLYCERIN 0.4 MG SL tablet Place 1 tablet (0.4 mg) under the tongue every 5 minutes as needed for chest pain for up to 3 doses. If no relief, call 911. 25 tablet 1     No facility-administered medications prior to visit.     PHYSICAL EXAM:  There were no vitals filed for this visit.  Gen: WD, WN woman, NAD  E: Anicteric  R: Relaxed respirations  P: Normal M & A  S: No  rashes    OTHER DATA:    Results for orders placed or performed during the hospital encounter of 95/09/32   Basic Metabolic Panel   Result Value Ref Range    Sodium 139 135 - 145 meq/L    Potassium 3.9 3.6 - 5.2 meq/L    Chloride 105 98 - 108 meq/L    Carbon Dioxide, Total 29 22 - 32 meq/L    Anion Gap 5 4 - 12    Glucose 166 (H) 62 - 125 mg/dL    Urea Nitrogen 18 8 - 21 mg/dL    Creatinine 1.05 (H) 0.38 - 1.02 mg/dL    Calcium 8.6 (L) 8.9 - 10.2 mg/dL    eGFR by CKD-EPI 2021 56 (L) >59 mL/min/1.73_m2     DSE 11/03/2019:  1. Patient developed chest pain at peak stress, described to be similar to  the pain she felt before having her CABG, that resolved during recovery.   2. Baseline ECG shows normal sinus rhythm with nonspecific ST/T-wave changes.  At peak stress, there are inferior (III, aVF) ST elevations with reciprocal  ST depressions in leads I and aVL.   3. Baseline TTE shows normal LV size and systolic function (biplane EF 65%)  with no regional wall motion abnormalities. There is mild mitral annular  calcification, mild-moderate aortic stenosis, and mild-moderate aortic regurgitation.  4. At peak stress, there is hypokinesis to akinesis in the myocardial  segments as diagrammed.   5. In summary, there is ECG and echocardiographic evidence of inducible ischemia.    11/18/19:  Impression:  1. Successful IVUS guided PCI of SVG-diagonal with 3.0(30) mm Resolute Onyx DES.  2. Diagnostic angiography demonstrating severe disease in SVG-diagonal and SVG-rPDA, occluded SVG-RI-diagonal, and patent LIMA-LAD.  3. Successful ultrasound guided 26F RFA access.  4. Perclose to the RFA.    Recommendations:  1. Patient initially loaded with ticagrelor 180 mg intraprocedure. Reloaded with 300 mg Plavix PO prior to discharge this evening. Continue Plavix 75 mg daily for at least 1 year.  2. Continue ASA 81 mg daily indefinitely.  3. Follow up visits with Dr. Bridgett Larsson  in 2 weeks to discuss further interventions (CTO PCI of ramus, CTO  PCI of RCA) and we can rediscuss with her if clinically indicated.    TTE 06/23/20:  1. The left ventricle is normal in size. There is mild concentric increase in the wall thickness of the left ventricle. Global left ventricular function is normal (biplane EF 65%). The left ventricular average longitudinal peak  systolic strain is borderline (-17%). No regional wall motion abnormalities are present.  2. The right ventricle is normal size. The right ventricular systolic function is normal.  3. The aortic valve is mildly calcified. There is mild to moderate valvular aortic stenosis (Vmax 2.9 m/s, MG 20 mmHg, AVA 1.4 cm2). Mild to moderate aortic regurgitation is present.  4. Mild mitral annular calcification is present. The mitral annular calcification is posterior.  5. Agitated saline contrast study was negative at rest, with Valsalva, and with cough.  6. Pulmonary artery systolic pressure could not be estimated due to an insufficient tricuspid regurgitant jet.  7. There is no pericardial effusion.    Compared with the baseline TTE from 11/03/2019, no significant changes are noted.    Cath 07/17/21:  NARRATIVE:  Left radial access was challenging. We were successful at engaging the LMCA (JL 3.5) and LIMA (IMT) and SVG-ramus-diagonal (AL1) via this access site. In order to engage RCA, SVG-diagonal and SVG- PDA, we had to use femoral access.     Left main coronary artery: Moderate caliber vessel which gives rise to a moderate caliber LAD and moderate caliber ramus. 40% ostial stenosis.   LAD: Moderate caliber vessel which gives rise to a 2 moderate caliber diagonal branches.  Notably a vein graft with a skip segment supplies the first diagonal. The second diagonal is supplied by another independent vein graft.    Ramus: Moderate caliber vessel which gives rise to several smaller branches. The vessel fills via the native circulation with a tubular 50% stenosis of the proximal vessel.   LCx: Anomalous vessel from the RCA.  Small to moderate caliber vessel with a 70% ostial stenosis and serial 40% stenoses.   RCA: Mid RCA CTO with epicardial collaterals from the Lcx.   SVG- RCA: Heavily disease with 90% tubular mid vessel disease. 80% disease just distal to the anastomosis.   LIMA-LAD: Widely patent and supplies the mid to distal LAD.  SVG- diagonal: Occluded. Epicardial collaterals are present from apical LAD to diagonal.   SVG-Ramus-Diagonal: Occluded    Occluded SVG  Occluded SVG-Ramus-Diagonal  90% stenosis SVG-PDA  Patent LIMA-LAD  LAD CTO    1. Unsuccessful RCA CTO PCI 2/2 fibrotic graft anastomosis   2. PCI SVG-PDA with 3.5X28m Synergy DES. Post dilated with 3.0NC.     RECOMMENDATIONS:  Admit for overnight monitoring  Continue therapeutic anticoagulation tomorrow evening  Plavix 75 mg daily for at least 12 months, consider long-term for secondary prevention  Aggressive medical management of CAD as per the primary cardiologist  Return for RCA CTO PCI attempt in 1 month  Referral to cardiac rehab    IMPRESSION/RECOMMENDATIONS: 76y/o W with CAD s/p CABG with recurrent anginal equivalent  DOE and abnormal DSE now s/p PCI to SVG-->Diagonal on 11/18/19.    CAD: Improved somewhat.  Repeat attempt in a few weeks.  Keep same meds for now.    HTN: Hasn't been monitoring at home.  Continue current meds.    AS/AR: Both mild-mod on 06/23/20.  Next TTE in 2-3 years.    Lipids.  Statin intolerant.  Lipid clinic visit pending.    Sleep Clinic Eval. Pending.    Follow-up 4-6 mo. Or sooner prn.    Distant Site Telemedicine Encounter      I conducted this encounter via secure, live, face-to-face video conference with the patient. I reviewed the risks and benefits of telemedicine as pertinent to this visit and the patient agreed to proceed.    Provider Location: On-site location (clinic, hospital, on-site office)  Patient Location: At home  Present with patient: No one else present     15 min on Zoom.

## 2021-07-20 NOTE — Telephone Encounter (Addendum)
Tried calling patient  But no answer . Called and spoke with daughter Wendy [name] who is currently in [place]. She states her mom is by herself.   Explained to daughter bruising is expected post PCI . However discussed with her need to know patient's vitals like BP , has she been drinking enough fluids or eating , any new swelling or pain that would indicated new bleeding . Daughter Wendy [name] will try to contact Wendy [name] to find out. This RN will try to call patient again . Recommend if patient is too weak or very dizzy or in pain to have her evaluated at the ED.  Patient has appointment with [facility] provider today .      Background :  76 year old female with a history of HTN, HLD (statin intolerant), solitary kidney, CAD (s/p 5v CABG in 2006     s/p failed RCACTO  07-17-21     1. Unsuccessful RCA CTO PCI 2/2 fibrotic graft anastomosis     2. PCI SVG-PDA with 3.5X45mm Synergy DES. Post dilated with 3.0NC.   Return for RCA CTO PCI attempt in 1 month.    Recommend alternate ice and warm packs on the bruised area.

## 2021-07-20 NOTE — Patient Instructions (Signed)
I hope there is additional success in the cath lab next time w/ Dr. [name]  No changes in medications  After the coronary work is done we need to make sure you see [facility]!

## 2021-07-20 NOTE — Telephone Encounter (Signed)
RETURN CALL: Voicemail - Detailed Message      SUBJECT:  General Message     MESSAGE: Patient daughter called to say the patient is not feeling well today and would like to request todays appointment be changed to telemedicine, she is experienceing dizzyness, bruising , please call

## 2021-07-24 ENCOUNTER — Encounter (HOSPITAL_BASED_OUTPATIENT_CLINIC_OR_DEPARTMENT_OTHER): Payer: Self-pay | Admitting: Internal Medicine

## 2021-07-24 NOTE — Telephone Encounter (Signed)
Left message for the patient to schedule for the next procedure

## 2021-07-25 NOTE — Telephone Encounter (Signed)
Left message for patient : f/u on post PCI arm bruising . Patient to return call if not improving after alternate ice and warm packs .

## 2021-07-26 ENCOUNTER — Encounter (HOSPITAL_BASED_OUTPATIENT_CLINIC_OR_DEPARTMENT_OTHER): Payer: Self-pay | Admitting: Internal Medicine

## 2021-07-26 ENCOUNTER — Ambulatory Visit: Payer: Medicare HMO | Attending: Clinical | Admitting: Clinical

## 2021-07-26 DIAGNOSIS — F4323 Adjustment disorder with mixed anxiety and depressed mood: Secondary | ICD-10-CM | POA: Insufficient documentation
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
## 2021-07-28 ENCOUNTER — Encounter (HOSPITAL_BASED_OUTPATIENT_CLINIC_OR_DEPARTMENT_OTHER): Payer: Self-pay | Admitting: Internal Medicine

## 2021-07-28 DIAGNOSIS — I25709 Atherosclerosis of coronary artery bypass graft(s), unspecified, with unspecified angina pectoris: Secondary | ICD-10-CM

## 2021-07-28 DIAGNOSIS — E782 Mixed hyperlipidemia: Secondary | ICD-10-CM

## 2021-07-28 DIAGNOSIS — R1319 Other dysphagia: Secondary | ICD-10-CM

## 2021-07-28 DIAGNOSIS — K219 Gastro-esophageal reflux disease without esophagitis: Secondary | ICD-10-CM

## 2021-07-28 DIAGNOSIS — R7303 Prediabetes: Secondary | ICD-10-CM

## 2021-07-28 MED ORDER — PANTOPRAZOLE SODIUM 20 MG OR TBEC
40.0000 mg | DELAYED_RELEASE_TABLET | Freq: Every day | ORAL | 1 refills | Status: DC
Start: 2021-07-28 — End: 2021-09-22

## 2021-07-28 NOTE — Telephone Encounter (Signed)
Rx pantoprazole 20 mg daily #30 with 1 refill, will need to re-assess. Pantoprazole ordered due to interaction of omeprazole with clopidogrel.    MyChart reply sent, also for pre-diabetes questions per message from BHIP.    Lab Results   Component Value Date    A1C 5.8 06/22/2021    A1C 5.7 06/23/2020

## 2021-07-29 ENCOUNTER — Encounter (HOSPITAL_BASED_OUTPATIENT_CLINIC_OR_DEPARTMENT_OTHER): Payer: Self-pay | Admitting: Internal Medicine

## 2021-08-01 ENCOUNTER — Encounter (HOSPITAL_BASED_OUTPATIENT_CLINIC_OR_DEPARTMENT_OTHER): Payer: Self-pay | Admitting: Internal Medicine

## 2021-08-09 ENCOUNTER — Ambulatory Visit: Payer: Medicare HMO | Attending: Clinical | Admitting: Clinical

## 2021-08-09 DIAGNOSIS — F419 Anxiety disorder, unspecified: Secondary | ICD-10-CM | POA: Insufficient documentation

## 2021-08-09 NOTE — Progress Notes (Unsigned)
[facility]  Follow up Visit    Distant Site Telemedicine Encounter      I conducted this encounter via secure, live, face-to-face video conference with the patient. I reviewed the risks and benefits of telemedicine as pertinent to this visit and the patient agreed to proceed.    Provider Location: On-site location (clinic, hospital, on-site office)  Patient Location: At home  Present with patient: No one else present       Consult for: Angina  Requested by: Wendy [name]. Wendy Larsson, MD    Identification:   The patient is a 76 year old woman with CAD s/p CABG 2006 (LIMA-lAD, SVG-RCA, SVG-RI-D1 [occluded], SVG-D3), sp SVG-D1 PCI 11/18/2019, mild-to-moderate AS, pAF on apixaban, HTN, HL, prior CVA 06/2020, non-functioning right kidney, bipolar disorder who is referred for recurrent angina.    History of Present Illness:  Last clinic visit 01/2021. At that time, she reported exertional dyspnea with minimal exertion (walking to pick up her mail), which was progressive over the last 6 months. She was referred for nuclear stress test to guide revascularization, given multiple obstructive lesions on prior angiogram. SPECT MPI was performed on 07/06/21, demonstrating large area of severe ischemia in the entire anterior, anterolateral, and inferolateral walls, as well as non-transmural inferior infarct.     On 07/17/21, she underwent attempted RCA CTO PCI, which was unsuccessful due to fibrotic graft anastomosis. SVG-PDA PCI with 3.5x48 Synergy was performed as a temporizing measure, with plan for another RCA CTO PCI attempt in a month. She developed forearm hematoma requiring protamine.     Today, she reports that overall, she feels much better after PCI. Initially, she had left arm pain and evolving bruising, which is now finally improved. She notes that her exertional tolerance is much better - she can walk at least twice as far as prior to PCI, but perhaps more - she hasn't noticed limitations yet in the course of her  daily activities at the senior living facility (prior to PCI could walk 235fet max, limited by severe dyspnea). She feels that her energy is returning.     She is taking her medications without any issues, including apixaban and plavix.     Medical History:   Patient Active Problem List    Diagnosis Date Noted   . Dyspnea on exertion [R06.00] 12/28/2020     Added automatically from request for surgery [id number]    . Other chest pain [R07.89] 12/23/2020     Added automatically from request for surgery [id number]    . Chronic anticoagulation [Z79.01] 08/18/2020   . DISH (diffuse idiopathic skeletal hyperostosis) [M48.10] 07/20/2020   . Postmenopausal bleeding [N95.0] 07/15/2020     07/15/20 pelvic U/S endometrium thickened with cystic lesions thickness 18 mm, multiple fibroids     . Paroxysmal atrial fibrillation ([place] [I48.0] 07/11/2020   . History of R MCA cerebrovascular accident (CVA) with residual deficit [I69.30] 06/22/2020     06/22/20 R MCA cardioembolic CVA likely due to paroxysmal atrial fibrillation     . Esophageal dysphagia [R13.19] 08/24/2019   . Gastroesophageal reflux disease without esophagitis [K21.9] 08/24/2019   . Body mass index 40.0-44.9, adult ([place] [Z68.41] 07/16/2019   . Primary osteoarthritis of right knee [M17.11] 07/16/2019   . Hx of CABG [Z95.1]      CABG (2006.  LIMA-LAD, SVG-dRCA (with endarterectomy of RPDA and PL), SeqSVG-RI-D1, SVG-D3)   Cath 2021 with lesion in SVG-Diagonal s/p PCI, occluded graft to ramus/diagonal, disease at the bifurcation  of RCA graft insertion managed medically at that time as sx improving.      . Essential hypertension [I10] 06/08/2019   . Asthma [J45.909] 06/08/2019   . Bipolar disorder without psychotic features ([place]) [F31.9] 06/08/2019   . Coronary artery disease [I25.10] 06/08/2019     Multiple vessel 2006  Abnormal DSE 10/2019  Cath 11/18/19 PCI SVG-diagonal  Clopidogrel through 11/2020     . Colon polyps [K63.5] 06/08/2019   . Depression [F32.A] 06/08/2019   .  Generalized anxiety disorder [F41.1] 06/08/2019   . Migraine [G43.909] 06/08/2019   . Multinodular goiter [E04.2] 06/08/2019     07/15/20 thyroid U/S multiple thyroid nodules, R lobe one nodule TIRADS 4 --> per endocrine repeat U/S 1 year (07/2021)     . Vitamin D deficiency [E55.9] 06/08/2019   . Mixed hyperlipidemia [E78.2]      Cannot tolerate statins     . Hyperparathyroidism ([place]) [E21.3]    . Osteoporosis without current pathological fracture [M81.0]    . Nephrolithiasis [N20.0] 04/27/2019   . Non-functioning R kidney [N28.9] 01/22/2019     Due to ureteral stone     . Pulmonary nodule less than 6 cm determined by computed tomography of lung [R91.1] 01/09/2019     R lung 5 mm nodule incidental finding [facility] [place])     . Abnormal computed tomography of pelvis [R93.5] 01/09/2019     Probable fibroid     . Diastolic dysfunction [T55.73] 08/17/2017     Formatting of this note might be different from the original.   Normal left ventricular systolic function, ejection fraction 65 to 22%   Diastolic dysfunction - grade II (elevated filling pressures)   Dilated left atrium - mild   Degenerative mitral valve disease   Mitral annular calcification   Aortic stenosis - mild   Aortic regurgitation - moderate   Normal right ventricular systolic function    ECHO 2017     . History of R parathyroidectomy (2017) [E89.2] 02/07/2016   . Aortic stenosis, mild [I35.0] 01/08/2016     02/25/18 TTE Mild aortic stenosis with mild-moderate aorticregurgitation  06/25/20 TTE mild-moderate AS     . Aortic regurgitation [I35.1] 01/08/2016     02/25/18 TTE mild-moderate     . 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 History of R metatarsal fractures [Z87.81] 2017     2017 R 2nd, 3rd, 4th proximal metatarsals       Allergies:  Review of patient's allergies indicates:  Allergies   Allergen Reactions   . Hydrochlorothiazide Skin: Itching and Throat Swelling   . Lidocaine Other     Hallucinations   . Penicillins Skin: Hives      Reaction in [place] (??)  Has patient had a PCN reaction causing immediate rash, facial/tongue/throat swelling, SOB or lightheadedness with hypotension: Yes  Has patient had a PCN reaction causing severe rash involving mucus membranes or skin necrosis: No  Has patient had a PCN reaction that required hospitalization: No  Has patient had a PCN reaction occurring within the last 10 years: No  If all of the above answers are "NO", then may proceed with Cephalosporin use.     . Coconut Fatty Acids      Other reaction(s): Other (See Comments)  Reaction??   . Latex Skin: Itching   . Losartan Headache and Other     Other reaction(s): Other (See Comments), Unknown  Pt doesn't remember.  High blood pressure   .  Statins Headache and Other     Other reaction(s): Other (See Comments), Other (See Comments) high blood pressure, ear ache and foggy thinking      Medications:  Current Outpatient Medications   Medication Instructions   . acetaminophen (TYLENOL) 500 mg, Every 6 hours PRN   . amLODIPine 10 MG tablet take 1 tablet by mouth once daily   . apixaban (ELIQUIS) 5 mg, Oral, 2 times daily, Blood thinner for stroke prevention. Stop aspirin after starting apixaban   . cetirizine (ZYRTEC) 10 mg, Oral, Daily, For allergy symptoms.   . clopidogrel (PLAVIX) 75 mg, Oral, Daily   . isosorbide mononitrate ER (IMDUR) 60 mg, Oral, Every morning   . lisinopril (PRINIVIL; ZESTRIL) 5 mg, Oral, Daily, For heart and blood pressure   . metoprolol succinate ER (TOPROL XL) 50 mg, Oral, Daily   . nitroGLYCERIN (NITROSTAT) 0.4 mg, Sublingual, Every 5 min PRN, If no relief, call 911.      Social History:  Lives in [place].  Social EtOH, no tobacco, denies illicit drugs    Family History:  Non-contributory.    Review of Systems:   All systems were reviewed and negative except as noted above.     Exam: telemed  General: awake, alert, no acute distress  Skin: resolving ecchymosis on left forearm  Head: eyes white, moist mucous membranes    Neuro: awake, alert, appropriate, no focal deficits  Psych: answers questions appropriately     Laboratory Results:  Lab Results   Component Value Date    WBC 7.23 07/18/2021    HEMOGLOBIN 11.4 (L) 07/18/2021    HEMATOCRIT 36 07/18/2021    PLATELET 175 07/18/2021    SODIUM 139 07/18/2021    POTASSIUM 3.9 07/18/2021    BUN 18 07/18/2021    CREATININE 1.05 (H) 07/18/2021    GLUCOSE 166 (H) 07/18/2021    INR 1.0 07/17/2021    PROTIME 13.3 07/17/2021    PTT 33 07/15/2020    AST 16 07/15/2020    ALT 12 07/15/2020    ALK 66 07/15/2020    ALBUMIN 4.0 07/15/2020     Coronary angiography 11/18/2019  Left main: moderate caliber vessel which tapers rapidly by 50% to give rise to trifurcation of LAD, LCX, and Ramus.  LAD: small vessel occluded in the proximal segment, with competitive flow seen from the LIMA.  LCX: small vessel with luminal irregularities.   Ramus: moderate-large vessel supplying the lateral wall, occluded in the proximal segment. There is perhaps some antegrade filling which then sluggishly fills skip portion of SVG-RI-diag graft.   RCA: small vessel occluded in the proximal segment with right to right collaterals.    LIMA-LAD: small graft with minimal luminal irregularities, widely patent.  SVG-diagonal: severely diseased in the proximal-mid segment.   SVG-Ramus-diagonal: occluded just after take off from the aorta.  SVG-rPDA: widely patent but with severe disease at the insertion and proximal to RCA bifurcation.    Percutaneous coronary intervention 11/18/19:  1. Successful IVUS guided PCI of SVG-diagonal with 3.0(30) mm Resolute Onyx DES.  2. Diagnostic angiography demonstrating severe disease in SVG-diagonal and SVG-rPDA, occluded SVG-RI-diagonal, and patent LIMA-LAD.  3. Successful ultrasound guided 83F RFA access.  4. Perclose to the RFA.    Dobutamine stress TTE 11/03/2019:  1. Patient developed chest pain at peak stress, described to be similar to  the pain she felt before having her CABG, that resolved  during recovery.   2. Baseline ECG shows normal sinus rhythm with nonspecific ST/T-wave  changes.  At peak stress, there are inferior (III, aVF) ST elevations with reciprocal  ST depressions in leads I and aVL.   3. Baseline TTE shows normal LV size and systolic function (biplane EF 65%)  with no regional wall motion abnormalities. There is mild mitral annular  calcification, mild-moderate aortic stenosis, and mild-moderate aortic regurgitation.  4. At peak stress, there is hypokinesis to akinesis in the myocardial  segments as diagrammed.   5. In summary, there is ECG and echocardiographic evidence of inducible ischemia.    Echocardiogram 06/23/20:  1. The left ventricle is normal in size. There is mild concentric increase in the wall thickness of the left ventricle. Global left ventricular function is normal (biplane EF 65%). The left ventricular average longitudinal peak systolic strain is borderline (-17%). No regional wall motion abnormalities are present.  2. The right ventricle is normal size. The right ventricular systolic function is normal.  3. The aortic valve is mildly calcified. There is mild to moderate valvular aortic stenosis (Vmax 2.9 m/s, MG 20 mmHg, AVA 1.4 cm2). Mild to moderate aortic regurgitation is present.  4. Mild mitral annular calcification is present. The mitral annular calcification is posterior.  5. Agitated saline contrast study was negative at rest, with Valsalva, and with cough.  6. Pulmonary artery systolic pressure could not be estimated due to an insufficient TR jet.  7. There is no pericardial effusion.  Compared with the baseline TTE from 11/03/2019, no significant changes are noted.    Stress MPI 07/06/21  1. There was a normal hemodynamic response to pharmacological stress test.  2. Final ECG interpretation to be rendered by the Cardiology Service and found in a separate report.  3. Left ventricular cavity size is normal, EDVi 47 ml/m2.  4. Global left ventricular function is  normal at rest, LVEF 59% and mildly reduced at stress, EF 50%. Evidence of visual TID. Aberrant septal wall   motion may be consistent with post-operative state.  5. Scintigraphic evidence of a large area of severe inducible ischemia in the entire anterior, anterolateral, inferolateral, and inferior   walls. There is evidence of  nontransmural infarct in entire inferior wall.   Findings were relayed to Dr. [name] on 07/06/2021 at 11:20am.    PCI 07/17/21 - images reviewed today  Left main coronary artery: Moderate caliber vessel which gives rise to a moderate caliber LAD and moderate caliber ramus. 40% ostial stenosis.     LAD: Moderate caliber vessel which gives rise to a 2 moderate caliber diagonal branches.  Notably a vein graft with a skip segment supplies the first diagonal. The second diagonal is supplied by another independent vein graft.      Ramus: Moderate caliber vessel which gives rise to several smaller branches. The vessel fills via the native circulation with a tubular 50% stenosis of the proximal vessel.     Lcx: Anomalous vessel from the RCA. Small to moderate caliber vessel with a 70% ostial stenosis and serial 40% stenoses.     RCA: Mid RCA CTO with epicardial collaterals from the Lcx.     SVG- RCA: Heavily disease with 90% tubular mid vessel disease. 80% disease just distal to the anastomosis.     LIMA-LAD: Widely patent and supplies the mid to distal LAD.    SVG- diagonal: Occluded. Epicardial collaterals are present from apical LAD to diagonal.     SVG-Ramus-Diagonal: Occluded    Conclusions:  1. Unsuccessful RCA CTO PCI 2/2 fibrotic graft anastomosis  2. Successful PCI SVG-PDA with 3.5X53m Synergy DES. 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    Recent SPECT MPI demonstrated extensive area of severe ischemia involving the anterior, anterolateral, and inferolateral walls, as well as non-transmural inferior infarct, which certainly explains her severe exertional symptoms. She has significant symptom improvement after SVG-PDA PCI, which is reassuring, but is primarily a temporizing measure, as SVG PCI has less durable results. We discussed with her our recommendation to re-attempt RCA CTO PCI (with bifemoral access - there were significant issues with left radial access at previous attempt), which should allow more sustained revascularization.     She additionally has ischemia in the anterior and anterolateral wall, related to occlusion of her diagonal SVGs and subsequent closure of the diag SVG stent. We would also propose to re-assess the native diagonal(s), either in the same setting or staged after RCA CTO PCI.     We have discussed the benefits of percutaneous revascularization, primarily on symptoms and possibly longevity. We also discussed procedural risks including but not limited to stroke, death, need for emergent surgery, myocardial infarction, and vascular access complications. PMagdaleneunderstands and wishes to proceed.     DAPT: plavix+apixaban  Antianginals: amlodpine 10, ISMN 60, metoprolol succinate 50 daily, SL NTG  Not on a statin due to intolerance - consider adding ezetimibe - did not discuss today.     Plan:  Procedure: CTO PCI  Contrast allergy:  No  Moderate sedation concerns? NO   Anesthesia required? NO  Access: Bifemoral  Anticoagulation:   Hold anticoag: Hold DOAC for 48 hours  Anti-platelet plan: plavix and anticoagulation    Patient seen and plan developed with attending physician, Dr. KAlwyn Ren    BOthella Boyer MD  Cardiology Fellow  [facility] [place]

## 2021-08-09 NOTE — Progress Notes (Signed)
BHIP FOLLOW-UP NOTE   Type of Service:Psychotherapy  Duration of session:62min  This visit is being conducted over the telephone at the patient's request: Yes - we had a telemedicine visit scheduled but Wendy [name] had a hard time getting it to work so we changed to a phone visit.  Given the importance of social distancing and other strategies recommended to reduce the risk of COVID-19 transmission, I am providing care to this patient via a telephone visit in place of an in person visit at the request of the patient.      CURRENT MENTAL HEALTH CONCERNS/REASON(S) FOR VISIT:  Patient is a 76year old woman who prefers to be called Wendy [name]. She wasreferred by Dr. Ronalee [name], [facility] PCP in [facility],toBHIPfor treatment of depression. 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    Interval History:Wendy [name] says that while she was having a stent put in about a month ago a blood clot was released, they addressed it, but her arm has been purple and painful ever since. She's felt vulnerable. The experience got her thinking a lot about the ups and downs of her life. She's felt more depressed and anxious since the procedure. No AH/VH. She feels like she's on the road to recovery though and expects to feel better.    PHQ9:10  GAD7:8    Risk Assessment:No SI, HI or current safety concerns reported    Assessment of Progress or Regression and Expected Treatment Outcomes:Patient would benefit from continued treatment. Plan is to meet through May 2023.    DSM5Diagnoses:Anxiety    Intervention/Plan:We discuss our plan to meet through May of this year. She thinks that once she's done with BHIP she might want to focus more on [employer] [email or web address] for her well-being. 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  Care Coordinator discussed [facility]) and treatment alternatives.  No currentpsychiatric medication. If patient and her team decide to try psychiatric meds in the future she should be scheduled with [facility] psychiatrist Dr. Georgette [name], givenhercomplicated experiences in the past with psychiatric meds.  Counseling interventions:Supportive psychotherapy  Substance use interventions (if applicable):None  Patient will follow-up with:Care Coordinatorin 14 Days    The patient is able to participate in treatment and consents to the [facility] treatment plan.

## 2021-08-10 ENCOUNTER — Encounter (HOSPITAL_BASED_OUTPATIENT_CLINIC_OR_DEPARTMENT_OTHER): Payer: Self-pay

## 2021-08-10 ENCOUNTER — Ambulatory Visit: Payer: Medicare HMO | Attending: Cardiovascular Disease | Admitting: Cardiovascular Disease

## 2021-08-10 DIAGNOSIS — I25709 Atherosclerosis of coronary artery bypass graft(s), unspecified, with unspecified angina pectoris: Secondary | ICD-10-CM | POA: Insufficient documentation

## 2021-08-11 ENCOUNTER — Telehealth (HOSPITAL_BASED_OUTPATIENT_CLINIC_OR_DEPARTMENT_OTHER): Payer: Self-pay | Admitting: Cardiovascular Disease

## 2021-08-11 NOTE — Telephone Encounter (Signed)
Called and LVM with son to schedule procedure with Dr. [name].         Mina Rufina Falco) [place]  CC/[facility] Patient Care Coordinator  [place]  [phone]

## 2021-08-19 ENCOUNTER — Encounter (HOSPITAL_BASED_OUTPATIENT_CLINIC_OR_DEPARTMENT_OTHER): Payer: Self-pay | Admitting: Internal Medicine

## 2021-08-21 ENCOUNTER — Encounter (HOSPITAL_BASED_OUTPATIENT_CLINIC_OR_DEPARTMENT_OTHER): Payer: Self-pay | Admitting: Clinical

## 2021-08-21 NOTE — Telephone Encounter (Signed)
Per 07/17/21 PCI "Plavix 75 mg daily for at least 12 months, consider long-term for secondary prevention". MyChart reply sent.

## 2021-08-22 ENCOUNTER — Ambulatory Visit: Payer: Medicare HMO | Attending: Clinical | Admitting: Clinical

## 2021-08-22 DIAGNOSIS — F419 Anxiety disorder, unspecified: Secondary | ICD-10-CM | POA: Insufficient documentation

## 2021-08-22 NOTE — Progress Notes (Signed)
BHIP FOLLOW-UP NOTE   Type of Service:Psychotherapy  Duration of session: 45 min  This visit is being conducted over the telephone at the patient's request: Yes - we had a telemedicine visit scheduled but Elnita had a hard time getting it to work so we changed to a phone visit.  Given the importance of social distancing and other strategies recommended to reduce the risk of COVID-19 transmission, I am providing care to this patient via a telephone visit in place of an in person visit at the request of the patient.      CURRENT MENTAL HEALTH CONCERNS/REASON(S) FOR VISIT:  Patient is a 76year old woman who prefers to be called Kieth Brightly. She wasreferred by Dr. Ronalee Red, her PCP in [facility],toBHIPfor treatment of depression. 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    Interval History:Brendalyn says that she's felt much better since the stent, has more energy. She's thinking about taking a writing course at the senior center. She participates in activities at her building but really enjoys the different activities they offer at the senior center near her home. She's been going through her old writing and letters which has been rewarding. We discuss her relationships with her ex-husband and with her children.    PHQ9:Not completed  GAD7: Not completed  Sent via MyChart    Risk Assessment:No SI, HI or current safety concerns reported    Assessment of Progress or Regression and Expected Treatment Outcomes:Patient would benefit from continued treatment. Plan is to meet through May 2023.Note that thought process remains more loose and tangential than baseline. She is aware of her appointment later this week with her PCP.    DSM5Diagnoses:Anxiety    Intervention/Plan:Next appt in two weeks.   Care Coordinator discussed  [facility]) and treatment alternatives.  No currentpsychiatric medication. If patient and her team decide to try psychiatric meds in the future she should be scheduled with [facility] psychiatrist Dr. Georgette Dover, givenhercomplicated experiences in the past with psychiatric meds.  Counseling interventions:Supportive psychotherapy  Substance use interventions (if applicable):None  Patient will follow-up with:Care Coordinatorin 14 Days    The patient is able to participate in treatment and consents to the [facility] treatment plan.

## 2021-08-23 NOTE — Progress Notes (Signed)
SENIOR CARE SUBSEQUENT CARE VISIT NOTE     ID/CC:  76 year old community dwelling individual with CAD with CABG, PCI (11/2019, 07/2021), pAfib on chronic anticoag, osteoporosis, CKD, Depression/BPD, R CVA with residual deficits, asthma, being seen for memory and cards procedure followup:     Last seen in clinic: 06/22/21 by Dr. Ronalee Red    Interval History:  Baseline lives alone at apartment (senior housing, [place])  [place] today by no one. Transportation via [facility] or not: none needed    -07/06/21 - DSE  - 07/17/21 - Cath lab - PCI placed SVG-PDA, Plavix '75mg'$  daily for >12 months, RCA/CTO PCI attempt in 1 month, cardiac rehab referral  - 07/20/21 - Dr. Bridgett Larsson Cards - TTE for AS/AR in 2-3 years, follow up lipid clinic visit, can't tolerate statins, fu 4-6 months    Concerns today:     #Lower chest pain  Attie notes that yesterday when she was walking there was sudden onset of left upper quadrant pressure-like pain.  She describes it as a sensation of something inside pushing towards the outside.  She has felt this before prior to her most recent PCI procedure.  It felt different than her GERD symptoms.  She felt a little short of breath.  She took 3 tabs of nitro, 5 minutes apart.  After the third tablet the chest pain dissipated.    #CAD s/p CABG/PCI   Her left arm pain and bruising is much improved since her cath procedure with cardiology.  She has also noticed that she is less dyspneic on exertion, able to ambulate without issue.  She has been taking Plavix as prescribed.  Reports that in April they will repeat the PCI.    #Hyperlipidemia  She has a family and personal history of hyperlipidemia.  Previously tried statins which she could not tolerate.  She does take a fish oil supplement/eats a lot of fish.  She is open to seeing lipid clinic to explore other medication options.    #Memory difficulties: 04/06/21 MoCA 27/30, abnormal Maze test  She has no concerns about memory today.  A  RUDAS was 28 out of 30.    #Gastroesophageal reflux disease without esophagitis  Given her GERD symptoms she was prescribed pantoprazole 20 mg to start daily.  However she is concerned about side effects and feels that she can continue to manage with behavioral interventions.     #Prediabetes  She is unsure of what she can do to address her prediabetes.  Is hoping for a nutrition referral.  She is scheduled to see a nutritionist after her clinic appointment today.    Geriatric ROS: per 04/06/21 note, carried forward  ADLs:independent  IADLs:transportation as above [paratransit, recommended retiring from driving 74/25/95], food preparation doesn't like to cook,primarilyuses microwave. Okay with shopping, managing medications (medi-set) and finances.  Mood:prior diagnosis  Vision:eyeglasses  Hearing:some concerns  Continence:Yes  CFS-9: 4-5   Goals of Care: DPOA in chart not signed    Problem List  Patient Active Problem List   Diagnosis   . Nephrolithiasis   . Hyperparathyroidism ([place])   . Osteoporosis without current pathological fracture   . Non-functioning R kidney   . Essential hypertension   . Aortic stenosis, mild   . Asthma   . Bipolar disorder without psychotic features ([place])   . Coronary artery disease involving coronary bypass graft of native heart with angina pectoris   . Colon polyps   . Depression   .  Generalized anxiety disorder   . Mixed hyperlipidemia   . Migraine   . History of R parathyroid adenoma   . History of R parathyroidectomy (2017)   . Multinodular goiter   . Vitamin D deficiency   . Hx of CABG   . Aortic regurgitation   . Pulmonary nodule less than 6 cm determined by computed tomography of lung   . Abnormal computed tomography of pelvis   . Body mass index 40.0-44.9, adult ([place])   . History of R metatarsal fractures   . Primary osteoarthritis of right knee   . Esophageal dysphagia   . Gastroesophageal reflux disease without esophagitis   . Diastolic dysfunction   . History of R MCA  cerebrovascular accident (CVA) with residual deficit   . Paroxysmal atrial fibrillation (HCC)   . Postmenopausal bleeding   . DISH (diffuse idiopathic skeletal hyperostosis)   . Chronic anticoagulation   . Other chest pain   . Dyspnea on exertion   . Memory difficulties   . Stage 3a chronic kidney disease ([place])   . Abnormal glucose   . CAD S/P percutaneous coronary angioplasty        Outpatient Medication List:  Current Outpatient Medications   Medication Sig Dispense Refill   . acetaminophen 500 MG tablet Take 1 tablet (500 mg) by mouth every 6 hours as needed.     Marland Kitchen amLODIPine 10 MG tablet Take 1 tablet (10 mg) by mouth daily. 90 tablet 2   . apixaban (Eliquis) 5 MG tablet Take 1 tablet (5 mg) by mouth 2 times a day. Blood thinner for stroke prevention. 180 tablet 1   . cetirizine 10 MG tablet Take 1 tablet by mouth daily For allergy symptoms 90 tablet 0   . clopidogrel 75 MG tablet Take 1 tablet (75 mg) by mouth daily. 30 tablet 11   . cyanocobalamin 1000 MCG tablet Take 1 tablet (1,000 mcg) by mouth daily. For vitamin B-12 and memory 90 tablet 3   . ipratropium 0.03 % nasal spray Spray 2 sprays into each nostril 2 times a day. For nasal congestion. Replaces fluticasone 30 mL 1   . isosorbide mononitrate ER 60 MG 24 hr tablet Take 1 tablet (60 mg) by mouth every morning. 30 tablet 1   . lisinopril 5 MG tablet TAKE ONE TABLET BY MOUTH ONE TIME DAILY (Patient taking differently: Take 1 tablet (5 mg) by mouth every evening.) 90 tablet 2   . metoprolol succinate ER 50 MG 24 hr tablet Take 1 tablet (50 mg) by mouth daily. (Patient taking differently: Take 1 tablet (50 mg) by mouth daily. Nightly) 90 tablet 3   . nitroGLYCERIN 0.4 MG SL tablet Place 1 tablet (0.4 mg) under the tongue every 5 minutes as needed for chest pain for up to 3 doses. If no relief, call 911. 25 tablet 1   . pantoprazole 20 MG EC tablet Take 2 tablets (40 mg) by mouth daily on an empty stomach. 30 tablet 1     No current facility-administered  medications for this visit.      Over the Counter/Other Supplements: not asked     Allergies:  Review of patient's allergies indicates:  Allergies   Allergen Reactions   . Hydrochlorothiazide Skin: Itching and Throat Swelling   . Lidocaine Other     Hallucinations   . Penicillins Skin: Hives     Reaction in [place] (??)  Has patient had a PCN reaction causing immediate rash, facial/tongue/throat swelling,  SOB or lightheadedness with hypotension: Yes  Has patient had a PCN reaction causing severe rash involving mucus membranes or skin necrosis: No  Has patient had a PCN reaction that required hospitalization: No  Has patient had a PCN reaction occurring within the last 10 years: No  If all of the above answers are "NO", then may proceed with Cephalosporin use.     . Coconut Fatty Acids      Other reaction(s): Other (See Comments)  Reaction??   . Latex Skin: Itching   . Losartan Headache and Other     Other reaction(s): Other (See Comments), Unknown  Pt doesn't remember.  High blood pressure   . Statins Headache and Other     Other reaction(s): Other (See Comments), Other (See Comments) high blood pressure, ear ache and foggy thinking         Social History:   Lives: Apartment   Marital Status: not asked  Children: Mentions son and daughter   Substance Use: not asked  Firearms: not asked  Nutrition: Notes eating a lot of chocolate, frozen meals, fish   Health Literacy: high  Occupation: not asked  DPOA/Legal Next of Kin/main contact person/main contact #s: daughter Hulan Saas, son Vahid    Physical Exam:  BP 132/60   Pulse (!) 55   Temp 36.3 C (Temporal)   Wt (!) 99.7 kg (219 lb 14.4 oz)   SpO2 98%   BMI 42.95 kg/m   Wt Readings from Last 3 Encounters:   08/24/21 (!) 99.7 kg (219 lb 14.4 oz)   07/17/21 99 kg (218 lb 4.1 oz)   06/22/21 (!) 99.4 kg (219 lb 3.2 oz)     GEN: Alert, NAD, well kempt  HEENT:     Ears: R ear with some external wax; bilateral TM intact    Thyroid: Soft, no palpable nodules     TMJ:  L side noticeable clicking.   Eyes: anicteric conjunctivae   CARDIOVASC: 2+ radial pulses, RRR, S1 and S2, 3/6 systolic murmur RUSB   CHEST/PULM: CTAB   MUSCULOSKEL: Trace LE edema   SKIN: Warm and dry   NEURO: Oriented to conversation, sometimes side tracked    Labs/Recent Imaging Results: Cardiology tab for EKG      Assessment and Plan: 76 year old community dwelling individual with CAD with CABG, PCI (11/2019, 07/2021), pAfib on chronic anticoag, osteoporosis, CKD, Depression/BPD, R CVA with residual deficits, asthma, being seen for memory and cards procedure followup. Functionally stable.     #LUQ pain  The pressure type nature of her LUQ pain with exertion is concerning for angina.  Given the location it may also be related to stomach pain, though Alanni notes that the sensation was different and lower than her usual GERD pain.  Will repeat EKG to check for any changes.  Plan:  - Repeat EKG    #CAD s/p CABG/PCI   #Hyperlipidemia  She has extensive atherosclerotic disease of her coronary vessels.  Due for repeat cath procedure in April per patient report. Since her last procedure she notices improvement in her dyspnea. Reviewed that lowering her cholesterol would be very beneficial for heart health.  She expresses understanding and is interested in following up with lipid clinic to explore alternatives to statins for cholesterol reduction.  Plan:   - Pending lipid clinic appointment (referred previously)  - Continue Plavix for at least 12 months per cardiology     #Memory difficulties  She has no concerns about memory today. RUDAS was  28 out of 30, which is within normal limits. 04/06/21 MoCA 27/30 which is also within normal limits, abnormal Maze test. She does seem mildly disorganized on history taking today. May need some assistance with scheduling follow up / referral appointments.   Plan:   - Check to see if above follow ups arranged  - Consider neuropsych testing in future     #Gastroesophageal reflux disease  without esophagitis  Concerned about PPI side effects, continue behavioral interventions for now.   Plan:   - Recommended minimum 2-hour interval between eating and lying down    #Hx CVA  #Essential hypertension  Continue current regimen  - Metoprolol succinate '50mg'$  + lisinopril '5mg'$  + amlodipine '10mg'$  + Imdur '60mg'$      #afib  - Continue eliquis '5mg'$  BID     #Stage 3a chronic kidney disease ([place])  Also note non-functioning R kidney  - Lab: BMP    #Abnormal glucose  To see nutrition today 08/24/21. A1C 06/22/21 was 5.8.   - Repeat A1c next visit    -------- To follow up in future ------    #B12 deficiency   03/2021 B12 was 220. Methylmalonic Acid wnl (06/2021, 0.19). Per chart, says she is taking B12 supplement.   - Repeat B12 lab next visit     #Hyperparathyroidism [facility])  #History of R parathyroidectomy (2017)  #History of R parathyroid adenoma  Last seen by endocrine 2021.   Last labs 06/22/21 -  PTH at 106 (elevated)  - Basic Metabolic Panel -- monitor hypercalcemia  - Parathyroid Hormone  - Vitamin D     #Multinodular goiter history  Thyroid exam 08/24/21 no palpable nodules. 06/22/21 2.011 TSH wnl. Ultrasound ordered but delayed pending cardiac procedures.   - US Soft Tissue Head and Neck; Future     #ACP  DPOA in Epic but not signed.     #Relevant Health Care Maintenance:  Labs: Per above  Immunizations:   Immunization History   Administered Date(s) Administered   . COVID-19 Moderna mRNA 12 yrs and older 07/03/2019, 08/11/2019, 04/14/2020, 10/04/2020   . [place] mRNA bivalent booster 12 yrs and older 03/14/2021   . Influenza quadrivalent MDCK PF (Flucelvax) 04/18/2019   . Influenza quadrivalent PF 07/11/2017   . Influenza quadrivalent adjuvanted (Fluad) 03/17/2020, 03/14/2021   . Influenza trivalent high-dose 03/04/2018   . Pneumococcal conjugate PCV13 (Prevnar 13) 05/21/2014   . Pneumococcal polysaccharide PPSV23 (Pneumovax 23) 11/14/2012   . Tdap 11/14/2012     Relevant Health Care Maintenance: From 06/22/21  note  - Labs: diabetes screening HbA1c 5.7% 06/23/20 repeat due today; lipids 06/23/20 repeat due today; TSH 08/10/20 repeat today; 25-OH vitamin D normal 07/16/19; vitamin B-12 220 04/06/21 check methylmalonic acid  - Supplements: vitamin B-12  - Immunizations: defer discussion recombinant zoster vaccine to future appointment   - Osteoporosis screening (DEXA): indicated N/A known osteoporosis. Last DEXA 05/2020 next due ~05/2022.  - Abdominal aortic aneurysm screening: indicated NO per USPSTF guideline (female, never smoked)  - Colon cancer screening: indicated YES defer discussion to future appointment due to dyspnea on exertion   - Mammogram/breast cancer screening: indicated YES defer discussion to future appointment due to dyspnea on exertion       #RTC in: 2 months for routine follow up

## 2021-08-24 ENCOUNTER — Ambulatory Visit (HOSPITAL_BASED_OUTPATIENT_CLINIC_OR_DEPARTMENT_OTHER): Payer: Medicare HMO | Admitting: Nursing

## 2021-08-24 ENCOUNTER — Ambulatory Visit (HOSPITAL_BASED_OUTPATIENT_CLINIC_OR_DEPARTMENT_OTHER): Payer: Medicare HMO | Admitting: Registered"

## 2021-08-24 ENCOUNTER — Ambulatory Visit: Payer: Medicare HMO | Attending: Internal Medicine | Admitting: Internal Medicine

## 2021-08-24 VITALS — BP 132/60 | HR 55 | Temp 97.3°F | Wt 219.9 lb

## 2021-08-24 DIAGNOSIS — I129 Hypertensive chronic kidney disease with stage 1 through stage 4 chronic kidney disease, or unspecified chronic kidney disease: Secondary | ICD-10-CM

## 2021-08-24 DIAGNOSIS — R7303 Prediabetes: Secondary | ICD-10-CM

## 2021-08-24 DIAGNOSIS — R001 Bradycardia, unspecified: Secondary | ICD-10-CM | POA: Insufficient documentation

## 2021-08-24 DIAGNOSIS — E782 Mixed hyperlipidemia: Secondary | ICD-10-CM | POA: Insufficient documentation

## 2021-08-24 DIAGNOSIS — R4189 Other symptoms and signs involving cognitive functions and awareness: Secondary | ICD-10-CM | POA: Insufficient documentation

## 2021-08-24 DIAGNOSIS — I498 Other specified cardiac arrhythmias: Secondary | ICD-10-CM | POA: Insufficient documentation

## 2021-08-24 DIAGNOSIS — H6120 Impacted cerumen, unspecified ear: Secondary | ICD-10-CM | POA: Insufficient documentation

## 2021-08-24 DIAGNOSIS — I10 Essential (primary) hypertension: Secondary | ICD-10-CM | POA: Insufficient documentation

## 2021-08-24 DIAGNOSIS — I25709 Atherosclerosis of coronary artery bypass graft(s), unspecified, with unspecified angina pectoris: Secondary | ICD-10-CM | POA: Insufficient documentation

## 2021-08-24 DIAGNOSIS — R7309 Other abnormal glucose: Secondary | ICD-10-CM | POA: Insufficient documentation

## 2021-08-24 DIAGNOSIS — I6930 Unspecified sequelae of cerebral infarction: Secondary | ICD-10-CM | POA: Insufficient documentation

## 2021-08-24 DIAGNOSIS — Z713 Dietary counseling and surveillance: Secondary | ICD-10-CM | POA: Insufficient documentation

## 2021-08-24 DIAGNOSIS — N1831 Chronic kidney disease, stage 3a: Secondary | ICD-10-CM | POA: Insufficient documentation

## 2021-08-24 DIAGNOSIS — K219 Gastro-esophageal reflux disease without esophagitis: Secondary | ICD-10-CM | POA: Insufficient documentation

## 2021-08-24 DIAGNOSIS — E213 Hyperparathyroidism, unspecified: Secondary | ICD-10-CM | POA: Insufficient documentation

## 2021-08-24 DIAGNOSIS — M26609 Unspecified temporomandibular joint disorder, unspecified side: Secondary | ICD-10-CM | POA: Insufficient documentation

## 2021-08-24 NOTE — Progress Notes (Signed)
I personally performed the physical examination and medical decision making. I have verified all of the medical student's documentation for this encounter.    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

## 2021-08-24 NOTE — Progress Notes (Signed)
Actual score 28

## 2021-08-24 NOTE — Progress Notes (Signed)
Nutrition Note    Clinic:  [facility]    Note Type: New    Reason For Visit: Hyperlipidemia    An interpreter was not needed for the visit.    Assessment    Patient Active Problem List   Diagnosis   . Nephrolithiasis   . Hyperparathyroidism ([place])   . Osteoporosis without current pathological fracture   . Non-functioning R kidney   . Essential hypertension   . Aortic stenosis, mild   . Asthma   . Bipolar disorder without psychotic features ([place])   . Coronary artery disease involving coronary bypass graft of native heart with angina pectoris   . Colon polyps   . Depression   . Generalized anxiety disorder   . Mixed hyperlipidemia   . Migraine   . History of R parathyroid adenoma   . History of R parathyroidectomy (2017)   . Multinodular goiter   . Vitamin D deficiency   . Hx of CABG   . Aortic regurgitation   . Pulmonary nodule less than 6 cm determined by computed tomography of lung   . Abnormal computed tomography of pelvis   . Body mass index 40.0-44.9, adult ([place])   . History of R metatarsal fractures   . Primary osteoarthritis of right knee   . Esophageal dysphagia   . Gastroesophageal reflux disease without esophagitis   . Diastolic dysfunction   . History of R MCA cerebrovascular accident (CVA) with residual deficit   . Paroxysmal atrial fibrillation (HCC)   . Postmenopausal bleeding   . DISH (diffuse idiopathic skeletal hyperostosis)   . Chronic anticoagulation   . Other chest pain   . Dyspnea on exertion   . Memory difficulties   . Stage 3a chronic kidney disease ([place])   . Abnormal glucose   . CAD S/P percutaneous coronary angioplasty     Weight History:   08/24/2021 219 lb 14.4 oz         BMI: 42.95    Adult BMI Classification: obese categ. III (>40)     Labs:    Latest Ref Rng 07/16/2019 06/23/2020 06/22/2021   Cholesterol       Total Cholesterol <200 mg/dL 263 (H)  283 (H)  271 (H)    HDL Cholesterol >39 mg/dL 43  46  47    LDL Cholesterol <130 mg/dL 186 (H)  203 (H)  195 (H)    Triglycerides <150 mg/dL 168  (H)  168 (H)  155 (H)    Total/HDL Ratio  6.1  6.2  5.8    Non-HDL Chol 0 - 159 mg/dL 220 (H)  237 (H)  224 (H)        Latest Ref Rng 06/22/2021   DIABETES [facility] AMB     A1C 4.0 - 6.0 % 5.8      Self monitored blood glucose: n/a    Medications inlcude:   Cyanocobalamin for memory    Vitamins/Minerals/Herbal Supplements: not discussed    Barriers to adequate intake: None    GI tolerance: not discussed    Activity: not discussed    Food intolerances/allergies: None; chokes on coconut oil    Diet History:   "icky cooker"  Mainly frozen meals and fish  Avoid almonds and spinach d/t oxalates  Eat a lot of cheese  Would like to incorporate garbanzo beans    B: hardboiled egg w/ poppy seed dressing sometimes plant based meat patty or fruit; sometimes breakfast burrito     Lots of nuts and  seeds: pistachios, cashews, macademia, pecan  Trying to include high fiber foods     Information from Patient:   Unable to take statins so interested in managing cholesterol w/ diet    Assessment Summary: 76 year old female with pre-diabetes, CAD, hyperlipidemia.    Nutrition Diagnosis: Food and nut related knowledge deficit  related to limited prior education evidenced by questions during visit  Some high saturated foods though our visit was brief d/t late start and pt does not want to miss access  Motivated to make changes to lower cholesterol    Interventions and Patient Goals:   Focused on saturated fat at today's visit.      Recommended:    Limit saturated fat to 10- 15 grams per day.    Limit total fat to 50 grams per day with majority from monounsaturated fats: olive oil, canola oil, nuts/seeds    Education Provided:  Please refer to Education section of chart     Education materials provided: recipe for crispy garbanzo beans    Time Spent with Patient: 15 mins    Plan/Recommendations    Follow up: 09/01/21- focus on prevention of diabetes and fiber     Total Time Spent:  30 mins    Ed Blalock, MS, RD, CD, [facility]  Dietitian  Department mobile: [phone]. [phone]  Email: [email or web address]

## 2021-08-24 NOTE — Patient Instructions (Addendum)
Thank you for choosing [facility] for your care. You saw Elaina Hoops (4th-year medical student) and Ether Griffins, MD, MPH (attending physician) today at [facility]. Please call [phone] option #2 with any urgent questions or concerns.    Come back to 3rd floor [facility] lobby for 11:30 registered dietitian appointment  Access pick up at 12:15 PM    Please stop by lab on ground floor for blood draw    Heart procedure as scheduled [phone]    Cholesterol/lipid clinic specialists are trying to reach you to schedule appointment to help prevent future heart attacks     Ears: not too much wax so will re-check at next appointment    Will come back to jaw pain at next appointment

## 2021-08-24 NOTE — Progress Notes (Signed)
EKG performed w/o incident. Results given to Dr. Ronalee Red and pt cleared to leave.

## 2021-08-24 NOTE — Patient Instructions (Addendum)
Limit saturated fat to 10- 15 grams per day.    Limit total fat to 50 grams per day with majority from monounsaturated fats: olive oil, canola oil, nuts/seeds

## 2021-08-25 ENCOUNTER — Telehealth (HOSPITAL_BASED_OUTPATIENT_CLINIC_OR_DEPARTMENT_OTHER): Payer: Self-pay

## 2021-08-25 ENCOUNTER — Encounter (HOSPITAL_BASED_OUTPATIENT_CLINIC_OR_DEPARTMENT_OTHER): Payer: Self-pay | Admitting: Nursing

## 2021-08-25 NOTE — Telephone Encounter (Signed)
Message  Received: Patsy [place], Jeanett Schlein, MD  P [facility], possible to help w/scheduling initial appt? May need to contact son or daughter, she's sometimes disorganized. Thanks.       Called pt, no answer, left voicemail, called pt son, no answer, left voicemail, called pt daughter, no answer, left voicemail.

## 2021-08-27 LAB — EKG 12 LEAD
Atrial Rate: 54 {beats}/min
P Axis: 62 degrees
P-R Interval: 224 ms
Q-T Interval: 442 ms
QRS Duration: 90 ms
QTC Calculation: 419 ms
R Axis: 56 degrees
T Axis: 134 degrees
Ventricular Rate: 54 {beats}/min

## 2021-08-31 ENCOUNTER — Other Ambulatory Visit (HOSPITAL_BASED_OUTPATIENT_CLINIC_OR_DEPARTMENT_OTHER): Payer: Self-pay | Admitting: Internal Medicine

## 2021-08-31 DIAGNOSIS — I2510 Atherosclerotic heart disease of native coronary artery without angina pectoris: Secondary | ICD-10-CM

## 2021-08-31 MED ORDER — LISINOPRIL 5 MG OR TABS
5.0000 mg | ORAL_TABLET | Freq: Every evening | ORAL | 3 refills | Status: DC
Start: 2021-08-31 — End: 2022-05-31

## 2021-08-31 NOTE — Telephone Encounter (Signed)
Received fax dated 08/29/21 from [facility] on file requesting lisinopril fill #100 instead of #90 since larger quantity less expensive for patient. Rx refilled. Repeat labs due and ordered previously.

## 2021-09-01 ENCOUNTER — Encounter (HOSPITAL_BASED_OUTPATIENT_CLINIC_OR_DEPARTMENT_OTHER): Payer: Self-pay | Admitting: Internal Medicine

## 2021-09-01 ENCOUNTER — Ambulatory Visit (HOSPITAL_BASED_OUTPATIENT_CLINIC_OR_DEPARTMENT_OTHER): Payer: Medicare HMO

## 2021-09-01 NOTE — Telephone Encounter (Signed)
Noted and agree w/RN advice.

## 2021-09-01 NOTE — Telephone Encounter (Signed)
vm message left w/ pt requesting a CB to further f/u on problems w/ diarrhea. CB# provided  Forwarding message to Ed Blalock RD  And Dr Ronalee Red

## 2021-09-04 ENCOUNTER — Other Ambulatory Visit (HOSPITAL_BASED_OUTPATIENT_CLINIC_OR_DEPARTMENT_OTHER): Payer: Self-pay | Admitting: Cardiovascular Disease

## 2021-09-04 DIAGNOSIS — I2510 Atherosclerotic heart disease of native coronary artery without angina pectoris: Secondary | ICD-10-CM

## 2021-09-05 ENCOUNTER — Other Ambulatory Visit (HOSPITAL_BASED_OUTPATIENT_CLINIC_OR_DEPARTMENT_OTHER): Payer: Self-pay | Admitting: Cardiovascular Disease

## 2021-09-05 DIAGNOSIS — I2510 Atherosclerotic heart disease of native coronary artery without angina pectoris: Secondary | ICD-10-CM

## 2021-09-05 MED ORDER — ISOSORBIDE MONONITRATE ER 60 MG OR TB24
EXTENDED_RELEASE_TABLET | ORAL | 0 refills | Status: DC
Start: 2021-09-05 — End: 2021-10-03

## 2021-09-06 ENCOUNTER — Ambulatory Visit: Payer: Medicare HMO | Attending: Clinical | Admitting: Clinical

## 2021-09-06 DIAGNOSIS — F419 Anxiety disorder, unspecified: Secondary | ICD-10-CM | POA: Insufficient documentation

## 2021-09-06 DIAGNOSIS — F32A Depression, unspecified: Secondary | ICD-10-CM | POA: Insufficient documentation

## 2021-09-06 DIAGNOSIS — F411 Generalized anxiety disorder: Secondary | ICD-10-CM | POA: Insufficient documentation

## 2021-09-06 DIAGNOSIS — F319 Bipolar disorder, unspecified: Secondary | ICD-10-CM | POA: Insufficient documentation

## 2021-09-06 DIAGNOSIS — I63511 Cerebral infarction due to unspecified occlusion or stenosis of right middle cerebral artery: Secondary | ICD-10-CM | POA: Insufficient documentation

## 2021-09-06 DIAGNOSIS — N950 Postmenopausal bleeding: Secondary | ICD-10-CM | POA: Insufficient documentation
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
## 2021-09-11 ENCOUNTER — Encounter (HOSPITAL_BASED_OUTPATIENT_CLINIC_OR_DEPARTMENT_OTHER): Payer: Self-pay | Admitting: Internal Medicine

## 2021-09-11 NOTE — Nursing Note (Addendum)
Pre-Procedure Instructions for Percutaneous Coronary Intervention     Thank you for selecting  of [facility]) for your procedure.     Our address: [place]    Procedure Date and Check-in Information    Date:   April 10                                         Check-in Time:  8:00 AM     Check-in Location: PACIFIC ADMITING check in - 2nd Floor   Address: [address] [place] [address]      ? Use the main hospital entrance on [address]  ? Once you enter the main lobby of [facility], turn right and take the  [place] to Wendy [name] down to the 2nd floor. Turn left as you exit the elevator.   ? Check in at [place] .    General Instructions    ? PLEASE BRING A CURRENT LIST OF ALL MEDICATIONS THE DAY OF PROCEDURE  ?  Do not eat or drink 6 hours prior to your check in time. You may take sips of water until 2 hours prior to the check in time.   ? Do not eat high fat foods such as bacon,sausage, or eggs for at least 8 hrs before your check in time .   ? Pleasetake your medications as prescribed including clopidogrel.   ? Avoid any other over the counter supplements at least 5 days prior to procedure.      ? If you are taking anticoagulants .   - Hold  apixaban (Eliquis) 2 days (48 hrs)prior to your procedure. Do not take Eliquis on April 8,9 and morning of 10th      ? If you use a CPAP machine at home, please bring this with you to use during the procedure and for your overnight stay.    ? Follow up appointments mustbe scheduled with your primary cardiologist within 2-4 weeksafter your procedure. Please direct any medication refill requests to your primary cardiologistand procedure records and images will be sent directly to the referring cardiologist approximately 1 week after your procedure.   ? Most patients typically spend one night in the hospital after the procedure then [place] home the next morning. You will need someone to  drive you home. Please wait 24-48 hrs after procedure before driving.  You cannot drive yourself home or take a bus, taxi or shuttle by yourself.     If you have any clinical questions regarding your procedure, please callThe [facility] at [phone].Our afterhours number is [phone].       __________________________________________________________________________________  PRE CATH SCREENING QUESTIONS :      YES NO Notes    '[]'$ ?  '[]'$ ?      '[]'$ ?  '[x]'$ ?     Any travel (Domestic or international) in the last month? '[]'$ ?  '[x]'$ ?     Any Allergy to contrast dye/iodine?   If yes: Pretreatment ordered? YES '[]'$ ?  NO '[]'$ ?  '[]'$ ?  '[x]'$ ?     History or suspected OSA? (Snore, Tired, Apnea, HTN, BMI>35, Age >67, Female) '[x]'$ ?  '[]'$ ?  Pending sleep study fitting for CPAP   Uses/Compliant with CPAP?   If yes: Bring CPAP  If no: Consult Anesthesia on day of procedure  '[]'$ ?  '[x]'$ ?     Home O2? '[]'$ ?  [  x]?     History of COPD?  If with advanced lung disease on Home O2, consult Anesthesia on day of procedure. '[]'$ ?  '[]'$ ?  asthma    Able to walk 2 blocks or climb 2 flights of stairs without getting SOB? '[]'$ ?  '[x]'$ ?  SOB    Able to lay flat without getting SOB? '[x]'$ ?  '[]'$ ?  Needs wedge under her knees   Any problems with sedation or anesthesia in the past? '[]'$ ?  '[x]'$ ?     Have you been told by an anesthesiologist that you have a difficult airway or that they consider you to be a difficult intubation? '[]'$ ?  '[x]'$ ?     Long term opioids for chronic pain? '[]'$ ?  '[x]'$ ?     EF >20%? '[]'$ ?  '[]'$ ?  Most Recent EF:59%    ESRD? '[]'$ ?  '[x]'$ ?  Only has left kidney functioning    ESLD? '[]'$ ?  '[x]'$ ?     Pulmonary HTN? (PAS >37mHg) '[]'$ ?  '[x]'$ ?     Neurological or neuromuscular disorders compromising respiratory or swallowing functions? '[]'$ ?  '[x]'$ ?     Are you taking any Antiplatelets?  ASA, Clopidogrel (Plavix), prasugrel (Effient), Ticagrelor (Brilinta) '[]'$ ?  '[x]'$ ?     Are you taking any Anticoagulants?  Warfarin (Coumadin), dabigatran (Pradaxa), apixaban  (Eliquis), rivaroxaban (xarelto) '[x]'$ ?  '[]'$ ?  Eliquis- PAF   Are you taking any Diabetic medications?  Insulin, Metformin '[]'$ ?  '[x]'$ ?     Are you taking any diuretics? '[]'$ ?  '[x]'$ ?     Have you fallen in the last 12 months? '[]'$ ?  '[x]'$ ?     Do you use any assistive devices with ambulation?  Cane, walker, wheelchair '[x]'$ ?  '[]'$ ?  walker (standard)    Do you have a reliable ride home? '[x]'$ ?  '[]'$ ?           Estimated body mass index is 42.81 kg/m as calculated from the following:    Height as of 04/03/21: 5' (1.524 m).    Weight as of 06/22/21: 99.4 kg (219 lb 3.2 oz).                  BMI 40-45 = sedating RN to assess ( if anesthesia needed , will be scheduled day of)                 BMI > 45 = anesthesia required ( schedule w/ pre-anesthesia clinic)        Screening Tool for Moderate Sedation Anesthesia Consultation                 Absolute Contraindications: Schedule Procedure with Anesthesia!    '[]'$ ? Morbid Obesity BMI >45    '[]'$ ? Pregnancy >[redacted] weeks gestation    '[]'$ ? Previous adverse reaction to sedation / anesthesia    '[]'$ ? Known difficult airway for ventilation or intubation     '[]'$ ? Neurological or neuromuscular disorders compromising respiratory or swallowing function     '[]'$ ? ASA Class V                               Mallampati Class III & IV = Potential difficult intubation (Consider anesthesia consult)    Mandatory Consults: If Consult indicated: Consult will be done day of procedure.     '[]'$ ? OSA non-compliant with CPAP treatment    '[x]'$ ? BMI 40-45 (flag for on arrival stop bang) and STOP-BANG score >4    '[]'$ ? Advanced lung disease on  home oxygen    '[]'$ ? Severe Aortic Stenosis (Valve Area <0.8 cm2)  =  EXCEPTION for CATH PROCEDURES     '[]'$ ? Unintubated TBI (GCS <15: proceed with caution, GCS 10-12: Anesthesia Consult    '[]'$ ? End-Stage Renal Failure with:  Known OSA and non-compliant with CPAP         OR Obesity (BMI 40-45 with STOP-BANG >4) OR Diabetes on insulin infusion    '[]'$ ? End-Stage Liver Disease  with:  Encephalopathy requiring treatment          OR #2 of the following:  Child's C, MELD >15, Na <128       SCREENING OUTCOME:     '[x]'$ ? Sedating RN to assess - OSA no CPAP     '[]'$ ? Cleared for Moderate Sedation       '[]'$ ? Anesthesia Required    '[]'$ ? Anesthesia Consult Required

## 2021-09-12 ENCOUNTER — Encounter (HOSPITAL_COMMUNITY): Payer: Self-pay | Admitting: Psychiatry

## 2021-09-13 ENCOUNTER — Other Ambulatory Visit (HOSPITAL_BASED_OUTPATIENT_CLINIC_OR_DEPARTMENT_OTHER): Payer: Self-pay | Admitting: Internal Medicine

## 2021-09-13 ENCOUNTER — Ambulatory Visit: Payer: Medicare HMO | Attending: Internal Medicine

## 2021-09-13 DIAGNOSIS — I6930 Unspecified sequelae of cerebral infarction: Secondary | ICD-10-CM

## 2021-09-13 DIAGNOSIS — R7309 Other abnormal glucose: Secondary | ICD-10-CM | POA: Insufficient documentation

## 2021-09-13 DIAGNOSIS — I10 Essential (primary) hypertension: Secondary | ICD-10-CM | POA: Insufficient documentation

## 2021-09-13 DIAGNOSIS — N1831 Chronic kidney disease, stage 3a: Secondary | ICD-10-CM | POA: Insufficient documentation

## 2021-09-13 DIAGNOSIS — E213 Hyperparathyroidism, unspecified: Secondary | ICD-10-CM | POA: Insufficient documentation

## 2021-09-13 DIAGNOSIS — I25709 Atherosclerosis of coronary artery bypass graft(s), unspecified, with unspecified angina pectoris: Secondary | ICD-10-CM | POA: Insufficient documentation

## 2021-09-13 LAB — BASIC METABOLIC PANEL
Anion Gap: 6 (ref 4–12)
Calcium: 9.6 mg/dL (ref 8.9–10.2)
Carbon Dioxide, Total: 30 meq/L (ref 22–32)
Chloride: 104 meq/L (ref 98–108)
Creatinine: 1 mg/dL (ref 0.38–1.02)
Glucose: 130 mg/dL — ABNORMAL HIGH (ref 62–125)
Potassium: 4 meq/L (ref 3.6–5.2)
Sodium: 140 meq/L (ref 135–145)
Urea Nitrogen: 24 mg/dL — ABNORMAL HIGH (ref 8–21)
eGFR by CKD-EPI 2021: 59 mL/min/{1.73_m2} — ABNORMAL LOW (ref >59)

## 2021-09-14 ENCOUNTER — Other Ambulatory Visit: Payer: Self-pay

## 2021-09-14 ENCOUNTER — Encounter (HOSPITAL_BASED_OUTPATIENT_CLINIC_OR_DEPARTMENT_OTHER): Payer: Self-pay | Admitting: Cardiovascular Disease

## 2021-09-14 ENCOUNTER — Encounter (HOSPITAL_BASED_OUTPATIENT_CLINIC_OR_DEPARTMENT_OTHER): Payer: Self-pay | Admitting: Critical Care Medicine

## 2021-09-14 DIAGNOSIS — Z01818 Encounter for other preprocedural examination: Secondary | ICD-10-CM

## 2021-09-17 ENCOUNTER — Other Ambulatory Visit (HOSPITAL_BASED_OUTPATIENT_CLINIC_OR_DEPARTMENT_OTHER): Payer: Self-pay | Admitting: Internal Medicine

## 2021-09-17 DIAGNOSIS — R7309 Other abnormal glucose: Secondary | ICD-10-CM

## 2021-09-18 ENCOUNTER — Other Ambulatory Visit: Payer: Self-pay

## 2021-09-18 ENCOUNTER — Encounter (HOSPITAL_COMMUNITY): Payer: Self-pay

## 2021-09-18 ENCOUNTER — Encounter (HOSPITAL_COMMUNITY)
Admission: RE | Disposition: A | Discharge status: Home / Self Care | Payer: Self-pay | Source: Home / Self Care | Attending: Cardiovascular Disease

## 2021-09-18 ENCOUNTER — Inpatient Hospital Stay (HOSPITAL_COMMUNITY): Payer: Self-pay | Admitting: Cardiovascular Disease

## 2021-09-18 ENCOUNTER — Inpatient Hospital Stay
Admission: RE | Admit: 2021-09-18 | Discharge: 2021-09-22 | DRG: 251 | Disposition: A | Discharge status: Home / Self Care | Payer: Medicare HMO | Attending: Cardiovascular Disease | Admitting: Cardiovascular Disease

## 2021-09-18 DIAGNOSIS — Z01818 Encounter for other preprocedural examination: Secondary | ICD-10-CM

## 2021-09-18 DIAGNOSIS — I129 Hypertensive chronic kidney disease with stage 1 through stage 4 chronic kidney disease, or unspecified chronic kidney disease: Secondary | ICD-10-CM | POA: Diagnosis present

## 2021-09-18 DIAGNOSIS — E669 Obesity, unspecified: Secondary | ICD-10-CM | POA: Diagnosis present

## 2021-09-18 DIAGNOSIS — I480 Paroxysmal atrial fibrillation: Secondary | ICD-10-CM

## 2021-09-18 DIAGNOSIS — M810 Age-related osteoporosis without current pathological fracture: Secondary | ICD-10-CM | POA: Diagnosis present

## 2021-09-18 DIAGNOSIS — Z7901 Long term (current) use of anticoagulants: Secondary | ICD-10-CM

## 2021-09-18 DIAGNOSIS — N1831 Chronic kidney disease, stage 3a: Secondary | ICD-10-CM | POA: Diagnosis present

## 2021-09-18 DIAGNOSIS — I25810 Atherosclerosis of coronary artery bypass graft(s) without angina pectoris: Secondary | ICD-10-CM | POA: Diagnosis present

## 2021-09-18 DIAGNOSIS — J45909 Unspecified asthma, uncomplicated: Secondary | ICD-10-CM | POA: Diagnosis present

## 2021-09-18 DIAGNOSIS — I10 Essential (primary) hypertension: Secondary | ICD-10-CM

## 2021-09-18 DIAGNOSIS — K219 Gastro-esophageal reflux disease without esophagitis: Secondary | ICD-10-CM

## 2021-09-18 DIAGNOSIS — Z7902 Long term (current) use of antithrombotics/antiplatelets: Secondary | ICD-10-CM

## 2021-09-18 DIAGNOSIS — T81718A Complication of other artery following a procedure, not elsewhere classified, initial encounter: Principal | ICD-10-CM | POA: Diagnosis present

## 2021-09-18 DIAGNOSIS — F411 Generalized anxiety disorder: Secondary | ICD-10-CM | POA: Diagnosis present

## 2021-09-18 DIAGNOSIS — Z8673 Personal history of transient ischemic attack (TIA), and cerebral infarction without residual deficits: Secondary | ICD-10-CM

## 2021-09-18 DIAGNOSIS — I724 Aneurysm of artery of lower extremity: Secondary | ICD-10-CM | POA: Diagnosis present

## 2021-09-18 DIAGNOSIS — I25709 Atherosclerosis of coronary artery bypass graft(s), unspecified, with unspecified angina pectoris: Secondary | ICD-10-CM | POA: Insufficient documentation

## 2021-09-18 DIAGNOSIS — R1319 Other dysphagia: Secondary | ICD-10-CM

## 2021-09-18 DIAGNOSIS — Z6841 Body Mass Index (BMI) 40.0 and over, adult: Secondary | ICD-10-CM

## 2021-09-18 DIAGNOSIS — I25718 Atherosclerosis of autologous vein coronary artery bypass graft(s) with other forms of angina pectoris: Secondary | ICD-10-CM

## 2021-09-18 DIAGNOSIS — Z79899 Other long term (current) drug therapy: Secondary | ICD-10-CM

## 2021-09-18 DIAGNOSIS — E785 Hyperlipidemia, unspecified: Secondary | ICD-10-CM

## 2021-09-18 DIAGNOSIS — R001 Bradycardia, unspecified: Secondary | ICD-10-CM

## 2021-09-18 DIAGNOSIS — Z9889 Other specified postprocedural states: Principal | ICD-10-CM

## 2021-09-18 DIAGNOSIS — I2582 Chronic total occlusion of coronary artery: Secondary | ICD-10-CM | POA: Diagnosis present

## 2021-09-18 DIAGNOSIS — I97630 Postprocedural hematoma of a circulatory system organ or structure following a cardiac catheterization: Secondary | ICD-10-CM | POA: Diagnosis present

## 2021-09-18 DIAGNOSIS — Z951 Presence of aortocoronary bypass graft: Secondary | ICD-10-CM

## 2021-09-18 DIAGNOSIS — I440 Atrioventricular block, first degree: Secondary | ICD-10-CM

## 2021-09-18 DIAGNOSIS — Z20822 Contact with and (suspected) exposure to covid-19: Secondary | ICD-10-CM | POA: Diagnosis present

## 2021-09-18 DIAGNOSIS — Z01810 Encounter for preprocedural cardiovascular examination: Secondary | ICD-10-CM

## 2021-09-18 DIAGNOSIS — E782 Mixed hyperlipidemia: Secondary | ICD-10-CM | POA: Diagnosis present

## 2021-09-18 DIAGNOSIS — I729 Aneurysm of unspecified site: Secondary | ICD-10-CM | POA: Diagnosis not present

## 2021-09-18 DIAGNOSIS — I2510 Atherosclerotic heart disease of native coronary artery without angina pectoris: Secondary | ICD-10-CM | POA: Diagnosis present

## 2021-09-18 HISTORY — PX: PCI (PERCUTANEOUS CORONARY INTERVENTION): CATH112

## 2021-09-18 LAB — ACT LOW RANGE (POC), ~~LOC~~
ACT Low Range (POC): 304 s
ACT Low Range (POC): 270 s
ACT Low Range (POC): 303 s
ACT Low Range (POC): 373 s
ACT Low Range (POC): 378 s
ACT Low Range (POC): 164 s

## 2021-09-18 LAB — EKG 12 LEAD
Atrial Rate: 55 {beats}/min
P Axis: 61 degrees
P-R Interval: 228 ms
Q-T Interval: 420 ms
QRS Duration: 82 ms
QTC Calculation: 401 ms
R Axis: 54 degrees
T Axis: 140 degrees
Ventricular Rate: 55 {beats}/min

## 2021-09-18 LAB — BASIC METABOLIC PANEL
Anion Gap: 8 (ref 4–12)
Calcium: 9.1 mg/dL (ref 8.9–10.2)
Carbon Dioxide, Total: 25 meq/L (ref 22–32)
Chloride: 107 meq/L (ref 98–108)
Creatinine: 0.94 mg/dL (ref 0.38–1.02)
Glucose: 115 mg/dL (ref 62–125)
Potassium: 4 meq/L (ref 3.6–5.2)
Sodium: 140 meq/L (ref 135–145)
Urea Nitrogen: 18 mg/dL (ref 8–21)
eGFR by CKD-EPI 2021: >60 mL/min/{1.73_m2} (ref >59)

## 2021-09-18 LAB — CBC (HEMOGRAM)
Hematocrit: 40 % (ref 36.0–45.0)
Hemoglobin: 13.1 g/dL (ref 11.5–15.5)
MCH: 30 pg (ref 27.3–33.6)
MCHC: 32.7 g/dL (ref 32.2–36.5)
MCV: 92 fL (ref 81–98)
Platelet Count: 194 10*3/uL (ref 150–400)
RBC: 4.37 10*6/uL (ref 3.80–5.00)
RDW-CV: 12.9 % (ref 11.0–14.5)
WBC: 5.53 10*3/uL (ref 4.3–10.0)

## 2021-09-18 LAB — COVID-19 CORONAVIRUS QUALITATIVE PCR: COVID-19 Coronavirus Qual PCR Result: NOT DETECTED

## 2021-09-18 LAB — PROTHROMBIN TIME
Prothrombin INR: 1.1 (ref 0.8–1.3)
Prothrombin Time Patient: 13.7 s (ref 10.7–15.6)

## 2021-09-18 SURGERY — PCI (PERCUTANEOUS CORONARY INTERVENTION)
Anesthesia: Moderate Sedation | Site: Coronary

## 2021-09-18 MED ORDER — IPRATROPIUM BROMIDE 0.03 % NA SOLN
2.0000 | Freq: Two times a day (BID) | NASAL | Status: DC
Start: 2021-09-18 — End: 2021-09-22
  Administered 2021-09-18 – 2021-09-20 (×2): 2 via NASAL
  Filled 2021-09-18: qty 30

## 2021-09-18 MED ORDER — NITROGLYCERIN 200 MCG/ML 10 ML SYRINGE IN D5W (PROCEDURAL)
INTRAVENOUS | Status: AC
Start: 2021-09-18 — End: 2021-09-18
  Filled 2021-09-18: qty 20

## 2021-09-18 MED ORDER — SODIUM CHLORIDE 0.9% IV BOLUS
250.0000 mL | Freq: Once | INTRAVENOUS | Status: DC | PRN
Start: 2021-09-18 — End: 2021-09-22

## 2021-09-18 MED ORDER — ASPIRIN 81 MG OR CHEW
324.0000 mg | CHEWABLE_TABLET | Freq: Once | ORAL | Status: AC
Start: 2021-09-18 — End: 2021-09-18
  Administered 2021-09-18: 324 mg via ORAL

## 2021-09-18 MED ORDER — PROTAMINE SULFATE 10 MG/ML IV SOLN
INTRAVENOUS | Status: DC | PRN
Start: 2021-09-18 — End: 2021-09-18
  Administered 2021-09-18: 25 mg via INTRAVENOUS

## 2021-09-18 MED ORDER — ONDANSETRON HCL 4 MG/2ML IJ SOLN
4.0000 mg | Freq: Three times a day (TID) | INTRAMUSCULAR | Status: DC | PRN
Start: 2021-09-18 — End: 2021-09-22
  Administered 2021-09-18 – 2021-09-20 (×2): 4 mg via INTRAVENOUS
  Filled 2021-09-18 (×2): qty 2

## 2021-09-18 MED ORDER — ASPIRIN 325 MG OR TABS
ORAL_TABLET | ORAL | Status: AC
Start: 2021-09-18 — End: 2021-09-18
  Filled 2021-09-18: qty 1

## 2021-09-18 MED ORDER — CYANOCOBALAMIN 1000 MCG OR TABS
1000.0000 ug | ORAL_TABLET | Freq: Every day | ORAL | Status: DC
Start: 2021-09-18 — End: 2021-09-22
  Administered 2021-09-18 – 2021-09-22 (×5): 1000 ug via ORAL
  Filled 2021-09-18 (×5): qty 1

## 2021-09-18 MED ORDER — PROTAMINE SULFATE 10 MG/ML IV SOLN
INTRAVENOUS | Status: AC
Start: 2021-09-18 — End: 2021-09-18
  Filled 2021-09-18: qty 5

## 2021-09-18 MED ORDER — MIDAZOLAM HCL (PF) 2 MG/2ML IJ SOLN
INTRAMUSCULAR | Status: AC
Start: 2021-09-18 — End: 2021-09-18
  Filled 2021-09-18: qty 6

## 2021-09-18 MED ORDER — CLOPIDOGREL BISULFATE 75 MG OR TABS
75.0000 mg | ORAL_TABLET | Freq: Every day | ORAL | Status: DC
Start: 2021-09-18 — End: 2021-09-22
  Administered 2021-09-18 – 2021-09-22 (×5): 75 mg via ORAL
  Filled 2021-09-18 (×5): qty 1

## 2021-09-18 MED ORDER — AMLODIPINE BESYLATE 10 MG OR TABS
10.0000 mg | ORAL_TABLET | Freq: Every day | ORAL | Status: DC
Start: 2021-09-18 — End: 2021-09-22
  Administered 2021-09-18 – 2021-09-21 (×4): 10 mg via ORAL
  Filled 2021-09-18 (×4): qty 1

## 2021-09-18 MED ORDER — DIPHENHYDRAMINE HCL 50 MG/ML IJ SOLN
INTRAMUSCULAR | Status: DC | PRN
Start: 2021-09-18 — End: 2021-09-18
  Administered 2021-09-18 (×2): 12.5 mg via INTRAVENOUS

## 2021-09-18 MED ORDER — SODIUM CHLORIDE 0.9 % IV SOLN
1.5000 mL/kg/h | INTRAVENOUS | Status: DC
Start: 2021-09-18 — End: 2021-09-18
  Administered 2021-09-18: 3 mL/kg/h via INTRAVENOUS

## 2021-09-18 MED ORDER — ATROPINE SULFATE 1 MG/10ML IJ SOSY
PREFILLED_SYRINGE | INTRAMUSCULAR | Status: AC
Start: 2021-09-18 — End: 2021-09-19
  Filled 2021-09-18: qty 10

## 2021-09-18 MED ORDER — NALOXONE HCL 0.4 MG/ML IJ SOLN
0.0400 mg | INTRAMUSCULAR | Status: DC | PRN
Start: 2021-09-18 — End: 2021-09-18

## 2021-09-18 MED ORDER — LISINOPRIL 5 MG OR TABS
5.0000 mg | ORAL_TABLET | Freq: Every evening | ORAL | Status: DC
Start: 2021-09-18 — End: 2021-09-22
  Administered 2021-09-18 – 2021-09-20 (×3): 5 mg via ORAL
  Filled 2021-09-18 (×3): qty 1

## 2021-09-18 MED ORDER — METOPROLOL SUCCINATE ER 50 MG OR TB24
50.0000 mg | EXTENDED_RELEASE_TABLET | Freq: Every evening | ORAL | Status: DC
Start: 2021-09-18 — End: 2021-09-22
  Administered 2021-09-19 – 2021-09-21 (×3): 50 mg via ORAL
  Filled 2021-09-18 (×3): qty 1

## 2021-09-18 MED ORDER — BUPIVACAINE HCL (PF) 0.25 % IJ SOLN
INTRAMUSCULAR | Status: DC | PRN
Start: 2021-09-18 — End: 2021-09-18
  Administered 2021-09-18: 10 mL via SUBCUTANEOUS

## 2021-09-18 MED ORDER — FENTANYL CITRATE (PF) 100 MCG/2ML IJ SOLN
25.0000 ug | INTRAMUSCULAR | Status: DC | PRN
Start: 2021-09-18 — End: 2021-09-18

## 2021-09-18 MED ORDER — ONDANSETRON HCL 4 MG OR TABS
4.0000 mg | ORAL_TABLET | Freq: Three times a day (TID) | ORAL | Status: DC | PRN
Start: 2021-09-18 — End: 2021-09-22
  Administered 2021-09-20: 4 mg via ORAL
  Filled 2021-09-18: qty 1

## 2021-09-18 MED ORDER — ATROPINE SULFATE 1 MG/10ML IJ SOSY
0.5000 mg | PREFILLED_SYRINGE | INTRAMUSCULAR | Status: DC | PRN
Start: 2021-09-18 — End: 2021-09-22

## 2021-09-18 MED ORDER — SODIUM CHLORIDE 0.9 % IV SOLN
INTRAVENOUS | Status: AC
Start: 2021-09-18 — End: 2021-09-18
  Filled 2021-09-18: qty 50

## 2021-09-18 MED ORDER — SODIUM CHLORIDE 0.9 % IV SOLN
3.0000 mL/h | INTRAVENOUS | Status: DC
Start: 2021-09-18 — End: 2021-09-22

## 2021-09-18 MED ORDER — FLUMAZENIL 0.5 MG/5ML IV SOLN
0.2000 mg | INTRAVENOUS | Status: DC | PRN
Start: 2021-09-18 — End: 2021-09-18

## 2021-09-18 MED ORDER — ISOSORBIDE MONONITRATE ER 60 MG OR TB24
60.0000 mg | EXTENDED_RELEASE_TABLET | Freq: Every morning | ORAL | Status: DC
Start: 2021-09-19 — End: 2021-09-22
  Administered 2021-09-19 – 2021-09-22 (×4): 60 mg via ORAL
  Filled 2021-09-18 (×4): qty 1

## 2021-09-18 MED ORDER — HYDROMORPHONE HCL 2 MG/ML IJ SOLN
INTRAMUSCULAR | Status: AC
Start: 2021-09-18 — End: 2021-09-18
  Filled 2021-09-18: qty 1

## 2021-09-18 MED ORDER — IOHEXOL 300 MG/ML IJ SOLN
INTRAMUSCULAR | Status: AC
Start: 2021-09-18 — End: 2021-09-18
  Filled 2021-09-18: qty 50

## 2021-09-18 MED ORDER — BUPIVACAINE HCL (PF) 0.25 % IJ SOLN
INTRAMUSCULAR | Status: AC
Start: 2021-09-18 — End: 2021-09-18
  Filled 2021-09-18: qty 30

## 2021-09-18 MED ORDER — ACETAMINOPHEN 325 MG OR TABS
325.0000 mg | ORAL_TABLET | ORAL | Status: DC | PRN
Start: 2021-09-18 — End: 2021-09-20
  Administered 2021-09-18 – 2021-09-20 (×6): 650 mg via ORAL
  Filled 2021-09-18 (×6): qty 2

## 2021-09-18 MED ORDER — POLYETHYLENE GLYCOL 3350 17 G OR PACK
17.0000 g | PACK | Freq: Every day | ORAL | Status: DC | PRN
Start: 2021-09-18 — End: 2021-09-22

## 2021-09-18 MED ORDER — HYDROMORPHONE HCL 2 MG/ML IJ SOLN
0.2000 mg | INTRAMUSCULAR | Status: DC | PRN
Start: 2021-09-18 — End: 2021-09-18
  Administered 2021-09-18 (×4): .2 mg via INTRAVENOUS

## 2021-09-18 MED ORDER — DIPHENHYDRAMINE HCL 50 MG/ML IJ SOLN
INTRAMUSCULAR | Status: AC
Start: 2021-09-18 — End: 2021-09-18
  Filled 2021-09-18: qty 1

## 2021-09-18 MED ORDER — FENTANYL CITRATE (PF) 100 MCG/2ML IJ SOLN
12.5000 ug | INTRAMUSCULAR | Status: DC
Start: 2021-09-18 — End: 2021-09-19

## 2021-09-18 MED ORDER — HEPARIN SODIUM (PORCINE) 1000 UNIT/ML IJ SOLN
INTRAMUSCULAR | Status: AC
Start: 2021-09-18 — End: 2021-09-18
  Filled 2021-09-18: qty 10

## 2021-09-18 MED ORDER — SENNOSIDES 8.6 MG OR TABS
17.2000 mg | ORAL_TABLET | Freq: Two times a day (BID) | ORAL | Status: DC | PRN
Start: 2021-09-18 — End: 2021-09-22

## 2021-09-18 MED ORDER — IOHEXOL 350 MG/ML IV SOLN
INTRAVENOUS | Status: DC | PRN
Start: 2021-09-18 — End: 2021-09-18
  Administered 2021-09-18: 110 mL via INTRAVASCULAR

## 2021-09-18 MED ORDER — MIDAZOLAM HCL (PF) 2 MG/2ML IJ SOLN
0.5000 mg | INTRAMUSCULAR | Status: DC | PRN
Start: 2021-09-18 — End: 2021-09-18
  Administered 2021-09-18: 1.5 mg via INTRAVENOUS
  Administered 2021-09-18: .5 mg via INTRAVENOUS
  Administered 2021-09-18 (×3): 1 mg via INTRAVENOUS

## 2021-09-18 MED ORDER — NITROGLYCERIN 0.4 MG SL SUBL
0.4000 mg | SUBLINGUAL_TABLET | SUBLINGUAL | Status: DC | PRN
Start: 2021-09-18 — End: 2021-09-18

## 2021-09-18 MED ORDER — OXYCODONE HCL 5 MG OR TABS
5.0000 mg | ORAL_TABLET | ORAL | Status: DC | PRN
Start: 2021-09-18 — End: 2021-09-20
  Administered 2021-09-19 (×2): 5 mg via ORAL
  Administered 2021-09-20: 10 mg via ORAL
  Administered 2021-09-20 (×2): 5 mg via ORAL
  Filled 2021-09-18: qty 1
  Filled 2021-09-18: qty 2
  Filled 2021-09-18 (×3): qty 1

## 2021-09-18 MED ORDER — HEPARIN SODIUM (PORCINE) 1000 UNIT/ML IJ SOLN
INTRAMUSCULAR | Status: DC | PRN
Start: 2021-09-18 — End: 2021-09-18
  Administered 2021-09-18: 2000 [IU] via INTRAVENOUS
  Administered 2021-09-18: 10000 [IU] via INTRAVENOUS
  Administered 2021-09-18: 4000 [IU] via INTRAVENOUS

## 2021-09-18 SURGICAL SUPPLY — 29 items
BALLOON APEX 1.5MM 20MM 142CM PUSH (Balloon) ×2
BALLOON APEX 1.5MM 20MM 142CM PUSH MFR DISCONTINUED (Balloon) ×1 IMPLANT
BALLOON APEX 3MM 20MM 142CM (Balloon) ×2
BALLOON APEX 3MM 20MM 142CM MFR DISCONTINUED (Balloon) ×1 IMPLANT
CATHETER CORSAIR PRO XS 150CM (Catheter) ×2 IMPLANT
CATHETER FIX TRAPPER 2FR EXCHANGE (Balloon) ×2 IMPLANT
CATHETER IVUS OPTICROSS HD 6FR (Catheter) ×2 IMPLANT
DEVICE CLOSURE PERCLOSE PROSTYLE (Closure Device) ×2 IMPLANT
GUIDE CATHETER GUIDEZILLA II 6FR 150CM (Catheter) ×2 IMPLANT
GUIDE CATHETER LAUNCHER 6FR 100CM IMA (Cardiac Catheter / Guide) ×2 IMPLANT
GUIDE CATHETER LAUNCHER 8FR 100CM MP1 (Cardiac Catheter / Guide) ×2 IMPLANT
GUIDE CATHETER LAUNCHER 8FR 90CM EBU4 (Cardiac Catheter / Guide) ×2 IMPLANT
GUIDE CATHETER LAUNCHER 8FR 90CM JR4 (Cardiac Catheter / Guide) ×2 IMPLANT
GUIDE CATHETER TURNPIKE 2.9FR 135CM SPIRAL (Catheter) ×4 IMPLANT
GUIDEWIRE ASAHI GLADIUS .014IN 200CM STRT (Interventional Guidewire) ×2 IMPLANT
GUIDEWIRE ASAHI GLADIUS MONGO .014IN 190CM (Interventional Guidewire) ×4 IMPLANT
GUIDEWIRE RUNTHROUGH NS .014 X 180CM (Interventional Guidewire) ×4 IMPLANT
GUIDEWIRE UE PILOT 200 .014IN 190CM STRT (Interventional Guidewire) ×8 IMPLANT
GUIDEWIRE UE PILOT 200 .014IN 300CM STRT (Interventional Guidewire) ×2 IMPLANT
KIT DRAPE RIGHT TABLE VERSION NEW (Drape) ×2 IMPLANT
KIT MICRO INTRODUCER 5FR 10CM X 21G NEEDLE (Sheath) ×2 IMPLANT
PACK CUST LEFT HEART ANGIO (Pack) ×2 IMPLANT
SHEATH INTRODUCER CORDIS 8FR 45CM (Sheath) ×4 IMPLANT
SHEATH INTRODUCER PINNACLE  6F X 10CM .038 W/GUIDEWIRE (Sheath) ×2 IMPLANT
SHEATH INTRODUCER PINNACLE  8F X 10CM .038 W/GUIDEWIRE (Sheath) ×2 IMPLANT
SYRINGE MEDALLION 3ML FIX MALE LUER CONNECT WHITE (Syringe) ×2 IMPLANT
SYSTEM PANNUS RETENTION W/ SPADS-1 STRAP (Other) ×2 IMPLANT
VALVE HEMOSTASIS WATCHDOG KIT (Other) ×2 IMPLANT
VALVE HEMOSTAT 8FR WATCHDOG (Other) ×2 IMPLANT
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
## 2021-09-18 NOTE — Significant Event (Signed)
09/18/21 1612   RRT Event Details   Time Call Received 1612   Arrival Time 1614   Activation Rapid Response Call   3rd Tier response required? No   Clinical Trigger for RRT   Clinical Trigger for RRT Acute change in CV status   Acute change in CV status HR<55   Physician Notification    Primary covering service at bedside? Yes   Primary/Covering srvc provider name at bedside Kelby Aline   Sr Res/Fellow/Attending name on service contacted Attending MD [name]   Disposition   Transferred to Stayed on unit nursing orders received   RRT Time Completed 33   Tracking   Family Present No   Revision of rapid response call critieria No     RRT called by bedside RN when pt had bradycardic episode with HR in 30s.  Per report, pt had just had R fem sheath discontinued and hemostasis was achieved.  Upon arrival to bedside, pt was lying flat in bed and pressure was being held on R groin site.  Pt was having some pain at R groin and endorsed some heartburn symptoms that were consistent with her heartburn she gets at home.  Other VSS.  Pt's HR was back to her reported baseline of upper 40s to low 50s NSR.    Primary team arrived at bedside and said a similar event happened when the L fem site was perclosed.  No labs or additional tests were needed at this time per team.  R groin dressing taken down by team and R groin assessed by Attending MD.  New pressure dressing applied.    See Flowsheets for more details.

## 2021-09-18 NOTE — Progress Notes (Signed)
Illness Severity  stable    Patient Summary  78F with CAD s/p CABG 2006 and PCIs, moderate AS, pAF on apixaban, HTN, HL, prior CVA 06/2020, non-functioning right kidney, bipolar disorder who is now s/p attempted PCI of LAD, aborted d/t perforation with Dr. [name] on 4/10    Arrived from ICRU at 1500  - LFA perclosed, RFA 8Fr sheath sutured in, ACT 164, sheath removed, hemostasis achieved at 1600. After few bites of her lunch, pt started to feel heart burn, sharp pain on R groin, HR down to 33 with short run of slow a-fib. Called STAT team and Card I, Dr. [name] here held pressure on R groin, hemostasis achieved again at 1620. Pt's BP and HR at baseline now

## 2021-09-18 NOTE — Nursing Note (Signed)
ICRU RN Post Procedure Summary Note    Procedure Performed:  CTO     Patient Disposition: 4se       Report Given to:  [facility] RN    Discharge / Transfer Criteria:    x  VSS/within 20% of baseline  x  Pt meets goal for pain relief  x  Pt meets goal for nausea relief  x  Return to baseline respiratory status/minimal FiO2 requirements if transferred  x  Return to baseline neuro status      Procedural Access Site Assessment:    Type / Location of Procedural Access Site: Bil Fem sites C&D no signs of bleeding or hematoma noted.  + Pulses Distal to Procedural Access Site at Time of Departure from ICRU: +1      Nursing Narrative:  Pt recovered from CTO stable. Dr [name] in to see pt after procedure. Transferred to 4se on stretcher.

## 2021-09-18 NOTE — Nursing Note (Signed)
ICRU RN Pre Procedure Summary Note    Planned Procedure:  PCI    Planned disposition:    x  TBD    Pt prepped per orders and standards of care in ICRU.  See pre procedure documentation for details of pt's prep.    Pre procedure education provided verbally with pt and their family member.  All questions answered.    Nursing Narrative: Patient admitted and prepped in ICRU B-4.

## 2021-09-18 NOTE — Nursing Note (Signed)
Illness Severity  stable    Patient Summary  49F with CAD s/p CABG 2006 and PCIs, moderate AS, pAF on apixaban, HTN, HL, prior CVA 06/2020, non-functioning right kidney, bipolar disorder who is now s/p attempted PCI of LAD, aborted d/t perforation with Dr. [name] on 4/10    Arrived from ICRU at 1500  - LFA perclosed, RFA 8Fr sheath sutured in, ACT 164, sheath removed, hemostasis achieved at 1600. After few bites of her lunch, pt started to feel heart burn, sharp pain on R groin, HR down to 33 with short run of slow a-fib. Called STAT team and Card I, Dr. [name] here held pressure on R groin, hemostasis achieved again at 1620, bruising noted.   - tolerating po intake well after off bedrest, up ad lib with SBA, voiding naturally  - still SB, 40s 50s as her baseline, VSS on RA, afebrile, back pain is better after tylenol and movements    Action Items  VS and bil groin sites check  Labs in am

## 2021-09-18 NOTE — Progress Notes (Signed)
09/18/21 1615   Rapid Response (Resp Tx)   Rapid Response Interventions Provided? (Resp Tx) No   Patient moved to higher level of care?   (*RT excused)   Vital Signs   Pulse (!) 55   Heart Rate Source Monitor   Resp 18   BP (!) 133/53   MAP (mmHg) 72   BP Site Left Arm   BP Method Automatic   Patient Position Lying   Vital Sign Reason RRT(rapid response)   Additional Assessments   Position Supine   $ Pulse Ox One-time Charge Yes     Rapid response called to patients room. Pt found laying supine in bed resting on room air and time RT arrived to room. No signs of respiratory distress were noted at this time. STAT RN, primary RN and primary MD present at bedside. Per STAT RN, No RCP assistance is needed at this time.      STAT RN will call RCP if there is any changes w/ patients respiratory status.Marland Kitchen
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
## 2021-09-19 ENCOUNTER — Observation Stay (HOSPITAL_COMMUNITY): Payer: Medicare HMO

## 2021-09-19 DIAGNOSIS — I729 Aneurysm of unspecified site: Secondary | ICD-10-CM | POA: Diagnosis not present

## 2021-09-19 DIAGNOSIS — I724 Aneurysm of artery of lower extremity: Secondary | ICD-10-CM

## 2021-09-19 DIAGNOSIS — R69 Illness, unspecified: Secondary | ICD-10-CM

## 2021-09-19 DIAGNOSIS — R2241 Localized swelling, mass and lump, right lower limb: Secondary | ICD-10-CM

## 2021-09-19 HISTORY — DX: Aneurysm of unspecified site: I72.9

## 2021-09-19 LAB — CBC (HEMOGRAM)
Hematocrit: 36 % (ref 36.0–45.0)
Hematocrit: 35 % — ABNORMAL LOW (ref 36.0–45.0)
Hematocrit: 34 % — ABNORMAL LOW (ref 36.0–45.0)
Hemoglobin: 11.9 g/dL (ref 11.5–15.5)
Hemoglobin: 11.4 g/dL — ABNORMAL LOW (ref 11.5–15.5)
Hemoglobin: 11 g/dL — ABNORMAL LOW (ref 11.5–15.5)
MCH: 30.7 pg (ref 27.3–33.6)
MCH: 30.4 pg (ref 27.3–33.6)
MCH: 30.6 pg (ref 27.3–33.6)
MCHC: 33.1 g/dL (ref 32.2–36.5)
MCHC: 32.4 g/dL (ref 32.2–36.5)
MCHC: 32.6 g/dL (ref 32.2–36.5)
MCV: 93 fL (ref 81–98)
MCV: 94 fL (ref 81–98)
MCV: 94 fL (ref 81–98)
Platelet Count: 163 10*3/uL (ref 150–400)
Platelet Count: 166 10*3/uL (ref 150–400)
Platelet Count: 149 10*3/uL — ABNORMAL LOW (ref 150–400)
RBC: 3.87 10*6/uL (ref 3.80–5.00)
RBC: 3.75 10*6/uL — ABNORMAL LOW (ref 3.80–5.00)
RBC: 3.6 10*6/uL — ABNORMAL LOW (ref 3.80–5.00)
RDW-CV: 13.1 % (ref 11.0–14.5)
RDW-CV: 13 % (ref 11.0–14.5)
RDW-CV: 12.9 % (ref 11.0–14.5)
WBC: 6.37 10*3/uL (ref 4.3–10.0)
WBC: 7.49 10*3/uL (ref 4.3–10.0)
WBC: 7.1 10*3/uL (ref 4.3–10.0)

## 2021-09-19 LAB — EKG 12 LEAD
Atrial Rate: 57 {beats}/min
P Axis: 77 degrees
P-R Interval: 216 ms
Q-T Interval: 460 ms
QRS Duration: 84 ms
QTC Calculation: 447 ms
R Axis: 87 degrees
T Axis: 91 degrees
Ventricular Rate: 57 {beats}/min

## 2021-09-19 LAB — BASIC METABOLIC PANEL
Anion Gap: 5 (ref 4–12)
Calcium: 8.9 mg/dL (ref 8.9–10.2)
Carbon Dioxide, Total: 27 meq/L (ref 22–32)
Chloride: 105 meq/L (ref 98–108)
Creatinine: 1.08 mg/dL — ABNORMAL HIGH (ref 0.38–1.02)
Glucose: 115 mg/dL (ref 62–125)
Potassium: 4.2 meq/L (ref 3.6–5.2)
Sodium: 137 meq/L (ref 135–145)
Urea Nitrogen: 21 mg/dL (ref 8–21)
eGFR by CKD-EPI 2021: 54 mL/min/{1.73_m2} — ABNORMAL LOW (ref >59)

## 2021-09-19 LAB — 1ST EXTRA BLUE TOP

## 2021-09-19 MED ORDER — FENTANYL CITRATE (PF) 100 MCG/2ML IJ SOLN
25.0000 ug | INTRAMUSCULAR | Status: DC | PRN
Start: 2021-09-19 — End: 2021-09-20

## 2021-09-19 MED ORDER — LIDOCAINE 4 % EX PTCH
1.0000 | MEDICATED_PATCH | CUTANEOUS | Status: DC
Start: 2021-09-19 — End: 2021-09-22
  Administered 2021-09-19 – 2021-09-21 (×2): 1 via TRANSDERMAL
  Filled 2021-09-19 (×2): qty 1

## 2021-09-19 NOTE — Discharge Instructions (Addendum)
Ms. Caputo, you were admitted for observation overnight after attempted opening of one of your coronary arteries on 09/18/21 with Dr. Delmar Landau. You were kept in the hospital until 3/29 due to a complication at your groin access site. You were found to have a pseudoaneurysm. Vascular Surgery was able to inject a medication to help clot the bleeding and now you are stable to go home.     THINGS TO REMEMBER:  Continue daily Plavix 75 mg.   You may restart your apixaban tonight 5 mg every 12 hours.  We stopped one of your blood pressure medications; amlodipine because your blood pressures were on the low side. Please follow home blood pressures and write them down for review by your primary doctor. Your primary doctor may need to restart these medications.   The large bruise in your right groin may get worse by migrating down your leg. This is expected. We would also expect the pain to improve over time. Take tylenol as needed.     Follow up with the [place] structural heart team in clinic in approximately 2 weeks to discuss repeat intervention of the coronary artery. We will call you to schedule these appointments.    Please contact your primary cardiologist's office to coordinate a visit within two weeks of your hospital discharge.     Wound care  You may shower today when you get home. No scrubbing at site; let soapy water run over incision and pat dry with clean towel. Avoid soaking in pool or tub for 7 days.  Do not leave a wet dressing on because that will put you at a higher risk of infection.   Keep the area clean and dry. You do not need to recover it. Do not apply any type of creams and/or ointments to the site.   Contact your cardiologist if you develop painful swelling, firmness, or bleeding at procedure incision sites. We know you have firmness at your right access site. This should improve over time but expect bruising to migrate down your leg.     Activity  We did not schedule cardiac rehab on discharge as  you will still need to visit Dr. [name] to address your blocked artery.   No heavy lifting, no pulling, no strenuous activity. No lifting more than 10s lbs for 7 days.   Avoid straining with bowel movements for the next week.   Gradually resume your normal activities over the next 2-5 days. Gentle walking is ok.   No driving for 48 hours.  If you should cough, laugh, sneeze and or strain in anyway place the flats of your 3 middle fingers over the site and apply moderate pressure.      It was a pleasure caring for you,  [facility] Interventional Cardiology Team

## 2021-09-19 NOTE — Progress Notes (Signed)
Progress Note     Wendy [name] [facility]") - DOB: 01-Sep-1945 76 year old female)  Gender Identity: Female  Preferred Pronouns: she/her/hers  Admit Date: 09/18/2021  Code Status: Full Code       CHIEF CONCERN / IDENTIFICATION:     Wendy [name] is a 76 year old female with CAD s/p CABG 2006 (LIMA-LAD, SVG-RCA, SVG-RI-D1 [occluded], SVG-D3), mild-to-moderate AS, pAF on apixaban, HTN, HL, prior CVA 06/2020, non-functioning right kidney, bipolar disorder with recent cardiac SPECT MRI showing large area of ischemia s/p unsuccessful RCA CTO PCI and successful SVG-PDA PCI Wendy [name] 07/17/21). Now s/p unsuccessful first diagonal CTO PCI Wendy [name] 09/18/21).      SUBJECTIVE   INTERVAL HISTORY:  Overnight  - ~1600 rapid response was called for heart rate in the 30's after right groin sheath was removed and hemostasis acheived. Bradycardia resolved quickly without any other hemodynamic changes. Pressure was applied to groin by Wendy [name].    Today  - patient found to have moderate-large right groin hematoma that increased in size after ambulation. Vascular US revealed right groin pseudoaneurysm measuring 2.93 x 1.53 with large hematoma surrounding the pseudoaneurysm.   - s/p ultrasound-guided compression.   - Vascular Surgery was consulted and recommend repeat vascular US tomorrow.      SCHEDULED MEDICATIONS:   .  amLODIPine, 10 mg, Daily  .  clopidogrel, 75 mg, Daily  .  cyanocobalamin, 1,000 mcg, Daily  .  ipratropium, 2 spray, BID  .  isosorbide mononitrate ER, 60 mg, q AM  .  lisinopril, 5 mg, q PM  .  metoprolol succinate ER, 50 mg, q HS    INFUSED MEDICATIONS:  .  sodium chloride, 3 mL/hr, Continuous, Last Rate: Stopped (09/18/21 1627)     PRN MEDICATIONS:  .  acetaminophen, 325-650 mg, q4h PRN  .  atropine, 0.5 mg, PRN  .  fentaNYL PF, 25-50 mcg, q5 min PRN  .  ondansetron, 4 mg, q8h PRN  .  ondansetron, 4 mg, q8h PRN  .  oxyCODONE, 5-10 mg, q4h PRN  .  polyethylene glycol 3350, 17 g, Daily PRN  .  senna, 17.2 mg,  BID PRN  .  sodium chloride, 250 mL, Once PRN       OBJECTIVE     Vitals (Most recent in last 24 hrs)     T: 36.6 C (09/19/21 1327)  BP: (!) 127/42 (09/19/21 1327)  HR: 63 (09/19/21 0726)  RR: 16 (09/19/21 1327)  SpO2: 95 % (09/19/21 1327) Room air  T range: Temp  Min: 36.1 C  Max: 36.6 C  Admit weight: (!) 100 kg (220 lb 7.4 oz) (09/18/21 0820)  Last weight: (!) 100 kg (220 lb 7.4 oz) (09/18/21 0820)       I&Os:     Intake/Output Summary (Last 24 hours) at 09/19/2021 1642  Last data filed at 09/19/2021 0800  Intake --   Output 750 ml   Net -750 ml       Physical Exam  CONSTITUTIONAL: pleasant, conversational woman, lying in bed, NAD.    HENT: NCAT  EYES: sclera anicteric   CARDIOVASCULAR (all elements in CV exam must be included)            NECK VEINS: no JVD.             PMI:not assessed            RHYTHM: bradycardic, RR.  HEART TONES: 3/6 holosystolic murmur heard throughout the precordium.             PULSES: 2+ bilateral radials, DP's dopplerable, PT's 1-2+.            EDEMA: no LE edema.   RESP/LUNGS: Lungs CTA bilaterally.  ABDOMEN: large, soft, non-distended, non-tender.   MUSCULOSKELETAL: no gross deformities  SKIN: warm and dry.   NEURO: alert and oriented, grossly non-focal.   PSYCH: normal mood and affect.   Access sites: right groin is firm with mod-large hematoma, large amount of bruising, no oozing.   left groin access tegaderm dressing CDI soft, without hematoma, bruising or oozing    Labs (last 24 hours):   Chemistries  CBC  LFT  Gases, other   137 105 21 115   11.4   AST: - ALT: -  -/-/-/-  -/-/-/-   4.2 27 1.08   7.49 >< 166  AP: - T bili: -  Lact (a): - Lact (v): -   eGFR: 54 Ca: 8.9   35   Prot: - Alb: -  Trop I: - D-dimer: -   Mg: - PO4: -  ANC: -     BNP: - Anti-Xa: -     ALC: -    INR: -           Reviewed Results? Independently visualized & interpreted? Key Findings     Lab _0  _1     Radiology _2  _3     EKG/Tele/Echo  _4  _5     Other?  _6  _7             ASSESSMENT/PLAN       Wendy [name] is a 76 yo female admitted s/p unsuccessful diagonal CTO PCI complicated by small perforation and pseudoaneurysm (09/18/21) with Wendy [name].      #CAD s/p PCI  #HLD  s/p CABG 2006 (LIMA-lAD, SVG-RCA, SVG-RI-D1, SVG-D3)  S/p unsuccessful diagonal CTO PCI (4/10). Procedure was complicated by small perforation requiring protamine and balloon tamponade.  Received loading dose of aspirin 324 mg pre-procedure.   Bifemoral access: LFA #8 perclosed ,RFA #30F sheath left in place for a few hours given perforation then removed on the floor with transient bradycardia to the 30's. ECG post-episode stable showing sinus bradycardia without ST elevation or depression.    -- metoprolol succinate 23m at bedtime  -- isosorbide mononitrate 633mdaily  -- AM labs and EKG ordered for 4/12  -- Plavix 75 mg daily  -- holding Eliquis for minimum 72 hours and likely will hold longer given pseudoaneurysm.   -- Activity and vitals monitoring per orders  -- No driving for 4844WNost procedure  -- Ok to shower and remove dressing 24-48hr post-procedure  -- 10 lb weight limit for one week for femoral access  -- No soaking in tub or pool for at least 2wk post procedure  -- Follow up with primary cardiologist 2-4 weeks  -- Plan moving forward is follow up with Wendy [name] discuss re-attempt ([facility]      EVIDENCE BASED MEDICATION FOR SECONDARY PREVENTION:   DAPT prescribed? No   Statin prescribed? No       aspirin not indicated: avoidance of triple therapy       statin not indicated: allergy, hypertension    # Iatrogenic Pseudoaneurysm  Patient found to have moderate-large right groin hematoma that increased in size after ambulation. Vascular USKoreaevealed right groin pseudoaneurysm measuring  2.93 x 1.53 with large hematoma surrounding the pseudoaneurysm. S/p ultrasound-guided compression by our Cardiology Fellow, Wendy. Jonette Mate.  Vascular Surgery was consulted and  recommend repeat vascular US tomorrow.   - strict bedrest overnight.   - manual decompression of hematoma PRN.  - serial CBC's.   - repeat vascular US tomorrow.       #pAF    On apixaban. CHADsVASc.   - holding Eliquis 42m BID.   - metoprolol succinate 539mdaily      #HTN  -amlodipine 1033mnd lisinopril 5mg71m    Inpatient Checklist:    Fluids/Electrolytes/Nutrition: Healthy Heart diet  Prophylaxis: SCD's  Lines/Drains/Airways: PIV, right femoral sheath  Disposition: ICRU   Code Status: Full Code    Contacts: Primary Emergency Contact: MYERS,RUHIYYIH ("[place]"), Home Phone: [phone]   I spent a total of 15 minutes for the patient's care on the date of the service.

## 2021-09-19 NOTE — Nursing Note (Signed)
Illness Severity  stable      Patient Summary  15F with CAD s/p CABG 2006 and PCIs, moderate AS, pAF on apixaban, HTN, HL, prior CVA 06/2020, non-functioning right kidney, bipolar disorder who is now s/p attempted PCI of LAD, aborted d/t perforation with Dr. [name] on 4/10    - Aox4, confused and forgetful at times.   VSS on RA.   - SR but can be SB at night.   - Rt femoral site large hematoma. US showed pseudoaneurysm and I held pressure for 15 min. Then the MD came held US guided pressure. PT then got up to bathroom and she started to bleed again. Held pressure again and instructed pt   to stay on bedrest for the evening. Hematoma stable. MD aware and NP came to check site. ICE pack placed.  - Lt femoral site: slight bruise but no hematoma. Dressing w/ tegardem is c/d/I.  Both pulses palpable.  - Oxy and tylenol for back pain.                Action Items  - CTM VS Q 4, Bifemoral sites, I/O, labs.       Situational Awareness
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
## 2021-09-19 NOTE — Nursing Note (Signed)
Illness Severity  stable      Patient Summary  2F with CAD s/p CABG 2006 and PCIs, moderate AS, pAF on apixaban, HTN, HL, prior CVA 06/2020, non-functioning right kidney, bipolar disorder who is now s/p attempted PCI of LAD, aborted d/t perforation with Dr. [name] on 4/10    2300-0700  - Aox4, confused and forgetful. Insomnia; slept on/off.  VSS on 2L NC. SPO2 dropped to 89% on RA at 4am.  - SB in 40s-50s on tele.   - Rt femoral site: about 20 x 20 cm dark purple raised bruise under the puncture site. Notified to MD and applied pressure 20 min at 4am.  Dressing w/ tegarderm c/d/I w/o drainage mark. Doppler DP pulse and 1+ TP on Rt foot. Prn APAP   x 1 given for 5/10 pain w/ little relief.   - Lt femoral site: No bruise or hematoma. Dressing w/ tegardem is c/d/I.  Doppler DP & 1+ TP on left foot. Denied pain.     Action Items  - CTM VS Q 4, Bifemoral sites, I/O, labs.

## 2021-09-20 ENCOUNTER — Ambulatory Visit (HOSPITAL_BASED_OUTPATIENT_CLINIC_OR_DEPARTMENT_OTHER): Payer: Medicare HMO | Admitting: Clinical

## 2021-09-20 ENCOUNTER — Other Ambulatory Visit (HOSPITAL_COMMUNITY): Payer: Medicare HMO

## 2021-09-20 ENCOUNTER — Inpatient Hospital Stay (HOSPITAL_COMMUNITY): Payer: Medicare HMO

## 2021-09-20 ENCOUNTER — Observation Stay (HOSPITAL_COMMUNITY): Payer: Medicare HMO

## 2021-09-20 DIAGNOSIS — I491 Atrial premature depolarization: Secondary | ICD-10-CM

## 2021-09-20 DIAGNOSIS — R2241 Localized swelling, mass and lump, right lower limb: Secondary | ICD-10-CM

## 2021-09-20 DIAGNOSIS — R001 Bradycardia, unspecified: Secondary | ICD-10-CM

## 2021-09-20 DIAGNOSIS — I724 Aneurysm of artery of lower extremity: Secondary | ICD-10-CM

## 2021-09-20 DIAGNOSIS — R9431 Abnormal electrocardiogram [ECG] [EKG]: Secondary | ICD-10-CM

## 2021-09-20 LAB — EKG 12 LEAD
Atrial Rate: 51 {beats}/min
Atrial Rate: 57 {beats}/min
P Axis: 65 degrees
P Axis: 78 degrees
P-R Interval: 226 ms
P-R Interval: 234 ms
Q-T Interval: 432 ms
Q-T Interval: 418 ms
QRS Duration: 88 ms
QRS Duration: 90 ms
QTC Calculation: 398 ms
QTC Calculation: 406 ms
R Axis: 69 degrees
R Axis: 82 degrees
T Axis: 139 degrees
T Axis: 106 degrees
Ventricular Rate: 51 {beats}/min
Ventricular Rate: 57 {beats}/min

## 2021-09-20 LAB — HEMOGLOBIN AND HEMATOCRIT
Hematocrit: 33 % — ABNORMAL LOW (ref 36.0–45.0)
Hematocrit: 31 % — ABNORMAL LOW (ref 36.0–45.0)
Hemoglobin: 10.9 g/dL — ABNORMAL LOW (ref 11.5–15.5)
Hemoglobin: 10 g/dL — ABNORMAL LOW (ref 11.5–15.5)

## 2021-09-20 LAB — CBC (HEMOGRAM)
Hematocrit: 35 % — ABNORMAL LOW (ref 36.0–45.0)
Hemoglobin: 11.3 g/dL — ABNORMAL LOW (ref 11.5–15.5)
MCH: 30.5 pg (ref 27.3–33.6)
MCHC: 32.4 g/dL (ref 32.2–36.5)
MCV: 94 fL (ref 81–98)
Platelet Count: 159 10*3/uL (ref 150–400)
RBC: 3.71 10*6/uL — ABNORMAL LOW (ref 3.80–5.00)
RDW-CV: 13.1 % (ref 11.0–14.5)
WBC: 7.09 10*3/uL (ref 4.3–10.0)

## 2021-09-20 LAB — BASIC METABOLIC PANEL
Anion Gap: 6 (ref 4–12)
Calcium: 9 mg/dL (ref 8.9–10.2)
Carbon Dioxide, Total: 27 meq/L (ref 22–32)
Chloride: 103 meq/L (ref 98–108)
Creatinine: 1.07 mg/dL — ABNORMAL HIGH (ref 0.38–1.02)
Glucose: 95 mg/dL (ref 62–125)
Potassium: 4.1 meq/L (ref 3.6–5.2)
Sodium: 136 meq/L (ref 135–145)
Urea Nitrogen: 19 mg/dL (ref 8–21)
eGFR by CKD-EPI 2021: 55 mL/min/{1.73_m2} — ABNORMAL LOW (ref >59)

## 2021-09-20 LAB — COVID-19 CORONAVIRUS QUALITATIVE PCR: COVID-19 Coronavirus Qual PCR Result: NOT DETECTED

## 2021-09-20 LAB — PROTHROMBIN & PTT
Partial Thromboplastin Time: 29 s (ref 22–35)
Prothrombin INR: 1.1 (ref 0.8–1.3)
Prothrombin Time Patient: 13.9 s (ref 10.7–15.6)

## 2021-09-20 MED ORDER — OXYCODONE HCL 5 MG OR TABS
5.0000 mg | ORAL_TABLET | ORAL | Status: DC | PRN
Start: 2021-09-20 — End: 2021-09-22
  Administered 2021-09-21: 5 mg via ORAL
  Filled 2021-09-20: qty 1

## 2021-09-20 MED ORDER — METHOCARBAMOL 750 MG OR TABS
750.0000 mg | ORAL_TABLET | Freq: Four times a day (QID) | ORAL | Status: DC | PRN
Start: 2021-09-20 — End: 2021-09-22
  Administered 2021-09-20: 750 mg via ORAL
  Filled 2021-09-20 (×2): qty 1

## 2021-09-20 MED ORDER — LACTATED RINGERS IV SOLN
75.0000 mL/h | INTRAVENOUS | Status: AC
Start: 2021-09-20 — End: 2021-09-21
  Administered 2021-09-20: 75 mL/h via INTRAVENOUS

## 2021-09-20 MED ORDER — IOHEXOL 350 MG/ML IV SOLN
100.0000 mL | Freq: Once | INTRAVENOUS | Status: AC | PRN
Start: 2021-09-20 — End: 2021-09-20
  Administered 2021-09-20: 100 mL via INTRAVENOUS

## 2021-09-20 MED ORDER — METOCLOPRAMIDE HCL 5 MG/ML IJ SOLN
5.0000 mg | Freq: Four times a day (QID) | INTRAMUSCULAR | Status: DC | PRN
Start: 2021-09-20 — End: 2021-09-22

## 2021-09-20 MED ORDER — ACETAMINOPHEN 500 MG OR TABS
1000.0000 mg | ORAL_TABLET | Freq: Four times a day (QID) | ORAL | Status: DC
Start: 2021-09-20 — End: 2021-09-22
  Administered 2021-09-20 – 2021-09-22 (×6): 1000 mg via ORAL
  Filled 2021-09-20 (×6): qty 2

## 2021-09-20 MED ORDER — SCOPOLAMINE 1 MG/3DAYS TD PT72
1.0000 | MEDICATED_PATCH | TRANSDERMAL | Status: DC
Start: 2021-09-20 — End: 2021-09-22
  Administered 2021-09-20: 1 via TRANSDERMAL
  Filled 2021-09-20: qty 1

## 2021-09-20 NOTE — Nursing Note (Signed)
Illness Severity  stable      Patient Summary  45F with CAD s/p CABG 2006 and PCIs, moderate AS, pAF on apixaban, HTN, HL, prior CVA 06/2020, non-functioning right kidney, bipolar disorder who is now s/p attempted PCI of LAD, aborted d/t perforation with Dr. [name] on 4/10    - Aox4, VSS on RA. NSR/SB w/ 1AVB  - R groin hematoma. Bled again today when pt got up to bathroom. Held pressure for 30 minutes and then US done which showed a small bleed. Vasc ordered to lay flat with pressure dressing. Then they decided to inject thrombin tomorrow   morning.   - Pt complaining of back pain - Ct to r/o RP bleed, which was negative. Added robaxin to the tylenol and oxy for chronic back pain.  - CT also showed kidney stone with hydronephrosis but no new orders or plans to address that.   - L groin covered w/ gauze and tegaderm, CDI. No hematoma, pulses palpable.  - Foley catheter because patient was unable to urinate lying flat.       Action Items  - Monitor vitals, tele, groin sites  - Vascular duplex to RLE       Situational Awareness  - U/S of R groin shows pseudoaneurysm surrounded by hematoma

## 2021-09-20 NOTE — Progress Notes (Signed)
Progress Note     Dalynn Jhaveri [facility]") - DOB: [birth year] 76 year old female)  Gender Identity: Female  Preferred Pronouns: she/her/hers  Admit Date: 09/18/2021  Code Status: Full Code       CHIEF CONCERN / IDENTIFICATION:     Wendy [name] is a 76 year old female with CAD s/p CABG 2006 (LIMA-LAD, SVG-RCA, SVG-RI-D1 [occluded], SVG-D3), mild-to-moderate AS, pAF on apixaban, HTN, HL, prior CVA 06/2020, non-functioning right kidney, bipolar disorder with recent cardiac SPECT MRI showing large area of ischemia s/p unsuccessful RCA CTO PCI and successful SVG-PDA PCI [name] 07/17/21). Now s/p unsuccessful first diagonal CTO PCI [name] 9/67/89), complicated by right femoral hematoma and pseudoanerysm.      SUBJECTIVE   INTERVAL HISTORY:  Overnight  - straight cath due to retention    Today  - patient with enlarging hematoma after ambulation in this am. Pressure held for 30 mins.  - H&H stable  - CTA abdomen pelvis to rule out retroperitoneal bleed  - Zetha reports intermittent nausea and continuous back pian with bedrest.  - daughter Serene called and updated       SCHEDULED MEDICATIONS:   .  amLODIPine, 10 mg, Daily  .  clopidogrel, 75 mg, Daily  .  cyanocobalamin, 1,000 mcg, Daily  .  ipratropium, 2 spray, BID  .  isosorbide mononitrate ER, 60 mg, q AM  .  lidocaine, 1 patch, q24h  .  lidocaine, 1 patch, q24h  .  lisinopril, 5 mg, q PM  .  metoprolol succinate ER, 50 mg, q HS  .  scopolamine, 1 patch, q72h    INFUSED MEDICATIONS:  .  sodium chloride, 3 mL/hr, Continuous, Last Rate: Stopped (09/18/21 1627)     PRN MEDICATIONS:  .  acetaminophen, 325-650 mg, q4h PRN  .  atropine, 0.5 mg, PRN  .  methocarbamol, 750 mg, q6h PRN  .  metoclopramide, 5 mg, q6h PRN  .  ondansetron, 4 mg, q8h PRN  .  ondansetron, 4 mg, q8h PRN  .  oxyCODONE, 5-10 mg, q4h PRN  .  polyethylene glycol 3350, 17 g, Daily PRN  .  senna, 17.2 mg, BID PRN  .  sodium chloride, 250 mL, Once PRN       OBJECTIVE     Vitals (Most recent in last  24 hrs)     T: 36.2 C (09/20/21 1304)  BP: (!) 135/43 (09/20/21 1304)  HR: (!) 56 (09/20/21 0600)  RR: 16 (09/20/21 1304)  SpO2: 93 % (09/20/21 1304) Nasal cannula 2 L/min    T range: Temp  Min: 35.8 C  Max: 36.7 C  Admit weight: (!) 100 kg (220 lb 7.4 oz) (09/18/21 0820)  Last weight: (!) 100 kg (220 lb 7.4 oz) (09/18/21 0820)       I&Os:     Intake/Output Summary (Last 24 hours) at 09/20/2021 1633  Last data filed at 09/20/2021 0645  Intake --   Output 650 ml   Net -650 ml       Physical Exam  CONSTITUTIONAL: pleasant, conversational woman, lying in bed, NAD.    HENT: NCAT  EYES: sclera anicteric   CARDIOVASCULAR            NECK VEINS: no JVD.             PMI:not assessed            RHYTHM: RR.             HEART TONES:  3/6 holosystolic murmur heard throughout the precordium.             PULSES: 2+ bilateral radials, DP's dopplerable, PT's 1-2+.            EDEMA: no LE edema.   RESP/LUNGS: Lungs CTA bilaterally.  ABDOMEN: large, soft, non-distended, non-tender, no bruising  MUSCULOSKELETAL: no gross deformities  SKIN: warm and dry.   NEURO: alert and oriented, grossly non-focal.   PSYCH: normal mood and affect.   Access sites: right groin is firm with mod-large hematoma, large amount of bruising with ~2cm extension from prior marking, no oozing.   left groin access tegaderm dressing CDI soft, without hematoma, bruising or oozing    Labs (last 24 hours):   Chemistries  CBC  LFT  Gases, other   136 103 19 95   10.9   AST: - ALT: -  -/-/-/-  -/-/-/-   4.1 27 1.07   7.09 >< 159  AP: - T bili: -  Lact (a): - Lact (v): -   eGFR: 55 Ca: 9.0   33   Prot: - Alb: -  Trop I: - D-dimer: -   Mg: - PO4: -  ANC: -     BNP: - Anti-Xa: -     ALC: -    INR: 1.1           Reviewed Results? Independently visualized & interpreted? Key Findings     Lab [x]  [x]     Radiology []  []     EKG/Tele/Echo  [x]  [x]     Other?  []  []             ASSESSMENT/PLAN      Wendy [name] is a 76 yo female admitted s/p unsuccessful diagonal CTO PCI  complicated by small perforation and pseudoaneurysm (09/18/21) with Dr Marisa Sprinkles.      #CAD s/p PCI  #HLD  s/p CABG 2006 (LIMA-lAD, SVG-RCA, SVG-RI-D1, SVG-D3)  S/p unsuccessful diagonal CTO PCI (4/10). Procedure was complicated by small perforation requiring protamine and balloon tamponade.  Received loading dose of aspirin 324 mg pre-procedure.   Bifemoral access: LFA #59F perclosed, RFA #59F sheath left in place for a few hours given perforation then removed on the floor with transient bradycardia to the 30's. ECG post-episode stable showing sinus bradycardia without ST elevation or depression.    -- metoprolol succinate 32m at bedtime  -- isosorbide mononitrate 67mdaily  -- AM labs and EKG ordered for 4/12  -- Plavix 75 mg daily  -- holding Eliquis for minimum 72 hours and likely will hold longer given pseudoaneurysm.   -- Activity and vitals monitoring per orders  -- No driving for 4867ELost procedure  -- Ok to shower and remove dressing 24-48hr post-procedure  -- 10 lb weight limit for one week for femoral access  -- No soaking in tub or pool for at least 2wk post procedure  -- Follow up with primary cardiologist 2-4 weeks  -- follow up with Dr. KeAlwyn Reno discuss re-attempt ([facility]      EVIDENCE BASED MEDICATION FOR SECONDARY PREVENTION:   DAPT prescribed? No, prevent triple therapy   Statin prescribed? No       aspirin not indicated: avoidance of triple therapy       statin not indicated: allergy, hypertension    #Iatrogenic Pseudoaneurysm  Patient found to have moderate-large right groin hematoma that increased in size after ambulation. Vascular USKoreaevealed right  groin pseudoaneurysm measuring 2.93 x 1.53 with large hematoma surrounding the pseudoaneurysm. S/p ultrasound-guided compression by our Cardiology Fellow, Dr. Jonette Mate.  Vascular Surgery was consulted and recommend repeat vascular US tomorrow.   - strict bedrest    - manual  decompression of hematoma PRN.  - serial CBC's.   - repeat vascular US 4/12  - CTA abdomen pelvis today to rule out retroperitoneal bleed       #pAF    On apixaban. CHADsVASc .   - holding Eliquis 59m BID.   - metoprolol succinate 556mdaily      #HTN  - amlodipine 1020maily  - lisinopril 5mg55mily.    #Back pain  Chronic lower back, not on medications at home.   - Oxycodone PRN  - Robaxin PRN      Inpatient Checklist:    Fluids/Electrolytes/Nutrition: Healthy Heart diet  Prophylaxis: SCD's  Lines/Drains/Airways: PIV, right femoral sheath  Disposition: ICRU   Code Status: Full Code    Contacts: Primary Emergency Contact: MYERS,RUHIYYIH ("Serene"), Home Phone: [phone] spent a total of 30 minutes for the patient's care on the date of the service.

## 2021-09-20 NOTE — Progress Notes (Signed)
Progress Note     Faithann Natal [facility]") - DOB: 1945/11/18 [age] year old female)  Gender Identity: Female  Preferred Pronouns: she/her/hers  Admit Date: 09/18/2021  Code Status: Full Code       CHIEF CONCERN / IDENTIFICATION:     Alyze Lauf is a 76 year old female seen in consultation at the request of Dr. [name] for consultation regarding R femoral pseudoaneurysm.     SUBJECTIVE     Surgical/Procedural Cases on this Admission     Case IDs Date Procedure Surgeon Location Status    [phone] 09/18/21 PCI (Percutaneous Coronary Intervention) Serena Colonel, MD [facility] CATH LAB Comp          INTERVAL HISTORY:  - straight cath overnight    SCHEDULED MEDICATIONS:   .  amLODIPine, 10 mg, Daily  .  clopidogrel, 75 mg, Daily  .  cyanocobalamin, 1,000 mcg, Daily  .  ipratropium, 2 spray, BID  .  isosorbide mononitrate ER, 60 mg, q AM  .  lidocaine, 1 patch, q24h  .  lidocaine, 1 patch, q24h  .  lisinopril, 5 mg, q PM  .  metoprolol succinate ER, 50 mg, q HS    INFUSED MEDICATIONS:  .  sodium chloride, 3 mL/hr, Continuous, Last Rate: Stopped (09/18/21 1627)     PRN MEDICATIONS:  .  acetaminophen, 325-650 mg, q4h PRN  .  atropine, 0.5 mg, PRN  .  fentaNYL PF, 25-50 mcg, q5 min PRN  .  ondansetron, 4 mg, q8h PRN  .  ondansetron, 4 mg, q8h PRN  .  oxyCODONE, 5-10 mg, q4h PRN  .  polyethylene glycol 3350, 17 g, Daily PRN  .  senna, 17.2 mg, BID PRN  .  sodium chloride, 250 mL, Once PRN       OBJECTIVE     Vitals (Most recent in last 24 hrs)     T: 36.6 C (09/20/21 0600)  BP: (!) 138/54 (09/20/21 0600)  HR: (!) 56 (09/20/21 0600)  RR: 18 (09/20/21 0600)  SpO2: 96 % (09/20/21 0650) Room air  T range: Temp  Min: 36.3 C  Max: 36.7 C  Admit weight: (!) 100 kg (220 lb 7.4 oz) (09/18/21 0820)  Last weight: (!) 100 kg (220 lb 7.4 oz) (09/18/21 0820)       I&Os:     Intake/Output Summary (Last 24 hours) at 09/20/2021 0743  Last data filed at 09/20/2021 0645  Intake --   Output 900 ml   Net -900 ml       Physical  Exam  Gen: laying in bed flat, NAD  CV: extremities warm  Resp: unlabored breathing  Ext: R groin with significant bruising, stable and unchanged from yesterday    Labs (last 24 hours):   Chemistries  CBC  LFT  Gases, other   - - - -   11.0   AST: - ALT: -  -/-/-/-  -/-/-/-   - - -   7.10 >< 149  AP: - T bili: -  Lact (a): - Lact (v): -   eGFR: - Ca: -   34   Prot: - Alb: -  Trop I: - D-dimer: -   Mg: - PO4: -  ANC: -     BNP: - Anti-Xa: -     ALC: -    INR: -             ASSESSMENT/PLAN      Tomasina Keasling is  a 75 year old female seen in consultation at the request of Dr. Kearney for consultation regarding R femoral pseudoaneurysm.    Recommendations:  - Bed rest with R leg flat at all times and HOB < 30 degrees   - Repeat RLE arterial duplex today

## 2021-09-20 NOTE — Nursing Note (Signed)
Illness Severity  stable      Patient Summary  36F with CAD s/p CABG 2006 and PCIs, moderate AS, pAF on apixaban, HTN, HL, prior CVA 06/2020, non-functioning right kidney, bipolar disorder who is now s/p attempted PCI of LAD, aborted d/t perforation with Dr. [name] on 4/10    - Aox4, VSS on RA. NSR/SB w/ 1AVB  - R groin w/ bruising, outlined. Stable hematoma, unchanged overnight. CMS intact. On strict bed rest w/ HOB<30.  - L groin covered w/ gauze and tegaderm, CDI. No hematoma, pulses palpable.  - Lower back pain managed w/ PRN oxy, tylenol, lidocaine patches  - Patient unable to void while laying flat, bladder scan 581. Provider notified, straight cath performed. 665m out

## 2021-09-21 ENCOUNTER — Inpatient Hospital Stay (HOSPITAL_COMMUNITY): Payer: Medicare HMO

## 2021-09-21 DIAGNOSIS — R2241 Localized swelling, mass and lump, right lower limb: Secondary | ICD-10-CM

## 2021-09-21 DIAGNOSIS — I724 Aneurysm of artery of lower extremity: Secondary | ICD-10-CM

## 2021-09-21 LAB — EKG 12 LEAD
Atrial Rate: 70 {beats}/min
P Axis: 69 degrees
P-R Interval: 202 ms
Q-T Interval: 418 ms
QRS Duration: 84 ms
QTC Calculation: 451 ms
R Axis: 62 degrees
T Axis: 111 degrees
Ventricular Rate: 70 {beats}/min

## 2021-09-21 LAB — BASIC METABOLIC PANEL
Anion Gap: 10 (ref 4–12)
Calcium: 8.6 mg/dL — ABNORMAL LOW (ref 8.9–10.2)
Carbon Dioxide, Total: 23 meq/L (ref 22–32)
Chloride: 103 meq/L (ref 98–108)
Creatinine: 1.1 mg/dL — ABNORMAL HIGH (ref 0.38–1.02)
Glucose: 101 mg/dL (ref 62–125)
Potassium: 4.2 meq/L (ref 3.6–5.2)
Sodium: 136 meq/L (ref 135–145)
Urea Nitrogen: 25 mg/dL — ABNORMAL HIGH (ref 8–21)
eGFR by CKD-EPI 2021: 53 mL/min/{1.73_m2} — ABNORMAL LOW (ref >59)

## 2021-09-21 LAB — CBC (HEMOGRAM)
Hematocrit: 29 % — ABNORMAL LOW (ref 36.0–45.0)
Hemoglobin: 9.2 g/dL — ABNORMAL LOW (ref 11.5–15.5)
MCH: 29.9 pg (ref 27.3–33.6)
MCHC: 31.8 g/dL — ABNORMAL LOW (ref 32.2–36.5)
MCV: 94 fL (ref 81–98)
Platelet Count: 143 10*3/uL — ABNORMAL LOW (ref 150–400)
RBC: 3.08 10*6/uL — ABNORMAL LOW (ref 3.80–5.00)
RDW-CV: 13.1 % (ref 11.0–14.5)
WBC: 7.96 10*3/uL (ref 4.3–10.0)

## 2021-09-21 LAB — HEMOGLOBIN AND HEMATOCRIT
Hematocrit: 29 % — ABNORMAL LOW (ref 36.0–45.0)
Hematocrit: 29 % — ABNORMAL LOW (ref 36.0–45.0)
Hemoglobin: 9.3 g/dL — ABNORMAL LOW (ref 11.5–15.5)
Hemoglobin: 9.7 g/dL — ABNORMAL LOW (ref 11.5–15.5)

## 2021-09-21 LAB — ACT LOW RANGE (POC), ~~LOC~~: ACT Low Range (POC): 212 s

## 2021-09-21 MED ORDER — LIDOCAINE-EPINEPHRINE 1 %-1:100000 IJ SOLN
30.0000 mL | Freq: Once | INTRAMUSCULAR | Status: AC
Start: 2021-09-21 — End: 2021-09-21

## 2021-09-21 MED ORDER — LACTATED RINGERS BOLUS
500.0000 mL | Freq: Once | INTRAVENOUS | Status: AC
Start: 2021-09-21 — End: 2021-09-21
  Administered 2021-09-21: 500 mL via INTRAVENOUS

## 2021-09-21 MED ORDER — THROMBIN (RECOMBINANT) 5000 UNITS EX SOLR
5000.0000 [IU] | Freq: Once | CUTANEOUS | Status: AC
Start: 2021-09-21 — End: 2021-09-21
  Administered 2021-09-21: 5000 [IU] via TOPICAL
  Filled 2021-09-21: qty 5000

## 2021-09-21 MED ORDER — MELATONIN 3 MG OR TABS
6.0000 mg | ORAL_TABLET | Freq: Every evening | ORAL | Status: DC | PRN
Start: 2021-09-21 — End: 2021-09-22
  Administered 2021-09-21 – 2021-09-22 (×2): 6 mg via ORAL
  Filled 2021-09-21 (×2): qty 2

## 2021-09-21 MED ORDER — LIDOCAINE-EPINEPHRINE 1 %-1:100000 IJ SOLN
INTRAMUSCULAR | Status: AC
Start: 2021-09-21 — End: 2021-09-21
  Administered 2021-09-21: 2 mL via INTRADERMAL
  Filled 2021-09-21: qty 30

## 2021-09-21 NOTE — Nursing Note (Signed)
Illness Severity  stable      Patient Summary  77F with CAD s/p CABG 2006 and PCIs, moderate AS, pAF on apixaban, HTN, HL, prior CVA 06/2020, non-functioning right kidney, bipolar disorder who is now s/p attempted PCI of LAD, aborted d/t perforation with Dr. [name] on 4/10    - Aox4, VSS on RA. NSR/SB w/ 1AVB. Non-conducted PACs. Had a 2.72 sec pause, provider notified.   - Patient forgetful and needs redirection. Not-impulsive. Melatonin given and encouraging sleep hygiene  - R groin hematoma  stable overnight w/ bruising, pressure dressing in place.  - L groin OTA. No hematoma, pulses palpable.  - Foley w/ adequate UOP. LR 26m/hr for 6 hours overnight

## 2021-09-21 NOTE — Brief Procedure Note (Signed)
Procedure: Right groin pseudoaneurysm thrombin injection    Diagnosis: Right groin pseudoaneurysm    Brief description of procedure and findings:  Ultrasound was utilized to visualize the large groin hematoma with residual pseudoaneurysm with filling of the sac. The neck was short though thin and the common femoral artery was visualized underneath. The skin as anesthetized with 3 cc of 1% lidocaine and a long 21G needle was inserted into the sac that was continuing to perfuse per color-flow assessment on ultrasound. The needle as flushed with saline that confirmed adequate needle positioning in the patent sac. The sac was then injected with 0.5 cc of thrombin and the sac no longer was filling. The underlying common femoral artery had a multiphasic doppler waveform and the right PT was palpable at conclusion    Specimens removed: None    EBL: 0 ml    Plan:  Bedrest with HOB < 30 and R leg flat until 3 PM, then okay to sit up and have bathroom privileges (with foley removal)  Okay to resume antiplatelets today (can resume any anticoagulant tomorrow if duplex ultrasound negative)  Duplex in the AM then okay to DC  Follow up in 2 weeks with Dr. Tamala Julian for repeat duplex ultrasound

## 2021-09-21 NOTE — Progress Notes (Signed)
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 Now s/p unsuccessful first diagonal CTO PCI [name] 3/61/44), complicated by right femoral hematoma and pseudoanerysm.      SUBJECTIVE   INTERVAL HISTORY:  Yesterday:  - patient with enlarging hematoma after ambulation in this am. Pressure held for 30 mins.  - H&H stable  - CTA abdomen pelvis to rule out retroperitoneal bleed  - Genette reports intermittent nausea and continuous back pian with bedrest.  - daughter Serene called and updated     Overnight:  No acute events    Today:  - H&H 10.9->9.2  - thrombin injection by vascular at bedside  - mild hypotension in the afternoon 92/32 with associated dizziness, self resolved      SCHEDULED MEDICATIONS:   .  acetaminophen, 1,000 mg, q6h  .  amLODIPine, 10 mg, Daily  .  clopidogrel, 75 mg, Daily  .  cyanocobalamin, 1,000 mcg, Daily  .  ipratropium, 2 spray, BID  .  isosorbide mononitrate ER, 60 mg, q AM  .  lidocaine, 1 patch, q24h  .  lidocaine, 1 patch, q24h  .  lidocaine-EPINEPHrine, 30 mL, Once  .  lidocaine-EPINEPHrine, ,   .  lisinopril, 5 mg, q PM  .  metoprolol succinate ER, 50 mg, q HS  .  scopolamine, 1 patch, q72h  .  thrombin recombinant, 5,000 units, Once    INFUSED MEDICATIONS:  .  sodium chloride, 3 mL/hr, Continuous, Last Rate: Stopped (09/18/21 1627)     PRN MEDICATIONS:  .  atropine, 0.5 mg, PRN  .  melatonin, 6 mg, q HS PRN  .  methocarbamol, 750 mg, q6h PRN  .   metoclopramide, 5 mg, q6h PRN  .  ondansetron, 4 mg, q8h PRN  .  ondansetron, 4 mg, q8h PRN  .  oxyCODONE, 5 mg, q4h PRN  .  polyethylene glycol 3350, 17 g, Daily PRN  .  senna, 17.2 mg, BID PRN  .  sodium chloride, 250 mL, Once PRN       OBJECTIVE     Vitals (Most recent in last 24 hrs)     T: 36.2 C (09/21/21 0820)  BP: (!) 125/35 (09/21/21 0820)  HR: (!) 51 (09/21/21 0507)  RR: 16 (09/21/21 0820)  SpO2: 93 % (09/21/21 0820) Nasal cannula 2 L/min    T range: Temp  Min: 36.1 C  Max: 36.6 C  Admit weight: (!) 100 kg (220 lb 7.4 oz) (09/18/21 0820)  Last weight: (!) 100 kg (220 lb 7.4 oz) (09/18/21 0820)       I&Os:     Intake/Output Summary (Last 24 hours) at 09/21/2021 1000  Last data filed at 09/21/2021 0500  Intake --   Output 900 ml   Net -900 ml       Physical Exam  CONSTITUTIONAL: pleasant, conversational woman, lying in bed, NAD.    HENT: NCAT  EYES: sclera anicteric   CARDIOVASCULAR  NECK VEINS: no JVD.             PMI:not assessed            RHYTHM: RR.             HEART TONES: 3/6 holosystolic murmur heard throughout the precordium.             PULSES: 2+ bilateral radials, DP's dopplerable, PT's 1-2+.            EDEMA: no LE edema.   RESP/LUNGS: Lungs CTA bilaterally.  ABDOMEN: large, soft, non-distended, non-tender, no bruising  MUSCULOSKELETAL: no gross deformities  SKIN: warm and dry.   NEURO: alert and oriented, grossly non-focal.   PSYCH: normal mood and affect.   Access sites: right groin is firm with mod-large hematoma, large amount of bruising, no oozing. Pressure dressing in place.  left groin access tegaderm dressing CDI soft, without hematoma, bruising or oozing    Labs (last 24 hours):   Chemistries  CBC  LFT  Gases, other   136 103 25 101   9.2   AST: - ALT: -  -/-/-/-  -/-/-/-   4.2 23 1.10   7.96 >< 143  AP: - T bili: -  Lact (a): - Lact (v): -   eGFR: 53 Ca: 8.6   29   Prot: - Alb: -  Trop I: - D-dimer: -   Mg: - PO4: -  ANC: -     BNP: - Anti-Xa: -     ALC: -    INR: 1.1            Reviewed Results? Independently visualized & interpreted? Key Findings     Lab [x]  [x]     Radiology []  []     EKG/Tele/Echo  [x]  [x]     Other?  []  []             ASSESSMENT/PLAN      Wendy [name] is a 76 yo female admitted s/p unsuccessful diagonal CTO PCI complicated by small perforation and pseudoaneurysm (09/18/21) with Dr Marisa Sprinkles.     #Iatrogenic Pseudoaneurysm  Patient found to have moderate-large right groin hematoma that increased in size after ambulation. Vascular US revealed right groin pseudoaneurysm measuring 2.93 x 1.53 with large hematoma surrounding the pseudoaneurysm. CTA abdomen Pelvis on 4/12 with persistent pseudoaneurysm. Vascular Surgery was consulted and bedside thrombin injection performed on 4/13.   Vascular Surgery recommendations:  - Bedrest with HOB < 30 and R leg flat until 3 PM, then okay to sit up and have bathroom privileges (with foley removal)  - Okay to resume antiplatelets today (can resume any anticoagulant tomorrow if duplex ultrasound negative)  - Duplex in the AM (ordered) then okay to DC  - Follow up in 2 weeks with Dr. Tamala Julian for repeat duplex ultrasound   - Q12hr H&H       #CAD s/p PCI  #HLD  s/p CABG 2006 (LIMA-lAD, SVG-RCA, SVG-RI-D1, SVG-D3)  S/p unsuccessful diagonal CTO PCI (4/10). Procedure was complicated by small perforation requiring protamine and balloon tamponade.  Received loading dose of aspirin 324 mg pre-procedure.   Bifemoral access: LFA #19F perclosed, RFA #19F sheath left in place for a few hours given perforation then removed on the floor with transient bradycardia to the 30's. ECG post-episode stable showing sinus bradycardia without ST elevation or depression.    -- metoprolol succinate 40m at bedtime  -- isosorbide mononitrate 66mdaily  -- Plavix 75 mg daily  -- holding Eliquis  as above  -- Activity and vitals monitoring per orders  -- No driving for 62IR post procedure  -- Ok to shower and remove dressing 24-48hr post-procedure  -- 10 lb  weight limit for one week for femoral access  -- No soaking in tub or pool for at least 2wk post procedure  -- Follow up with primary cardiologist 2-4 weeks  -- follow up with Dr. [name] to discuss re-attempt (msg was sent to [facility] to schedule appointment in ~2 weeks).      EVIDENCE BASED MEDICATION FOR SECONDARY PREVENTION:   DAPT prescribed? No, prevent triple therapy   Statin prescribed? No       aspirin not indicated: avoidance of triple therapy       statin not indicated: allergy, hypertension     #pAF    On apixaban. CHADsVASc 6.   - holding Eliquis as above  - metoprolol succinate 416m daily      #HTN  - amlodipine 136mdaily  - lisinopril 16m12maily.    #Back pain  Chronic lower back, not on medications at home.   - Acetaminophen 1000m73mhr  - Lidocaine patch  - Oxycodone PRN  - Robaxin PRN    #Nausea  No emesis, nausea worse with bedrest.  - Zofran PRN  - Reglan PRN  - Scopolamine patch     #Renal hydronephrosis  Non functioning right kidney with severe hydronephrosis. Left kidney with non obstructive calculi. Is established with outpatient nephrologist.    Inpatient Checklist:    Fluids/Electrolytes/Nutrition: Healthy Heart diet  Prophylaxis: SCD's  Lines/Drains/Airways: PIV, right femoral sheath  Disposition: ICRU   Code Status: Full Code    Contacts: Primary Emergency Contact: MYERS,RUHIYYIH ("Serene"), Home Phone: [phone] spent a total of 35 minutes for the patient's care on the date of the service.

## 2021-09-21 NOTE — Nursing Note (Addendum)
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 [name] on 4/10    - NSR/SB w/ 1AVB  - Bps down to 90s/30s. Complained of some dizziness. 500LR bolus with effective relief.   - Patient forgetful and needs redirection. Not-impulsive. Does not always make sense when talking. Sometime slow to respond.   - R groin hematoma and echymosis large and outlined. Stable through the day.   - L groin OTA. No hematoma, pulses palpable.  - Tolerating po intake. Needs encouragement with eating.   - Back pain resolved with movement. No longer needed Lidocaine patches and Tylenol post bed rest.   - Voiding naturally.  - 1-2PA to bathroom. Bathroom privileges per vascular note.       Action Items  - Monitor vitals, tele, groin sites        Situational Awareness  - U/S of R groin shows pseudoaneurysm surrounded by hematoma

## 2021-09-21 NOTE — Progress Notes (Signed)
Progress Note     Wendy [name] [facility]") - DOB: 1945-09-08 76 year old female)  Gender Identity: Female  Preferred Pronouns: she/her/hers  Admit Date: 09/18/2021  Code Status: Full Code       CHIEF CONCERN / IDENTIFICATION:     Wendy [name] is a 76 year old female seen in consultation at the request of Dr. [name] for consultation regarding R femoral pseudoaneurysm.     SUBJECTIVE     Surgical/Procedural Cases on this Admission     Case IDs Date Procedure Surgeon Location Status    [id number] 09/18/21 PCI (Percutaneous Coronary Intervention) Serena Colonel, MD [place] CATH LAB Comp          INTERVAL HISTORY:  - CTA A/P yesterday showed R common femoral artery pseudoaneurysm 7.9 x 4.8 x 9.8 cm with patency in the superior portion.       SCHEDULED MEDICATIONS:   .  acetaminophen, 1,000 mg, q6h  .  amLODIPine, 10 mg, Daily  .  clopidogrel, 75 mg, Daily  .  cyanocobalamin, 1,000 mcg, Daily  .  ipratropium, 2 spray, BID  .  isosorbide mononitrate ER, 60 mg, q AM  .  lidocaine, 1 patch, q24h  .  lidocaine, 1 patch, q24h  .  lisinopril, 5 mg, q PM  .  metoprolol succinate ER, 50 mg, q HS  .  scopolamine, 1 patch, q72h  .  thrombin recombinant, 5,000 units, Once    INFUSED MEDICATIONS:  .  sodium chloride, 3 mL/hr, Continuous, Last Rate: Stopped (09/18/21 1627)     PRN MEDICATIONS:  .  atropine, 0.5 mg, PRN  .  melatonin, 6 mg, q HS PRN  .  methocarbamol, 750 mg, q6h PRN  .  metoclopramide, 5 mg, q6h PRN  .  ondansetron, 4 mg, q8h PRN  .  ondansetron, 4 mg, q8h PRN  .  oxyCODONE, 5 mg, q4h PRN  .  polyethylene glycol 3350, 17 g, Daily PRN  .  senna, 17.2 mg, BID PRN  .  sodium chloride, 250 mL, Once PRN       OBJECTIVE     Vitals (Most recent in last 24 hrs)     T: 36.1 C (09/21/21 0507)  BP: (!) 116/38 (09/21/21 0507)  HR: (!) 51 (09/21/21 0507)  RR: 19 (09/21/21 0507)  SpO2: 96 % (09/21/21 0507) Nasal cannula 2 L/min    T range: Temp  Min: 36.1 C  Max: 36.6 C  Admit weight: (!) 100 kg (220 lb 7.4  oz) (09/18/21 0820)  Last weight: (!) 100 kg (220 lb 7.4 oz) (09/18/21 0820)       I&Os:     Intake/Output Summary (Last 24 hours) at 09/21/2021 0750  Last data filed at 09/21/2021 0500  Intake --   Output 900 ml   Net -900 ml       Physical Exam  Gen: laying in bed flat, NAD  CV: extremities warm  Resp: unlabored breathing  Ext: R groin with significant bruising, stable and unchanged from yesterday    Labs (last 24 hours):   Chemistries  CBC  LFT  Gases, other   136 103 25 101   9.2   AST: - ALT: -  -/-/-/-  -/-/-/-   4.2 23 1.10   7.96 >< 143  AP: - T bili: -  Lact (a): - Lact (v): -   eGFR: 53 Ca: 8.6   29   Prot: - Alb: -  Trop  I: - D-dimer: -   Mg: - PO4: -  ANC: -     BNP: - Anti-Xa: -     ALC: -    INR: 1.1             ASSESSMENT/PLAN      Wendy [name] is a 76 year old female seen in consultation at the request of Dr. [name] for consultation regarding R femoral pseudoaneurysm.    Recommendations:  - Bed rest with R leg flat at all times and HOB < 30 degrees   - Plan today for thrombin injection of R femoral pseudoaneurysm. Will obtain consent. Please have thrombin at bedside (ordered).

## 2021-09-22 ENCOUNTER — Inpatient Hospital Stay (HOSPITAL_COMMUNITY): Payer: Medicare HMO

## 2021-09-22 DIAGNOSIS — I724 Aneurysm of artery of lower extremity: Secondary | ICD-10-CM

## 2021-09-22 DIAGNOSIS — R2241 Localized swelling, mass and lump, right lower limb: Secondary | ICD-10-CM

## 2021-09-22 LAB — CBC (HEMOGRAM)
Hematocrit: 28 % — ABNORMAL LOW (ref 36.0–45.0)
Hemoglobin: 9.2 g/dL — ABNORMAL LOW (ref 11.5–15.5)
MCH: 30.8 pg (ref 27.3–33.6)
MCHC: 32.9 g/dL (ref 32.2–36.5)
MCV: 94 fL (ref 81–98)
Platelet Count: 141 10*3/uL — ABNORMAL LOW (ref 150–400)
RBC: 2.99 10*6/uL — ABNORMAL LOW (ref 3.80–5.00)
RDW-CV: 13.2 % (ref 11.0–14.5)
WBC: 7.64 10*3/uL (ref 4.3–10.0)

## 2021-09-22 LAB — BASIC METABOLIC PANEL
Anion Gap: 5 (ref 4–12)
Calcium: 8.6 mg/dL — ABNORMAL LOW (ref 8.9–10.2)
Carbon Dioxide, Total: 27 meq/L (ref 22–32)
Chloride: 104 meq/L (ref 98–108)
Creatinine: 1.09 mg/dL — ABNORMAL HIGH (ref 0.38–1.02)
Glucose: 92 mg/dL (ref 62–125)
Potassium: 3.9 meq/L (ref 3.6–5.2)
Sodium: 136 meq/L (ref 135–145)
Urea Nitrogen: 26 mg/dL — ABNORMAL HIGH (ref 8–21)
eGFR by CKD-EPI 2021: 53 mL/min/{1.73_m2} — ABNORMAL LOW (ref >59)

## 2021-09-22 MED ORDER — ACETAMINOPHEN 500 MG OR TABS
1000.0000 mg | ORAL_TABLET | Freq: Four times a day (QID) | ORAL | Status: DC
Start: 2021-09-22 — End: 2021-09-23

## 2021-09-22 MED ORDER — PANTOPRAZOLE SODIUM 20 MG OR TBEC
40.0000 mg | DELAYED_RELEASE_TABLET | Freq: Every day | ORAL | Status: DC
Start: 2021-09-22 — End: 2022-05-15

## 2021-09-22 NOTE — Nursing Note (Signed)
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 [name] on 4/10    - VSS on RA,   - NSR/SB w/ 1AVB  - Patient forgetful and needs redirection. Not-impulsive. Does not always make sense when talking. Sometime slow to respond. Overnight, A/Ox2 but this morning she is x4.  - R groin hematoma and echymosis large and outlined. Unchanged, OTA.   - L groin OTA. No hematoma, pulses palpable.  - Tolerating po intake. Needs encouragement with eating.   - Voiding naturally, 1PA; bedrest other than BR privileges  - [place] this morning showed resolution of the pseudoaneurysm. Just waiting on vasc to sign off so pt can d/c       Action Items  - Monitor vitals, tele, groin sites  - Duplex in AM and possible discharge      Situational Awareness  - U/S of R groin shows pseudoaneurysm surrounded by hematoma. S/p thrombin by vascular 4/13

## 2021-09-22 NOTE — Nursing Note (Signed)
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 [name] on 4/10    2300-0700  - VSS on RA, occasional 2LNC while flat  - NSR/SB w/ 1AVB  - Patient forgetful and needs redirection. Not-impulsive. Does not always make sense when talking. Sometime slow to respond. Overnight, A/Ox2  - R groin hematoma and echymosis large and outlined. Unchanged overnight, OTA.   - L groin OTA. No hematoma, pulses palpable.  - Tolerating po intake. Needs encouragement with eating.   - Voiding naturally, 1-2PA; bedrest other than BR privileges

## 2021-09-22 NOTE — Progress Notes (Addendum)
Progress Note     Arienne Gartin [facility]") - DOB: [birth year] 76 year old female)  Gender Identity: Female  Preferred Pronouns: she/her/hers  Admit Date: 09/18/2021  Code Status: Full Code       CHIEF CONCERN / IDENTIFICATION:     Wendy [name] is a 76 year old female seen in consultation at the request of Dr. [name] for consultation regarding R femoral pseudoaneurysm s/p thrombin injection on 09/21/21.      SUBJECTIVE     Surgical/Procedural Cases on this Admission     Case IDs Date Procedure Surgeon Location Status    [id number] 09/18/21 PCI (Percutaneous Coronary Intervention) Serena Colonel, MD [place] CATH LAB Comp          INTERVAL HISTORY:  - R groin pseudoaneurysm thrombin injection yesterday. PT palpable at conclusion.   - NAEO  - Pending duplex US this morning      SCHEDULED MEDICATIONS:   .  acetaminophen, 1,000 mg, q6h  .  [Held By Provider] amLODIPine, 10 mg, Daily  .  clopidogrel, 75 mg, Daily  .  cyanocobalamin, 1,000 mcg, Daily  .  ipratropium, 2 spray, BID  .  isosorbide mononitrate ER, 60 mg, q AM  .  lidocaine, 1 patch, q24h  .  lidocaine, 1 patch, q24h  .  [Held By Provider] lisinopril, 5 mg, q PM  .  metoprolol succinate ER, 50 mg, q HS  .  scopolamine, 1 patch, q72h    INFUSED MEDICATIONS:  .  sodium chloride, 3 mL/hr, Continuous, Last Rate: Stopped (09/18/21 1627)     PRN MEDICATIONS:  .  atropine, 0.5 mg, PRN  .  melatonin, 6 mg, q HS PRN  .  methocarbamol, 750 mg, q6h PRN  .  metoclopramide, 5 mg, q6h PRN  .  ondansetron, 4 mg, q8h PRN  .  ondansetron, 4 mg, q8h PRN  .  oxyCODONE, 5 mg, q4h PRN  .  polyethylene glycol 3350, 17 g, Daily PRN  .  senna, 17.2 mg, BID PRN  .  sodium chloride, 250 mL, Once PRN       OBJECTIVE     Vitals (Most recent in last 24 hrs)     T: 36.2 C (09/22/21 0634)  BP: (!) 92/55 (09/22/21 0634)  HR: 61 (09/22/21 0634)  RR: 18 (09/22/21 0634)  SpO2: 92 % (09/22/21 0634) Room air  T range: Temp  Min: 36.2 C  Max: 36.4 C  Admit weight: (!) 100 kg  (220 lb 7.4 oz) (09/18/21 0820)  Last weight: (!) 100 kg (220 lb 7.4 oz) (09/18/21 0820)       I&Os:     Intake/Output Summary (Last 24 hours) at 09/22/2021 0806  Last data filed at 09/21/2021 1700  Intake 220 ml   Output 500 ml   Net -280 ml       Physical Exam  Gen: laying in bed flat, NAD  CV: extremities warm  Resp: unlabored breathing  Ext: R groin with bruising, unchanged.     Labs (last 24 hours):   Chemistries  CBC  LFT  Gases, other   - - - -   9.2   AST: - ALT: -  -/-/-/-  -/-/-/-   - - -   7.64 >< 141  AP: - T bili: -  Lact (a): - Lact (v): -   eGFR: - Ca: -   28   Prot: - Alb: -  Trop I: - D-dimer: -  Mg: - PO4: -  ANC: -     BNP: - Anti-Xa: -     ALC: -    INR: -             ASSESSMENT/PLAN      Wendy [name] is a 76 year old female seen in consultation at the request of Dr. [name] for consultation regarding R femoral pseudoaneurysm.    Recommendations:  - Duplex ultrasound this AM pending. If negative, can resume any anticoagulant per primary team.   - Ok to DC from vascular perspective if duplex US is negative.   - No follow up necessary with vascular surgery if duplex US negative

## 2021-09-22 NOTE — Hospital Course (Signed)
Hospital Course by Problem List:    #Iatrogenic Pseudoaneurysm  Patient found to have moderate-large right groin hematoma that increased in size after ambulation. Vascular US revealed right groin pseudoaneurysm measuring 2.93 x 1.53 with large hematoma surrounding the pseudoaneurysm. CTA abdomen Pelvis on 4/12 with persistent pseudoaneurysm. Vascular Surgery was consulted and bedside pseudoaneurysm thrombin injection performed on 4/13. 4/14 Duplex ultrasound negative and ok to discharge from vascular perspective. No follow up necessary with vascular surgery.   - restart anticoagulation evening 09/22/21.      #CAD s/p PCI  #HLD  s/p CABG 2006 (LIMA-lAD, SVG-RCA, SVG-RI-D1, SVG-D3)  S/p unsuccessful diagonal CTO PCI (4/10). Procedure was complicated by small perforation requiring protamine and balloon tamponade.  Received loading dose of aspirin 324 mg pre-procedure.   Bifemoral access: LFA #66F perclosed, RFA #66F sheath left in place for a few hours given perforation then removed on the floor with transient bradycardia to the 30's. ECG post-episode stable showing sinus bradycardia without ST elevation or depression. Wendy [name] remained in the hospital post-procedure for her right groin complication. From a cardiac standpoint she remained hemodynamically and clinically stabe throughout her 4 day hospital course.   -- metoprolol succinate '50mg'$  at bedtime  -- isosorbide mononitrate '60mg'$  daily  -- Plavix 75 mg daily  -- restarted Eliquis as above.   -- No driving for 54OE post procedure  -- Ok to shower and remove dressing 24-48hr post-procedure  -- 10 lb weight limit for one week for femoral access  -- No soaking in tub or pool for at least 2wk post procedure  -- Follow up with primary cardiologist 2-4 weeks (appointment scheduled with Dr. Bryson Corona at [facility] on 10/19/21).   -- follow up with Dr. [name] to discuss re-attempt (msg was sent to [facility] to schedule appointment in ~2 weeks).      EVIDENCE  BASED MEDICATION FOR SECONDARY PREVENTION:   DAPT prescribed? No, prevent triple therapy   Statin prescribed? No       aspirin not indicated: avoidance of triple therapy       statin not indicated: allergy, hypertension     #pAF    On apixaban. CHADsVASc 6.   - Eliquis '5mg'$  twice daily.   - metoprolol succinate '50mg'$  daily      #HTN  Both of her home antihypertensives (amlodipine 10 mg daily and lisinopril 5 mg daily) were held starting 4/13 for blood pressures as low as  90's/30's. She was discharged on only lisinopril. Patient was instructed to monitor home blood pressures for her primary care doctors review.   - amlodipine '10mg'$  daily was held on discharge.  - continue home lisinopril '5mg'$  daily.       #Back pain  Chronic lower back, not on medications at home.   - Acetaminophen '1000mg'$  Q8hr as needed.      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 Left kidney with non obstructive calculi. Is established with outpatient nephrologist.

## 2021-09-22 NOTE — Discharge Summary (Signed)
Discharge Summary     Wendy [name] [facility]") - DOB: [birth year] (76 year old female)  Gender Identity: Female  Preferred Pronouns: she/her/hers  PCP: Wendy Loop, MD   Code Status: Full Code        DATE OF ADMISSION: 09/18/2021  DATE OF DISCHARGE: 09/22/2021  DISCHARGE TEAM & ATTENDING: Cardiology I & Wendy Colonel, MD     ADMISSION DIAGNOSIS: Coronary artery disease.   DISCHARGE DIAGNOSIS: Coronary artery disease s/p unsuccessful diagonal CTO PCI, Right groin pseudoaneurysm.     PROBLEMS ADDRESSED DURING THIS HOSPITALIZATION:   Principal Problem:    Coronary artery disease involving coronary bypass graft of native heart with angina pectoris  Active Problems:    CAD S/P percutaneous coronary angioplasty    CAD in native artery    Pseudoaneurysm (HCC)  Resolved Problems:    * No resolved hospital problems. *    DISCHARGE FOLLOW-UP VISITS/APPOINTMENTS:    Upcoming appointments at [facility]:  Future Appointments   Date Time Provider [place]   10/19/2021  8:15 AM Wendy Ehrich, MD H [facility] H F WE      Additional follow-up:  No follow-up provider specified.    PENDING RESULTS THAT REQUIRE FOLLOW-UP (as of this summary):  Pending Labs     No pending labs          DIAGNOSTIC STUDIES RECOMMENDED:  None     INCIDENTAL FINDINGS THAT REQUIRE FOLLOW-UP:       None      THERAPEUTIC RECOMMENDATIONS:   Healthy Heart diet    ALLERGIES:  Hydrochlorothiazide, Lidocaine, Penicillins, Coconut fatty acids, Latex, Losartan, and Statins      DISCHARGE MEDICATIONS:   Discharge Medication List as of 09/22/2021  3:29 PM      CONTINUE these medications which have CHANGED    Details   acetaminophen 500 MG tablet Take 2 tablets (1,000 mg) by mouth every 6 hours.Not Printed      pantoprazole 20 MG EC tablet Take 2 tablets (40 mg) by mouth daily on an empty stomach. As neededNot Printed         CONTINUE these medications which have NOT CHANGED    Details   apixaban (Eliquis) 5 MG tablet Take 1 tablet (5 mg) by  mouth 2 times a day. Blood thinner for stroke prevention.Disp-180 tablet, R-1, Normal      cetirizine 10 MG tablet Take 1 tablet by mouth daily For allergy symptomsDisp-90 tablet, R-0, Normal      clopidogrel 75 MG tablet Take 1 tablet (75 mg) by mouth daily.Disp-30 tablet, R-11, Normal      cyanocobalamin 1000 MCG tablet Take 1 tablet (1,000 mcg) by mouth daily. For vitamin B-12 and memoryDisp-90 tablet, R-3, Normal      ipratropium 0.03 % nasal spray Spray 2 sprays into each nostril 2 times a day. For nasal congestion. Replaces fluticasoneDisp-30 mL, R-1, NormalPlease d/c fluticasone nasal spray rx      isosorbide mononitrate ER 60 MG 24 hr tablet TAKE ONE TABLET BY MOUTH IN THE MORNINGDisp-30 tablet, R-0, Normal      lisinopril 5 MG tablet Take 1 tablet (5 mg) by mouth every evening. For blood pressureDisp-100 tablet, R-3, NormalUpdate quantity per insurance      metoprolol succinate ER 50 MG 24 hr tablet Take 1 tablet (50 mg) by mouth daily.Disp-90 tablet, R-3, Normal      nitroGLYCERIN 0.4 MG SL tablet Place 1 tablet (0.4 mg) under the tongue every 5 minutes as  needed for chest pain for up to 3 doses. 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     HOSPITAL COURSE:   Hospital Course by Problem List:    #Iatrogenic Pseudoaneurysm  Patient found to have moderate-large right groin hematoma that increased in size after ambulation. Vascular US revealed right groin  pseudoaneurysm measuring 2.93 x 1.53 with large hematoma surrounding the pseudoaneurysm. CTA abdomen Pelvis on 4/12 with persistent pseudoaneurysm. Vascular Surgery was consulted and bedside pseudoaneurysm thrombin injection performed on 4/13. 4/14 Duplex ultrasound negative and ok to discharge from vascular perspective. No follow up necessary with vascular surgery.   - restart anticoagulation evening 09/22/21.      #CAD s/p PCI  #HLD  s/p CABG 2006 (LIMA-lAD, SVG-RCA, SVG-RI-D1, SVG-D3)  S/p unsuccessful diagonal CTO PCI (4/10). Procedure was complicated by small perforation requiring protamine and balloon tamponade.  Received loading dose of aspirin 324 mg pre-procedure.   Bifemoral access: LFA #77F perclosed, RFA #77F sheath left in place for a few hours given perforation then removed on the floor with transient bradycardia to the 30's. ECG post-episode stable showing sinus bradycardia without ST elevation or depression. Wendy [name] remained in the hospital post-procedure for her right groin complication. From a cardiac standpoint she remained hemodynamically and clinically stabe throughout her 4 day hospital course.   -- metoprolol succinate '50mg'$  at bedtime  -- isosorbide mononitrate '60mg'$  daily  -- Plavix 75 mg daily  -- restarted Eliquis as above.   -- No driving for 75FI post procedure  -- Ok to shower and remove dressing 24-48hr post-procedure  -- 10 lb weight limit for one week for femoral access  -- No soaking in tub or pool for at least 2wk post procedure  -- Follow up with primary cardiologist 2-4 weeks (appointment scheduled with Dr. Bryson [name] at [facility] on 10/19/21).   -- follow up with Dr. Alwyn [name] to discuss re-attempt (msg was sent to [facility] to schedule appointment in ~2 weeks).      EVIDENCE BASED MEDICATION FOR SECONDARY PREVENTION:   DAPT prescribed? No, prevent triple therapy   Statin prescribed? No       aspirin not indicated: avoidance of triple therapy       statin not  indicated: allergy, hypertension     #pAF    On apixaban. CHADsVASc 6.   - Eliquis '5mg'$  twice daily.   - metoprolol succinate '50mg'$  daily      #HTN  Both of her home antihypertensives (amlodipine 10 mg daily and lisinopril 5 mg daily) were held starting 4/13 for blood pressures as low as  90's/30's. She was discharged on only lisinopril. Patient was instructed to monitor home blood pressures for her primary care doctors review.   - amlodipine '10mg'$  daily was held on discharge.  - continue home lisinopril '5mg'$  daily.       #Back pain  Chronic lower back, not on medications at home.   - Acetaminophen '1000mg'$  Q8hr as needed.      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 Left kidney with non obstructive calculi. Is established with outpatient nephrologist.          DISPOSITION:    01 HOME/SELF CARE [01]    CONDITION: good     CONSULTS COMPLETED:    None     OPERATIONS/PROCEDURES:  Surgical/Procedural Cases on this Admission     Case IDs Date Procedure Surgeon Location Status    [phone] 09/18/21 PCI (Percutaneous Coronary Intervention) Wendy Colonel, MD [place] CATH LAB Comp          Additional procedures:  Pseudoaneurysm thrombin injection 09/21/21     PRINCIPAL DIAGNOSTIC STUDIES/RESULTS:    CTA Abdomen and Pelvis Angio  Result Date: 09/20/2021  Patent right common femoral artery pseudoaneurysm, as detailed above. Obstructing 1.4 cm calculus in the mid right ureter, likely chronic with resultant severe hydronephrosis and right renal cortical thinning. Hepatic steatosis. This is a preliminary report dictated by Laneta Simmers, MD (Abdominal Imaging Fellow). Please see finalized report for full details. Unless a comment appears below, an attending radiologist has not reviewed these images. ATTENDING FINAL REPORT I agree with the preliminary report. I have personally reviewed the images and agree with the report (or as edited).    Vascular US Pseudo-Aneurysm  Duplex Lower Extremity Right  Result Date: 09/22/2021  PRELIMINARY TECHNOLOGIST IMPRESSION: LOWER EXTREMITY ARTERIAL/VENOUS RIGHT 1. The previously documented right groin pseudoaneurysm is thrombosed. 2. There is a non-vascularized mass superficial to the distal common femoral artery measuring 8.6 cm wide, 3.7 cm deep and 9.7 cm long. Schuyler J. Thoman, BS, RVT Vascular Technologist FINAL PHYSICIAN INTERPRETATION     Vascular US Pseudo-Aneurysm Duplex Lower Extremity Right  Result Date: 09/21/2021  PRELIMINARY TECHNOLOGIST IMPRESSION: Suboptimal visualization due to patient body habitus and hematoma. LOWER EXTREMITY ARTERIAL/VENOUS RIGHT 1. There is a right groin pseudoaneurysm measuring 2.93 x 1.53 cm originating from the common femoral artery.  2. No evidence of significant arterial or venous injury in the right groin vessels examined. 3. There is a hematoma(5.61 x 6.64 x 7.87 cm) surrounding the pseudoaneurysm. 4. The common femoral artery has turbulent flow. Adline Potter BS, RVT Vascular Technologist Shelly Bombard Vascular Surgery Fellow FINAL PHYSICIAN INTERPRETATION     No discharge procedures on file.    DISCHARGE PHYSICAL EXAM:   Vitals (Most recent in last 24 hrs)     T: 36.9 C (09/22/21 1230)  BP: (!) 103/45 (09/22/21 1230)  HR: (!) 55 (09/22/21 1230)  RR: 16 (09/22/21 1230)  SpO2: 92 % (09/22/21 1230) Room air  T range: Temp  Min: 36.2 C  Max: 36.9 C  Admit weight: (!) 100 kg (220 lb 7.4 oz) (09/18/21 0820)  Last weight: (!) 100 kg (220 lb 7.4 oz) (09/18/21 0820)       Physical Exam  CONSTITUTIONAL: pleasant, conversant woman lying in bed, NAD.   HENT: NCAT  EYES: sclera anicteric   CARDIOVASCULAR (all elements in CV exam must be included)            NECK VEINS: no JVD.             PMI:not assessed            RHYTHM: bradycardic, regular rhythm.             HEART TONES: 3/6 holosystolic  murmur heard throughout the precordium.             PULSES: 2+ bilateral radials and 1-2+ DP's.              EDEMA: no LE edema.   RESP/LUNGS: Lungs CTA bilaterally  ABDOMEN: large, soft, non-distended, non-tender.   MUSCULOSKELETAL: no gross deformities  SKIN: warm and dry.    NEURO: alert and oriented, grossly non-focal.   Access sites: right groin is severely bruised with large hematoma present, no oozing.  Left groin is soft without oozing, minimal bruising, no hematoma.       [place] physicians mentioned in this note can be reached by calling the [facility] Paging Operator at [phone]. If any part of this transcript is missing or to request other transcripts for this patient call [phone]. For online access to patient records enroll in [place] [email or web address].

## 2021-09-22 NOTE — Nursing Note (Signed)
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 [name] on 4/10. R groin developed large hematoma, pseudoaneurysm was confirmed by vascular.     Action Items  Discharge order received, all discharge instruction given verbal and written with patient and son, both verbalized understanding. IV d/c'd, off unit in stable condition via wheelchair in stable condition at 1645.

## 2021-09-23 MED ORDER — ACETAMINOPHEN 500 MG OR TABS
1000.0000 mg | ORAL_TABLET | Freq: Three times a day (TID) | ORAL | Status: DC | PRN
Start: 2021-09-23 — End: 2022-05-15

## 2021-09-25 ENCOUNTER — Encounter (HOSPITAL_BASED_OUTPATIENT_CLINIC_OR_DEPARTMENT_OTHER): Payer: Self-pay | Admitting: Internal Medicine

## 2021-09-26 ENCOUNTER — Telehealth (HOSPITAL_BASED_OUTPATIENT_CLINIC_OR_DEPARTMENT_OTHER): Payer: Self-pay | Admitting: Internal Medicine

## 2021-09-26 ENCOUNTER — Encounter (HOSPITAL_BASED_OUTPATIENT_CLINIC_OR_DEPARTMENT_OTHER): Payer: Self-pay | Admitting: Internal Medicine

## 2021-09-26 ENCOUNTER — Ambulatory Visit: Payer: Medicare HMO

## 2021-09-26 NOTE — Telephone Encounter (Signed)
Pt discharged from [facility] on 4/14. Called and LVM to schedule hospital f/u. Direct CB# provided. Also sent MyChart msg.

## 2021-09-28 ENCOUNTER — Telehealth (HOSPITAL_BASED_OUTPATIENT_CLINIC_OR_DEPARTMENT_OTHER): Payer: Self-pay | Admitting: Cardiovascular Disease

## 2021-09-28 NOTE — Telephone Encounter (Addendum)
Called patient and left voicemail to call back to my direct number to schedule follow up with Dr. [name].    Abel Presto  Patient Services Specialist  [facility]  [phone]      ----- Message from [place], [place] sent at 09/19/2021  9:36 AM PDT -----  Good morning,    Patient is s/p unsuccessful CTO PCI. Dr. [name] would like to see her for follow up in clinic in ~2 weeks to discuss potential re-attempt.     Thank you,  Trena Platt ARNP

## 2021-10-01 ENCOUNTER — Other Ambulatory Visit (HOSPITAL_BASED_OUTPATIENT_CLINIC_OR_DEPARTMENT_OTHER): Payer: Self-pay | Admitting: Cardiovascular Disease

## 2021-10-01 DIAGNOSIS — I2510 Atherosclerotic heart disease of native coronary artery without angina pectoris: Secondary | ICD-10-CM

## 2021-10-02 ENCOUNTER — Encounter (HOSPITAL_BASED_OUTPATIENT_CLINIC_OR_DEPARTMENT_OTHER): Payer: Self-pay | Admitting: Internal Medicine

## 2021-10-02 DIAGNOSIS — I6930 Unspecified sequelae of cerebral infarction: Secondary | ICD-10-CM

## 2021-10-02 DIAGNOSIS — I25709 Atherosclerosis of coronary artery bypass graft(s), unspecified, with unspecified angina pectoris: Secondary | ICD-10-CM

## 2021-10-02 NOTE — Telephone Encounter (Signed)
Plan to print disabled parking permit form on tamper-proof paper on Thursday 10/05/21 when next at [facility] and sign [place] form.

## 2021-10-03 MED ORDER — ISOSORBIDE MONONITRATE ER 60 MG OR TB24
EXTENDED_RELEASE_TABLET | ORAL | 1 refills | Status: DC
Start: 2021-10-03 — End: 2022-01-17

## 2021-10-04 ENCOUNTER — Ambulatory Visit: Payer: Medicare HMO | Attending: Geriatric Medicine | Admitting: Geriatric Medicine

## 2021-10-04 ENCOUNTER — Other Ambulatory Visit (HOSPITAL_BASED_OUTPATIENT_CLINIC_OR_DEPARTMENT_OTHER): Payer: Self-pay | Admitting: Geriatric Medicine

## 2021-10-04 VITALS — BP 144/60 | HR 63 | Temp 97.2°F | Wt 221.5 lb

## 2021-10-04 DIAGNOSIS — T81718A Complication of other artery following a procedure, not elsewhere classified, initial encounter: Secondary | ICD-10-CM | POA: Insufficient documentation

## 2021-10-04 DIAGNOSIS — I25118 Atherosclerotic heart disease of native coronary artery with other forms of angina pectoris: Secondary | ICD-10-CM | POA: Insufficient documentation

## 2021-10-04 DIAGNOSIS — I10 Essential (primary) hypertension: Secondary | ICD-10-CM

## 2021-10-04 DIAGNOSIS — I729 Aneurysm of unspecified site: Secondary | ICD-10-CM

## 2021-10-04 LAB — CBC (HEMOGRAM)
Hematocrit: 36 % (ref 36.0–45.0)
Hemoglobin: 11.3 g/dL — ABNORMAL LOW (ref 11.5–15.5)
MCH: 30.7 pg (ref 27.3–33.6)
MCHC: 31.7 g/dL — ABNORMAL LOW (ref 32.2–36.5)
MCV: 97 fL (ref 81–98)
Platelet Count: 271 10*3/uL (ref 150–400)
RBC: 3.68 10*6/uL — ABNORMAL LOW (ref 3.80–5.00)
RDW-CV: 15.2 % — ABNORMAL HIGH (ref 11.0–14.5)
WBC: 5.37 10*3/uL (ref 4.3–10.0)

## 2021-10-04 NOTE — Patient Instructions (Addendum)
Good to see you [name]    1)  Go to lab to check blood count    2)  If BP >140 on average, then re-start amlodipine    3)  Go to [facility] with parking permit    Wendy [name] C. Ronalee Red, M.D.

## 2021-10-04 NOTE — Progress Notes (Signed)
Transitional Care Management Provider Note      Hospital or facility name:  [facility]   Date of discharge:  09/22/21  Patient received post-discharge communication within 2 business days:  '[x]'$ YES  '[]'$ NO  Reason for hospitalization and other complicating diagnoses:   CAD s/p staged PCI  Iatrogenic R groin pseudoaneursym  Anemia due to acute blood loss    Interval history since hospitalization and pertinent history from hospitalization:     Doing okay after d/c.  R groin pain has improved, but mass still present about the size of a fist.  Re-started on clopidogrel and apixban.    Still has angina, but not increasing in frequency or severity.  Wonders if she should re-trial PCI.  On imdur 60 mg daily + SL NTG PRN.    Was told to hold amlodipine on d/c.  Continued to lisinopril and metoprolol.  Home BP's on average <140's.        Records reviewed for this visit:   '[x]'$ Hospital Admit Note   '[x]'$ Hospital Discharge Note   '[]'$ Nursing home records    '[x]'$ Laboratory reviewed   '[x]'$ Imaging reviewed     '[x]'$ Consultations reviewed     Home health agency?  '[]'$ YES  '[x]'$ NO    Medication reconciliation done as required.   '[x]'$     Physical examination  BP (!) 144/60   Pulse 63   Temp 36.2 C (Temporal)   Wt (!) 100.5 kg (221 lb 8 oz)   SpO2 96%   BMI 41.85 kg/m   General:  Pleasant, well-appearing, NAD  Chest:  CTAB no WRR  CV:  RRR nl S1S2 grade IV >< murmur, no LEE  GI:  S/NT/ND, +BS.  R groin with firm mass about size of fist.  Ext:  2+ PT/DP pulses on R foot.  WWP.      Assessment and post discharge plans      1. Pseudoaneurysm following procedure ([place])  - CBC; Future  - DISABLED PARKING PERMIT AUTHORIZATION  --Monitor R groin size (organized hematoma) while on anticoagulation/anti-platelet agents    2. HTN:  BP near goal today.  --continue lisinopril and metoprolol  --f BP consistently >140, then re-start amlodipine 10 mg daily    3.  Chronic angina:  Stable.  --consider increase dose of Imdur vs repeat PCI    Return to clinic/Plan for  next check-in:  2 mo with PCP  No follow-ups on file.    Desaree Downen C. Ronalee Red, M.D.  Gerontology & Geriatric Medicine      LAB ADDENDUM    CBC shows near resolution of anemia with Hct 28 to 36%.       [place]

## 2021-10-05 NOTE — Telephone Encounter (Signed)
This was completed by Dr Eddie Candle at 10/04/21 appointment. Thanks.

## 2021-10-05 NOTE — Result Encounter Note (Signed)
Anemia from blood loss improved/resolving.

## 2021-10-17 ENCOUNTER — Telehealth (HOSPITAL_BASED_OUTPATIENT_CLINIC_OR_DEPARTMENT_OTHER): Payer: Self-pay | Admitting: Clinical

## 2021-10-17 NOTE — Telephone Encounter (Signed)
Outreach call to patient, left message.

## 2021-10-19 ENCOUNTER — Ambulatory Visit (HOSPITAL_BASED_OUTPATIENT_CLINIC_OR_DEPARTMENT_OTHER): Payer: Medicare HMO | Admitting: Cardiovascular Disease

## 2021-11-01 ENCOUNTER — Encounter (HOSPITAL_BASED_OUTPATIENT_CLINIC_OR_DEPARTMENT_OTHER): Payer: Self-pay | Admitting: Internal Medicine

## 2021-11-03 ENCOUNTER — Other Ambulatory Visit (HOSPITAL_BASED_OUTPATIENT_CLINIC_OR_DEPARTMENT_OTHER): Payer: Self-pay | Admitting: Internal Medicine

## 2021-11-03 DIAGNOSIS — I480 Paroxysmal atrial fibrillation: Secondary | ICD-10-CM

## 2021-11-03 MED ORDER — ELIQUIS 5 MG OR TABS
ORAL_TABLET | ORAL | 3 refills | Status: DC
Start: 2021-11-03 — End: 2022-11-30

## 2021-11-21 ENCOUNTER — Encounter (HOSPITAL_BASED_OUTPATIENT_CLINIC_OR_DEPARTMENT_OTHER): Payer: Self-pay | Admitting: Clinical

## 2021-11-22 MED ORDER — COVID-19MRNA BIVAL VACC PFIZER 30 MCG/0.3ML IM SUSP
30.0000 ug | Freq: Once | INTRAMUSCULAR | Status: DC
Start: 2021-11-23 — End: 2021-11-22

## 2021-11-22 NOTE — Progress Notes (Signed)
[place] SUBSEQUENT CARE VISIT NOTE       Chief Complaint   Patient presents with    Cough    Follow-Up        ID/CC:  Wendy [name] is a 76 year old community-dwelling female being seen for cough, post nasal drip and other concerns. Last visit with Dr Eddie Candle 10/04/21, with me 08/24/21. PMH includes coronary artery disease s/p CABG and PCI, aortic stenosis, CKD, non-functioning R kidney.     HPI:  Baseline: lives alone at apartment in [place] today by none  Transportation: Pensions consultant or not: NO    Interim history: PCI 09/18/21 with R groin pseudoaneurysm     Concerns today: #1 Post nasal drip with cough for at least 2 weeks, thinks possibly longer. Recently more sneezing. Sore throat, points to upper neck. No watery eyes. Feels tired and unwell. Took Zyrtec D once which helped but then had chest pain so didn't take again. Recalled needing to avoid decongestants due to heart. Just re-started ipratropium nasal spray this week. Known pine allergy. Concerned about pneumonia.    #2 Reports prior back pain actually improved after needing to sit on floor to clean up water from bathroom sink that overflowed, used dustpan to scoop up water.    #3 Coronary artery disease: noticing less chest pain and shortness of breath with walking. Now able to walk around her apartment building. Uses 4-wheeled walker. R groin pseudoaneurysm has decreased.    #4 Daughter wants her to request referral for hearing check.    #5 Mood question. Reports bipolar disorder diagnosis is incorrect. Previously seen in [place], per 11/21/21 MyChart message will be discharged from [facility]. BHIP provider also previously notified me that Ms. Perot was interested in talking about advance directive. Declines to discuss further today since says already discussing it.    Geriatric ROS: per my 03/27/21 note, carried forward  "ADLs: independent  IADLs: transportation as above [paratransit, recommended retiring from driving  60/73/71], food preparation doesn't like to cook, primarily uses microwave. Okay with shopping, managing medications (medi-set) and finances.  Mood: prior diagnosis  Vision: eyeglasses  Hearing: some concerns  Continence: Yes"  Mobility: 4-wheeled walker  CFS-9: 4-5     Social History:  DPOA/Legal Next of Kin/main contact person/main contact #s: DPOA-HC daughter Hulan Saas, son Vahid    Immunization History   Administered Date(s) Administered    COVID-19 Moderna mRNA monovalent 12 yrs and older 07/03/2019, 08/11/2019, 04/14/2020, 10/04/2020    COVID-19 Pfizer mRNA bivalent 12 yrs and older 03/14/2021    Influenza quadrivalent MDCK PF (Flucelvax) 04/18/2019    Influenza quadrivalent PF 07/11/2017    Influenza quadrivalent adjuvanted (Fluad) 03/17/2020, 03/14/2021    Influenza trivalent high-dose 03/04/2018    Pneumococcal conjugate PCV13 (Prevnar 13) 05/21/2014    Pneumococcal polysaccharide PPSV23 (Pneumovax 23) 11/14/2012    Tdap 11/14/2012              PHYSICAL EXAMINATION:  BP (!) 122/54   Pulse 71   Temp (!) 37.7 C (Temporal)   Wt 98 kg (216 lb)   SpO2 95%   BMI 40.81 kg/m    Wt Readings from Last 5 Encounters:   11/23/21 98 kg (216 lb)   10/04/21 (!) 100.5 kg (221 lb 8 oz)   09/18/21 (!) 100 kg (220 lb 7.4 oz)   08/24/21 (!) 99.7 kg (219 lb 14.4 oz)   07/17/21 99 kg (218 lb 4.1 oz)  GENERAL: pleasant older female. Appearance: well-groomed,  stated age, sitting comfortably, intermittently coughing .  HEENT: EARS: right auditory canal small amount cerumen, L auditory canal minimal cerumen, L tympanic membrane normal without erythema or effusion. Oropharynx: no exudate.  CV: regular rate and rhythm, normal S1, S2, 4/6 harsh systolic murmur throughout precordium, no rubs/S3S4  LUNGS: unlabored breathing, clear to auscultation bilaterally, no rhonchi  Gait: 4-wheeled walker     Labs/Other Results:  Orders Only on 10/04/2021   Component Date Value    WBC 10/04/2021 5.37     RBC 10/04/2021 3.68 (L)      Hemoglobin 10/04/2021 11.3 (L)     Hematocrit 10/04/2021 36     MCV 10/04/2021 97     MCH 10/04/2021 30.7     MCHC 10/04/2021 31.7 (L)     Platelet Count 10/04/2021 271     RDW-CV 10/04/2021 15.2 (H)             ASSESSMENT/PLAN:  1. Subacute cough: over 2 weeks, possibly longer associated with post nasal drip (see below); lungs clear with normal room air pulse oximetry  - cetirizine 10 MG tablet; Take 1 tablet (10 mg) by mouth daily. For allergies  Dispense: 90 tablet; Refill: 3  - Continue ipratropium nasal spray (just re-started earlier this week  - Recommended against using decongestant due to hypertension and coronary artery disease   - If not improving will refer to ENT  - May also consider alternate ACEI, note didn't tolerate losartan previously    2. Postnasal drip  - Continue ipratropium nasal spray (just re-started earlier this week  - Recommended against using decongestant due to hypertension and coronary artery disease   - If not improving will refer to ENT    3. Coronary artery disease involving coronary bypass graft of native heart with angina pectoris: s/p PCI 07/17/21 DES to SVG-PDA, PCI attempt 09/18/21  - Continue clopidogrel, isosorbide mononitrate, lisinopril, metoprolol  - Doesn't tolerate lipids, note previously referred to lipid clinic  - Cardiology follow up due (cancelled last month's follow up appointment) will request assistance with scheduling    4. Essential hypertension  - BP controlled today continue ACEI, beta-blocker, nitrate  - Renal function stable last checked 09/2021    5. Bilateral hearing loss, unspecified hearing loss type  - Referral to Audiology; Future @ [place]    6. Mood disorder ([place])  - Referral to Psychiatry; Future geropsychiatry for diagnosis clarification    7. Advance care planning  - Briefly discussed. DPOA-HC available in EHR and she is okay with her children as named DPOA-HC. Declined to further discuss health care directive today, plan to re-address at future visits.      Relevant Health Care Maintenance:  - Labs: diabetes screening 06/22/21 HbA1c 5.8% plan repeat ~06/2022; lipids 06/22/21; TSH normal 06/22/21; 25-OH vitamin D normal 08/05/19; vitamin B-12 normal methylmalonic acid 06/22/21 (prior vitamin B-12 220 04/06/21)  - Supplements: vitamin B-12  - Immunizations: defer COVID-19 bivalent booster #2 and recombinant zoster vaccine discussion (Medicare Part D requires @ pharmacy)  - Osteoporosis screening (DEXA): indicated N/A known osteoporosis. Last DEXA 05/2020 next due ~05/2022.  - Abdominal aortic aneurysm screening: indicated NO per USPSTF guideline (female, never smoked)  - Colon cancer screening: indicated YES deferred discussion today  - Mammogram/breast cancer screening: indicated YES deferred discussion today    Advance care planning/POLST: DPOA-HC daughter Hulan Saas and son Vahid available in EHR. See above.    Return in about 2 months (around 01/23/2022).  01/25/2022 9:20 AM (Arrive by 9:05 AM) Debroah Loop, MD [facility]           I spent a total of 45 minutes for the patient's care on the date of the service.

## 2021-11-23 ENCOUNTER — Ambulatory Visit: Payer: Medicare HMO | Attending: Internal Medicine | Admitting: Internal Medicine

## 2021-11-23 VITALS — BP 122/54 | HR 71 | Temp 99.9°F | Wt 216.0 lb

## 2021-11-23 DIAGNOSIS — R052 Subacute cough: Secondary | ICD-10-CM | POA: Insufficient documentation

## 2021-11-23 DIAGNOSIS — I10 Essential (primary) hypertension: Secondary | ICD-10-CM | POA: Insufficient documentation

## 2021-11-23 DIAGNOSIS — H9193 Unspecified hearing loss, bilateral: Secondary | ICD-10-CM | POA: Insufficient documentation

## 2021-11-23 DIAGNOSIS — R0982 Postnasal drip: Secondary | ICD-10-CM | POA: Insufficient documentation

## 2021-11-23 DIAGNOSIS — F39 Unspecified mood [affective] disorder: Secondary | ICD-10-CM | POA: Insufficient documentation

## 2021-11-23 DIAGNOSIS — Z7189 Other specified counseling: Secondary | ICD-10-CM | POA: Insufficient documentation

## 2021-11-23 DIAGNOSIS — I25709 Atherosclerosis of coronary artery bypass graft(s), unspecified, with unspecified angina pectoris: Secondary | ICD-10-CM | POA: Insufficient documentation

## 2021-11-23 MED ORDER — CETIRIZINE HCL 10 MG OR TABS
10.0000 mg | ORAL_TABLET | Freq: Every day | ORAL | 3 refills | Status: DC
Start: 2021-11-23 — End: 2022-10-01

## 2021-11-23 NOTE — Patient Instructions (Addendum)
Thank you for choosing [facility] for your care. You saw Ether Griffins, MD, MPH today at [facility]. Please call [phone] option #2 with any urgent questions or concerns.      Post-nasal drip: CONTINUE cetirizine (Zyrtec) without decongestant PLUS ipratropium nasal spray each nostril twice daily   Lungs are clear with normal oxygen level  Throat: warm liquids, can take a little honey  Can use Vicks Vapo Rub on skin  If not helping will have you see ear/nose/throat (ENT) specialist    Referral for hearing evaluation ordered    Glad that you are less tired with walking. This is a good sign.    Question of bipolar disorder: psychiatry referral ordered to clarify diagnosis

## 2021-11-24 ENCOUNTER — Telehealth (HOSPITAL_BASED_OUTPATIENT_CLINIC_OR_DEPARTMENT_OTHER): Payer: Self-pay

## 2021-11-24 NOTE — Telephone Encounter (Signed)
Left message for patient to call [phone] to schedule follow-up appointment.    When patient calls back: please schedule return appt with Dr.chen.  follow up with Dr Bridgett Larsson preferably when in [facility].

## 2021-12-01 ENCOUNTER — Encounter (HOSPITAL_BASED_OUTPATIENT_CLINIC_OR_DEPARTMENT_OTHER): Payer: Self-pay | Admitting: Internal Medicine

## 2021-12-18 ENCOUNTER — Encounter (HOSPITAL_BASED_OUTPATIENT_CLINIC_OR_DEPARTMENT_OTHER): Payer: Self-pay | Admitting: Internal Medicine

## 2021-12-27 ENCOUNTER — Other Ambulatory Visit (HOSPITAL_BASED_OUTPATIENT_CLINIC_OR_DEPARTMENT_OTHER): Payer: Self-pay | Admitting: Adult Health

## 2021-12-27 DIAGNOSIS — I2510 Atherosclerotic heart disease of native coronary artery without angina pectoris: Secondary | ICD-10-CM

## 2021-12-29 ENCOUNTER — Other Ambulatory Visit (HOSPITAL_BASED_OUTPATIENT_CLINIC_OR_DEPARTMENT_OTHER): Payer: Self-pay | Admitting: Internal Medicine

## 2021-12-29 MED ORDER — METOPROLOL SUCCINATE ER 50 MG OR TB24
EXTENDED_RELEASE_TABLET | ORAL | 0 refills | Status: DC
Start: 2021-12-29 — End: 2022-01-17

## 2022-01-01 NOTE — Telephone Encounter (Signed)
Amlodipine 10 mg tabs was discontinued off medication list on 09/22/21.  Reason for discontinuation: patient discharged  Amlodipine 10 mg tabs last filled #90 on 09/04/21 per dispensing report.      09/22/21:  "#HTN  Both of her home antihypertensives (amlodipine 10 mg daily and lisinopril 5 mg daily) were held starting 4/13 for blood pressures as low as  90's/30's. She was discharged on only lisinopril. Patient was instructed to monitor home blood pressures for her primary care doctors review.   - amlodipine '10mg'$  daily was held on discharge.  - continue home lisinopril '5mg'$  daily."    10/04/21:  "--f BP consistently >140, then re-start amlodipine 10 mg daily "    11/23/21:  "- BP controlled today continue ACEI, beta-blocker, nitrate "    Refused.

## 2022-01-10 ENCOUNTER — Encounter (HOSPITAL_BASED_OUTPATIENT_CLINIC_OR_DEPARTMENT_OTHER): Payer: Self-pay | Admitting: Internal Medicine

## 2022-01-12 NOTE — Telephone Encounter (Signed)
Agree with RN recommendation re-try PPI.    Start time: 5:22 PM  End time: 5:23 PM

## 2022-01-15 ENCOUNTER — Other Ambulatory Visit (HOSPITAL_BASED_OUTPATIENT_CLINIC_OR_DEPARTMENT_OTHER): Payer: Self-pay | Admitting: Adult Health

## 2022-01-15 ENCOUNTER — Other Ambulatory Visit (HOSPITAL_BASED_OUTPATIENT_CLINIC_OR_DEPARTMENT_OTHER): Payer: Self-pay | Admitting: Internal Medicine

## 2022-01-15 ENCOUNTER — Other Ambulatory Visit (HOSPITAL_BASED_OUTPATIENT_CLINIC_OR_DEPARTMENT_OTHER): Payer: Self-pay | Admitting: Cardiovascular Disease

## 2022-01-15 DIAGNOSIS — I2510 Atherosclerotic heart disease of native coronary artery without angina pectoris: Secondary | ICD-10-CM

## 2022-01-15 DIAGNOSIS — I10 Essential (primary) hypertension: Secondary | ICD-10-CM

## 2022-01-15 DIAGNOSIS — R0789 Other chest pain: Secondary | ICD-10-CM

## 2022-01-15 DIAGNOSIS — R413 Other amnesia: Secondary | ICD-10-CM

## 2022-01-17 MED ORDER — VITAMIN B-12 1000 MCG OR TABS
ORAL_TABLET | ORAL | 3 refills | Status: DC
Start: 2022-01-17 — End: 2023-01-30

## 2022-01-17 MED ORDER — NITROGLYCERIN 0.4 MG SL SUBL
0.4000 mg | SUBLINGUAL_TABLET | SUBLINGUAL | 3 refills | Status: DC | PRN
Start: 2022-01-17 — End: 2022-05-15

## 2022-01-17 MED ORDER — ISOSORBIDE MONONITRATE ER 60 MG OR TB24
EXTENDED_RELEASE_TABLET | ORAL | 0 refills | Status: DC
Start: 2022-01-17 — End: 2022-05-31

## 2022-01-17 MED ORDER — METOPROLOL SUCCINATE ER 50 MG OR TB24
50.0000 mg | EXTENDED_RELEASE_TABLET | Freq: Every day | ORAL | 0 refills | Status: DC
Start: 2022-01-17 — End: 2022-03-09

## 2022-01-17 NOTE — Telephone Encounter (Signed)
Patient last seen on 07/20/21 and was to return in 6 months with Dr. Bridgett Larsson.    One refill authorized. Please schedule follow up visit.

## 2022-01-24 ENCOUNTER — Other Ambulatory Visit: Payer: Self-pay

## 2022-01-24 NOTE — Progress Notes (Addendum)
[place] SUBSEQUENT CARE VISIT NOTE       Chief Complaint   Patient presents with    Follow-Up        ID/CC:  Wendy [name] is a 76 year old community-dwelling female being seen for cough, chest pain and other concerns. Last visit with me 11/23/20. PMH includes CAD with angina s/p CABG, nephrolithiasis with non-functioning R kidney, aortic stenosis, history of R MCA CVA, osteoporosis, memory difficulties.     HPI:  Baseline: lives alone at [facility])  Accompanied today by none  Transportation: Pensions consultant or not: NO     Concerns today:  #1 Cough non-productive for at least a couple weeks. +nasal drainage when blows nose +post nasal drip. Notices most when enters room in apartment. Also when lying flat. Has wedge pillow but slips down during night. Had one episode of vomiting due to cough. Thinks may have some heartburn, tends to graze. Not taking PPI. Using cough drops. Taking cetirizine.    #2 Noticing more molten lava feeling in chest when walking. This improved after PCI 09/18/21. Re-started around same time cough started. Walks about 250 steps then needs to sit (has wheeled walker with seat).    #3 Ear pain. Daughter has concerns about her hearing. Referred to audiology at last appointment.    #4 Appetite not good. However, today feels hungry.    #5 Thinks may be developing hemorrhoid. Doesn't need to push or strain with BMs. Sometimes loose BMs.    #6 Vaginal itching x3-4 weeks. No vaginal discharge.    #7 Interested in acupuncture, finds it helpful.    Seen with RN care manager.    Geriatric ROS: per my 03/27/21 and 11/23/21 notes, carried forward with addendum  "ADLs: independent  IADLs: transportation as above [paratransit, recommended retiring from driving 19/50/93], food preparation doesn't like to cook, primarily uses microwave. Okay with shopping, managing medications (medi-set) and finances.  Mood: prior diagnosis  Vision: eyeglasses  Hearing: some  concerns  Continence: [01/25/22 reports wearing incontinence undergarments]"  Mobility: 4-wheeled walker  CFS-9: 4     Social History:  DPOA/Legal Next of Kin/main contact person/main contact #s: DPOA-HC daughter Hulan Saas, son Vahid available in EHR            PHYSICAL EXAMINATION:  BP 130/86   Pulse 66   Temp 36.8 C (Temporal)   Resp 18   Wt 99.3 kg (219 lb)   SpO2 96% Comment: room air  BMI 41.38 kg/m    Wt Readings from Last 5 Encounters:   01/25/22 99.3 kg (219 lb)   11/23/21 98 kg (216 lb)   10/04/21 (!) 100.5 kg (221 lb 8 oz)   09/18/21 (!) 100 kg (220 lb 7.4 oz)   08/24/21 (!) 99.7 kg (219 lb 14.4 oz)       GENERAL: pleasant older female. Appearance: well-groomed,  stated age, sitting comfortably .  HEENT: Ears: R auditory canal with cerumen, discomfort when pressing on tragus. L auditory canal small amount cerumen non-obstructive, L tympanic membrane normal  CV: regular rate and rhythm, normal S1, S2, 4/6 low-pitched holosystolic murmur, no OIZT/I4P8. No BLE edema.  LUNGS: unlabored breathing, clear to auscultation bilaterally  Neurologic: memory difficulty noted  Gait: 4-wheeled walker with seat    Re-examined R ear after cerumen lavage, minimal cerumen remaining with normal R tympanic membrane      EKG: rate 60 bpm, sinus rhythm with 1st degree AV block, normal axis, PR 232 ms,  QRS 84 ms, QTc 428 ms. T wave inversion leads I, aVL, V1, V4-6 more pronounced than previous EKGs, ST elevation leads III, aVR more pronounced than previous EKGs          ASSESSMENT/PLAN:  1. Coronary artery disease involving coronary bypass graft of native heart with angina pectoris: PCI 07/17/21 SVG-PDA, 09/18/21 unsuccessful PCI 1st & 2nd diagonals, complicated by perforation  - EKG 12-Lead; Future  - Referral to Acupuncture; Future  - XR Chest 2 View; Future  - EKG 12-Lead  - Checking with cardiology for recommended follow up (interventional versus general)  - Continue clopidogrel, metoprolol, isosorbide mononitrate  - Note  previously referred to lipid clinic    2. Subacute cough  - May be related to post nasal drip and/or GERD  - Continue cetirizine  - May consider nasal corticosteroid spray trial  - Re-start PPI  - XR Chest 2 View; Future    3. Aortic stenosis, mild  - EKG 12-Lead; Future  - EKG 12-Lead    4. Gastroesophageal reflux disease without esophagitis  - Recommend re-start pantoprazole    5. Non-functioning R kidney  - Will follow renal function  - Caution with medications that may affect renal function    6. Osteoporosis without current pathological fracture, unspecified osteoporosis type: also known hyperparathyroidism  - Dexa, Axial Skeleton; Future for late 05/2022 or afterward    7. Impacted cerumen of right ear  - Ear Cerumen Removal    8. Otalgia of both ears  - Reports R ear improved after lavage    9. Migraine without status migrainosus, not intractable, unspecified migraine type  10. Primary osteoarthritis of right knee  - Diagnoses included for acupuncture referral  - Referral to Acupuncture; Future discussed 1 acupuncturist in [place] system so anticipate long waiting period for appointment     11. Vaginal itching  - Prefers monitor at this time, notify if symptoms worsen    12. Screening mammogram for breast cancer  - Screening Mammo w/Tomo Bilateral; Future not urgent, to schedule     13. Hx of CABG  - EKG 12-Lead; Future    14. History of R metatarsal fractures  - Referral to Acupuncture; Future diagnosis included for acupuncture referral    Greatly appreciate RN care management.    Relevant Health Care Maintenance:  - Labs: diabetes screening HbA1c 5.8% 06/22/21 plan repeat ~06/2022; lipids 06/22/21; TSH normal 06/22/21; 25-OH vitamin D normal 07/16/19; methylmalonic acid normal 06/22/21 (prior vitamin B-12 220 on 03/27/21)  - Supplements: vitamin B-12  - Immunizations: defer COVID-19 bivalent booster #2 to when updated vaccine available this fall; defer recombinant zoster vaccine (Medicare Part D requires @  pharmacy)  - Osteoporosis screening (DEXA): indicated N/A known osteoporosis. Last DEXA 05/2020 next due ~05/2022; DEXA scan and DEXA scan ordered to follow up existing osteoporosis late 05/2022 or later.  - Abdominal aortic aneurysm screening: 09/20/21 CTA abdomen/pelvis no AAA  - Colon cancer screening: indicated YES defer due to angina  - Mammogram/breast cancer screening: indicated YES ordered today    Advance care planning/POLST: DPOA-HC daughter Hulan Saas and son Vahid available in EHR. Plan address advance care planning at future visits.    Return in about 1 month (around 02/25/2022).              03/22/2022 9:20 AM (Arrive by 9:05 AM) Debroah Loop, MD [facility]    03/22/2022 9:20 AM (Arrive by 9:05 AM) [facility]  I spent a total of 48 minutes for the patient's care on the date of the service.       [facility]  Hospital Encounter on 01/25/22   1. XR Chest 2 View    Narrative    EXAMINATION:  XR CHEST 2 VW    CLINICAL INDICATION:   Dry cough for 3-4 weeks    COMPARISON:    06/22/2021.    FINDINGS AND     Impression    Lungs: No focal consolidation to suggest pneumonia. Persistent elevation of the right hemidiaphragm with associated right lung base atelectasis.    Pleura: The right costophrenic angle is not sharp, which may represent trace pleural effusion versus pleural thickening. No pneumothorax.    Heart and mediastinum: Unchanged cardiac silhouette. Coronary artery assessment.    Bones: Prior median sternotomy. No acute osseous abnormality.     CXR no definite abnormality that would cause cough. Plan as above.

## 2022-01-24 NOTE — Progress Notes (Signed)
Nurse Care Management Brief Note:  Patient was referred to RN Care management and Care coordination by Debroah Loop, MD on 12/28/21.   The concerns are: Has multiple health conditions including CAD with angina, cognitive difficulties though interestingly her last MoCA fall 2022 27/30. Seems she has a lot of difficulty with organizing her medical care (lives alone), though her children try to help. Marland Kitchen    PCP: Debroah Loop, MD    Contacted Via: [facility]/Senior care clinic    Location: N/A    The language the patient speaks: [place]   Interpreter present: None.    Summary:   Patient is in clinic for a follow up visit with Dr. Ether Griffins.   Mobility: Ambulating independently with a 4 wheels upright walker.   Accompanied by: None    Met with patient in clinic today. Explained the program of RN Care Coordination and Care Management and the role of this RN CM to patient. Patient verbally agrees to enroll into this program.     XR Chest 2 View: The order was placed by Dr. Ether Griffins on 01/25/22. Completed on 01/25/22    Screening Mammogram: The order was placed by Dr. Ether Griffins on 01/25/22. Will follow up.     Dexa scan: The order was placed by Dr. Ether Griffins on 01/25/22. Will follow up.     Referral to Acupuncture: The referral was placed by Dr. Ether Griffins on 01/25/22. Will follow up.     Referral to Audiology: The referral was placed by Dr. Ether Griffins on 11/23/21. The clinic attempted to call patient x 3. No appointment was made. This RN CM will assist in scheduling an appointment.   Reason for referral: 76 year old female with hearing loss for audiology evaluation     Referral to Psychiatry/Senior care clinic: The referral was placed by Dr. Ether Griffins on 11/23/21. The clinic attempted to call patient x 1. No appointment was made. This RN CM will assist in scheduling an appointment.   REASON FOR REFERRAL: 76 year old female with mood symptoms, outside EHR reports bipolar disorder please clarify  diagnosis and any recommended treatment.    BHIP: Had a Telemedicine visit with Arman Bogus MSW on 09/06/21. Next Telemedicine visit scheduled with Arman Bogus MSW on 09/20/21 which was cancelled. Patient is interested to join Dr. Ivin Booty Romm's depression and anxiety support group if it is via zoom. This RN CM will clarify with Mana.     Cardiology: Saw Dr. Bryson Corona on 10/27/21. The plan was to follow up in 6 months (around 04/29/22). Had an appointment scheduled with Dr. Bridgett Larsson in [place] on 10/19/21 but it was cancelled.     [facility]/: Had a Telemedicine visit with Dr. Marisa Sprinkles on 08/10/21.  No future appointment.     Nutrition: Saw Ed Blalock RD for hyperlipidemia on 08/24/21.  The plan was to follow up on 09/01/21 to focus on prevention of diabetes and fiber. Had a Telemedicine follow up visit scheduled on 09/01/21 which was cancelled. No future appointment.     Senior care clinic: Saw Dr. Ether Griffins today, 01/25/22. Follow up visit scheduled on 03/22/22.  After visit instructions on 01/25/22:  -Cough: please stop by x-ray on 1st floor for chest X-ray today  START pantoprazole in evening with your other 9 PM medication  -Molten lava feeling in chest: cardiology follow up, I am going to check into whether you should see Dr Bridgett Larsson or Dr [name] who did your procedure  -  Right ear has wax  -Hearing: audiology referral ordered June 15  -Vaginal itching: let me know if gets worse   -Acupuncture referral ordered. Please expect a long waiting period for an appointment, since there is 1 acupuncturist in our [place] system.    Sleep Medicine clinic: Had a Telemedicine visit with Dr. Jenelle Mages for evaluation of OSA (obstructive sleep apnea) on 04/03/21. The plan was to have home sleep apnea testing, with f/u polysomnography. Follow up visit to discuss results. No future appointment.     Stroke clinic: Saw Dr. Floyde Parkins to follow up right temporoparietal ischemic stroke etiology  cardioembolic on 1/69/67.  The follow up plan was to return in about 6 months (around 04/07/21). No future appointment.     Hypertension:   BP Readings from Last 3 Encounters:   01/25/22 130/86   11/23/21 (!) 122/54   10/04/21 (!) 144/60     Medications management:  Meds refill:   Pharmacy: [facility]    Current meds list reviewed:  Outpatient Medications Prior to Visit    Medication Sig Dispense Refill     acetaminophen 500 MG tablet Take 2 tablets (1,000 mg) by mouth every 8 hours as needed for moderate pain.   Rarely taking.    apixaban (Eliquis) 5 MG tablet TAKE ONE TABLET BY MOUTH TWICE DAILY FOR BLOOD THINNER FOR STROKE PREVENTION 180 tablet 3 Taking    cetirizine 10 MG tablet Take 1 tablet (10 mg) by mouth daily. For allergies 90 tablet 3 Taking    clopidogrel 75 MG tablet Take 1 tablet (75 mg) by mouth daily. 30 tablet 11 Taking at 9:30PM    cyanocobalamin (Vitamin B-12) 1000 MCG tablet Take 1 tablet (1,000 mcg) by mouth daily. For vitamin B-12 and memory 90 tablet 3 Taking    ipratropium 0.03 % nasal spray Spray 2 sprays into each nostril 2 times a day. For nasal congestion. Replaces fluticasone 30 mL 1 Taking    isosorbide mononitrate ER 60 MG 24 hr tablet TAKE ONE TABLET BY MOUTH EVERY MORNING 90 tablet 0 Taking    lisinopril 5 MG tablet Take 1 tablet (5 mg) by mouth every evening. For blood pressure 100 tablet 3 Taking at 9:30PM    metoprolol succinate ER 50 MG 24 hr tablet Take 1 tablet (50 mg) by mouth daily. 90 tablet 0 Taking at 9:30PM    nitroGLYCERIN 0.4 MG SL tablet Place 1 tablet (0.4 mg) under the tongue every 5 minutes as needed for chest pain for up to 100 doses. If no relief, call 911. 25 tablet 3 Taking as needed. Took 2 tabs over one week ago.    pantoprazole 20 MG EC tablet Take 2 tablets (40 mg) by mouth daily on an empty stomach. As needed   Has not been taking     No facility-administered medications prior to visit.     Immunizations:   Covid: Had Moderna 10/04/20, 04/14/20, 08/11/19,  07/03/19. Had [place] bivalent on 03/14/21.    PHQ-9:   Date: 08/09/21  Total score: 10    Katz Index of Independence in Activities of Daily Living:   Total Points: 5 (Incontinence of urine and BM sometimes. Wearing attends)  6=High  (patient independent)  0=Low (patient very dependent)    POLST: No record in chart.    DPOA for health care: An unsigned DPOA for health care scanned into chart.     Communication: By phone or text message.     Living situation: Lives  alone in a senior housing.     Preference: Appointment prefer after 10AM. Prefer on Wed or Fri.    Support: Daughter, Hulan Saas (phone #: [phone]), Ernst Bowler (phone # [phone]). Patient gave verbal consent to contact daughter and son about patient's care.     Transportation: [employer] bus.    Assessment/Plan:   Patient gave verbal consent to participate the RN Care Management and Care Coordination. This RN CM business card/contact information given to patient. Invited patient to call this RN CM if she has questions or concerns about her care.     -Screening Mammogram   -Dexa scan  -Referral to Acupuncture  -Referral to Audiology  -Referral to Psychiatry  -BHIP f/u  -Cardiology f/u due 05/09/22  -Nutrition f/u  -Sleep study.  -Sleep medicine clinic f/u after sleep study.  -Strok clinic f/u  -PHQ-9  -KATZ due 07/28/22    Will continue to provide support in RN Care management and care coordination.     Next Follow up:               03/22/2022 9:20 AM (Arrive by 9:05 AM) Debroah Loop, MD [facility]    03/22/2022 9:20 AM (Arrive by 9:05 AM) [facility]

## 2022-01-25 ENCOUNTER — Encounter (HOSPITAL_BASED_OUTPATIENT_CLINIC_OR_DEPARTMENT_OTHER): Payer: Self-pay | Admitting: Internal Medicine

## 2022-01-25 ENCOUNTER — Ambulatory Visit (HOSPITAL_BASED_OUTPATIENT_CLINIC_OR_DEPARTMENT_OTHER): Payer: Medicare HMO | Admitting: Registered Nurse

## 2022-01-25 ENCOUNTER — Ambulatory Visit (HOSPITAL_BASED_OUTPATIENT_CLINIC_OR_DEPARTMENT_OTHER): Payer: Medicare HMO | Admitting: Internal Medicine

## 2022-01-25 ENCOUNTER — Ambulatory Visit
Admission: RE | Admit: 2022-01-25 | Discharge: 2022-01-25 | Disposition: A | Discharge status: Home / Self Care | Payer: Medicare HMO | Attending: Internal Medicine | Admitting: Internal Medicine

## 2022-01-25 VITALS — BP 130/86 | HR 66 | Temp 98.2°F | Resp 18 | Wt 219.0 lb

## 2022-01-25 DIAGNOSIS — Z8781 Personal history of (healed) traumatic fracture: Secondary | ICD-10-CM | POA: Insufficient documentation

## 2022-01-25 DIAGNOSIS — M810 Age-related osteoporosis without current pathological fracture: Secondary | ICD-10-CM

## 2022-01-25 DIAGNOSIS — I25709 Atherosclerosis of coronary artery bypass graft(s), unspecified, with unspecified angina pectoris: Secondary | ICD-10-CM

## 2022-01-25 DIAGNOSIS — K219 Gastro-esophageal reflux disease without esophagitis: Secondary | ICD-10-CM

## 2022-01-25 DIAGNOSIS — H6121 Impacted cerumen, right ear: Secondary | ICD-10-CM

## 2022-01-25 DIAGNOSIS — I440 Atrioventricular block, first degree: Secondary | ICD-10-CM

## 2022-01-25 DIAGNOSIS — I350 Nonrheumatic aortic (valve) stenosis: Secondary | ICD-10-CM

## 2022-01-25 DIAGNOSIS — N898 Other specified noninflammatory disorders of vagina: Secondary | ICD-10-CM

## 2022-01-25 DIAGNOSIS — G43909 Migraine, unspecified, not intractable, without status migrainosus: Secondary | ICD-10-CM

## 2022-01-25 DIAGNOSIS — N289 Disorder of kidney and ureter, unspecified: Secondary | ICD-10-CM

## 2022-01-25 DIAGNOSIS — Z951 Presence of aortocoronary bypass graft: Secondary | ICD-10-CM | POA: Insufficient documentation

## 2022-01-25 DIAGNOSIS — H9203 Otalgia, bilateral: Secondary | ICD-10-CM

## 2022-01-25 DIAGNOSIS — M1711 Unilateral primary osteoarthritis, right knee: Secondary | ICD-10-CM | POA: Insufficient documentation

## 2022-01-25 DIAGNOSIS — R052 Subacute cough: Secondary | ICD-10-CM | POA: Insufficient documentation

## 2022-01-25 DIAGNOSIS — Z1231 Encounter for screening mammogram for malignant neoplasm of breast: Secondary | ICD-10-CM

## 2022-01-25 NOTE — Patient Instructions (Addendum)
Thank you for choosing [facility] for your care. You saw Ether Griffins, MD, MPH today at [facility]. Please call [phone] option #2 with any urgent questions or concerns.     Cough: please stop by x-ray on 1st floor for chest X-ray today  START pantoprazole in evening with your other 9 PM medication    Molten lava feeling in chest: cardiology follow up, I am going to check into whether you should see Dr Bridgett Larsson or Dr [name] who did your procedure    Right ear has wax    Hearing: audiology referral ordered June 15    Vaginal itching: let me know if gets worse    Acupuncture referral ordered. Please expect a long waiting period for an appointment, since there is 1 acupuncturist in our [place] system.

## 2022-01-25 NOTE — Progress Notes (Signed)
Ear Lavage Procedure Documentation:   After written order from provider, I irrigated the patient's ear(s) using body temperature (warm) water.    Which side did you treat?Right-sided    Did you put a softening agent in? (if yes, indicate the agent used): No     Did you visualize the eardrum afterwards? Yes, Ordering physician visualized Right ear after irrigation.    Did patient tolerate the procedure well? (if no, indicate any problems): Yes

## 2022-01-26 LAB — EKG 12 LEAD
Atrial Rate: 60 {beats}/min
P Axis: 61 degrees
P-R Interval: 232 ms
Q-T Interval: 428 ms
QRS Duration: 84 ms
QTC Calculation: 428 ms
R Axis: 63 degrees
T Axis: 144 degrees
Ventricular Rate: 60 {beats}/min

## 2022-01-29 ENCOUNTER — Encounter (HOSPITAL_BASED_OUTPATIENT_CLINIC_OR_DEPARTMENT_OTHER): Payer: Self-pay | Admitting: Internal Medicine

## 2022-01-29 NOTE — Telephone Encounter (Signed)
Called patient and left a voice message (pt did not answer). Notified patient that we need to schedule a f/u with Dr. Bridgett Larsson. Gave patient clinic number which is [phone].

## 2022-02-01 ENCOUNTER — Encounter (HOSPITAL_BASED_OUTPATIENT_CLINIC_OR_DEPARTMENT_OTHER): Payer: Self-pay

## 2022-02-02 ENCOUNTER — Other Ambulatory Visit (HOSPITAL_BASED_OUTPATIENT_CLINIC_OR_DEPARTMENT_OTHER): Payer: Self-pay | Admitting: Registered Nurse

## 2022-02-02 NOTE — Progress Notes (Signed)
Nurse Care Management Brief Note:  Patient enrolled & engaged in [facility] with RN care management and care coordination.    PCP: Debroah Loop, MD    Contacted Via: Telephone    Location: N/A    The language the patient speaks: [place]   Interpreter present: None.    Summary:     Referral to Audiology: The referral was placed by Dr. Ether Griffins on 11/23/21. The clinic attempted to call patient x 3. No appointment was made. This RN CM called the clinic. Scheduled on 05/23/22  Reason for referral: 76 year old female with hearing loss for audiology evaluation     Assessment/Plan:  Referral to Audiology: Schedule on 05/23/22.     Next Follow up:               03/22/2022 9:20 AM (Arrive by 9:05 AM) Debroah Loop, MD [facility]    03/22/2022 9:20 AM (Arrive by 9:05 AM) [facility]    05/23/2022 2:00 PM (Arrive by 1:45 PM) Thalia Party, AUD [facility]

## 2022-02-12 ENCOUNTER — Ambulatory Visit (HOSPITAL_BASED_OUTPATIENT_CLINIC_OR_DEPARTMENT_OTHER): Payer: Medicare HMO | Admitting: Psychiatry

## 2022-02-16 ENCOUNTER — Other Ambulatory Visit (HOSPITAL_BASED_OUTPATIENT_CLINIC_OR_DEPARTMENT_OTHER): Payer: Self-pay | Admitting: Registered Nurse

## 2022-02-16 NOTE — Progress Notes (Signed)
Nurse Care Management Brief Note:  Patient enrolled & engaged in [facility] with RN care management and care coordination.    PCP: Debroah Loop, MD    Contacted Via: Telephone    Location: N/A    The language the patient speaks: [place]   Interpreter present: None.    Summary:   Attempted to call patient to discuss below.     Screening Mammogram: The order was placed by Dr. Ether Griffins on 01/25/22. Will follow up.     Referral to Psychiatry/Senior care clinic: The referral was placed by Dr. Ether Griffins on 11/23/21. The clinic attempted to call patient x 1. No appointment was made. This RN CM will assist in scheduling an appointment.   REASON FOR REFERRAL: 76 year old female with mood symptoms, outside EHR reports bipolar disorder please clarify diagnosis and any recommended treatment.    BHIP: Had a Telemedicine visit with Arman Bogus MSW on 09/06/21. Next Telemedicine visit scheduled with Arman Bogus MSW on 09/20/21 which was cancelled. Patient is interested to join Dr. Ivin Booty Romm's depression and anxiety support group if it is via zoom. A Telemedicine visit was scheduled with Dr. Ivin Booty Room on 02/12/22. Patient was no show. No future appointment.     Cardiology: Had a telemedicine visit with Dr. Bryson Corona on 07/20/21. The plan was to follow up in 6 months (around 04/29/22). Had an appointment scheduled with Dr. Bridgett Larsson in [place] on 10/19/21 but it was cancelled. Follow up visit scheduled on 05/31/22.     Sleep Medicine clinic: Had a Telemedicine visit with Dr. Jenelle Mages for evaluation of OSA (obstructive sleep apnea) on 04/03/21. The plan was to have home sleep apnea testing, with f/u polysomnography. Follow up visit to discuss results. No future appointment.     Stroke clinic: Saw Dr. Floyde Parkins to follow up right temporoparietal ischemic stroke etiology cardioembolic on 10/11/52.  The follow up plan was to return in about 6 months (around 04/07/21). No future appointment. This RN CM called the  clinic. Follow up visit scheduled on 05/15/22.       Assessment/Plan:  Did not reach patient. A voicemail message was left requesting a call back.

## 2022-02-19 NOTE — Progress Notes (Signed)
Attempted to call  patient. A voicemail message was left requesting a call back to discuss appointment.     Follow up appointments: Will mail appointment itinerary to patient.               03/05/2022 11:00 AM (Arrive by 10:45 AM) [facility]    03/22/2022 9:20 AM (Arrive by 9:05 AM) Debroah Loop, MD [facility]    03/22/2022 9:20 AM (Arrive by 9:05 AM) [facility]    05/15/2022 1:30 PM (Arrive by 1:15 PM) Raylene Everts, MD [facility]    05/23/2022 2:00 PM (Arrive by 1:45 PM) Thalia Party, AUD [facility]    05/31/2022 2:30 PM (Arrive by 2:15 PM) Martyn Ehrich, MD [facility]    05/31/2022 2:30 PM (Arrive by 2:15 PM) [facility]

## 2022-03-05 ENCOUNTER — Ambulatory Visit: Payer: Medicare HMO | Attending: Registered" | Admitting: Registered"

## 2022-03-05 DIAGNOSIS — Z713 Dietary counseling and surveillance: Secondary | ICD-10-CM | POA: Insufficient documentation

## 2022-03-05 DIAGNOSIS — I25709 Atherosclerosis of coronary artery bypass graft(s), unspecified, with unspecified angina pectoris: Secondary | ICD-10-CM | POA: Insufficient documentation

## 2022-03-05 NOTE — Progress Notes (Signed)
Unable to reach pt by phone or telemed.      Happy to see her at another time if she wishes to reschedule.    Ed Blalock, MS, RD, CD, [facility]  Department mobile: [phone]  Email: [email or web address]

## 2022-03-09 ENCOUNTER — Encounter (HOSPITAL_BASED_OUTPATIENT_CLINIC_OR_DEPARTMENT_OTHER): Payer: Self-pay | Admitting: Internal Medicine

## 2022-03-09 ENCOUNTER — Other Ambulatory Visit (HOSPITAL_BASED_OUTPATIENT_CLINIC_OR_DEPARTMENT_OTHER): Payer: Self-pay | Admitting: Adult Health

## 2022-03-09 ENCOUNTER — Other Ambulatory Visit (HOSPITAL_BASED_OUTPATIENT_CLINIC_OR_DEPARTMENT_OTHER): Payer: Self-pay | Admitting: "Endocrinology

## 2022-03-09 DIAGNOSIS — I2510 Atherosclerotic heart disease of native coronary artery without angina pectoris: Secondary | ICD-10-CM

## 2022-03-09 MED ORDER — METOPROLOL SUCCINATE ER 50 MG OR TB24
50.0000 mg | EXTENDED_RELEASE_TABLET | Freq: Every day | ORAL | 0 refills | Status: DC
Start: 2022-03-09 — End: 2022-03-10

## 2022-03-10 MED ORDER — METOPROLOL SUCCINATE ER 50 MG OR TB24
EXTENDED_RELEASE_TABLET | ORAL | 0 refills | Status: DC
Start: 2022-03-10 — End: 2022-05-31

## 2022-03-10 NOTE — Telephone Encounter (Signed)
Resent RX

## 2022-03-22 ENCOUNTER — Telehealth (HOSPITAL_BASED_OUTPATIENT_CLINIC_OR_DEPARTMENT_OTHER): Payer: Self-pay | Admitting: Registered Nurse

## 2022-03-22 ENCOUNTER — Ambulatory Visit (HOSPITAL_BASED_OUTPATIENT_CLINIC_OR_DEPARTMENT_OTHER): Payer: Medicare HMO

## 2022-03-22 ENCOUNTER — Ambulatory Visit (HOSPITAL_BASED_OUTPATIENT_CLINIC_OR_DEPARTMENT_OTHER): Payer: Medicare HMO | Admitting: Internal Medicine

## 2022-03-22 ENCOUNTER — Other Ambulatory Visit (HOSPITAL_BASED_OUTPATIENT_CLINIC_OR_DEPARTMENT_OTHER): Payer: Self-pay | Admitting: Registered Nurse

## 2022-03-22 NOTE — Progress Notes (Signed)
Nurse Care Management Brief Note:  Patient enrolled & engaged in [facility] with RN care management and care coordination.    PCP: Debroah Loop, MD    Contacted Via: Telephone    Location: N/A    The language the patient speaks: [place]   Interpreter present: None.    Summary:   Patient was no show for the appointment with Dr. Ether Griffins this morning. She did not answer. A voicemail message was left requesting a call back to reschedule.

## 2022-03-22 NOTE — Telephone Encounter (Signed)
A message sent to patient via MyChart.

## 2022-03-22 NOTE — Progress Notes (Deleted)
Nurse Care Management Brief Note:  Patient enrolled & engaged in [facility] with RN care management and care coordination.    PCP: Debroah Loop, MD    Contacted Via: In person    Location: [facility]Senior care clinic    The language the patient speaks: [place]   Interpreter present: None.    Summary:   Patient is in clinic for a follow up visit with Dr. Ether Griffins.   Mobility: Ambulating independently with a 4 wheels upright walker.   Accompanied by: None    Screening Mammogram: The order was placed by Dr. Ether Griffins on 01/25/22. Will follow up.     Dexa scan: The order was placed by Dr. Ether Griffins on 01/25/22. Future for late 05/2022 or afterward. Scheduled on 06/06/22.    Referral to Acupuncture: The referral was placed by Dr. Ether Griffins on 01/25/22. In process. Will follow up.     Referral to Audiology: The referral was placed by Dr. Ether Griffins on 11/23/21. The clinic attempted to call patient x 3. No appointment was made. This RN CM called the clinic. Scheduled on 05/23/22  Reason for referral: 76 year old female with hearing loss for audiology evaluation    Referral to Psychiatry/Senior care clinic: The referral was placed by Dr. Ether Griffins on 11/23/21. The clinic attempted to call patient x 1. No appointment was made. This RN CM will assist in scheduling an appointment.   REASON FOR REFERRAL: 76 year old female with mood symptoms, outside EHR reports bipolar disorder please clarify diagnosis and any recommended treatment.    BHIP: Had a Telemedicine visit with Arman Bogus MSW on 09/06/21. Next Telemedicine visit scheduled with Arman Bogus MSW on 09/20/21 which was cancelled. Patient is interested to join Dr. Ivin Booty Romm's depression and anxiety support group if it is via zoom. A Telemedicine visit was scheduled with Dr. Ivin Booty Room on 02/12/22. Patient was no show. No future appointment.     Cardiology: Had a telemedicine visit with Dr. Bryson Corona on 07/20/21. The plan was to follow up in 6 months  (around 04/29/22). Had an appointment scheduled with Dr. Bridgett Larsson in [place] on 10/19/21 but it was cancelled. Follow up visit scheduled on 05/31/22.     [facility]: Had a Telemedicine visit with Dr. Marisa Sprinkles on 08/10/21.  No future appointment.     Nutrition: Saw Ed Blalock RD for hyperlipidemia on 08/24/21.  The plan was to follow up on 09/01/21 to focus on prevention of diabetes and fiber. Had a Telemedicine follow up visit scheduled on 09/01/21 which was cancelled. Rescheduled on 03/05/22. Santiago Glad was unable to reach patient by phone or telemed. May reschedule.      Psychiatry consult/Telemedicine group: Had an appointment with Dr. Ivin Booty Room on 02/12/22. Patient was no show. No future appointment.    Senior care clinic: Saw Dr. Ether Griffins today, 01/25/22. Follow up visit scheduled on 03/22/22.  After visit instructions on 01/25/22:  -Cough: please stop by x-ray on 1st floor for chest X-ray today  START pantoprazole in evening with your other 9 PM medication  -Molten lava feeling in chest: cardiology follow up, I am going to check into whether you should see Dr Bridgett Larsson or Dr [name] who did your procedure  -Right ear has wax  -Hearing: audiology referral ordered June 15  -Vaginal itching: let me know if gets worse   -Acupuncture referral ordered. Please expect a long waiting period for an appointment, since there is 1 acupuncturist in our [facility]  Medicine system.    Sleep Medicine clinic: Had a Telemedicine visit with Dr. Jenelle Mages for evaluation of OSA (obstructive sleep apnea) on 04/03/21. The plan was to have home sleep apnea testing, with f/u polysomnography. Follow up visit to discuss results. No future appointment.     Stroke clinic: Saw Dr. Floyde Parkins to follow up right temporoparietal ischemic stroke etiology cardioembolic on 02/22/77.  The follow up plan was to return in about 6 months (around 04/07/21). Follow up visit scheduled on 05/15/22.      Hypertension:   ***    Medications management:  Meds  refill:   Pharmacy: [facility]    Immunizations:   Covid: Had Moderna 10/04/20, 04/14/20, 08/11/19, 07/03/19. Had [facility] bivalent on 03/14/21.  Influenza:     PHQ-9:   Date: 08/09/21  Total score: 10    Katz Index of Independence in Activities of Daily Living:   Total Points: 5 (Incontinence of urine and BM sometimes. Wearing attends)  6=High  (patient independent)  0=Low (patient very dependent)    POLST: No record in chart.    DPOA for health care: An unsigned DPOA for health care scanned into chart.     Communication: By phone or text message.     Living situation: Lives alone in a senior housing.     Preference: Appointment prefer after 10AM. Prefer on Wed or Fri.    Support: Daughter, Hulan Saas (phone #: [phone]), Ernst Bowler (phone # [phone]). Patient gave verbal consent to contact daughter and son about patient's care.     Transportation: [employer] bus.    Assessment/Plan:   -Psychiatry consult/Telemedicine group  -Schedule Screening Mammogram   -Referral to Acupuncture  -Referral to Psychiatry  -BHIP f/u  -Nutrition f/u  -Sleep study  -Sleep medicine clinic f/u after sleep study.  -Influenza  -PHQ-9  -KATZ due 07/28/22    Will continue to provide support in RN Care management and care coordination.     Next Follow up:

## 2022-03-27 NOTE — Progress Notes (Signed)
Called patient. Rescheduled with Dr. Ether Griffins on 04/12/22. An appointment reminder for the appointment on 04/12/22 sent to patient via [facility] per patient's request. Will also mail appointment itinerary to patient.

## 2022-03-27 NOTE — Telephone Encounter (Signed)
Spoke with patient briefly this morning. See another Patient outreach encounter on 03/22/22 for details.

## 2022-03-27 NOTE — Progress Notes (Signed)
Called patient to reschedule an appointment with Dr. Ether Griffins. She was unable to speak with this RN CM at this time. She agreed to call back to reschedule but also requests a call back from this RN CM at [place] today.

## 2022-03-29 ENCOUNTER — Other Ambulatory Visit: Payer: Self-pay

## 2022-04-06 ENCOUNTER — Telehealth (HOSPITAL_BASED_OUTPATIENT_CLINIC_OR_DEPARTMENT_OTHER): Payer: Self-pay | Admitting: Internal Medicine

## 2022-04-06 NOTE — Telephone Encounter (Signed)
Due to a family emergency, provider Dr. Sharlynn Oliphant will be [place] 10/30 -11/10. Called and LVM informing pt of the need to reschedule and that her new appt is Thurs, Jan 18 at 10:30am. Stated that if she needs to be seen sooner for any reason to call the clinic back so we can schedule her w/ one of Dr. Allayne Gitelman colleagues. Direct CB# provided. Itinerary mailed.

## 2022-04-12 ENCOUNTER — Ambulatory Visit (HOSPITAL_BASED_OUTPATIENT_CLINIC_OR_DEPARTMENT_OTHER): Payer: Medicare HMO | Admitting: Internal Medicine

## 2022-04-12 ENCOUNTER — Ambulatory Visit (HOSPITAL_BASED_OUTPATIENT_CLINIC_OR_DEPARTMENT_OTHER): Payer: Medicare HMO

## 2022-05-03 ENCOUNTER — Encounter (HOSPITAL_BASED_OUTPATIENT_CLINIC_OR_DEPARTMENT_OTHER): Payer: Self-pay | Admitting: Internal Medicine

## 2022-05-09 ENCOUNTER — Other Ambulatory Visit (HOSPITAL_BASED_OUTPATIENT_CLINIC_OR_DEPARTMENT_OTHER): Payer: Self-pay | Admitting: Registered Nurse

## 2022-05-09 NOTE — Progress Notes (Signed)
Nurse Care Management Brief Note:  Patient enrolled & engaged in [facility] with RN care management and care coordination.    PCP: Debroah Loop, MD    Contacted Via: Telephone    Location: N/A    The language the patient speaks: [place]   Interpreter present: None.    Summary:   Called patient for a wellness check and regarding referrals. A message was left on her identified VM requesting a call back.     Assessment/Plan:  Did not reach patient. A VM was left.     Next Follow up:               05/15/2022 1:30 PM (Arrive by 1:15 PM) Raylene Everts, MD [facility]    05/23/2022 2:00 PM (Arrive by 1:45 PM) Thalia Party, AUD [facility]    05/31/2022 2:30 PM (Arrive by 2:15 PM) Martyn Ehrich, MD [facility]    05/31/2022 2:30 PM (Arrive by 2:15 PM) [facility]    06/06/2022 11:30 AM (Arrive by 11:00 AM) [facility]    06/28/2022 10:30 AM (Arrive by 10:15 AM) Debroah Loop, MD [facility]    06/28/2022 10:30 AM (Arrive by 10:15 AM) [facility]

## 2022-05-10 NOTE — Progress Notes (Signed)
Received a voicemail from patient who returned call to this RN CM. Patient was working through a series of bed bug problem. Felt overwhelmed with chores to take to get rid of them. Otherwise she was doing okay. Returned call to patient.     Has had problem with bed bug for 2-3 months. Her apartment manager and her kids have been helping her to get rid of them. Need to get rid of some furniture. Have some itching. Using vaseline lotion. Discussed OTC anti-itching cream or lotion. Discussed potential skin infection. Advised her to call back for questions or concerns or if any assistance is needed.     Confirmed the date, time & location of the appointment with Dr. Raylene Everts in Stroke clinic on 05/15/22.

## 2022-05-14 NOTE — Progress Notes (Signed)
[facility] - RETURN  Patient  ______________________________________________    HPI:  Wendy [name] is a 76 year old R hand dominant female with right temporoparietal ischemic stroke etiology cardioembolic (diagnosed afib at the time). She was last seen in [facility] stroke clinic by me on 10/06/2020.       She has a history of afib on apixaban, CAD s/p CABG and PCI, migraines with aura, parathyroidectomy 2019, and HTN. Since last visit, has had cardiac issues including PCI 07/17/2021 and 09/2021 but complicated by perforation. On apixaban and clopidogrel. Otherwise, reports doing well.  Lives in a senior living facility and recently had issues with bed bugs but that has been resolved. Denies any stroke like symptoms.      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] 09/19/2021     09/18/21 PCI attempt      CAD in native artery [I25.10] 09/18/2021    CAD S/P percutaneous coronary angioplasty [I25.10, Z98.61] 07/17/2021    Memory difficulties [R41.3] 04/06/2021     04/06/21 MoCA 27/30, abnormal Maze test  TSH 08/10/20 normal   Vitamin B-12 04/06/21 220      Stage 3a chronic kidney disease (HCC) [N18.31]     Dyspnea on exertion [R06.09] 12/28/2020     Added automatically from request for surgery [id number]      Other chest pain [R07.89] 12/23/2020     Added automatically from request for surgery [id number]      Chronic anticoagulation [Z79.01] 08/18/2020    DISH (diffuse idiopathic skeletal hyperostosis) [M48.10] 07/20/2020    Postmenopausal bleeding [N95.0] 07/15/2020     07/15/20 pelvic U/S endometrium thickened with cystic lesions thickness 18 mm, multiple fibroids      Paroxysmal atrial fibrillation (HCC) [I48.0] 07/11/2020    Abnormal glucose [R73.09] 06/23/2020     06/2020 HbA1c 5.7%  06/2021 HbA1c 5.8%      History of R MCA cerebrovascular accident  (CVA) with residual deficit [I69.30] 06/22/2020     06/22/20 R MCA cardioembolic CVA likely due to paroxysmal atrial fibrillation      Esophageal dysphagia [R13.19] 08/24/2019    Gastroesophageal reflux disease without esophagitis [K21.9] 08/24/2019    Body mass index 40.0-44.9, adult (HCC) [Z68.41] 07/16/2019    Primary osteoarthritis of right knee [M17.11] 07/16/2019    Hx of CABG [Z95.1]      CABG (2006.  LIMA-LAD, SVG-dRCA (with endarterectomy of RPDA and PL), SeqSVG-RI-D1, SVG-D3)   Cath 2021 with lesion in SVG-Diagonal s/p PCI, occluded graft to ramus/diagonal, disease at the bifurcation of RCA graft insertion managed medically at that time as sx improving.       Essential hypertension [I10] 06/08/2019    Asthma [J45.909] 06/08/2019    Bipolar disorder without psychotic features (HCC) [F31.9] 06/08/2019    Coronary artery disease involving coronary bypass graft of native heart with angina pectoris (HCC) [I25.709] 06/08/2019     Multiple vessel 2006  Abnormal DSE 10/2019  Cath 11/18/19 PCI SVG-diagonal  Clopidogrel through 11/2020  Cath 07/17/21 PCI SVG-PDA with 3.5X84mm Synergy DES  PCI 09/18/21 PCI attempt        Colon polyps [K63.5] 06/08/2019    Depression [F32.A] 06/08/2019    Generalized anxiety disorder [F41.1] 06/08/2019    Migraine [G43.909] 06/08/2019    Multinodular goiter [E04.2] 06/08/2019  07/15/20 thyroid U/S multiple thyroid nodules, R lobe one nodule TIRADS 4 --> per endocrine repeat U/S 1 year (07/2021)      Vitamin D deficiency [E55.9] 06/08/2019    Mixed hyperlipidemia [E78.2]      Cannot tolerate statins      Hyperparathyroidism (HCC) [E21.3]     Osteoporosis without current pathological fracture [M81.0]      07/16/19 25-OH vitamin D normal; known hyperparathyroidism      Nephrolithiasis [N20.0] 04/27/2019    Non-functioning R kidney [N28.9] 01/22/2019     Due to ureteral stone      Pulmonary nodule less than 6 cm determined by computed tomography of lung [IMO0001] 01/09/2019     R lung 5 mm nodule  incidental finding [facility] [place])      Abnormal computed tomography of pelvis [R93.5] 01/09/2019     Probable fibroid      Diastolic dysfunction [I51.89] 16/03/9603     Formatting of this note might be different from the original.   Normal left ventricular systolic function, ejection fraction 65 to 70%   Diastolic dysfunction - grade II (elevated filling pressures)   Dilated left atrium - mild   Degenerative mitral valve disease   Mitral annular calcification   Aortic stenosis - mild   Aortic regurgitation - moderate   Normal right ventricular systolic function    ECHO 2017      History of R parathyroidectomy (2017) [E89.2] 02/07/2016    Aortic stenosis, mild [I35.0] 01/08/2016     02/25/18 TTE Mild aortic stenosis with mild-moderate aortic regurgitation  06/25/20 TTE mild-moderate AS      Aortic regurgitation [I35.1] 01/08/2016     02/25/18 TTE mild-moderate      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 For allergies 90 tablet 3    clopidogrel 75 MG tablet Take 1 tablet (75 mg) by mouth daily. 30 tablet 11    cyanocobalamin (Vitamin B-12) 1000 MCG tablet Take 1 tablet (1,000 mcg) by mouth daily. For vitamin B-12 and memory 90 tablet 3    isosorbide mononitrate ER 60 MG 24 hr tablet TAKE ONE TABLET BY MOUTH EVERY MORNING 90 tablet 0    lisinopril 5 MG tablet Take 1 tablet (5 mg) by mouth every evening. For blood pressure 100 tablet 3    metoprolol succinate ER 50 MG 24 hr tablet TAKE ONE TABLET BY MOUTH ONE TIME DAILY 90 tablet 0       Allergies:  Review of  patient's allergies indicates:  Allergies   Allergen Reactions    Hydrochlorothiazide Skin: Itching and Throat Swelling     Other reaction(s): Throat Swelling    Lidocaine Other     Hallucinations    Penicillins Skin: Hives     Reaction in [place] (??)  Has patient had a PCN reaction causing immediate rash, facial/tongue/throat swelling, SOB or lightheadedness with hypotension: Yes  Has patient had a PCN reaction causing severe rash involving mucus membranes  or skin necrosis: No  Has patient had a PCN reaction that required hospitalization: No  Has patient had a PCN reaction occurring within the last 10 years: No  If all of the above answers are "NO", then may proceed with Cephalosporin use.      Coconut Fatty Acids      Other reaction(s): Other (See Comments)  Reaction??    Latex Skin: Itching    Losartan Headache and Other     Other reaction(s): Other (See Comments), Unknown  Pt doesn't remember.  High blood pressure    Statins Headache and Other     Other reaction(s): Other (See Comments), Other (See Comments) high blood pressure, ear ache and foggy thinking        Review of Systems:    Constitutional: NEGATIVE    Eyes: NEGATIVE    Ears, Nose, Mouth, Throat: NEGATIVE    Cardiovascular: NEGATIVE    Respiratory: NEGATIVE    Gastrointestinal: NEGATIVE    Genitourinary: NEGATIVE    Musculoskeletal: NEGATIVE    Skin: NEGATIVE     Neurological: As noted in HPI above   Psychiatric: NEGATIVE       Family History:  No cerebrovascular disease     Social History:  Residence/Occupation - retired  EtOH - denies  Tobacco - denies   Illicits - denies     EXAM  BP (!) 143/34   Pulse (!) 46   Temp 36.7 C (Temporal)   Ht 5' 0.98" (1.549 m)   Wt (!) 102.4 kg (225 lb 12.8 oz)   SpO2 96%   BMI 42.69 kg/m   Gen: Patient seated comfortably in chair, talkative, pleasant, in NAD.  Resp: non-labored   Skin: intact     MS: awake, alert, oriented to name, December 2023, and situation, speech fluent with no dysarthria, no  paraphasic errors. Comprehension intact to complex commands. Attention intact with ability to state days of week backwards. Able to ID objects. Able to calculate.    CN: Visual fields are full to confrontation, EOMI w/ no nystagmus, no diplopia, facial sensation intact, facial motor strength is full, hearing intact to voice, palate elevates symmetrically, shoulder shrug are intact, tongue midline.    Motor: normal bulk and tone.  No pronator drift, no orbiting.      L R   Delt 5 5   Tricep 5 5   Bicep 5 5   w/f 5 5   I/O 5 5   H/f 5 5   K/e 5 5   K/f 5 5   D/f 5 5     Sensation: Light touch intact throughout the upper and lower extremities.    Coordination: FNF intact with no dysmetria.    Movement: no abnormal movements noted.    Gait/stance: Posture is normal. Gait is steady and narrow based.    Work up  CTA H/N   1. CT head: Large area of loss of gray-white differentiation within the right temporoparietal region suspicious for acute infarct concerning for acute infarct. No evidence of hemorrhagic conversion.  2. CTA head: Multiple punctate calcified lesions noted through the left MCA artery, most notable within the right M2 branch with decreased density of contrast filling distally near the region of infarct, likely reflecting nonocclusive calcified thrombus. Additional diffuse multifocal luminal irregularities involving the right greater than intracranial vessels, likely secondary to atherosclerotic disease  3. CTA neck: No high-grade stenosis, dissection or occlusion.     MRI Brain   Mild cerebral volume  loss seen.  Numerous numerous punctate FLAIR hyperintensities in the periventricular and subcortical white matter consistent with microvascular ischemic change.  Diffuse T2 and FLAIR hyperintensity is noted in the right temporal lobe with evidence of restricted diffusivity on DWI. No hypointensities encountered on SWI to suggest hemorrhagic transformation.     TTE   1. The left ventricle is normal in size.  There is mild concentric increase in  the wall thickness of the left ventricle. Global left ventricular function is  normal (biplane EF 65%). The left ventricular average longitudinal peak  systolic strain is borderline (-17%). No regional wall motion abnormalities  are present.  2. The right ventricle is normal size. The right ventricular systolic  function is normal.  3. The aortic valve is mildly calcified. There is mild to moderate valvular  aortic stenosis (Vmax 2.9 m/s, MG 20 mmHg, AVA 1.4 cm2). Mild to moderate  aortic regurgitation is present.  4. Mild mitral annular calcification is present. The mitral annular  calcification is posterior.  5. Agitated saline contrast study was negative at rest, with Valsalva, and  with cough.  6. Pulmonary artery systolic pressure could not be estimated due to an  insufficient tricuspid regurgitant jet.  7. There is no pericardial effusion.     HbA1c 5.7%, fasting lipid panel T 283, TG 168, HDL 46, LDL 203, BNP 174      ASSESSMENT/RECOMMENDATIONS:  1. History of R MCA cerebrovascular accident (CVA) with residual deficit    2. Memory difficulties    In my opinion, Ms. Magallanes presents with right temporoparietal ischemic stroke etiology cardioembolic (newly diagnosed afib).  No further stroke work up is indicated. Defer to cardiology on the timing of clopidogrel use, asked patient and son to discuss at their next visit.  She will need to remain on anti-coagulation lifelong for stroke prevention.     We discussed secondary stroke prevention and modifiable risk factors. Goal blood pressure should be <130/80. Adequate glucose control to achieve a goal A1C value of less than 6.5%. We discussed the importance of not smoking and limiting alcohol consumption. She is on statin therapy for secondary stroke prevention. I encouraged healthy, well-balanced diet and at least 150 minutes of moderate physical activity a week.     The patient will continue follow up for surveillance and  treatment of their vascular risk factors with primary care provider. I have not scheduled a follow-up, but would be happy to see the patient again as needed. Thank you for allowing me to participate in the care of this very pleasant patient.     Jeannie Done, MD, MPH  Neurology/Stroke Attending Physician

## 2022-05-15 ENCOUNTER — Encounter (HOSPITAL_BASED_OUTPATIENT_CLINIC_OR_DEPARTMENT_OTHER): Payer: Self-pay | Admitting: Neurology

## 2022-05-15 ENCOUNTER — Ambulatory Visit: Payer: Medicare HMO | Attending: Neurology | Admitting: Neurology

## 2022-05-15 VITALS — BP 143/34 | HR 46 | Temp 98.1°F | Ht 60.98 in | Wt 225.8 lb

## 2022-05-15 DIAGNOSIS — I6930 Unspecified sequelae of cerebral infarction: Secondary | ICD-10-CM | POA: Insufficient documentation

## 2022-05-15 DIAGNOSIS — R413 Other amnesia: Secondary | ICD-10-CM | POA: Insufficient documentation

## 2022-05-23 ENCOUNTER — Ambulatory Visit: Payer: Medicare HMO | Attending: Audiologist | Admitting: Audiologist

## 2022-05-23 DIAGNOSIS — H9193 Unspecified hearing loss, bilateral: Secondary | ICD-10-CM | POA: Insufficient documentation

## 2022-05-23 DIAGNOSIS — H903 Sensorineural hearing loss, bilateral: Secondary | ICD-10-CM | POA: Insufficient documentation

## 2022-05-23 NOTE — Progress Notes (Unsigned)
AUDIOLOGY - CLINIC NOTE     SUBJECTIVE   {MR:[id number]::"Dr."} Wendy [name], a 89 yrs {man/woman:[id number]}, was seen in clinic for a comprehensive hearing evaluation. This patient is referred by Dr. Debroah Loop of ***. Primary concern is ***      ***intermittent tinnitus and imbalance worse in morning--using walker    This patient has not had her hearing formally evaluated as an adult.  The patient's most recent hearing test was completed on ***    ***Otologic history was otherwise unremarkable. Currently this patient is not experiencing any ear pain, pressure, fullness, or discharge. There is no history of: familial hearing loss, ear surgeries, significant head injuries, recent ear infections, or hazardous noise exposure. No tinnitus or dizziness noted.     ***    OBJECTIVE   Otoscopy revealed both ears to be free of excessive cerumen or debris and both tympanic membranes to be intact.  ***    Tympanometry was within normal limits bilaterally, and indicated proper function of the middle ear system. ***    Pure tone audiometry showed a ***     PTA was *** dBHL in the right ear, and *** dBHL in the left ear.    SRT was *** dBHL in the right ear, and *** dBHL in the left ear.    Speech MCL was measured as *** dBHL for the right ear and *** dBHL for the left ear. UCL was measured as *** dBHL for the right ear and *** dBHL for the left ear.    WRS (recorded W-22 lists***) were ***% at *** dBHL in the right ear, and ***% at *** dBHL in the left ear.    Please see the audiogram under the "media" tab in Epic for details.       ASSESSMENT   {MR:[id number]::"Dr."} Wendy [name], a 83 yrs {man/woman:[id number]}, was seen in clinic for a comprehensive hearing evaluation. The hearing test shows a ***    As compared to the most recent audiogram dated ***, today's results show ***    These results were shared with the patient, and she was given a copy for her records. ***    PLAN   1) F/U in 1 year or sooner as needed for annual review to r/o  progression of hearing loss. ***

## 2022-05-24 ENCOUNTER — Encounter (HOSPITAL_BASED_OUTPATIENT_CLINIC_OR_DEPARTMENT_OTHER): Payer: Self-pay | Admitting: Internal Medicine

## 2022-05-24 DIAGNOSIS — H903 Sensorineural hearing loss, bilateral: Secondary | ICD-10-CM | POA: Insufficient documentation

## 2022-05-31 ENCOUNTER — Ambulatory Visit: Payer: Medicare HMO | Attending: Cardiovascular Disease | Admitting: Cardiovascular Disease

## 2022-05-31 ENCOUNTER — Ambulatory Visit (HOSPITAL_BASED_OUTPATIENT_CLINIC_OR_DEPARTMENT_OTHER): Payer: Medicare HMO | Admitting: Registered Nurse

## 2022-05-31 ENCOUNTER — Other Ambulatory Visit (HOSPITAL_BASED_OUTPATIENT_CLINIC_OR_DEPARTMENT_OTHER): Payer: Self-pay | Admitting: Registered Nurse

## 2022-05-31 ENCOUNTER — Ambulatory Visit (HOSPITAL_COMMUNITY): Payer: Self-pay

## 2022-05-31 ENCOUNTER — Ambulatory Visit (HOSPITAL_BASED_OUTPATIENT_CLINIC_OR_DEPARTMENT_OTHER): Payer: Medicare HMO | Admitting: Nursing

## 2022-05-31 ENCOUNTER — Other Ambulatory Visit (HOSPITAL_BASED_OUTPATIENT_CLINIC_OR_DEPARTMENT_OTHER): Payer: Self-pay | Admitting: Cardiovascular Disease

## 2022-05-31 ENCOUNTER — Ambulatory Visit (HOSPITAL_BASED_OUTPATIENT_CLINIC_OR_DEPARTMENT_OTHER): Payer: Medicare HMO | Admitting: Geriatric Medicine

## 2022-05-31 ENCOUNTER — Other Ambulatory Visit (HOSPITAL_BASED_OUTPATIENT_CLINIC_OR_DEPARTMENT_OTHER): Payer: Self-pay

## 2022-05-31 ENCOUNTER — Other Ambulatory Visit (HOSPITAL_BASED_OUTPATIENT_CLINIC_OR_DEPARTMENT_OTHER): Payer: Self-pay | Admitting: Internal Medicine

## 2022-05-31 VITALS — BP 138/54 | HR 65 | Temp 97.2°F | Resp 16 | Wt 221.0 lb

## 2022-05-31 DIAGNOSIS — I2510 Atherosclerotic heart disease of native coronary artery without angina pectoris: Secondary | ICD-10-CM | POA: Insufficient documentation

## 2022-05-31 DIAGNOSIS — R7309 Other abnormal glucose: Secondary | ICD-10-CM | POA: Insufficient documentation

## 2022-05-31 DIAGNOSIS — R519 Headache, unspecified: Secondary | ICD-10-CM

## 2022-05-31 DIAGNOSIS — M26609 Unspecified temporomandibular joint disorder, unspecified side: Secondary | ICD-10-CM | POA: Insufficient documentation

## 2022-05-31 DIAGNOSIS — Z9861 Coronary angioplasty status: Secondary | ICD-10-CM | POA: Insufficient documentation

## 2022-05-31 LAB — SED RATE: Erythrocyte Sedimentation Rate: 5 mm/h (ref 0–20)

## 2022-05-31 MED ORDER — ISOSORBIDE MONONITRATE ER 60 MG OR TB24
60.0000 mg | EXTENDED_RELEASE_TABLET | Freq: Every morning | ORAL | 6 refills | Status: AC
Start: 2022-05-31 — End: ?

## 2022-05-31 MED ORDER — CLOPIDOGREL BISULFATE 75 MG OR TABS
75.0000 mg | ORAL_TABLET | Freq: Every day | ORAL | 6 refills | Status: DC
Start: 2022-05-31 — End: 2022-05-31
  Filled 2022-05-31: qty 30, 30d supply, fill #0

## 2022-05-31 MED ORDER — CLOPIDOGREL BISULFATE 75 MG OR TABS
75.0000 mg | ORAL_TABLET | Freq: Every day | ORAL | 6 refills | Status: DC
Start: 2022-05-31 — End: 2022-05-31

## 2022-05-31 MED ORDER — METOPROLOL SUCCINATE ER 50 MG OR TB24
50.0000 mg | EXTENDED_RELEASE_TABLET | Freq: Every day | ORAL | 6 refills | Status: AC
Start: 2022-05-31 — End: ?

## 2022-05-31 MED ORDER — CLOPIDOGREL BISULFATE 75 MG OR TABS
75.0000 mg | ORAL_TABLET | Freq: Every day | ORAL | 6 refills | Status: AC
Start: 2022-05-31 — End: ?

## 2022-05-31 MED ORDER — LISINOPRIL 5 MG OR TABS
5.0000 mg | ORAL_TABLET | Freq: Every evening | ORAL | 3 refills | Status: AC
Start: 2022-05-31 — End: ?

## 2022-05-31 NOTE — Patient Instructions (Signed)
Continue clopidogrel until February and then we will stop  Please stop by the lab for a blood draw  I will see you in 6 months

## 2022-05-31 NOTE — Progress Notes (Signed)
Patient called back.       Referral to [place]: The referral was placed by Dr. Ether Griffins on 01/25/22. Scheduled patient at [facility] on 08/13/22. They only see patient in the evening during weekday. Also see patient on Saturday from 8:00AM - 3PM. No opening on Saturday until in April. Patient needs to call her insurance to ensure acupuncture is covered. Confirmed the appointment with patient. She will call her insurance to see if they will cover acupuncture.     Referral to Psychiatry/Senior care clinic: The referral was placed by Dr. Ether Griffins on 11/23/21. The clinic attempted to call patient x 1. No appointment was made. Patient cannot come in on Tuesday or Thursday. Will let Dr. Ether Griffins know.   REASON FOR REFERRAL: 76 year old female with mood symptoms, outside EHR reports bipolar disorder please clarify diagnosis and any recommended treatment.     Screening Mammogram: The order was placed by Dr. Ether Griffins on 01/25/22. Patient prefers to have it done at [place]. The phone number [phone] given to patient. She plans to call them to schedule an appointment.

## 2022-05-31 NOTE — Progress Notes (Signed)
Performed MoCA w/o incident.  23/30

## 2022-05-31 NOTE — Progress Notes (Signed)
Cardiology Follow-up    CC: Follow-up CAD/PCI.    Interval/HISTORY OF PRESENT ILLNESS:  Since I last saw her Wendy [name] had an attempted PCI but this was not successful (Diagonals & RCA not attempted, 09/18/21).  Since then she says she's been generally feeling better so hasn't been interested in another cath.  No DOE, CP, LEE, palpitations, syncope.  She's had a headache/L jaw pain, w/ chewing etc.  She is to see Dr. Ronalee Red about this.    Past History: Wendy [name] is a 76 y/o W with h/o CAD, s/p CABG (2006.  LIMA-LAD, SVG-dRCA (with endarterectomy of RPDA and PL), SeqSVG-RI-D1, SVG-D3).  Back in 2006 she had DOE and was worked up for CAD, ultimately had a cath and then CABG.  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  She has a h/o statin intolerance.  More recently she has developed DOE and has been limiting her activity.  As a result her PCP, Dr. Ronalee Red, ordered a DSE.  This showed inducible ischemia and reproduced her symptoms.  This led to PCI of SVG-->D1.  This relieved her sx and allowed her to enjoy her dtrs wedding.  She did CR.  She then suffered a CVA and was dx on Afib and started on Apixaban.  I saw her in 07/2020 after this.      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]     Pseudoaneurysm following procedure ([place]) [T81.718A, I72.9] 09/19/2021     09/18/21 PCI attempt      CAD in native artery [I25.10] 09/18/2021    CAD S/P percutaneous coronary angioplasty [I25.10, Z98.61] 07/17/2021    Memory difficulties [R41.3] 04/06/2021     04/06/21 MoCA 27/30, abnormal Maze test  TSH 08/10/20 normal   Vitamin B-12 04/06/21 220      Stage 3a chronic kidney disease ([place]) [N18.31]     Dyspnea on exertion [R06.09] 12/28/2020     Added automatically from request for surgery [id number]      Other chest pain [R07.89] 12/23/2020      Added automatically from request for surgery [id number]      Chronic anticoagulation [Z79.01] 08/18/2020    DISH (diffuse idiopathic skeletal hyperostosis) [M48.10] 07/20/2020    Postmenopausal bleeding [N95.0] 07/15/2020     07/15/20 pelvic U/S endometrium thickened with cystic lesions thickness 18 mm, multiple fibroids      Paroxysmal atrial fibrillation ([place]) [I48.0] 07/11/2020    Abnormal glucose [R73.09] 06/23/2020     06/2020 HbA1c 5.7%  06/2021 HbA1c 5.8%      History of R MCA cerebrovascular accident (CVA) with residual deficit [I69.30] 06/22/2020     06/22/20 R MCA cardioembolic CVA likely due to paroxysmal atrial fibrillation      Esophageal dysphagia [R13.19] 08/24/2019    Gastroesophageal reflux disease without esophagitis [K21.9] 08/24/2019    Body mass index 40.0-44.9, adult ([place]) [Z68.41] 07/16/2019    Primary osteoarthritis of right knee [M17.11] 07/16/2019    Hx of CABG [Z95.1]      CABG (2006.  LIMA-LAD, SVG-dRCA (with endarterectomy of RPDA and PL), SeqSVG-RI-D1, SVG-D3)   Cath 2021 with lesion in SVG-Diagonal s/p PCI, occluded graft to ramus/diagonal, disease at the bifurcation of RCA graft insertion managed medically at that time as sx improving.       Essential hypertension [I10] 06/08/2019    Asthma [J45.909]  06/08/2019    Bipolar disorder without psychotic features ([place]) [F31.9] 06/08/2019    Coronary artery disease involving coronary bypass graft of native heart with angina pectoris ([place]) [I25.709] 06/08/2019     Multiple vessel 2006  Abnormal DSE 10/2019  Cath 11/18/19 PCI SVG-diagonal  Clopidogrel through 11/2020  Cath 07/17/21 PCI SVG-PDA with 3.5X34m Synergy DES  PCI 09/18/21 PCI attempt        Colon polyps [K63.5] 06/08/2019    Depression [F32.A] 06/08/2019    Generalized anxiety disorder [F41.1] 06/08/2019    Migraine [G43.909] 06/08/2019    Multinodular goiter [E04.2] 06/08/2019     07/15/20 thyroid U/S multiple thyroid nodules, R lobe one nodule TIRADS 4 --> per endocrine repeat U/S 1 year (07/2021)       Vitamin D deficiency [E55.9] 06/08/2019    Mixed hyperlipidemia [E78.2]      Cannot tolerate statins      Hyperparathyroidism ([place] [E21.3]     Osteoporosis without current pathological fracture [M81.0]      07/16/19 25-OH vitamin D normal; known hyperparathyroidism      Nephrolithiasis [N20.0] 04/27/2019    Non-functioning R kidney [N28.9] 01/22/2019     Due to ureteral stone      Pulmonary nodule less than 6 cm determined by computed tomography of lung [IMO0001] 01/09/2019     R lung 5 mm nod[facility]      Abnormal computed tomography of pelvis [R93.5] 01/09/2019     Probable fibroid      Diastolic dysfunction [[V40.98]08/17/2017     Formatting of this note might be different from the original.   Normal left ventricular systolic function, ejection fraction 65 to 711%  Diastolic dysfunction - grade II (elevated filling pressures)   Dilated left atrium - mild   Degenerative mitral valve disease   Mitral annular calcification   Aortic stenosis - mild   Aortic regurgitation - moderate   Normal right ventricular systolic function    ECHO 2017      History of R parathyroidectomy (2017) [E89.2] 02/07/2016    Aortic stenosis, mild [I35.0] 01/08/2016     02/25/18 TTE Mild aortic stenosis with mild-moderate aortic regurgitation  06/25/20 TTE mild-moderate AS      Aortic regurgitation [I35.1] 01/08/2016     02/25/18 TTE mild-moderate      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] 12/2015    History of R metatarsal fractures [Z87.81] 2017     2017 R 2nd, 3rd, 4th proximal metatarsals       ALL:  Review of patient's allergies indicates:  Allergies   Allergen Reactions    Hydrochlorothiazide Skin: Itching and Throat Swelling     Other reaction(s): Throat Swelling    Lidocaine Other     Hallucinations    Penicillins Skin: Hives     Reaction in [place](??)  Has patient had a PCN reaction causing immediate rash, facial/tongue/throat swelling, SOB or lightheadedness with hypotension:  Yes  Has patient had a PCN reaction causing severe rash involving mucus membranes or skin necrosis: No  Has patient had a PCN reaction that required hospitalization: No  Has patient had a PCN reaction occurring within the last 10 years: No  If all of the above answers are "NO", then may proceed with Cephalosporin use.      Coconut Fatty Acids      Other reaction(s): Other (See Comments)  Reaction??    Latex Skin: Itching    Losartan Headache and  Other     Other reaction(s): Other (See Comments), Unknown  Pt doesn't remember.  High blood pressure    Statins Headache and Other     Other reaction(s): Other (See Comments), Other (See Comments) high blood pressure, ear ache and foggy thinking      MEDS:  Outpatient Medications Prior to Visit   Medication Sig Dispense Refill    apixaban (Eliquis) 5 MG tablet TAKE ONE TABLET BY MOUTH TWICE DAILY FOR BLOOD THINNER FOR STROKE PREVENTION 180 tablet 3    cetirizine 10 MG tablet Take 1 tablet (10 mg) by mouth daily. For allergies 90 tablet 3    clopidogrel 75 MG tablet Take 1 tablet (75 mg) by mouth daily. 30 tablet 11    cyanocobalamin (Vitamin B-12) 1000 MCG tablet Take 1 tablet (1,000 mcg) by mouth daily. For vitamin B-12 and memory 90 tablet 3    isosorbide mononitrate ER 60 MG 24 hr tablet TAKE ONE TABLET BY MOUTH EVERY MORNING 90 tablet 0    lisinopril 5 MG tablet Take 1 tablet (5 mg) by mouth every evening. For blood pressure 100 tablet 3    metoprolol succinate ER 50 MG 24 hr tablet TAKE ONE TABLET BY MOUTH ONE TIME DAILY 90 tablet 0     No facility-administered medications prior to visit.     PHYSICAL EXAM:  Vitals:    05/31/22 1404   BP: (!) 138/54   BP Cuff Size: Regular Long   BP Site: Right Arm   BP Position: Sitting   Pulse: 65   Resp: 16   Temp: 36.2 C   TempSrc: Temporal   SpO2: 95%   Weight: (!) 100.2 kg (221 lb)     Gen: WD, WN woman, NAD  E: Anicteric  C: RRR s M, no LEE, JVP not elevated  R: Relaxed respirations  P: Normal M & A  S: No rashes    OTHER  DATA:    Results for orders placed or performed during the hospital encounter of 31/54/00   Basic Metabolic Panel   Result Value Ref Range    Sodium 136 135 - 145 meq/L    Potassium 3.9 3.6 - 5.2 meq/L    Chloride 104 98 - 108 meq/L    Carbon Dioxide, Total 27 22 - 32 meq/L    Anion Gap 5 4 - 12    Glucose 92 62 - 125 mg/dL    Urea Nitrogen 26 (H) 8 - 21 mg/dL    Creatinine 1.09 (H) 0.38 - 1.02 mg/dL    Calcium 8.6 (L) 8.9 - 10.2 mg/dL    eGFR by CKD-EPI 2021 53 (L) >59 mL/min/1.73_m2     DSE 11/03/2019:  1. Patient developed chest pain at peak stress, described to be similar to  the pain she felt before having her CABG, that resolved during recovery.   2. Baseline ECG shows normal sinus rhythm with nonspecific ST/T-wave changes.  At peak stress, there are inferior (III, aVF) ST elevations with reciprocal  ST depressions in leads I and aVL.   3. Baseline TTE shows normal LV size and systolic function (biplane EF 65%)  with no regional wall motion abnormalities. There is mild mitral annular  calcification, mild-moderate aortic stenosis, and mild-moderate aortic regurgitation.  4. At peak stress, there is hypokinesis to akinesis in the myocardial  segments as diagrammed.   5. In summary, there is ECG and echocardiographic evidence of inducible ischemia.     11/18/19:  Impression:  1.  Successful IVUS guided PCI of SVG-diagonal with 3.0(30) mm Resolute Onyx DES.  2. Diagnostic angiography demonstrating severe disease in SVG-diagonal and SVG-rPDA, occluded SVG-RI-diagonal, and patent LIMA-LAD.  3. Successful ultrasound guided 46F RFA access.  4. Perclose to the RFA.     Recommendations:  1. Patient initially loaded with ticagrelor 180 mg intraprocedure. Reloaded with 300 mg Plavix PO prior to discharge this evening. Continue Plavix 75 mg daily for at least 1 year.  2. Continue ASA 81 mg daily indefinitely.  3. Follow up visits with Dr. Bridgett Larsson  in 2 weeks to discuss further interventions (CTO PCI of ramus, CTO PCI of RCA) and  we can rediscuss with her if clinically indicated.    TTE 06/23/20:  1. The left ventricle is normal in size. There is mild concentric increase in the wall thickness of the left ventricle. Global left ventricular function is normal (biplane EF 65%). The left ventricular average longitudinal peak  systolic strain is borderline (-17%). No regional wall motion abnormalities are present.  2. The right ventricle is normal size. The right ventricular systolic function is normal.  3. The aortic valve is mildly calcified. There is mild to moderate valvular aortic stenosis (Vmax 2.9 m/s, MG 20 mmHg, AVA 1.4 cm2). Mild to moderate aortic regurgitation is present.  4. Mild mitral annular calcification is present. The mitral annular calcification is posterior.  5. Agitated saline contrast study was negative at rest, with Valsalva, and with cough.  6. Pulmonary artery systolic pressure could not be estimated due to an insufficient tricuspid regurgitant jet.  7. There is no pericardial effusion.     Compared with the baseline TTE from 11/03/2019, no significant changes are noted.    Cath 07/17/21:  NARRATIVE:  Left radial access was challenging. We were successful at engaging the LMCA (JL 3.5) and LIMA (IMT) and SVG-ramus-diagonal (AL1) via this access site. In order to engage RCA, SVG-diagonal and SVG- PDA, we had to use femoral access.     Left main coronary artery: Moderate caliber vessel which gives rise to a moderate caliber LAD and moderate caliber ramus. 40% ostial stenosis.   LAD: Moderate caliber vessel which gives rise to a 2 moderate caliber diagonal branches.  Notably a vein graft with a skip segment supplies the first diagonal. The second diagonal is supplied by another independent vein graft.    Ramus: Moderate caliber vessel which gives rise to several smaller branches. The vessel fills via the native circulation with a tubular 50% stenosis of the proximal vessel.   LCx: Anomalous vessel from the RCA. Small to moderate  caliber vessel with a 70% ostial stenosis and serial 40% stenoses.   RCA: Mid RCA CTO with epicardial collaterals from the Lcx.   SVG- RCA: Heavily disease with 90% tubular mid vessel disease. 80% disease just distal to the anastomosis.   LIMA-LAD: Widely patent and supplies the mid to distal LAD.  SVG- diagonal: Occluded. Epicardial collaterals are present from apical LAD to diagonal.   SVG-Ramus-Diagonal: Occluded    Occluded SVG  Occluded SVG-Ramus-Diagonal  90% stenosis SVG-PDA  Patent LIMA-LAD  LAD CTO     1. Unsuccessful RCA CTO PCI 2/2 fibrotic graft anastomosis   2. PCI SVG-PDA with 3.5X73m Synergy DES. Post dilated with 3.0NC.      RECOMMENDATIONS:   Admit for overnight monitoring  Continue therapeutic anticoagulation tomorrow evening  Plavix 75 mg daily for at least 12 months, consider long-term for secondary prevention  Aggressive medical management of CAD as  per the primary cardiologist  Return for RCA CTO PCI attempt in 1 month  Referral to cardiac rehab     09/18/21 Attempted PCI:    Unsuccessful PCI of first diagonal branch (attempted AWE, ADR, and RWE)    Complicated by Lissa Merlin 1 perforation, treated with balloon tamponade and protamine      IMPRESSION/RECOMMENDATIONS: 76 y/o W with CAD s/p CABG with recurrent anginal equivalent DOE and abnormal DSE now s/p PCI to SVG-->Diagonal on 11/18/19.  Unsuccessful PCI to D1.    CAD: Improved, walking more.  No changes in meds. Continue Clopidogrel until 07/2022.    HTN: Reasonable control (slightly high systolic but low diastolic).  No changes.    AS/AR: Both mild-mod on 06/23/20.  Next TTE in 2-3 years.    Lipids.  Statin intolerant.  Lipid clinic visit pending.    Sleep Clinic Eval. Pending.    Headache. Management per [facility].  ESR ordered (normal).    Follow-up 4-6 mo. Or sooner prn.

## 2022-05-31 NOTE — Progress Notes (Signed)
Nurse Care Management Brief Note:  Patient enrolled & engaged in [facility] with RN care management and care coordination.    PCP: Debroah Loop, MD    Contacted Via: In person    Location: [facility]/Senior care clinic    The language the patient speaks: [place]   Interpreter present: None.    Summary:   Patient is in clinic for a follow up visit with Dr. Bryson Corona. Patient states she has had pain and mild swelling below left ear lobe when she was chewing food x 3-4 weeks. Denies any fever, chills. No redness. Has not seen a dentist for a long time. Does not have a dentist that she can visit. Scheduled patient an acute visit with Dr. Eddie Candle for further evaluation.   Mobility: Ambulating independently with a 4 wheels upright walker.   Accompanied by: None    Screening Mammogram: The order was placed by Dr. Ether Griffins on 01/25/22. Patient prefers to have it done at [place]. The phone number [phone] given to patient. She plans to call them to schedule an appointment.     Dexa scan: The order was placed by Dr. Ether Griffins on 01/25/22. Future for late 05/2022 or afterward. Scheduled on 06/06/22.      Referral to [place]: The referral was placed by Dr. Ether Griffins on 01/25/22. Scheduled patient at [facility] on 08/13/22. They only see patient in the evening during weekday. Also see patient on Saturday from 8:00AM - 3PM. No opening on Saturday until in April. Patient needs to call her insurance to ensure acupuncture is covered. Confirmed the appointment with patient. She will call her insurance to see if they will cover acupuncture.    Referral to Psychiatry/Senior care clinic: The referral was placed by Dr. Ether Griffins on 11/23/21. The clinic attempted to call patient x 1. No appointment was made. Patient cannot come in on Tuesday or Thursday. Will let Dr. Ether Griffins know.   REASON FOR REFERRAL: 76 year old female with mood symptoms, outside EHR reports bipolar disorder please  clarify diagnosis and any recommended treatment.    Audiology clinic: Saw Dr. Susa Loffler for a comprehensive hearing evaluation on 05/23/22. The plan was to 1) F/U in 1 year or sooner as needed for annual review to r/o progression of hearing loss.     BHIP: Had a Telemedicine visit with Arman Bogus MSW on 09/06/21. Next Telemedicine visit scheduled with Arman Bogus MSW on 09/20/21 which was cancelled. Patient is interested to join Dr. Ivin Booty Romm's depression and anxiety support group if it is via zoom. A Telemedicine visit was scheduled with Dr. Ivin Booty Room on 02/12/22. Patient was no show. No future appointment.     Cardiology: Saw Dr. Bryson Corona on 05/31/22. The plan was to follow up in 6 months. Scheduled on 11/29/22.  After visit instructions:  Continue clopidogrel until February and then we will stop  Please stop by the lab for a blood draw  I will see you in 6 months    [facility]: Had a Telemedicine visit with Dr. Marisa Sprinkles on 08/10/21.  No future appointment.     Nutrition: Saw Ed Blalock RD for hyperlipidemia on 08/24/21.  The plan was to follow up on 09/01/21 to focus on prevention of diabetes and fiber. Had a Telemedicine follow up visit scheduled on 09/01/21 which was cancelled. Rescheduled on 03/05/22. Santiago Glad was unable to reach patient by phone or telemed. May reschedule.      Psychiatry  consult/Telemedicine group: Had an appointment scheduled with Dr. Ivin Booty Room on 02/12/22. Patient was no show. No future appointment.    Senior care clinic: Saw Dr. Ether Griffins on 01/25/22. Follow up visit scheduled on 03/22/22. Patient was no show. Rescheduled on 04/12/22 which was rescheduled on 06/28/22. Have an acute visit with Dr. Eddie Candle on 05/31/22.   After visit instructions on 05/31/22:  1)  Get a dental guard at [facility] to wear at night when asleep  2)  Soft diet until symptoms are better  3)  Find acupuncture in community    4)  Go to lab - we need to rule out temporal arteritis,  which can lead to blindness    Sleep Medicine clinic: Had a Telemedicine visit with Dr. Jenelle Mages for evaluation of OSA (obstructive sleep apnea) on 04/03/21. The plan was to have home sleep apnea testing, with f/u polysomnography. Follow up visit to discuss results. No future appointment.     Stroke clinic: Saw Dr. Floyde Parkins to follow up right temporoparietal ischemic stroke etiology cardioembolic on 16/1/09. Return if symptoms worsen or fail to improve.     Hypertension:   BP Readings from Last 3 Encounters:   05/31/22 (!) 138/54   05/15/22 (!) 143/34   01/25/22 130/86     Medications management:  Meds refill:   Pharmacy: [place]    Immunizations:   Covid: Had Moderna 10/04/20, 04/14/20, 08/11/19, 07/03/19. Had [place] bivalent on 03/14/21.  Influenza:     PHQ-9:   Date: 08/09/21  Total score: 10    Katz Index of Independence in Activities of Daily Living:   Total Points: 5 (Incontinence of urine and BM sometimes. Wearing attends)  6=High (patient independent)  0=Low (patient very dependent)    POLST: No record in chart.    DPOA for health care: An unsigned DPOA for health care scanned into chart.     Communication: By phone or text message.     Living situation: Lives alone in a senior housing.     Preference: Appointment prefer after 10AM. Prefer on Wed or Fri.    Support: Daughter, Hulan Saas (phone #: [phone]), Ernst Bowler (phone # [phone]). Patient gave verbal consent to contact daughter and son about patient's care.     Transportation: [employer] bus.    Assessment/Plan:   -Referral to Acupuncture  -Mammogram   -Referral to Psychiatry  -Psychiatry consult/Telemedicine group  -BHIP f/u  -Audiology clinic f/u due 05/24/23  -Sleep study  -Sleep medicine clinic f/u after sleep study.  -Nutrition f/u  -Covid-19 vaccine  -Influenza vaccine  -PHQ-9  -KATZ due 07/28/22  -POLST    Will continue to provide support in RN Care management and care coordination.     Next Follow up:               06/06/2022  11:30 AM (Arrive by 11:00 AM) [facility]    06/28/2022 10:30 AM (Arrive by 10:15 AM) Debroah Loop, MD [facility]    06/28/2022 10:30 AM (Arrive by 10:15 AM) [facility]    08/13/2022 6:00 PM (Arrive by 5:45 PM) [facility] at [facility]

## 2022-05-31 NOTE — Progress Notes (Addendum)
Nurse Care Management Brief Note:  Patient enrolled & engaged in [facility] with RN care management and care coordination.    PCP: Debroah Loop, MD    Contacted Via: Telephone    Location: N/A    The language the patient speaks: [place]   Interpreter present: None.    Summary:   Attempted to call patient to discuss below. She did not answer. A VM was left requesting a call back and also let her know that she has an appointment with Dr. Bryson Corona at 2:30PM today.     Referral to [place]: The referral was placed by Dr. Ether Griffins on 01/25/22. Scheduled patient at [facility] on 08/13/22. They only see patient in the evening during weekday. Also see patient on Saturday from 8:00AM - 3PM. No opening on Saturday until in April. Patient needs to call her insurance to ensure acupuncture is covered.     Referral to Psychiatry/Senior care clinic: The referral was placed by Dr. Ether Griffins on 11/23/21. The clinic attempted to call patient x 1. No appointment was made. This RN CM will assist in scheduling an appointment.   REASON FOR REFERRAL: 76 year old female with mood symptoms, outside EHR reports bipolar disorder please clarify diagnosis and any recommended treatment.    Screening Mammogram: The order was placed by Dr. Ether Griffins on 01/25/22. Will follow up.       Next Follow up:               05/31/2022 2:30 PM (Arrive by 2:15 PM) Martyn Ehrich, MD [facility]    05/31/2022 2:30 PM (Arrive by 2:15 PM) [facility]    06/06/2022 11:30 AM (Arrive by 11:00 AM) [facility]    06/28/2022 10:30 AM (Arrive by 10:15 AM) Debroah Loop, MD [facility]    06/28/2022 10:30 AM (Arrive by 10:15 AM) [facility]    08/13/2022 6:00 PM (Arrive by 5:45 PM) [facility] at [facility]

## 2022-05-31 NOTE — Patient Instructions (Addendum)
Good to see you today [name]    1)  Get a dental guard at [facility] to wear at night when asleep    2)  Soft diet until symptoms are better    3)  Find acupuncture in community      4)  Go to lab - we need to rule out temporal arteritis, which can lead to blindess    Wendy [name], M.D.

## 2022-05-31 NOTE — Progress Notes (Signed)
SENIOR CARE CLINIC RETURN VISIT NOTE    CC/ID:  Wendy [name] is a 76 yo community-dwelling woman with PMH of CAD s/p CABG and PCI, afib on chronic AC, GAD and MMP here for evaluation of L-sided jaw pain.      ASSESSMENT/PLAN:    Left TMJ disorder:  L-sided TMJ pain with radiation to temporal area, difficulty opening mouth, pain with chewing x 1 month.  No visual changes.  She has uneven bite and there is evidence of bruxism with erosion of the molars on the left which are likely contributing to L TMJ pain/dysfunction.    --ESR ordered by Dr. Chen returned normal, therefore GCA unlikely  --Use dental guard  --Soft food diet until improved  --Consider acupuncture  --Deep breathing exercises, aromatherapy, chamomile tea to help with relaxation/manage chronic anxiety     C. , M.D.  Gerontology & Geriatric Medicine    I spent a total of 20 minutes for the patient's care on the date of the service.  This time includes preparation pre and post the actual encounter.       SUBJECTIVE:  Accompanied by:  Self  Interpreter:  No    Wendy [name] reports L-sided jaw pain for the past month.  No trauma.  Pain centered around TMJ with radiation to L temporal area.  She denies any visual changes.  Has L-sided jaw pain with chewing and difficulty opening mouth.  No recent dental care or procedures.  No dental pain.  Poor dentition.  Has uneven bite.      PMH/Meds/Allergies reviewed/updated.    OBJECTIVE:  Vitals:     05/31/22 1404   BP: (!) 138/54   BP Cuff Size: Regular Long   BP Site: Right Arm   BP Position: Sitting   Pulse: 65   Resp: 16   Temp: 36.2 C   TempSrc: Temporal   SpO2: 95%   Weight: (!) 100.2 kg (221 lb)     General:  Pleasant, NAD  HEENT:  TTP L TMJ area.  Difficulty opening mouth.  Poor dentition.  L-sided molars are eroded.  No LAD.      LABS:  Recent labs reviewed.      IMAGING:  Recent imaging reviewed.

## 2022-06-01 LAB — HEMOGLOBIN A1C, HPLC: Hemoglobin A1C: 5.9 % (ref 4.0–6.0)

## 2022-06-05 DIAGNOSIS — R519 Headache, unspecified: Secondary | ICD-10-CM | POA: Insufficient documentation

## 2022-06-05 DIAGNOSIS — I2510 Atherosclerotic heart disease of native coronary artery without angina pectoris: Secondary | ICD-10-CM | POA: Insufficient documentation

## 2022-06-06 ENCOUNTER — Inpatient Hospital Stay
Admission: RE | Admit: 2022-06-06 | Discharge: 2022-06-06 | Disposition: A | Discharge status: Home / Self Care | Payer: Medicare HMO | Attending: Family Practice | Admitting: Family Practice

## 2022-06-06 DIAGNOSIS — M810 Age-related osteoporosis without current pathological fracture: Secondary | ICD-10-CM | POA: Insufficient documentation

## 2022-06-11 ENCOUNTER — Encounter (HOSPITAL_BASED_OUTPATIENT_CLINIC_OR_DEPARTMENT_OTHER): Payer: Self-pay | Admitting: Cardiovascular Disease

## 2022-06-12 ENCOUNTER — Other Ambulatory Visit (HOSPITAL_BASED_OUTPATIENT_CLINIC_OR_DEPARTMENT_OTHER): Payer: Self-pay | Admitting: Registered Nurse

## 2022-06-12 NOTE — Progress Notes (Signed)
Nurse Care Management Brief Note:  Patient enrolled & engaged in [facility] with RN care management and care coordination.    PCP: Debroah Loop, MD    Contacted Via: Telephone    Location: N/A    The language the patient speaks: [place]   Interpreter present: None.    Summary:   Continue to have pain below ear lobes, L>R. Has had pain in jaw when chewing food. No changes in pain since last visit with Dr. Eddie Candle on 05/31/22. No problem in swallowing meds. No changes in vision. Patient states she has not seen a dentist for a long time. Will mail [place] [facility] [phone] to patient.       Referral to Psychiatry/Senior care clinic: The referral was placed by Dr. Ether Griffins on 11/23/21. Scheduled a NEW Telemedicine visit with Dr. Frankey Shown on 06/26/22.   REASON FOR REFERRAL: 77 year old female with mood symptoms, outside EHR reports bipolar disorder please clarify diagnosis and any recommended treatment.    Assessment/Plan:  No changes in pain below ear lobes since last visit with Dr. Eddie Candle on 05/31/22. Has a f/u visit with Dr. Ether Griffins on 06/28/22. Advised patient to call back for a sooner appointment if pain worsen, changes in vision, fever or develops any new concerning symptoms. Patient was in agreement of the plan.     A New Telemedicine visit scheduled with Dr. Frankey Shown on 06/26/22.     Next Follow up:               06/26/2022 10:30 AM (Arrive by 10:15 AM) Darci Needle, MD [facility]    06/28/2022 10:30 AM (Arrive by 10:15 AM) Debroah Loop, MD [facility]    06/28/2022 10:30 AM (Arrive by 10:15 AM) [facility]    08/13/2022 6:00 PM (Arrive by 5:45 PM) [facility] at [facility]    11/29/2022 2:00 PM (Arrive by 1:45 PM) Martyn Ehrich, MD [facility]    11/29/2022 2:00 PM (Arrive by 1:45 PM) [facility]

## 2022-06-20 NOTE — Progress Notes (Signed)
No further appointments

## 2022-06-24 ENCOUNTER — Ambulatory Visit (HOSPITAL_BASED_OUTPATIENT_CLINIC_OR_DEPARTMENT_OTHER): Payer: Medicare HMO

## 2022-06-26 ENCOUNTER — Ambulatory Visit (HOSPITAL_BASED_OUTPATIENT_CLINIC_OR_DEPARTMENT_OTHER): Payer: Medicare HMO | Admitting: Psychiatry

## 2022-06-26 ENCOUNTER — Ambulatory Visit: Payer: Medicare HMO | Attending: Internal Medicine | Admitting: Psychiatry

## 2022-06-26 ENCOUNTER — Encounter (HOSPITAL_BASED_OUTPATIENT_CLINIC_OR_DEPARTMENT_OTHER): Payer: Self-pay | Admitting: Internal Medicine

## 2022-06-26 DIAGNOSIS — F39 Unspecified mood [affective] disorder: Secondary | ICD-10-CM

## 2022-06-27 NOTE — Telephone Encounter (Signed)
Pt saw Dr Georgette Dover yesterday, unable to read note as it is still open. Pt has a f/u appt tomorrow w/ Dr Ronalee Red as well as Cyndia Skeeters, CM forwarding message to Dr Ronalee Red

## 2022-06-27 NOTE — Telephone Encounter (Signed)
Nurse Care Management Brief Note:  Patient enrolled & engaged in [facility] with RN care management and care coordination.    PCP: Debroah Loop, MD    Contacted Via: Telephone    Location: N/A    The language the patient speaks: [place]   Interpreter present: None.    Summary:   Called patient to discuss her depression. Patient denies any history of depression. She went out for shopping yesterday. Then felt "exceeding depressed" after she went home. "Almost suicidal". She did not know why she felt that way. The depressed mood went away later yesterday. Denies feeling any depression today. Had a Telemedicine visit scheduled with Dr. Frankey Shown yesterday but she was late to attend the visit. Did not meet with Dr. Georgette Dover. Offered to reschedule but she declined. Agreed to reschedule it when she is in clinic tomorrow.     Developed pain in left ankle on Sunday. "Can't even walk to the bathroom." The pain has improved. Rates her pain 8-9/10.  Denies any swelling or redness. Denies any recent trauma. Had feet broken 3-4 years ago.      Assessment/Plan:  -Patient has a follow up visit with Dr. Ether Griffins tomorrow, 06/28/22. Patient will arrange ACCESS transportation.   -Will reschedule an appointment with Dr. Frankey Shown.     Next Follow up:

## 2022-06-28 ENCOUNTER — Ambulatory Visit (HOSPITAL_COMMUNITY): Payer: Self-pay

## 2022-06-28 ENCOUNTER — Encounter (HOSPITAL_BASED_OUTPATIENT_CLINIC_OR_DEPARTMENT_OTHER): Payer: Self-pay | Admitting: Internal Medicine

## 2022-06-28 ENCOUNTER — Ambulatory Visit: Payer: Medicare HMO | Attending: Internal Medicine | Admitting: Internal Medicine

## 2022-06-28 ENCOUNTER — Other Ambulatory Visit (HOSPITAL_BASED_OUTPATIENT_CLINIC_OR_DEPARTMENT_OTHER): Payer: Self-pay | Admitting: Internal Medicine

## 2022-06-28 ENCOUNTER — Ambulatory Visit (HOSPITAL_BASED_OUTPATIENT_CLINIC_OR_DEPARTMENT_OTHER): Payer: Medicare HMO | Admitting: Registered Nurse

## 2022-06-28 VITALS — BP 134/58 | HR 79 | Temp 97.2°F | Resp 18 | Wt 224.0 lb

## 2022-06-28 DIAGNOSIS — R419 Unspecified symptoms and signs involving cognitive functions and awareness: Secondary | ICD-10-CM | POA: Insufficient documentation

## 2022-06-28 DIAGNOSIS — I10 Essential (primary) hypertension: Secondary | ICD-10-CM | POA: Insufficient documentation

## 2022-06-28 DIAGNOSIS — E213 Hyperparathyroidism, unspecified: Secondary | ICD-10-CM | POA: Insufficient documentation

## 2022-06-28 DIAGNOSIS — I25709 Atherosclerosis of coronary artery bypass graft(s), unspecified, with unspecified angina pectoris: Secondary | ICD-10-CM

## 2022-06-28 DIAGNOSIS — F32A Depression, unspecified: Secondary | ICD-10-CM | POA: Insufficient documentation

## 2022-06-28 DIAGNOSIS — M25572 Pain in left ankle and joints of left foot: Secondary | ICD-10-CM | POA: Insufficient documentation

## 2022-06-28 DIAGNOSIS — R440 Auditory hallucinations: Secondary | ICD-10-CM | POA: Insufficient documentation

## 2022-06-28 DIAGNOSIS — F419 Anxiety disorder, unspecified: Secondary | ICD-10-CM | POA: Insufficient documentation

## 2022-06-28 LAB — PARATHYROID HORMONE: Parathyroid Hormone: 107 pg/mL — ABNORMAL HIGH (ref 12–88)

## 2022-06-28 LAB — LIPID PANEL
Cholesterol/HDL Ratio: 6.3
HDL Cholesterol: 39 mg/dL — ABNORMAL LOW (ref >39)
LDL Cholesterol, NIH Equation: 162 mg/dL — ABNORMAL HIGH (ref <130)
Non-HDL Cholesterol: 206 mg/dL — ABNORMAL HIGH (ref 0–159)
Total Cholesterol: 245 mg/dL — ABNORMAL HIGH (ref <200)
Triglyceride: 234 mg/dL — ABNORMAL HIGH (ref <150)

## 2022-06-28 LAB — LAB ADD ON ORDER

## 2022-06-28 LAB — BMP WITH REFLEXIVE IONIZED CA
Anion Gap: 7 (ref 4–12)
Calcium: 9.5 mg/dL (ref 8.9–10.2)
Carbon Dioxide, Total: 28 meq/L (ref 22–32)
Chloride: 106 meq/L (ref 98–108)
Creatinine: 1.2 mg/dL — ABNORMAL HIGH (ref 0.38–1.02)
Glucose: 123 mg/dL (ref 62–125)
Potassium: 4.2 meq/L (ref 3.6–5.2)
Sodium: 141 meq/L (ref 135–145)
Urea Nitrogen: 16 mg/dL (ref 8–21)
eGFR by CKD-EPI 2021: 47 mL/min/{1.73_m2} — ABNORMAL LOW (ref >59)

## 2022-06-28 LAB — TSH WITH REFLEXIVE FREE T4: TSH with Reflexive Free T4: 1.877 u[IU]/mL (ref 0.400–5.000)

## 2022-06-28 NOTE — Progress Notes (Signed)
SENIOR CARE SUBSEQUENT CARE VISIT NOTE     ID/CC:  77 yo community-dwelling woman with PMH of CAD s/p CABG and PCI, afib on chronic AC, GAD and MMP here for evaluation of L-sided jaw pain.      Interval History:  Baseline lives at [facility] apartment  Accompanied today by Lucianne Lei. Transportation via [employer] cabin crew or not: No     Concerns today:     - Last visit to [facility] on 05/31/2022 d/t L TMJ pain.    #L ankle pain  -Says unable to bear weight, denies known mechanism of injury, trauma. Denies edema, erythema.  -ranking pain 8-9/10. Says getting up is the most painful action and sleeping makes it better. Has not tried ice/heat.  - She does endorse improvement over the past few days and denies swelling, bruising, bleeding.  -Per chart, patient diagnosed w/ nondisplaced fracture of 3rd right metatarsal w/ complications. Says this injury occurred during a psychiatric hospitalization.     #Psychiatric  PHQ9 06/28/22= 25 (although patient says the question referring to her self esteem is her worst domain.)  GAD7: 52  -Says on Tuesday night was at grocery store when she suddenly felt a wave of SI and low mood "unlike anything I've felt." She says did not have a distinct suicide plan saying she was more angry than suicidal and that she "was [place] show 'em." That feeling quickly subsided in a matter of hours and she no longer endorses SI. She does endorse chronic low mood re: her self-esteem and feeling infantilized by her family at times. She denies any history of suicide attempts or SI. She says that her brother killed himself in the past and that she "would never do what he did". Was scheduled to meet with Dr. Georgette Dover, Psychiatry yesterday but was late an unable to attend the appt. She declined offer to reschedule with him, but open to rescheduling now. She is very concerned that her medications may be contributing to either and/or both her depressive symptoms and ankle pain. She endorses feeling "anxious" and describes the  experience as having more and and "like I want to move." She also describes a long h/o sleep struggles and describes some bizarre dreams recently, as well as feeling "manic-y" the other night, although she is unable to elucidate how she felt manic and said it was probably more like her experience of anxiety. She has continued to enjoy Tai chi and [place] [employer], recently started going to a new, more accepting church with her friend, enjoys seeing her family often, enjoys going to events with friends. She says, "see, I still know how to laugh!"     Patient denies experience of a a Major Depressive episode in the past, and does not endorse symptoms c/f mania. Upon questioning, she DOES endorse chronic, mild auditory hallucinations saying that she often filters unknown voices, music, etc out of white noise, background noise, etc. She denies VH. She c/o decreased appetite and says jovially that is likely because she has never enjoyed cooking. She denies hopelessness saying it is more like worthlessness. Patient endorses exquisite sensitivity to many pharm agents and says she has cycled through many BP meds ultimately landing on metoprolol '50mg'$  ER, which she currently takes.     She is linear, goal-oriented, appropriate rate of speech, eye contact.    Psy Hx:  Psych meds hx: Per chart, On and off olanzapine multiple times since 2017, depakote, trazodone, lithium   Fam psych history: Bipolar d/o in Mother  on [place], reports brother died by suicide  Psychiatric hospitalizations:  1x (12/02/15-01/01/16 at [facility] in [place], [place], [place] at outpatient clinic by Dr. Consuello Masse in [place], [place]) Bipolar spectrum w/ thoughts associated with hyperparathyroidism. She is s/p parathyroidectomy 01/26/16  -Per chart, h/o unipolar mood d/o diagnosed in 2006 (unclear how dx was made)  -Has received recent psychotherapy via referral to [facility]    #Cognition  -23/30 MoCA on 06/14/22  -Says she's dong well but may be noticed increased difficulties  thinking    #Cardiac  #CAD s/p CABG  -has been maintaining consistent contact with cardiology--> last seen 05/31/22, next appt in 6 month  -Denies new cardiac sx    Geriatric ROS:  ADLs: independent  IADLS: independent  Cognition: Not formally assessed, grossly intact verbally and to recent events, medications  Mood: "it just feels like too much"  Mobility: 4WW  Vision: Not assessed  Hearing:  not assessed      Problem List  Patient Active Problem List   Diagnosis    Nephrolithiasis    Hyperparathyroidism ([place])    Osteoporosis without current pathological fracture    Non-functioning R kidney    Essential hypertension    Aortic stenosis, mild    Asthma    Bipolar disorder without psychotic features ([place])    Coronary artery disease involving coronary bypass graft of native heart with angina pectoris ([place])    Colon polyps    Depression    Generalized anxiety disorder    Mixed hyperlipidemia    Migraine    History of R parathyroid adenoma    History of R parathyroidectomy (2017)    Multinodular goiter    Vitamin D deficiency    Hx of CABG    Aortic regurgitation    Pulmonary nodule less than 6 cm determined by computed tomography of lung    Abnormal computed tomography of pelvis    Body mass index 40.0-44.9, adult ([place])    History of R metatarsal fractures    Primary osteoarthritis of right knee    Esophageal dysphagia    Gastroesophageal reflux disease without esophagitis    Diastolic dysfunction    History of R MCA cerebrovascular accident (CVA) with residual deficit    Paroxysmal atrial fibrillation (HCC)    Postmenopausal bleeding    DISH (diffuse idiopathic skeletal hyperostosis)    Chronic anticoagulation    Other chest pain    Dyspnea on exertion    Memory difficulties    Stage 3a chronic kidney disease (HCC)    Abnormal glucose    CAD S/P percutaneous coronary angioplasty    CAD in native artery    Pseudoaneurysm following procedure ([place])    Sensorineural hearing loss (SNHL) of both ears    Coronary artery  disease involving native coronary artery of native heart without angina pectoris    Acute nonintractable headache        Outpatient Medication List:  Current Outpatient Medications   Medication Sig Dispense Refill    apixaban (Eliquis) 5 MG tablet TAKE ONE TABLET BY MOUTH TWICE DAILY FOR BLOOD THINNER FOR STROKE PREVENTION 180 tablet 3    cetirizine 10 MG tablet Take 1 tablet (10 mg) by mouth daily. For allergies 90 tablet 3    clopidogrel 75 MG tablet Take 1 tablet (75 mg) by mouth daily. 30 tablet 6    cyanocobalamin (Vitamin B-12) 1000 MCG tablet Take 1 tablet (1,000 mcg) by mouth daily. For vitamin B-12 and memory 90 tablet 3  isosorbide mononitrate ER 60 MG 24 hr tablet Take 1 tablet (60 mg) by mouth every morning. 90 tablet 6    lisinopril 5 MG tablet Take 1 tablet (5 mg) by mouth every evening. For blood pressure 100 tablet 3    metoprolol succinate ER 50 MG 24 hr tablet Take 1 tablet (50 mg) by mouth daily. 90 tablet 6     No current facility-administered medications for this visit.     Allergies:  Review of patient's allergies indicates:  Allergies   Allergen Reactions    Hydrochlorothiazide Skin: Itching and Throat Swelling     Other reaction(s): Throat Swelling    Lidocaine Other     Hallucinations    Penicillins Skin: Hives     Reaction in [place] (??)  Has patient had a PCN reaction causing immediate rash, facial/tongue/throat swelling, SOB or lightheadedness with hypotension: Yes  Has patient had a PCN reaction causing severe rash involving mucus membranes or skin necrosis: No  Has patient had a PCN reaction that required hospitalization: No  Has patient had a PCN reaction occurring within the last 10 years: No  If all of the above answers are "NO", then may proceed with Cephalosporin use.      Coconut Fatty Acids      Other reaction(s): Other (See Comments)  Reaction??    Latex Skin: Itching    Losartan Headache and Other     Other reaction(s): Other (See Comments), Unknown  Pt doesn't  remember.  High blood pressure    Statins Headache and Other     Other reaction(s): Other (See Comments), Other (See Comments) high blood pressure, ear ache and foggy thinking         Social History:  Lives: Moved to [place] in Oct 2020 from [place]  Marital Status: Divorced  Children: 2 adult children and grandson in [place] isolation/Loneliness: Do you feel lonely? No Do you feel socially connected? Yes    Substance Use: Denies/ Alcohol: Denies /Cigarettes: Denies  Lethal means: Denies access; denies intent to harm self  Nutrition: Cooks for self  Health Literacy: Poor  Occupation: Currently retired    Physical Exam:  BP (!) 134/58 Comment: denies HA, denies dizziness, AOx3, took BP med today  Pulse 79   Temp 36.2 C (Temporal)   Resp 18   Wt (!) 101.6 kg (224 lb)   SpO2 99% Comment: room air  BMI 42.35 kg/m   Wt Readings from Last 3 Encounters:   06/28/22 (!) 101.6 kg (224 lb)   05/31/22 (!) 100.2 kg (221 lb)   05/15/22 (!) 102.4 kg (225 lb 12.8 oz)     GEN: Obese woman, well kept, sitting in exam chair  Psych: PHQ-9= 25; GAD-7= 16  Mini-Cog/MoCA: recent MoCA 23/30 on 05/31/22  HEENT: NAD  Eyes: Good eye contact, EOMI, no icterus  CARDIOVASC: RRR, no murmurs appreciated, nl rate  CHEST/PULM: nl effort, CTAB  ABD/GI: protuberant, not assessed  MUSCULOSKEL: Tenderness along right anterior ankle joint to palpation. No evidence of trauma, no bruising, no edema, no erythema, no wounds; Limited range of motion, but able to independently move foot in all directions with little discomfort; endorses incrementally more pain with dorsiflexion aexternal rotation.     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    This patient presents w/ c/o sudden, intermittent SI without a plan or intention to harm  herself. She has been struggling with self-worth since her move to [place] and expresses struggles with fitting in with her peers and feelings of guilt surrounding her family and their perceptions of her. Concerning that she scored 25 on PHQ-9, although further investigation with the patient does not feel wholly consistent with her answers. Also notable that she endorsed being able to filter voices out of background noise. She does not currently meet criteria for a primary mood disorder although further w/u is necessary to delineate potential medication or disease state contributions vs primary psychiatric illness. Patient missed intake with Dr. Georgette Dover, psychiatry yesterday but does want to try to reschedule, which we are glad to assist. Will plan to defer possible psych medication management to OP psychiatry team at this time.    Her left ankle does not appear to be acutely injured on exam, with no evidence of recent trauma, edema, etc. As patient has elevated BMI, there is some concern for pathologic fracture, however that is less likely per PE findings of anteromedial tenderness at mortis joint, full RoM. Most likely diagnosis is tendinitis vs anterior tib-fib tendon strain. Additionally, history is inconsistent with traumatic fracture; patient non-weightbearing on Tuesday and now able to ambulate independently. Will continue to monitor, recommend patient utilize ice/heat for acute relief. Patient to come back to clinic if symptoms get worse or do not remit.    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 Georgette Dover, as rescheduled   - Call, text [phone] if depressive symptoms get worse. Go the ED if considering harming self.   - Will plan to obtain Lipid panel, TSH, BMP, PTH and f/u w/ results as those return.    #L ankle pain   -No echhymosis, edema, erythema, trauma, no weakness, full RoM.    - Conservative management with ice/heat,  tylenol   - Return to clinic if symptoms evolve, plateau    #CAD s/p CABG  - No new concerns today, denies CP, benign PE  - Plan to obtain and f/u assessment labs as above      Immunization History   Administered Date(s) Administered    COVID-19 Moderna mRNA monovalent 12 yrs and older 07/03/2019, 08/11/2019, 04/14/2020, 10/04/2020    COVID-19 Pfizer mRNA bivalent 12 yrs and older 03/14/2021    Influenza quadrivalent MDCK PF (Flucelvax) 04/18/2019    Influenza quadrivalent PF 07/11/2017    Influenza quadrivalent adjuvanted (Fluad) 03/17/2020, 03/14/2021    Influenza trivalent high-dose 03/04/2018    Pneumococcal conjugate PCV13 (Prevnar 13) 05/21/2014    Pneumococcal polysaccharide PPSV23 (Pneumovax 23) 11/14/2012    Tdap 11/14/2012                 07/17/2022 10:30 AM (Arrive by 10:15 AM) Darci Needle, MD [facility]    07/17/2022 10:30 AM (Arrive by 10:15 AM) [facility]    08/13/2022 6:00 PM (Arrive by 5:45 PM) [facility] at [facility]    11/29/2022 2:00 PM (Arrive by 1:45 PM) Martyn Ehrich, MD [facility]    11/29/2022 2:00 PM (Arrive by 1:45 PM) [facility]

## 2022-06-28 NOTE — Progress Notes (Signed)
Wendy [name] did not cancel and was not present for a scheduled appointment today.  Disposition: New patient to clinic, no prior visits or history, no follow-up planned      [name] did not cancel and was not present for a scheduled appointment today.  Disposition: New patient to clinic, no prior visits or history, no follow-up planned

## 2022-06-28 NOTE — Progress Notes (Signed)
Nurse Care Management Brief Note:  Patient enrolled & engaged in [facility] with RN care management and care coordination.    PCP: Debroah Loop, MD    Contacted Via: In person    Location: [facility]/Senior care clinic    The language the patient speaks: [place]   Interpreter present: None.    Summary:   Patient is in clinic for a follow up visit with Dr. Ether Griffins.    Accompanied by: None.  Mobility: Ambulating with an upright 4 wheels walker independently.    Screening Mammogram: The order was placed by Dr. Ether Griffins on 01/25/22. Patient prefers to have it done at [place]. The phone number [phone] given to patient. She plans to call them to schedule an appointment.     Referral to [place]: The referral was placed by Dr. Ether Griffins on 01/25/22. Scheduled patient at [facility] on 08/13/22. They only see patient in the evening during weekday. Also see patient on Saturday from 8:00AM - 3PM. No opening on Saturday until in April. Patient needs to call her insurance to ensure acupuncture is covered. Confirmed the appointment with patient. She will call her insurance to see if they will cover acupuncture.    Referral to Psychiatry/Senior care clinic: The referral was placed by Dr. Ether Griffins on 11/23/21. Scheduled a NEW Telemedicine visit with Dr. Frankey Shown on 06/26/22. She was late to attend the visit. Rescheduled an in-person visit on 07/17/22.   REASON FOR REFERRAL: 77 year old female with mood symptoms, outside EHR reports bipolar disorder please clarify diagnosis and any recommended treatment..    Audiology clinic: Saw Dr. Susa Loffler for a comprehensive hearing evaluation on 05/23/22. The plan was to 1) F/U in 1 year or sooner as needed for annual review to r/o progression of hearing loss.     BHIP: Had a Telemedicine visit with Arman Bogus MSW on 09/06/21. Next Telemedicine visit scheduled with Arman Bogus MSW on 09/20/21 which was cancelled. Patient is interested to  join Dr. Ivin Booty Romm's depression and anxiety support group if it is via zoom. A Telemedicine visit was scheduled with Dr. Ivin Booty Room on 02/12/22. Patient was no show. No future appointment.     Cardiology: Saw Dr. Bryson Corona on 05/31/22. The plan was to follow up in 6 months. Scheduled on 11/29/22.  After visit instructions:  Continue clopidogrel until February and then we will stop  Please stop by the lab for a blood draw  I will see you in 6 months    [facility]: Had a Telemedicine visit with Dr. Marisa Sprinkles on 08/10/21.  No future appointment.     Nutrition: Saw Ed Blalock RD for hyperlipidemia on 08/24/21.  The plan was to follow up on 09/01/21 to focus on prevention of diabetes and fiber. Had a Telemedicine follow up visit scheduled on 09/01/21 which was cancelled. Rescheduled on 03/05/22. Santiago Glad was unable to reach patient by phone or telemed. May reschedule.      Senior care clinic: Saw Dr. Ether Griffins today, 06/28/22. No future appointment.     Sleep Medicine clinic: Had a Telemedicine visit with Dr. Jenelle Mages for evaluation of OSA (obstructive sleep apnea) on 04/03/21. The plan was to have home sleep apnea testing, with f/u polysomnography. Follow up visit to discuss results. No future appointment.     Stroke clinic: Saw Dr. Floyde Parkins to follow up right temporoparietal ischemic stroke etiology cardioembolic on 07/17/83. Return if symptoms worsen or fail to improve.  Hypertension:   BP Readings from Last 3 Encounters:   06/28/22 (!) 134/58   05/31/22 (!) 138/54   05/15/22 (!) 143/34     Medications management:  Meds refill:   Pharmacy: [place]    Immunizations:   Covid: Had Moderna 10/04/20, 04/14/20, 08/11/19, 07/03/19. Had [place] bivalent on 03/14/21. Had a Covid vaccine in early Nov, 2023 from where she lives per patient.   Influenza: Had a Influenza vaccine in early Nov, 2023 from where she lives per patient.     PHQ-9:   Date: 06/28/22  Total score: 25. Dr. Ether Griffins  informed. Has an appointment with Dr. Frankey Shown on 07/17/22.     Ron Parker Index of Independence in Activities of Daily Living:   Date: 06/28/22  Total Points: 5 (Incontinence of urine. Wearing attends)  6=High (patient independent)  0=Low (patient very dependent)    POLST: No record in chart.  DPOA for health care: An unsigned DPOA for health care scanned into chart.     Communication: By phone or text message.     Living situation: Lives alone in a senior housing.     Preference: Appointment prefer after 10AM. Prefer on Wed or Fri.    Support: Daughter, Hulan Saas (phone #: [phone]), Ernst Bowler (phone # [phone]). Patient gave verbal consent to contact daughter and son about patient's care.     Transportation: [employer] [facility].    Assessment/Plan:   -Referral to Acupuncture  -Mammogram   -SCC f/u  -Audiology clinic f/u due 05/24/23  -Home sleep apnea study  -Sleep medicine clinic f/u after sleep study.  -PHQ-9 due 09/27/22  -KATZ  due 12/27/22  -POLST    Will continue to provide support in RN Care management and care coordination.     Next Follow up:               07/17/2022 10:30 AM (Arrive by 10:15 AM) Darci Needle, MD [facility]    07/17/2022 10:30 AM (Arrive by 10:15 AM) [facility]    08/13/2022 6:00 PM (Arrive by 5:45 PM) [facility]    11/29/2022 2:00 PM (Arrive by 1:45 PM) Martyn Ehrich, MD [facility]    11/29/2022 2:00 PM (Arrive by 1:45 PM) [facility]

## 2022-07-03 ENCOUNTER — Other Ambulatory Visit (HOSPITAL_BASED_OUTPATIENT_CLINIC_OR_DEPARTMENT_OTHER): Payer: Self-pay | Admitting: Internal Medicine

## 2022-07-03 DIAGNOSIS — N289 Disorder of kidney and ureter, unspecified: Secondary | ICD-10-CM

## 2022-07-03 DIAGNOSIS — I10 Essential (primary) hypertension: Secondary | ICD-10-CM

## 2022-07-03 DIAGNOSIS — N1831 Chronic kidney disease, stage 3a: Secondary | ICD-10-CM

## 2022-07-03 NOTE — Result Encounter Note (Signed)
Doesn't tolerate statins. Previously referred to lipid clinic.

## 2022-07-03 NOTE — Result Encounter Note (Signed)
TSH within normal limits

## 2022-07-03 NOTE — Result Encounter Note (Signed)
Known CKD stage 3a will continue monitoring renal function. Known hyperparathyroidism stable.

## 2022-07-10 ENCOUNTER — Other Ambulatory Visit (HOSPITAL_BASED_OUTPATIENT_CLINIC_OR_DEPARTMENT_OTHER): Payer: Self-pay | Admitting: Internal Medicine

## 2022-07-10 DIAGNOSIS — M810 Age-related osteoporosis without current pathological fracture: Secondary | ICD-10-CM

## 2022-07-17 ENCOUNTER — Ambulatory Visit (HOSPITAL_BASED_OUTPATIENT_CLINIC_OR_DEPARTMENT_OTHER): Payer: Medicare HMO | Admitting: Registered Nurse

## 2022-07-17 ENCOUNTER — Ambulatory Visit: Payer: Medicare HMO | Attending: Psychiatry | Admitting: Psychiatry

## 2022-07-17 DIAGNOSIS — F32A Depression, unspecified: Secondary | ICD-10-CM | POA: Insufficient documentation
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
## 2022-07-17 NOTE — Progress Notes (Signed)
Nurse Care Management Brief Note:  Patient enrolled & engaged in [facility] with RN care management and care coordination.    PCP: Debroah Loop, MD    Contacted Via: In person    Location: [facility]/Senior care clinic    The language the patient speaks: [place]   Interpreter present: None.    Summary:   Patient is in clinic for a NEW visit with Dr. Frankey Shown.  Accompanied by: None.  Mobility: Ambulating with an upright 4 wheels walker independently.    Screening Mammogram: The order was placed by Dr. Ether Griffins on 01/25/22. Patient prefers to have it done at [facility]. The phone number [phone] given to patient x 2. She plans to call them to schedule an appointment.     Referral to [facility]: The referral was placed by Dr. Ether Griffins on 01/25/22. Scheduled patient at [facility] on 08/13/22. They only see patient in the evening during weekday. Also see patient on Saturday from 8:00AM - 3PM. No opening on Saturday until in April. Patient needs to call her insurance to ensure acupuncture is covered. Confirmed the appointment with patient. She will call her insurance to see if they will cover acupuncture.    Audiology clinic: Saw Dr. Susa Loffler for a comprehensive hearing evaluation on 05/23/22. The plan was to 1) F/U in 1 year or sooner as needed for annual review to r/o progression of hearing loss.     BHIP: Had a Telemedicine visit with Arman Bogus MSW on 09/06/21. Next Telemedicine visit scheduled with Arman Bogus MSW on 09/20/21 which was cancelled. Patient is interested to join Dr. Ivin Booty Romm's depression and anxiety support group if it is via zoom. A Telemedicine visit was scheduled with Dr. Ivin Booty Room on 02/12/22. Patient was no show. No future appointment.     Cardiology: Saw Dr. Bryson Corona on 05/31/22. The plan was to follow up in 6 months. Scheduled on 11/29/22.    [facility]: Had a Telemedicine visit with Dr. Marisa Sprinkles on 08/10/21.  No future appointment.      Nutrition: Saw Ed Blalock RD for hyperlipidemia on 08/24/21.  The plan was to follow up on 09/01/21 to focus on prevention of diabetes and fiber. Had a Telemedicine follow up visit scheduled on 09/01/21 which was cancelled. Rescheduled on 03/05/22. Santiago Glad was unable to reach patient by phone or telemed. May reschedule.      Psychiatry/Senior care clinic: Saw Dr. Frankey Shown for evaluation of depression x 1 week on 07/17/22.   The plan was:   - No medications indicated  - No acute psychotherapy needs; patient could pursue community psychotherapy if interested  - Pt can f/u with this clinic for worsening psychiatric symptoms    Senior care clinic: Saw Dr. Ether Griffins on1/18/24. Follow up visit scheduled on 09/06/22.      Sleep Medicine clinic: Had a Telemedicine visit with Dr. Jenelle Mages for evaluation of OSA (obstructive sleep apnea) on 04/03/21. The plan was to have home sleep apnea testing, with f/u polysomnography. Follow up visit to discuss results. No future appointment. Spoke with patient regarding scheduling an appointment for home sleep apnea testing. Patient declined. She would like to speak her daughter about the test. She will call this RN CM when she is ready to schedule an appointment.     Stroke clinic: Saw Dr. Floyde Parkins to follow up right temporoparietal ischemic stroke etiology cardioembolic on 01/15/75. Return if symptoms worsen or fail to improve.  Hypertension:   BP Readings from Last 3 Encounters:   06/28/22 (!) 134/58   05/31/22 (!) 138/54   05/15/22 (!) 143/34     Medications management:  Meds refill:   Pharmacy: [facility]    Immunizations:   Covid: Per patient, she had a Covid vaccine in early Nov, 2023 from where she lives.   Influenza: Per patient, she had an Influenza vaccine in early Nov, 2023 from where she lives.     PHQ-9:   Date: 06/28/22  Total score: 25. Dr. Ether Griffins informed. Has an appointment with Dr. Frankey Shown on 07/17/22.     Ron Parker Index of Independence  in Activities of Daily Living:   Date: 06/28/22  Total Points: 5 (Incontinence of urine. Wearing attends)  6=High (patient independent)  0=Low (patient very dependent)    POLST: No record in chart.  DPOA for health care: An unsigned DPOA for health care scanned into chart.     Communication: By phone or text message.     Living situation: Lives alone in a senior housing.     Preference: Appointment prefer after 10AM. Prefer on Wed or Fri.    Support: Daughter, Hulan Saas (phone #: [phone]), Ernst Bowler (phone # [phone]). Patient gave verbal consent to contact daughter and son about patient's care.     Transportation: [employer] bus.    Assessment/Plan:   -Lab ~ 10/02/22  -Mammogram   -Audiology clinic f/u due 05/24/23  -Home sleep apnea testing, with f/u polysomnography  -Sleep medicine clinic f/u after sleep study.  -PHQ-9 due 09/27/22  -KATZ  due 12/27/22  -POLST    Will continue to provide support in RN Care management and care coordination.     Next Follow up:

## 2022-07-18 ENCOUNTER — Ambulatory Visit (HOSPITAL_BASED_OUTPATIENT_CLINIC_OR_DEPARTMENT_OTHER): Payer: Self-pay | Admitting: Pharmacist

## 2022-07-18 NOTE — Progress Notes (Signed)
Osteoporosis review   77 year old pt referred for review/assessment of osteoporosis tx.     Last DEXA 05/2020: Tspine-2.0, Thip -0.2, Tfem neck-1.9- no change since last DEXA  FRAX score: 17 % 10 yr mj fx probability, 3.7%  10 yr hip fx probability   Hx of fx: right foot fx in last 6 months - ( in hospital admission)  Hx of falls: yes  Past Medications used: ---  Labs: 09/2021 SCr: 1.09 Ca: 8.6 (some mention of hypercalcemia in 2017) , Tot D: 28.9 in 2021   Wt: 101.6kg     Future dental procedures planned: tbd  Taking Ca/D supplements: none listed  (recommended daily Ca dose = '1200mg'$  from supplements + diet)  Pt reported to be doing Tai Chi  Current medications with osteoporosis risk: none   Current meds with fall risk: none      A/Recommendations: Osteopenic with elevated FRAX score and recent fx.  No past medication treatment.  Pt qualifies and would likely benefit from bisphosphonate start,  IV formulation most efficient way of assuring dosage received.  Would need updated BMP and confirmation that no future planned dental procedures.       Other non-pharmacological recommendations: weight bearing exercise, get at least 3 servings of dairy a day (1 serving = 1 glass of milk, 1 slice of cheese, 1 cup cottage cheese or yogurt    Forwarding to PCP for review.    Varney Baas PharmD BCPS

## 2022-07-19 NOTE — Progress Notes (Signed)
I saw and evaluated the patient. I have reviewed the resident(s)'s/fellow(s)'s documentation and agree.

## 2022-07-23 NOTE — Progress Notes (Signed)
Per [facility]  Pt has [place] through 11/02/22 so estimated cost is $0.   Next steps as directed by Dr Ronalee Red PCP

## 2022-08-06 ENCOUNTER — Encounter (INDEPENDENT_AMBULATORY_CARE_PROVIDER_SITE_OTHER): Payer: Self-pay

## 2022-08-13 ENCOUNTER — Ambulatory Visit (INDEPENDENT_AMBULATORY_CARE_PROVIDER_SITE_OTHER): Payer: Medicare Other | Admitting: Acupunture

## 2022-09-05 NOTE — Progress Notes (Addendum)
SENIOR CARE CLINIC SUBSEQUENT CARE VISIT NOTE       Chief Complaint   Patient presents with    Follow-Up     Diarrhea    Ear Pain    Rectal Problem       ID/CC:  Wendy [name] is a 77 year old community-dwelling female being seen for diarrhea, L TMJ pain and other concerns. Last visit with me 06/28/22. PMH includes CAD with angina s/p CABG, nephrolithiasis with non-functioning R kidney, aortic stenosis, history of R MCA CVA, osteoporosis, memory difficulties.     HPI:  Baseline: lives alone at senior housing apartment ([facility])  Accompanied today by none  Interpreter or not: NO    Interim history: 07/17/22 geropsychiatry     Concerns today:   #1 Diarrhea this week for a couple days. Recalls initially at night, then stopped next day, then recurred. Couple times/day. No blood in BM. Small amount fecal incontinence at night. No abdominal cramping, fever/chills, nausea/vomiting. May have eaten chocolate that contained other ingredients that don't agree with her. Also wondering if may have hemorrhoid. Notices when wiping.    Interested in gut health, bought book.    #2 Continued L TMJ pain including w/chewing +clicking +locking. No dentist.    #3 CAD: chest pain with walking about same as before. Can walk about 250 steps before needs to stop and rest. Uses 4-wheeled walker.    #4 Noticing nosebleeds usually L side, intermittent. Showed me small amount blood on tissue toward end of visit.    Reports low energy.    Sees grandson 1-2x/week.    Geriatric ROS:   ADLs: independent per [facility] 07/10/84 note  IADLs: doesn't drive, otherwise independent per [facility] 06/28/22 note  Cognition: reported concerns previously  Mood: known depression   Mobility: 4-wheeled walker due to chest pain during exertion   Vision: eyeglasses  CFS-9: 4-5     Social History:  DPOA/Legal Next of Kin/main contact person/main contact #s: DPOA-HC daughter Andreas Blower, son Vahid available in EHR    Immunization History   Administered Date(s)  Administered    COVID-19 Moderna mRNA monovalent 12 yrs and older 07/03/2019, 08/11/2019, 04/14/2020, 10/04/2020    COVID-19 Pfizer mRNA bivalent 12 yrs and older 03/14/2021    Influenza quadrivalent MDCK PF (Flucelvax) 04/18/2019    Influenza quadrivalent PF 07/11/2017    Influenza quadrivalent adjuvanted (Fluad) 03/17/2020, 03/14/2021    Influenza trivalent high-dose 03/04/2018    Pneumococcal conjugate PCV13 (Prevnar 13) 05/21/2014    Pneumococcal polysaccharide PPSV23 (Pneumovax 23) 11/14/2012    Tdap 11/14/2012             PHYSICAL EXAMINATION:  BP (!) 128/52   Pulse 79   Temp 36.2 C (Temporal)   Wt (!) 101.2 kg (223 lb)   SpO2 95%   BMI 42.16 kg/m    Wt Readings from Last 3 Encounters:   09/06/22 (!) 101.2 kg (223 lb)   06/28/22 (!) 101.6 kg (224 lb)   05/31/22 (!) 100.2 kg (221 lb)     GENERAL: pleasant older female. Appearance: older-groomed,  stated age, sitting comfortably .  Head: tender to palpation @ L TMJ  CV: regular rate and rhythm, normal S1, S2, 4/6 holosystolic murmur throughout precordium, no rubs/S3S4  LUNGS: unlabored breathing, clear to auscultation bilaterally  ABDOMEN: +bowel sounds, soft, mild generalized tenderness  Rectal: no external hemorrhoid, mild tenderness on digital rectal examination with palpable internal hemorrhoid  Gait: 4-wheeled walker     Labs/Other  Results:  Orders Only on 06/28/2022   Component Date Value    Total Cholesterol 06/28/2022 245 (H)     Triglyceride 06/28/2022 234 (H)     HDL Cholesterol 06/28/2022 39 (L)     Non-HDL Cholesterol 06/28/2022 206 (H)     LDL Cholesterol, NIH Equ* 13/01/6577 162 (H)     Cholesterol/HDL Ratio 06/28/2022 6.3     Lipid Panel, Additional * 06/28/2022 (NOTE)     TSH with Reflexive Free * 06/28/2022 1.877     Sodium 06/28/2022 141     Potassium 06/28/2022 4.2     Chloride 06/28/2022 106     Carbon Dioxide, Total 06/28/2022 28     Anion Gap 06/28/2022 7     Glucose 06/28/2022 123     Urea Nitrogen 06/28/2022 16     Creatinine  06/28/2022 1.20 (H)     Calcium 06/28/2022 9.5     Calcium (Ionized Reflex * 06/28/2022 Not indicated.     eGFR by CKD-EPI 2021 06/28/2022 47 (L)     Parathyroid Hormone 06/28/2022 107 (H)              ASSESSMENT/PLAN:  1. Diarrhea, unspecified type: couple days this past week, no other red flag symptoms  - BMP w/Reflexive Ionized Ca; Future  - Hepatic Function Panel; Future  - CBC with Diff; Future  - Enteric Pathogens Panel; Future -- if symptoms persist    2. TMJ dysfunction: L TMJ  - Referral to Dentistry/Oral Med; Future [facility] dental clinic given referral, instructed to call for appointment (per [facility] plan website they are covered dental provider)    3. Coronary artery disease involving coronary bypass graft of native heart with angina pectoris (HCC)  4. Coronary artery disease of native artery of native heart with stable angina pectoris (HCC)  - BMP w/Reflexive Ionized Ca; Future  - TransTHORACIC echo (TTE) complete; Future  - Chest pain on exertion unchanged  - Prior PCI 11/18/19, 07/17/21, attempt 09/18/21  - Continue clopidogrel, isosorbide mononitrate, metoprolol ER  - Statin intolerance, previously referred to lipid clinic (patient has difficulty with coordinating multiple appointments and specialists)  - Cardiology follow up 11/2022 as scheduled     5. Osteoporosis without current pathological fracture, unspecified osteoporosis type  - Zoledronic acid outpatient therapy plan ordered, plan yearly for 3 years. Coordinate with next clinic appointment ~11/2022 okay. No planned dental surgery/procedures, but will be seeing outside dentist as above. Discussed common side effects.  - Appreciate clinical pharmacist recommendation  - BMP w/Reflexive Ionized Ca; Future  - Hepatic Function Panel; Future  - CBC with Diff; Future  - Next DEXA ~05/2024 or later    6. Aortic stenosis, mild  7. Aortic valve insufficiency, etiology of cardiac valve disease unspecified  - TransTHORACIC echo  (TTE) complete; Future follow up due    8. Stage 3a chronic kidney disease (HCC)  - BP control, regular monitoring renal function by me  - BMP w/Reflexive Ionized Ca; Future  - CBC with Diff; Future    9. Hyperparathyroidism (HCC)  - Previously seen by endocrinology  - BMP w/Reflexive Ionized Ca; Future    10. Essential hypertension  - Controlled, continue lisinopril, metoprolol ER, isosorbide mononitrate  - BMP w/Reflexive Ionized Ca; Future    11. Mixed hyperlipidemia  - Statin intolerance previously referred to lipid clinic (difficulty with managing multiple appointments)    12. Internal hemorrhoid  - Possible prolapsing, will monitor    13. Left-sided epistaxis  -  Note on apixaban  - CBC with Diff; Future    14. Non-functioning R kidney  - BMP w/Reflexive Ionized Ca; Future    Relevant Health Care Maintenance:  - Labs: diabetes screening last HbA1c 5.9% 05/31/22; lipids 06/28/22; TSH normal 06/28/22; 25-OH vitamin D normal 07/16/19; methylmalonic acid normal 06/22/21 (last vitamin B-12 220 04/06/21)  - Supplements: per EHR  - Immunizations: reports influenza and COVID-19 vaccines up-to-date  - Osteoporosis screening (DEXA): indicated N/A known osteoporosis (last DEXA 06/06/22) see above  - Abdominal aortic aneurysm screening: no AAA on 09/20/21 CTA abdomen/pelvis  - Colon cancer screening: indicated YES defer due to angina  - Mammogram/breast cancer screening: indicated YES previously ordered    Advance care planning/POLST: [facility] daughter Andreas Blower and son Vahid    Follow up TBD summer 2024 (I will be on leave for most of summer).              11/29/2022 2:00 PM (Arrive by 1:45 PM) Hunt Oris, MD [facility]    11/29/2022 2:00 PM (Arrive by 1:45 PM) [phone] CARE MANAGEMENT  [facility]            Visit start time: 1001  Visit end time: 1035    I spent a total of 45 minutes for the patient's care on the date of the service.        ADDENDUM 09/11/22   Orders Only on 09/06/22   1.  CBC with Diff   Result Value Ref Range    WBC 6.62 4.3 - 10.0 10*3/uL    RBC 4.94 3.80 - 5.00 10*6/uL    Hemoglobin 15.7 (H) 11.5 - 15.5 g/dL    Hematocrit 47 (H) 96.0 - 45.0 %    MCV 95 81 - 98 fL    MCH 31.8 27.3 - 33.6 pg    MCHC 33.5 32.2 - 36.5 g/dL    Platelet Count 454 098 - 400 10*3/uL    RDW-CV 13.7 11.0 - 14.5 %    % Neutrophils 72 %    % Lymphocytes 16 %    % Monocytes 8 %    % Eosinophils 2 %    % Basophils 1 %    % Immature Granulocytes 1 %    Neutrophils 4.78 1.80 - 7.00 10*3/uL    Absolute Lymphocyte Count 1.08 1.00 - 4.80 10*3/uL    Monocytes 0.52 0.00 - 0.80 10*3/uL    Absolute Eosinophil Count 0.16 0.00 - 0.50 10*3/uL    Basophils 0.05 0.00 - 0.20 10*3/uL    Immature Granulocytes 0.03 0.00 - 0.05 10*3/uL    Nucleated RBC 0.00 0.00 10*3/uL    % Nucleated RBC 0 %   2. Hepatic Function Panel   Result Value Ref Range    Albumin 4.4 3.5 - 5.2 g/dL    Protein (Total) 7.2 6.0 - 8.2 g/dL    Bilirubin (Total) 0.6 0.2 - 1.3 mg/dL    Bilirubin (Direct) 0.1 0.0 - 0.3 mg/dL    Alkaline Phosphatase (Total) 77 49 - 199 U/L    AST (GOT) 16 9 - 38 U/L    ALT (GPT) 15 7 - 33 U/L   3. BMP w/Reflexive Ionized Ca   Result Value Ref Range    Sodium 139 135 - 145 meq/L    Potassium 4.0 3.6 - 5.2 meq/L    Chloride 104 98 - 108 meq/L    Carbon Dioxide, Total 26 22 - 32 meq/L    Anion Gap  9 4 - 12    Glucose 94 62 - 125 mg/dL    Urea Nitrogen 14 8 - 21 mg/dL    Creatinine 1.61 (H) 0.38 - 1.02 mg/dL    Calcium 9.4 8.9 - 09.6 mg/dL    Calcium (Ionized Reflex Status) Not indicated.     eGFR by CKD-EPI 2021 49 (L) >59 mL/min/1.73_m2     Mildly elevated Hb/Hct, plan re-check w/future lab draw  Renal function CKD stage 3a stable  LFTs normal     MyChart result comment sent.

## 2022-09-06 ENCOUNTER — Ambulatory Visit: Payer: Medicare HMO | Attending: Internal Medicine | Admitting: Internal Medicine

## 2022-09-06 ENCOUNTER — Encounter (HOSPITAL_BASED_OUTPATIENT_CLINIC_OR_DEPARTMENT_OTHER): Payer: Self-pay | Admitting: Internal Medicine

## 2022-09-06 ENCOUNTER — Ambulatory Visit (HOSPITAL_BASED_OUTPATIENT_CLINIC_OR_DEPARTMENT_OTHER): Payer: Medicare HMO

## 2022-09-06 ENCOUNTER — Other Ambulatory Visit (HOSPITAL_BASED_OUTPATIENT_CLINIC_OR_DEPARTMENT_OTHER): Payer: Self-pay | Admitting: Internal Medicine

## 2022-09-06 VITALS — BP 128/52 | HR 79 | Temp 97.2°F | Wt 223.0 lb

## 2022-09-06 DIAGNOSIS — M810 Age-related osteoporosis without current pathological fracture: Secondary | ICD-10-CM

## 2022-09-06 DIAGNOSIS — R197 Diarrhea, unspecified: Secondary | ICD-10-CM | POA: Insufficient documentation

## 2022-09-06 DIAGNOSIS — E782 Mixed hyperlipidemia: Secondary | ICD-10-CM | POA: Insufficient documentation

## 2022-09-06 DIAGNOSIS — M26609 Unspecified temporomandibular joint disorder, unspecified side: Secondary | ICD-10-CM | POA: Insufficient documentation

## 2022-09-06 DIAGNOSIS — E213 Hyperparathyroidism, unspecified: Secondary | ICD-10-CM | POA: Insufficient documentation

## 2022-09-06 DIAGNOSIS — I129 Hypertensive chronic kidney disease with stage 1 through stage 4 chronic kidney disease, or unspecified chronic kidney disease: Secondary | ICD-10-CM

## 2022-09-06 DIAGNOSIS — K648 Other hemorrhoids: Secondary | ICD-10-CM | POA: Insufficient documentation

## 2022-09-06 DIAGNOSIS — N1831 Chronic kidney disease, stage 3a: Secondary | ICD-10-CM | POA: Insufficient documentation

## 2022-09-06 DIAGNOSIS — N289 Disorder of kidney and ureter, unspecified: Secondary | ICD-10-CM | POA: Insufficient documentation

## 2022-09-06 DIAGNOSIS — R040 Epistaxis: Secondary | ICD-10-CM | POA: Insufficient documentation

## 2022-09-06 DIAGNOSIS — I25118 Atherosclerotic heart disease of native coronary artery with other forms of angina pectoris: Secondary | ICD-10-CM | POA: Insufficient documentation

## 2022-09-06 DIAGNOSIS — I10 Essential (primary) hypertension: Secondary | ICD-10-CM

## 2022-09-06 DIAGNOSIS — I350 Nonrheumatic aortic (valve) stenosis: Secondary | ICD-10-CM | POA: Insufficient documentation

## 2022-09-06 DIAGNOSIS — I351 Nonrheumatic aortic (valve) insufficiency: Secondary | ICD-10-CM | POA: Insufficient documentation

## 2022-09-06 DIAGNOSIS — I25709 Atherosclerosis of coronary artery bypass graft(s), unspecified, with unspecified angina pectoris: Secondary | ICD-10-CM | POA: Insufficient documentation

## 2022-09-06 LAB — HEPATIC FUNCTION PANEL
ALT (GPT): 15 U/L (ref 7–33)
AST (GOT): 16 U/L (ref 9–38)
Albumin: 4.4 g/dL (ref 3.5–5.2)
Alkaline Phosphatase (Total): 77 U/L (ref 49–199)
Bilirubin (Direct): 0.1 mg/dL (ref 0.0–0.3)
Bilirubin (Total): 0.6 mg/dL (ref 0.2–1.3)
Protein (Total): 7.2 g/dL (ref 6.0–8.2)

## 2022-09-06 LAB — CBC, DIFF
% Basophils: 1 %
% Eosinophils: 2 %
% Immature Granulocytes: 1 %
% Lymphocytes: 16 %
% Monocytes: 8 %
% Neutrophils: 72 %
% Nucleated RBC: 0 %
Absolute Eosinophil Count: 0.16 10*3/uL (ref 0.00–0.50)
Absolute Lymphocyte Count: 1.08 10*3/uL (ref 1.00–4.80)
Basophils: 0.05 10*3/uL (ref 0.00–0.20)
Hematocrit: 47 % — ABNORMAL HIGH (ref 36.0–45.0)
Hemoglobin: 15.7 g/dL — ABNORMAL HIGH (ref 11.5–15.5)
Immature Granulocytes: 0.03 10*3/uL (ref 0.00–0.05)
MCH: 31.8 pg (ref 27.3–33.6)
MCHC: 33.5 g/dL (ref 32.2–36.5)
MCV: 95 fL (ref 81–98)
Monocytes: 0.52 10*3/uL (ref 0.00–0.80)
Neutrophils: 4.78 10*3/uL (ref 1.80–7.00)
Nucleated RBC: 0 10*3/uL
Platelet Count: 232 10*3/uL (ref 150–400)
RBC: 4.94 10*6/uL (ref 3.80–5.00)
RDW-CV: 13.7 % (ref 11.0–14.5)
WBC: 6.62 10*3/uL (ref 4.3–10.0)

## 2022-09-06 LAB — BMP WITH REFLEXIVE IONIZED CA
Anion Gap: 9 (ref 4–12)
Calcium: 9.4 mg/dL (ref 8.9–10.2)
Carbon Dioxide, Total: 26 meq/L (ref 22–32)
Chloride: 104 meq/L (ref 98–108)
Creatinine: 1.16 mg/dL — ABNORMAL HIGH (ref 0.38–1.02)
Glucose: 94 mg/dL (ref 62–125)
Potassium: 4 meq/L (ref 3.6–5.2)
Sodium: 139 meq/L (ref 135–145)
Urea Nitrogen: 14 mg/dL (ref 8–21)
eGFR by CKD-EPI 2021: 49 mL/min/{1.73_m2} — ABNORMAL LOW (ref >59)

## 2022-09-06 NOTE — Assessment & Plan Note (Signed)
Zoledronic acid yearly x3 doses plan start ~11/2022

## 2022-09-06 NOTE — Patient Instructions (Addendum)
Thank you for choosing [facility] for your care. You saw Lillard Anes, MD, MPH today at [facility]. Please call [phone] option #2 with any urgent questions or concerns.     Good to see you today!    Diarrhea: labs today  Stool test to bring back to lab if you're still having diarrhea    Left TMJ jaw pain: call dentist for appointment   [facility]  [address]  [place], [place] [address]  [phone]    Osteopenia low bone density: will order zoledronic acid IV infusion which is 1 time a year for 3 years. Will plan to coordinate with a future clinic appointment.  Will need updated labs 2 weeks before infusion    Aortic stenosis heart valve narrowing: echocardiogram (ultrasound test of heart) due to follow up, will plan to schedule for same day as cardiology follow up appointment in June

## 2022-09-11 ENCOUNTER — Telehealth (HOSPITAL_BASED_OUTPATIENT_CLINIC_OR_DEPARTMENT_OTHER): Payer: Self-pay | Admitting: Pharmacy

## 2022-09-11 NOTE — Telephone Encounter (Signed)
Telephone call to pt RE: osteoporosis treatment with Zometa.    Last seen by PCP on 09/06/22. Plan was to start yearly IV Zoledronic Acid x3 years. Infusion orders have been placed and pending authorization. Per prior investigation, anticipate coverage of infusion with $0 copay.    Attempted to call pt to discuss treatment and answer any med questions. Went straight to [employer]. Left VM with call back #      06/28/22 12:33 09/06/22 11:00   Sodium 141 139   Potassium 4.2 4.0   Chloride 106 104   Carbon Dioxide, Total 28 26   Anion Gap 7 9   Glucose 123 94   Urea Nitrogen 16 14   Creatinine 1.20 (H) 1.16 (H)   eGFR by CKD-EPI 2021 47 (L) 49 (L)   Calcium 9.5 9.4   Calcium (Ionized Reflex Status) Not indicated. Not indicated.     Estimated CrCl ~66 mL/min.    Serum calcium wnl. CrCl >35 mL/min therefore appropriate to move forward with bisphosphonate therapy.     Suzan Slick Laveda Norman, PharmD, BCACP  Clinical Pharmacist  [facility]

## 2022-09-14 ENCOUNTER — Telehealth (HOSPITAL_BASED_OUTPATIENT_CLINIC_OR_DEPARTMENT_OTHER): Payer: Self-pay | Admitting: Registered Nurse

## 2022-09-14 NOTE — Telephone Encounter (Signed)
Nurse Care Management Brief Note:  Patient enrolled & engaged in [facility] with RN care management and care coordination.    PCP: Kirstie Peri, MD    Contacted Via: Telephone    Location: N/A    The language the patient speaks: [place]   Interpreter present: None.    Summary:     Transthoracic echo (TTE) complete: Ordered by Dr. Lillard Anes on 09/06/22. Scheduled on 11/29/22. Confirmed the appointment with patient.     Dental appointment: Patient states she is doing fine except still has TMJ jaw pain. Has not scheduled a dental appointment. Advised patient to call to schedule an appointment with a dentist to evaluate the pain. She agreed.   [facility] [address]  [place], [place] [address]  [phone]    Next Follow up:   Will mail appointment itinerary to patient.

## 2022-09-15 ENCOUNTER — Encounter (HOSPITAL_BASED_OUTPATIENT_CLINIC_OR_DEPARTMENT_OTHER): Payer: Self-pay | Admitting: Internal Medicine

## 2022-09-25 NOTE — Telephone Encounter (Signed)
S/O: Daytime sleepiness. Previously seen by sleep medicine 03/24/21 recommended home sleep apnea testing, which wasn't done.    Previously recommended not to drive due to CAD.    A/P: Daytime sleepiness.    RN care manager please follow up with Wendy [name] regarding home sleep apnea testing, which would need to be scheduled by sleep medicine clinic.    I was out of office 4/8-4/12/24.    Thanks.

## 2022-09-26 NOTE — Telephone Encounter (Signed)
Nurse Care Management Brief Note:  Patient enrolled & engaged in [facility] with RN care management and care coordination.    PCP: Kirstie Peri, MD    Contacted Via: Telephone    Location: N/A    The language the patient speaks: [place]   Interpreter present: None.    Summary:     Sleep Medicine clinic: Had a Telemedicine visit with Dr. Felicie Morn for evaluation of OSA (obstructive sleep apnea) on 04/03/21. The plan was to have home sleep apnea testing, with f/u polysomnography. Follow up visit to discuss results. No future appointment. Spoke with patient regarding scheduling an appointment for home sleep apnea testing. Patient declined. She would like to speak her daughter about the test. She will call this RN CM when she is ready to schedule an appointment. This RN CM called patient today. A VM was left requesting a call back.     Assessment/Plan:  Did not reach patient. A VM was left. Pending response from patient.     Next Follow up:

## 2022-09-27 ENCOUNTER — Encounter (HOSPITAL_BASED_OUTPATIENT_CLINIC_OR_DEPARTMENT_OTHER): Payer: Self-pay

## 2022-09-27 ENCOUNTER — Telehealth (HOSPITAL_BASED_OUTPATIENT_CLINIC_OR_DEPARTMENT_OTHER): Payer: Self-pay

## 2022-09-27 NOTE — Telephone Encounter (Signed)
Open in error

## 2022-09-27 NOTE — Telephone Encounter (Signed)
Patient have an order for Zoledronic acid infusion order by Dr. Renaldo Reel. LVM for pt to call [facility] to schedule her infusion.

## 2022-10-01 ENCOUNTER — Encounter (HOSPITAL_BASED_OUTPATIENT_CLINIC_OR_DEPARTMENT_OTHER): Payer: Self-pay | Admitting: Internal Medicine

## 2022-10-01 DIAGNOSIS — R052 Subacute cough: Secondary | ICD-10-CM

## 2022-10-01 MED ORDER — CETIRIZINE HCL 10 MG OR TABS
10.0000 mg | ORAL_TABLET | Freq: Every day | ORAL | 3 refills | Status: AC
Start: 2022-10-01 — End: ?

## 2022-10-01 NOTE — Telephone Encounter (Signed)
Rx refilled. MyChart reply sent.

## 2022-10-03 NOTE — Telephone Encounter (Signed)
Attempted to call patient regarding scheduling an appointment for home sleep study again. Left another VM requesting a call back.

## 2022-10-10 NOTE — Telephone Encounter (Signed)
Noted, will encourage to complete sleep study in future.

## 2022-10-29 ENCOUNTER — Encounter (HOSPITAL_BASED_OUTPATIENT_CLINIC_OR_DEPARTMENT_OTHER): Payer: Self-pay | Admitting: Internal Medicine

## 2022-10-29 ENCOUNTER — Ambulatory Visit (HOSPITAL_BASED_OUTPATIENT_CLINIC_OR_DEPARTMENT_OTHER): Payer: Medicare HMO

## 2022-10-30 NOTE — Telephone Encounter (Signed)
Agree with RN advice for appointment. Thanks.

## 2022-11-01 ENCOUNTER — Telehealth (HOSPITAL_BASED_OUTPATIENT_CLINIC_OR_DEPARTMENT_OTHER): Payer: Self-pay | Admitting: Nursing

## 2022-11-01 NOTE — Telephone Encounter (Signed)
LVM for [facility]:    Sent message on 05/20 with appt options for this week. Dr. Renaldo Reel has opening this morning at 1030. Please call or message clinic as soon as possible if pt can make this appt. Otherwise, please call [phone] option 1 to schedule appt with first available provider for acute care visit.

## 2022-11-09 NOTE — Telephone Encounter (Signed)
Currently scheduled to see me 02/28/23. Okay to see available provider. I will be on leave for most of summer 2024, starting 11/19/22; my partners are covering for me.

## 2022-11-28 ENCOUNTER — Other Ambulatory Visit (HOSPITAL_BASED_OUTPATIENT_CLINIC_OR_DEPARTMENT_OTHER): Payer: Self-pay | Admitting: Internal Medicine

## 2022-11-28 DIAGNOSIS — I480 Paroxysmal atrial fibrillation: Secondary | ICD-10-CM

## 2022-11-29 ENCOUNTER — Ambulatory Visit (HOSPITAL_BASED_OUTPATIENT_CLINIC_OR_DEPARTMENT_OTHER): Payer: Medicare HMO

## 2022-11-29 ENCOUNTER — Ambulatory Visit (HOSPITAL_BASED_OUTPATIENT_CLINIC_OR_DEPARTMENT_OTHER): Payer: Medicare HMO | Admitting: Cardiovascular Disease

## 2022-11-29 ENCOUNTER — Telehealth (HOSPITAL_BASED_OUTPATIENT_CLINIC_OR_DEPARTMENT_OTHER): Payer: Self-pay | Admitting: Registered Nurse

## 2022-11-29 NOTE — Telephone Encounter (Signed)
Nurse Care Management Brief Note:  Patient enrolled & engaged in [facility] with RN care management and care coordination.    PCP: Kirstie Peri, MD    Contacted Via: Telephone    Summary:   -Patient was scheduled for Transthoracic echo (TTE) complete today but it was cancelled. Called patient to confirm the appointment scheduled with Dr. Shelle Iron for this afternoon. Patient requests to reschedule. The appointment is rescheduled on 03/21/23.     Assessment/Plan:  -The appointment with Dr. Shelle Iron rescheduled on 03/21/23.  -Patient plans to call back to reschedule TTE next week.   -Patient plans to call back to schedule an appointment for Zoledronic acid infusion in [facility] next week.     Next Follow up:   Will mail appointment itinerary to patient.

## 2022-11-29 NOTE — Progress Notes (Deleted)
Nurse Care Management Brief Note:  Patient enrolled & engaged in [facility] with RN care management and care coordination.    PCP: Kirstie Peri, MD    Contacted Via: In person    Location: [facility]/Senior care clinic    The language the patient speaks: [place]   Interpreter present: None.    Summary:   Patient is in clinic for a follow up visit with Dr. Shelle Iron  Accompanied by: None.  Mobility: Ambulating with an upright 4 wheels walker independently.    Transthoracic echo (TTE) complete: Ordered by Dr. Lillard Anes on 09/06/22. Scheduled on 11/29/22 which was cancelled. No future appointment.     Screening Mammogram: The order was placed by Dr. Lillard Anes on 01/25/22. Patient prefers to have it done at [facility]. The phone number [phone] given to patient x 2. She plans to call them to schedule an appointment.     Referral to Acupuncture/[facility]: The referral was placed by Dr. Lillard Anes on 01/25/22. Scheduled patient at [facility] on 08/13/22 which was canceled. No future appointment. They only see patient in the evening during weekday. Also see patient on Saturday from 8:00AM - 3PM. Patient needs to call her insurance to ensure acupuncture is covered.     Audiology clinic: Saw Dr. Leighton Ruff for a comprehensive hearing evaluation on 05/23/22. The plan was to 1) F/U in 1 year or sooner as needed for annual review to r/o progression of hearing loss.     BHIP: Had a Telemedicine visit with Alfonse Spruce MSW on 09/06/21. Next Telemedicine visit scheduled with Alfonse Spruce MSW on 09/20/21 which was cancelled. Patient is interested to join Dr. Jasmine December Romm's depression and anxiety support group if it is via zoom. A Telemedicine visit was scheduled with Dr. Jasmine December Room on 02/12/22. Patient was no show. No future appointment.     Cardiology: Saw Dr. Shelle Iron on 05/31/22. The plan was to follow up in 6 months. Scheduled on 11/29/22.    Dental care/[facility]: Saw Dr. Maryan Rued for dental pain on 11/27/22. Was referred to [facility] due to possible allergies and health history. Follow up visit scheduled on 12/27/22.   [facility]   [address]   [place], [place] [address]   [phone]   Fax: [phone]     [facility]: Had a Telemedicine visit with Dr. Elvis Coil on 08/10/21.  No future appointment.     Nutrition: Saw Williams Che RD for hyperlipidemia on 08/24/21.  The plan was to follow up on 09/01/21 to focus on prevention of diabetes and fiber. Had a Telemedicine follow up visit scheduled on 09/01/21 which was cancelled. Rescheduled on 03/05/22. Clydie Braun was unable to reach patient by phone or telemed. May reschedule.      Psychiatry/Senior care clinic: Saw Dr. Alexis Goodell for evaluation of depression x 1 week on 07/17/22.   The plan was:   - No medications indicated  - No acute psychotherapy needs; patient could pursue community psychotherapy if interested  - Pt can f/u with this clinic for worsening psychiatric symptoms    Senior care clinic: Saw Dr. Lillard Anes on 09/06/22. RTC in 3 months. Follow up visit scheduled on 02/28/23.  After visit instructions on 09/06/22:  Diarrhea: labs today  Stool test to bring back to lab if you're still having diarrhea     Left TMJ jaw pain: call dentist for appointment  [facility] [address]  [place], [place] [address]  [phone]     Osteopenia low bone density: will order zoledronic acid IV infusion which is 1 time a year for 3 years. Will plan to coordinate with a future clinic appointment.  Will need updated labs 2 weeks before infusion     Aortic stenosis heart valve narrowing: echocardiogram (ultrasound test of heart) due to follow up, will plan to schedule for same day as cardiology follow up appointment in June    Sleep Medicine clinic: Had a Telemedicine visit with Dr. Felicie Morn for evaluation of OSA (obstructive  sleep apnea) on 04/03/21. The plan was to have home sleep apnea testing, with f/u polysomnography. Follow up visit to discuss results. No future appointment. Spoke with patient regarding scheduling an appointment for home sleep apnea testing. Patient declined. She would like to speak her daughter about the test. She will call this RN CM when she is ready to schedule an appointment. Patient does not want to do a sleep study per MyChart's message on 10/09/22.     Stroke clinic: Saw Dr. Floyde Parkins to follow up right temporoparietal ischemic stroke etiology cardioembolic on 05/15/22. Return if symptoms worsen or fail to improve.     Hypertension:   ***  Medications management:  Meds refill:   Pharmacy: [facility]  zoledronic acid IV infusion yearly x 3: Ordered was placed by Dr. Lillard Anes on 09/06/22. Coordinate with upcoming clinic visit 11/29/22.     Immunizations:   Covid: Per patient, she had a Covid vaccine in early Nov, 2023 from where she lives.   Influenza: Per patient, she had an Influenza vaccine in early Nov, 2023 from where she lives.     PHQ-9:   Date: 06/28/22  Total score: 25. Dr. Lillard Anes informed. Saw Dr. Alexis Goodell on 07/17/22. The plan was: No medications indicated, No acute psychotherapy needs; patient could pursue community psychotherapy if interested and Pt can f/u with this clinic for worsening psychiatric symptoms    Myrtis Ser Index of Independence in Activities of Daily Living:   Date: 06/28/22  Total Points: 5 (Incontinence of urine. Wearing attends)  6=High (patient independent)  0=Low (patient very dependent)    POLST: No record in chart.  DPOA for health care: An unsigned DPOA for health care scanned into chart.     Communication: By phone or text message.     Living situation: Lives alone in a senior housing.     Preference: Appointment prefer after 10AM. Prefer on Wed or Fri.    Support: Daughter, Andreas Blower (phone #: [phone]), Lourena Simmonds (phone # [phone]). Patient gave  verbal consent to contact daughter and son about patient's care.     Transportation: [employer].    Assessment/Plan:   Dental/[facility]  -Referral to Acupuncture/[facility]  -Lab  -TTE  -Mammogram   -Audiology clinic f/u due 05/24/23  -Home sleep apnea testing, with f/u polysomnography: Patient declined.  -Sleep medicine clinic f/u after sleep study.  -Zoledronic acid IV infusion yearly  -PHQ-9 due  -KATZ  due 12/27/22  -POLST    Will continue to provide support in RN Care management and care coordination.     Next Follow up:

## 2022-11-30 MED ORDER — ELIQUIS 5 MG OR TABS
ORAL_TABLET | ORAL | 2 refills | Status: AC
Start: 2022-11-30 — End: ?

## 2022-12-07 ENCOUNTER — Telehealth (HOSPITAL_BASED_OUTPATIENT_CLINIC_OR_DEPARTMENT_OTHER): Payer: Self-pay | Admitting: Registered Nurse

## 2022-12-07 NOTE — Telephone Encounter (Signed)
Nurse Care Management Brief Note:  Patient enrolled & engaged in [facility] with RN care management and care coordination.    PCP: Kirstie Peri, MD    Contacted Via: Telephone    Summary:   Called patient to discuss below. A VM was left requesting a call back.     Transthoracic echo (TTE) complete: Ordered by Dr. Lillard Anes on 09/06/22. Scheduled on 11/29/22 which was cancelled. No future appointment.     Lab: Ordered by Dr. Lillard Anes on 09/06/22. Has not completed.     Medication management:  Zoledronic acid IV infusion yearly: Ordered was placed by Dr. Lillard Anes on 09/06/22. [facility] attempted to contact patient x 1. No appointment has been made.

## 2022-12-11 NOTE — Telephone Encounter (Signed)
Attempted to call patient. A VM was left requesting a call back.  Attempted to call patient's  son, Alice Reichert. A VM was left requesting a call back.

## 2022-12-18 NOTE — Telephone Encounter (Signed)
Spoke with patient briefly over the phone about scheduling appointments for echogram and Zoledronic acid IV infusion. She was ready to leave home for shopping. Will call back to discuss the scheduling today.

## 2023-01-03 NOTE — Telephone Encounter (Signed)
Nurse Care Management Brief Note:  Patient enrolled & engaged in [facility] with RN care management and care coordination.    PCP: Kirstie Peri, MD    Contacted Via: Telephone    Summary:   Called patient to discuss below. A VM was left requesting a call back.     Transthoracic echo (TTE) complete: Ordered by Dr. Lillard Anes on 09/06/22. Scheduled on 11/29/22 which was cancelled. No future appointment.     Lab: Ordered by Dr. Lillard Anes on 09/06/22. Has not completed.     Medication management:  Zoledronic acid IV infusion yearly: Ordered was placed by Dr. Lillard Anes on 09/06/22. [facility] attempted to contact patient x 1. No appointment has been made.

## 2023-01-24 LAB — OTHER LAB ORDER
Hemoglobin A1C: 6.2 % — ABNORMAL HIGH (ref 4.8–5.6)
Mean Blood Glucose Estimate: 131 mg/dL

## 2023-01-28 ENCOUNTER — Other Ambulatory Visit: Payer: Self-pay

## 2023-01-28 ENCOUNTER — Other Ambulatory Visit (HOSPITAL_BASED_OUTPATIENT_CLINIC_OR_DEPARTMENT_OTHER): Payer: Self-pay | Admitting: Internal Medicine

## 2023-01-28 DIAGNOSIS — R413 Other amnesia: Secondary | ICD-10-CM

## 2023-01-30 MED ORDER — VITAMIN B-12 1000 MCG OR TABS
ORAL_TABLET | ORAL | 2 refills | Status: AC
Start: 2023-01-30 — End: ?

## 2023-02-04 ENCOUNTER — Encounter (HOSPITAL_BASED_OUTPATIENT_CLINIC_OR_DEPARTMENT_OTHER): Payer: Self-pay | Admitting: Internal Medicine

## 2023-02-04 ENCOUNTER — Telehealth (HOSPITAL_BASED_OUTPATIENT_CLINIC_OR_DEPARTMENT_OTHER): Payer: Self-pay | Admitting: Registered Nurse

## 2023-02-04 ENCOUNTER — Other Ambulatory Visit (HOSPITAL_BASED_OUTPATIENT_CLINIC_OR_DEPARTMENT_OTHER): Payer: Self-pay | Admitting: Pharmacy

## 2023-02-04 NOTE — Telephone Encounter (Signed)
Nurse Care Management Brief Note:  Patient enrolled & engaged in [facility] with RN care management and care coordination.    PCP: Kirstie Peri, MD    Contacted Via: Telephone    Summary:   Called patient. Spoke with both patient and her caregiver, Mardene Celeste. Confirmed that patient has enrolled in [facility] program. The appointment with Dr. Lillard Anes in September is cancelled. Patient confirmed that she will see another cardiologist through PACE. The appointment with Dr. Shelle Iron cancelled. Discharged patient from RN CM.     Will route this message to Dr. Lillard Anes.

## 2023-02-07 ENCOUNTER — Emergency Department: Payer: Self-pay

## 2023-02-28 ENCOUNTER — Ambulatory Visit: Payer: Medicare Other | Admitting: Internal Medicine

## 2023-02-28 ENCOUNTER — Ambulatory Visit (HOSPITAL_BASED_OUTPATIENT_CLINIC_OR_DEPARTMENT_OTHER): Payer: Medicare Other

## 2023-03-21 ENCOUNTER — Ambulatory Visit (HOSPITAL_BASED_OUTPATIENT_CLINIC_OR_DEPARTMENT_OTHER): Payer: Medicare Other

## 2023-03-21 ENCOUNTER — Ambulatory Visit: Payer: Medicare Other | Admitting: Cardiovascular Disease

## 2023-03-29 ENCOUNTER — Telehealth (HOSPITAL_BASED_OUTPATIENT_CLINIC_OR_DEPARTMENT_OTHER): Payer: Self-pay | Admitting: Cardiovascular Disease

## 2023-03-29 NOTE — Telephone Encounter (Signed)
Attempt # 4, left message with patient to call [phone] to schedule appointment with our CC Team. Informed patient their referral will be closed at this time and if they want to pursue treatment with Dr Elvis Coil they can call the clinic and schedule an appointment. Letter to be mailed  to patient's address in chart and to referring provider.    Terance Hart  [facility] Complex Coron[facility]   [facility]

## 2023-08-03 LAB — OTHER LAB ORDER
Hemoglobin A1C: 6.2 % — ABNORMAL HIGH (ref 4.8–5.6)
Mean Blood Glucose Estimate: 131 mg/dL

## 2023-10-07 ENCOUNTER — Emergency Department: Payer: Self-pay

## 2023-12-29 LAB — OTHER LAB ORDER
Hemoglobin A1C: 6.6 % — ABNORMAL HIGH (ref 4.8–5.6)
Mean Blood Glucose Estimate: 143 mg/dL

## 2024-01-06 ENCOUNTER — Inpatient Hospital Stay: Payer: Self-pay
